# Patient Record
Sex: Female | Born: 1980 | Race: Black or African American | Hispanic: No | Marital: Married | Smoking: Never smoker
Health system: Southern US, Community
[De-identification: ages and names within clinical notes are randomized; demographics above are authoritative.]

## PROBLEM LIST (undated history)

## (undated) DIAGNOSIS — I10 Essential (primary) hypertension: Secondary | ICD-10-CM

## (undated) DIAGNOSIS — Z6841 Body Mass Index (BMI) 40.0 and over, adult: Secondary | ICD-10-CM

## (undated) DIAGNOSIS — Z8659 Personal history of other mental and behavioral disorders: Secondary | ICD-10-CM

## (undated) DIAGNOSIS — C8590 Non-Hodgkin lymphoma, unspecified, unspecified site: Secondary | ICD-10-CM

## (undated) DIAGNOSIS — T7840XA Allergy, unspecified, initial encounter: Secondary | ICD-10-CM

## (undated) DIAGNOSIS — Z789 Other specified health status: Secondary | ICD-10-CM

## (undated) DIAGNOSIS — D649 Anemia, unspecified: Secondary | ICD-10-CM

## (undated) DIAGNOSIS — F419 Anxiety disorder, unspecified: Secondary | ICD-10-CM

## (undated) DIAGNOSIS — R590 Localized enlarged lymph nodes: Secondary | ICD-10-CM

## (undated) DIAGNOSIS — R0989 Other specified symptoms and signs involving the circulatory and respiratory systems: Secondary | ICD-10-CM

## (undated) DIAGNOSIS — J302 Other seasonal allergic rhinitis: Secondary | ICD-10-CM

## (undated) DIAGNOSIS — F32A Depression, unspecified: Secondary | ICD-10-CM

## (undated) DIAGNOSIS — Z8489 Family history of other specified conditions: Secondary | ICD-10-CM

## (undated) HISTORY — DX: Depression, unspecified: F32.A

## (undated) HISTORY — DX: Personal history of other mental and behavioral disorders: Z86.59

## (undated) HISTORY — DX: Essential (primary) hypertension: I10

## (undated) HISTORY — DX: Anxiety disorder, unspecified: F41.9

## (undated) HISTORY — PX: TUBAL LIGATION: SHX77

## (undated) HISTORY — DX: Allergy, unspecified, initial encounter: T78.40XA

## (undated) HISTORY — DX: Anemia, unspecified: D64.9

---

## 2003-03-16 ENCOUNTER — Emergency Department (HOSPITAL_COMMUNITY): Admission: EM | Admit: 2003-03-16 | Discharge: 2003-03-16 | Payer: Self-pay | Admitting: Emergency Medicine

## 2003-03-16 ENCOUNTER — Encounter: Payer: Self-pay | Admitting: Emergency Medicine

## 2004-01-17 ENCOUNTER — Emergency Department (HOSPITAL_COMMUNITY): Admission: EM | Admit: 2004-01-17 | Discharge: 2004-01-17 | Payer: Self-pay | Admitting: Emergency Medicine

## 2004-04-11 ENCOUNTER — Other Ambulatory Visit: Admission: RE | Admit: 2004-04-11 | Discharge: 2004-04-11 | Payer: Self-pay | Admitting: Obstetrics and Gynecology

## 2004-05-04 ENCOUNTER — Ambulatory Visit (HOSPITAL_COMMUNITY): Admission: RE | Admit: 2004-05-04 | Discharge: 2004-05-04 | Payer: Self-pay | Admitting: Obstetrics and Gynecology

## 2004-06-09 ENCOUNTER — Inpatient Hospital Stay (HOSPITAL_COMMUNITY): Admission: AD | Admit: 2004-06-09 | Discharge: 2004-06-09 | Payer: Self-pay | Admitting: Obstetrics and Gynecology

## 2004-07-06 ENCOUNTER — Inpatient Hospital Stay (HOSPITAL_COMMUNITY): Admission: AD | Admit: 2004-07-06 | Discharge: 2004-07-06 | Payer: Self-pay | Admitting: Obstetrics and Gynecology

## 2004-08-03 ENCOUNTER — Inpatient Hospital Stay (HOSPITAL_COMMUNITY): Admission: AD | Admit: 2004-08-03 | Discharge: 2004-08-03 | Payer: Self-pay | Admitting: Obstetrics and Gynecology

## 2004-08-15 ENCOUNTER — Inpatient Hospital Stay (HOSPITAL_COMMUNITY): Admission: AD | Admit: 2004-08-15 | Discharge: 2004-08-17 | Payer: Self-pay | Admitting: Obstetrics and Gynecology

## 2004-09-13 ENCOUNTER — Other Ambulatory Visit: Admission: RE | Admit: 2004-09-13 | Discharge: 2004-09-13 | Payer: Self-pay | Admitting: Obstetrics and Gynecology

## 2004-11-30 ENCOUNTER — Emergency Department (HOSPITAL_COMMUNITY): Admission: EM | Admit: 2004-11-30 | Discharge: 2004-11-30 | Payer: Self-pay | Admitting: Emergency Medicine

## 2005-04-13 ENCOUNTER — Inpatient Hospital Stay (HOSPITAL_COMMUNITY): Admission: AD | Admit: 2005-04-13 | Discharge: 2005-04-14 | Payer: Self-pay | Admitting: Family Medicine

## 2005-08-02 ENCOUNTER — Other Ambulatory Visit: Admission: RE | Admit: 2005-08-02 | Discharge: 2005-08-02 | Payer: Self-pay | Admitting: Obstetrics and Gynecology

## 2005-11-16 ENCOUNTER — Ambulatory Visit (HOSPITAL_COMMUNITY): Admission: RE | Admit: 2005-11-16 | Discharge: 2005-11-16 | Payer: Self-pay | Admitting: Obstetrics and Gynecology

## 2006-08-12 ENCOUNTER — Ambulatory Visit (HOSPITAL_BASED_OUTPATIENT_CLINIC_OR_DEPARTMENT_OTHER): Admission: RE | Admit: 2006-08-12 | Discharge: 2006-08-12 | Payer: Self-pay | Admitting: Obstetrics and Gynecology

## 2006-10-11 ENCOUNTER — Other Ambulatory Visit: Admission: RE | Admit: 2006-10-11 | Discharge: 2006-10-11 | Payer: Self-pay | Admitting: Obstetrics & Gynecology

## 2007-10-14 ENCOUNTER — Other Ambulatory Visit: Admission: RE | Admit: 2007-10-14 | Discharge: 2007-10-14 | Payer: Self-pay | Admitting: Obstetrics and Gynecology

## 2008-10-13 ENCOUNTER — Other Ambulatory Visit: Admission: RE | Admit: 2008-10-13 | Discharge: 2008-10-13 | Payer: Self-pay | Admitting: Obstetrics and Gynecology

## 2010-11-10 NOTE — Op Note (Signed)
NAME:  Bonnie Larsen, GUNNELLS NO.:  [id number]   MEDICAL RECORD NO.:  [id number]          PATIENT TYPE:  AMB   LOCATION:  [facility]                         FACILITY:  [facility]   PHYSICIAN:  Cynthia P. Romine, M.D.DATE OF BIRTH:  15-Aug-1980   DATE OF PROCEDURE:  08/12/2006  DATE OF DISCHARGE:                               OPERATIVE REPORT   PREOPERATIVE DIAGNOSIS:  Desires attempt at permanent surgical  sterilization.   POSTOPERATIVE DIAGNOSIS:  Desires attempt at permanent surgical  sterilization.   PROCEDURE:  Falope ring laparoscopic tubal sterilization procedure.   SURGEON:  Dr. Meredeth Ide   ANESTHESIA:  General endotracheal.   ESTIMATED BLOOD LOSS:  Minimal.   COMPLICATIONS:  None.   PROCEDURE:  The patient was taken to the operating room and after the  induction of adequate general endotracheal anesthesia, was placed in the  low dorsal lithotomy position and prepped and draped in the usual  fashion.  A Hulka uterine manipulator was placed.  The bladder was  drained with a red rubber catheter.  Attention was next turned to the  abdomen.  A small subumbilical incision was made; a Veress needle was  inserted in the peritoneal space.  Proper placement was tested by noting  a negative aspirate and free flow of the Veress needle again with a  negative aspirate and then by noting the response of saline placed at  the hub of the Veress needle's negative pressure as the abdominal wall  was elevated.  Pneumoperitoneum was created with the automatic  insufflator to 2 liters of CO2.  A disposable 10/11 mm trocar was then  inserted and its proper placement noted with the laparoscope.  The  pelvic anatomy was inspected.  The uterus was of a normal size, shape  and contour.  The ovaries and tubes were freely mobile and were normal.  The midline suprapubic 8 mm incision was made and an 8 mm trocar was  inserted under direct visualization and the Falope ring applier was  introduced.  The Falope ring was placed on the patient's right fallopian  tube after having elevated it and tracing it to its fimbriated end and  as this portion was elevated, a Falope ring was placed.  A good knuckle  of tube was noted to be contained within the ring.  No bleeding was  encountered.  The procedure was repeated on the patient's left,  identifying the tube and tracing it to its fimbriated end.  A management  portion was elevated and again a Falope ring was placed.  A good knuckle  of tube was noted to be contained within the ring and no bleeding was  noted.  Photographic documentation was taken.  The applier was removed  from the abdomen.  The sleeves were removed.  The pneumoperitoneum was  allowed to escape.  The  incisions were closed subcuticularly with 4-0 Vicryl Rapide and  Dermabond was placed on the skin, instruments removed from the vagina,  and the procedure was terminated.  The patient tolerated it well and  went in satisfactory condition to postanesthesia recovery.  Cynthia P. Romine, M.D.  Electronically Signed     CPR/MEDQ  D:  08/12/2006  T:  08/12/2006  Job:  [id number]

## 2011-05-06 ENCOUNTER — Emergency Department (INDEPENDENT_AMBULATORY_CARE_PROVIDER_SITE_OTHER)
Admission: EM | Admit: 2011-05-06 | Discharge: 2011-05-06 | Disposition: A | Discharge status: Home / Self Care | Payer: 59 | Source: Home / Self Care | Attending: Family Medicine | Admitting: Family Medicine

## 2011-05-06 ENCOUNTER — Encounter: Payer: Self-pay | Admitting: *Deleted

## 2011-05-06 DIAGNOSIS — J069 Acute upper respiratory infection, unspecified: Secondary | ICD-10-CM

## 2011-05-06 DIAGNOSIS — J310 Chronic rhinitis: Secondary | ICD-10-CM

## 2011-05-06 NOTE — ED Provider Notes (Signed)
History     CSN: [id number] Arrival date & time: 05/06/2011  7:01 PM   First MD Initiated Contact with Patient 05/06/11 1805      Chief Complaint  Patient presents with  . Nasal Congestion    pt with onset of sinus congestion and cough x 2 weeks seen and treated own md thursday - treated for sinus infection and bronchitis - taking meds as directed friday night onset of neck and back pain   . Cough  . Neck Pain  . Back Pain    (Consider location/radiation/quality/duration/timing/severity/associated sxs/prior treatment) Patient is a 30 y.o. female presenting with cough, neck pain, and back pain. The history is provided by the patient.  Cough This is a new problem. The current episode started more than 2 days ago. The problem occurs constantly. The cough is productive of sputum. Associated symptoms include chest pain, rhinorrhea, myalgias and shortness of breath. She has tried an opioid for the symptoms. The treatment provided mild relief.  Neck Pain  Associated symptoms include chest pain.  Back Pain  Associated symptoms include chest pain.    No past medical history on file.  No past surgical history on file.  No family history on file.  History  Substance Use Topics  . Smoking status: Not on file  . Smokeless tobacco: Not on file  . Alcohol Use: Not on file    OB History    Grav Para Term Preterm Abortions TAB SAB Ect Mult Living                  Review of Systems  HENT: Positive for rhinorrhea and neck pain.   Respiratory: Positive for cough and shortness of breath.   Cardiovascular: Positive for chest pain.  Gastrointestinal: Negative.   Genitourinary: Negative.   Musculoskeletal: Positive for myalgias, back pain and arthralgias.  Neurological: Negative.     Allergies  Review of patient's allergies indicates no known allergies.  Home Medications   Current Outpatient Rx  Name Route Sig Dispense Refill  . ALBUTEROL SULFATE HFA 108 (90 BASE) MCG/ACT IN  AERS Inhalation Inhale 2 puffs into the lungs every 6 (six) hours as needed.      . AMOXICILLIN 500 MG PO TABS Oral Take 500 mg by mouth 2 (two) times daily.        BP 117/78  Pulse 113  Temp(Src) 99.1 F (37.3 C) (Oral)  Resp 19  SpO2 99%  Physical Exam  Constitutional: She appears well-developed and well-nourished.  HENT:  Head: Normocephalic and atraumatic.  Right Ear: Tympanic membrane normal.  Left Ear: Tympanic membrane normal.  Mouth/Throat: Oropharynx is clear and moist and mucous membranes are normal.  Eyes: EOM are normal.  Cardiovascular: Normal rate and regular rhythm.   Pulmonary/Chest: Effort normal and breath sounds normal.    ED Course  Procedures (including critical care time)  Labs Reviewed - No data to display No results found.   No diagnosis found.    MDM          Richardo Priest, MD 05/06/11 2005

## 2012-05-29 ENCOUNTER — Emergency Department (HOSPITAL_COMMUNITY): Payer: 59

## 2012-05-29 ENCOUNTER — Encounter (HOSPITAL_COMMUNITY): Payer: Self-pay

## 2012-05-29 ENCOUNTER — Emergency Department (HOSPITAL_COMMUNITY)
Admission: EM | Admit: 2012-05-29 | Discharge: 2012-05-29 | Disposition: A | Discharge status: Home / Self Care | Payer: 59 | Attending: Emergency Medicine | Admitting: Emergency Medicine

## 2012-05-29 DIAGNOSIS — L0201 Cutaneous abscess of face: Secondary | ICD-10-CM | POA: Insufficient documentation

## 2012-05-29 DIAGNOSIS — T7840XA Allergy, unspecified, initial encounter: Secondary | ICD-10-CM

## 2012-05-29 DIAGNOSIS — L0390 Cellulitis, unspecified: Secondary | ICD-10-CM

## 2012-05-29 DIAGNOSIS — K047 Periapical abscess without sinus: Secondary | ICD-10-CM

## 2012-05-29 DIAGNOSIS — L03211 Cellulitis of face: Secondary | ICD-10-CM | POA: Insufficient documentation

## 2012-05-29 DIAGNOSIS — I10 Essential (primary) hypertension: Secondary | ICD-10-CM | POA: Insufficient documentation

## 2012-05-29 LAB — POCT I-STAT, CHEM 8
BUN: 10 mg/dL (ref 6–23)
Calcium, Ion: 1.19 mmol/L (ref 1.12–1.23)
Chloride: 102 mEq/L (ref 96–112)
Creatinine, Ser: 1.2 mg/dL — ABNORMAL HIGH (ref 0.50–1.10)
Glucose, Bld: 92 mg/dL (ref 70–99)
HCT: 48 % — ABNORMAL HIGH (ref 36.0–46.0)
Hemoglobin: 16.3 g/dL — ABNORMAL HIGH (ref 12.0–15.0)
Potassium: 3.7 mEq/L (ref 3.5–5.1)
Sodium: 140 mEq/L (ref 135–145)
TCO2: 26 mmol/L (ref 0–100)

## 2012-05-29 MED ORDER — DIPHENHYDRAMINE HCL 50 MG/ML IJ SOLN
50.0000 mg | Freq: Once | INTRAMUSCULAR | Status: AC
  Administered 2012-05-29: 50 mg via INTRAVENOUS
  Filled 2012-05-29: qty 1

## 2012-05-29 MED ORDER — EPINEPHRINE 0.3 MG/0.3ML IJ DEVI
0.3000 mg | Freq: Once | INTRAMUSCULAR | Status: AC
  Administered 2012-05-29: 0.3 mg via INTRAMUSCULAR
  Filled 2012-05-29: qty 0.3

## 2012-05-29 MED ORDER — FAMOTIDINE 20 MG PO TABS
20.0000 mg | ORAL_TABLET | Freq: Two times a day (BID) | ORAL | Status: DC
Start: 2012-05-29 — End: 2015-09-20

## 2012-05-29 MED ORDER — PREDNISONE 20 MG PO TABS: 40.0000 mg | ORAL_TABLET | Freq: Every day | ORAL | Status: DC

## 2012-05-29 MED ORDER — SODIUM CHLORIDE 0.9 % IV SOLN
Freq: Once | INTRAVENOUS | Status: AC
  Administered 2012-05-29: 14:00:00 via INTRAVENOUS

## 2012-05-29 MED ORDER — LORAZEPAM 2 MG/ML IJ SOLN
1.0000 mg | Freq: Once | INTRAMUSCULAR | Status: AC
  Administered 2012-05-29: 0.5 mg via INTRAVENOUS
  Filled 2012-05-29: qty 1

## 2012-05-29 MED ORDER — DIPHENHYDRAMINE HCL 50 MG/ML IJ SOLN
25.0000 mg | Freq: Once | INTRAMUSCULAR | Status: AC
  Administered 2012-05-29: 25 mg via INTRAVENOUS
  Filled 2012-05-29: qty 1

## 2012-05-29 MED ORDER — CLINDAMYCIN PHOSPHATE 900 MG/50ML IV SOLN
900.0000 mg | Freq: Once | INTRAVENOUS | Status: AC
  Administered 2012-05-29: 900 mg via INTRAVENOUS
  Filled 2012-05-29: qty 50

## 2012-05-29 MED ORDER — METHYLPREDNISOLONE SODIUM SUCC 125 MG IJ SOLR
125.0000 mg | Freq: Once | INTRAMUSCULAR | Status: AC
  Administered 2012-05-29: 125 mg via INTRAVENOUS
  Filled 2012-05-29: qty 2

## 2012-05-29 MED ORDER — IOHEXOL 300 MG/ML  SOLN
80.0000 mL | Freq: Once | INTRAMUSCULAR | Status: AC | PRN
  Administered 2012-05-29: 80 mL via INTRAVENOUS

## 2012-05-29 MED ORDER — CLINDAMYCIN HCL 150 MG PO CAPS: 150.0000 mg | ORAL_CAPSULE | Freq: Four times a day (QID) | ORAL | Status: DC

## 2012-05-29 MED ORDER — FAMOTIDINE IN NACL 20-0.9 MG/50ML-% IV SOLN
20.0000 mg | Freq: Once | INTRAVENOUS | Status: AC
  Administered 2012-05-29: 20 mg via INTRAVENOUS
  Filled 2012-05-29: qty 50

## 2012-05-29 MED ORDER — AMOXICILLIN-POT CLAVULANATE 875-125 MG PO TABS: 1.0000 | ORAL_TABLET | Freq: Two times a day (BID) | ORAL | Status: DC

## 2012-05-29 MED ORDER — FENTANYL CITRATE 0.05 MG/ML IJ SOLN
50.0000 ug | Freq: Once | INTRAMUSCULAR | Status: AC
  Administered 2012-05-29: 50 ug via INTRAVENOUS
  Filled 2012-05-29: qty 2

## 2012-05-29 MED ORDER — MORPHINE SULFATE 4 MG/ML IJ SOLN
4.0000 mg | Freq: Once | INTRAMUSCULAR | Status: AC
  Administered 2012-05-29: 4 mg via INTRAVENOUS
  Filled 2012-05-29: qty 1

## 2012-05-29 MED ORDER — HYDROCODONE-ACETAMINOPHEN 5-325 MG PO TABS: ORAL_TABLET | ORAL | Status: DC

## 2012-05-29 MED ORDER — DIPHENHYDRAMINE HCL 25 MG PO TABS: 25.0000 mg | ORAL_TABLET | Freq: Four times a day (QID) | ORAL | Status: DC | PRN

## 2012-05-29 MED ORDER — EPINEPHRINE 0.3 MG/0.3ML IJ DEVI
INTRAMUSCULAR | Status: AC
  Filled 2012-05-29: qty 0.3

## 2012-05-29 NOTE — ED Notes (Signed)
Facial swelling has come down significantly and pt is sleeping comfortably. Family at bedside.

## 2012-05-29 NOTE — ED Provider Notes (Signed)
Pt placed in CDU awaiting a consult with ENT (Dr. Maurice March) for dental/facial abscess verified on CT scan.  Plan: Establish an appointment for her with Dr Maurice March at ENT and then d/c home with clinda and pain meds.  BP 116/76  Pulse 107  Temp 99.1 F (37.3 C) (Oral)  Resp 18  SpO2 100%  LMP 05/22/2012   On exam: hemodynamically stable, NAD, heart w/ RRR, lungs CTAB, Chest & abd non-tender, no peripheral edema or calf tenderness.  Pt resting comfortably at this time.   5:35 PM Attempted to contact Dr Bonnetta Barry office for a f/u appointment, but the office is closed.  I discussed this with the patient and the importance of her prompt F/U.     6:55 PM Called to patient room with concerns for allergic reaction.  Pt with significant bilateral facial edema, mild erythema of the skin, c/o itching and throat closing.  Pt also with h/o anxiety attacks. Lungs CTA bilaterally, not hypotensive, tachycardic to 130.  Pt given Epi 0.3mg  SQ, bendaryl 50mg  IV, Solumedrol 125mg  IV, Pepcid 20mg  IV.  Pt with decreasing symptoms.  Pt placed on oxygen 2L via [place]; oxygen saturations remained at 100% throughout event.     7:05 PM  Pt with significant anxiety throughout event and persisting after swelling of the face began to resolve.  Pt given Ativan 0.5 mg.  BP prior to Ativan administration 133/79, HR 125, O2 100% on 2L [place].     Swelling of the face resolving.  Pt reports significant decrease in her throat swelling.     10:09 PM Patient resting comfortably without evidence of return allergic reaction.  We'll contact Dr. Maurice March will not evaluate in the ED, but will have the pt follow-up with in the office tomorrow.  We will hold her for the 4 hours of observation after the allergic reaction and then d/c home.    11:13 PM Patient with mild swelling returning around her eyes however no swelling of the lips, other areas of the face or throat. Patient states she is breathing without difficulty and swallowing without  difficulty. Patient satting well, alert and oriented, NAD, maintaining her secretions.  I discussed the importance of contacting Dr. Maurice March at 8 AM to make an office appointment. Patient states understanding.  Will DC home with amoxicillin, Benadryl, Pepcid, prednisone. Return precautions for allergic reaction anaphylaxis reviewed with the patient.  1. Medications: Amoxicillin, Benadryl, Pepcid, prednisone, usual home medications 2. Treatment: rest, drink plenty of fluids, take medications as prescribed 3. Follow Up: Please followup with your primary doctor for discussion of your diagnoses and further evaluation after today's visit; if you do not have a primary care doctor use the resource guide provided to find one; followup with Dr. Maurice March at 8 AM tomorrow morning   Dierdre Forth, PA-C 05/29/12 2319

## 2012-05-29 NOTE — ED Notes (Signed)
Pt presents with R sided facial swelling that she noted when she awoke this morning.  Pt reports feeling pain to R upper tooth last night while chewing last night.  Pt reports "a little" pain with swallowing that she reports began today, denies any difficulty swallowing or tongue swelling.

## 2012-05-29 NOTE — ED Notes (Signed)
Pt had edematous face after clindomycin infusion completed, infusion stopped and PA notified. Pt A&Ox4, 100% ra, placed on 2L n/c, patent airway.

## 2012-05-29 NOTE — ED Notes (Signed)
The patient is AOx4 and comfortable with her discharge instructions. 

## 2012-05-29 NOTE — ED Notes (Signed)
Pt c/o 9/10 right maxilla pain, resting, ambulatory, nad.

## 2012-05-29 NOTE — ED Provider Notes (Signed)
History   This chart was scribed for Gavin Pound. Oletta Lamas, MD by Sofie Rower, ED Scribe. The patient was seen in room TR10C/TR10C and the patient's care was started at 1:15PM.     CSN: [id number]  Arrival date & time 05/29/12  1218   First MD Initiated Contact with Patient 05/29/12 1315      No chief complaint on file.   (Consider location/radiation/quality/duration/timing/severity/associated sxs/prior treatment) HPI  Bonnie Larsen is a 31 y.o. female who presents to the Emergency Department complaining of sudden, progressively worsening, facial swelling, located at the right side of the face, onset today (05/29/12). The pt reports she experienced a slight painful sensation within her right upper jaw while chewing yesterday evening, however, she did not feel as if the sensation was severe enough to seek medical attention. The pt reports she awoke this morning, experiencing a right sided facial pain, accompanied by facial swelling, which prompted her concern and desire for medical evaluation at [facility] today.  The pt denies difficulty swallowing, rhinorrhea, nasal congestion, and dental pain.  The pt does not smoke, however, she does drink alcohol.      History reviewed. No pertinent past medical history.  History reviewed. No pertinent past surgical history.  History reviewed. No pertinent family history.  History  Substance Use Topics  . Smoking status: Never Smoker   . Smokeless tobacco: Not on file  . Alcohol Use: Yes    OB History    Grav Para Term Preterm Abortions TAB SAB Ect Mult Living                  Review of Systems  Constitutional: Negative for fever and chills.  HENT: Positive for facial swelling. Negative for congestion, rhinorrhea, trouble swallowing, dental problem and voice change.     Allergies  Review of patient's allergies indicates no known allergies.  Home Medications   Current Outpatient Rx  Name  Route  Sig  Dispense  Refill  . ALBUTEROL SULFATE  HFA 108 (90 BASE) MCG/ACT IN AERS   Inhalation   Inhale 2 puffs into the lungs every 6 (six) hours as needed. For shortness of breath         . CLINDAMYCIN HCL 150 MG PO CAPS   Oral   Take 1 capsule (150 mg total) by mouth 4 (four) times daily.   28 capsule   0   . HYDROCODONE-ACETAMINOPHEN 5-325 MG PO TABS      1-2 tablets po q 6 hours prn moderate to severe pain   20 tablet   0     BP 126/78  Pulse 105  Temp 99.1 F (37.3 C) (Oral)  Resp 18  SpO2 97%  LMP 05/22/2012  Physical Exam  Nursing note and vitals reviewed. Constitutional: She appears well-developed and well-nourished. No distress.  HENT:  Head: Atraumatic.  Right Ear: Tympanic membrane and ear canal normal.  Left Ear: Tympanic membrane and ear canal normal.  Nose: Nose normal.  Mouth/Throat: Uvula is midline. No posterior oropharyngeal erythema.       Facial swelling under right eye and right cheek area. Jaw line normal.   Eyes: EOM are normal.  Neck: Normal range of motion. Neck supple.  Pulmonary/Chest: Effort normal.  Lymphadenopathy:    She has no cervical adenopathy.  Neurological: She is alert.  Skin: Skin is warm and dry. She is not diaphoretic.    ED Course  Procedures (including critical care time)  DIAGNOSTIC STUDIES: Oxygen Saturation is 97% on  room air, normal by my interpretation.    COORDINATION OF CARE:   1:21 PM- Treatment plan concerning possible cellulitis or dental abscess discussed with patient. Pt agrees with treatment.    Results for orders placed during the hospital encounter of 05/29/12  POCT I-STAT, CHEM 8      Component Value Range   Sodium 140  135 - 145 mEq/L   Potassium 3.7  3.5 - 5.1 mEq/L   Chloride 102  96 - 112 mEq/L   BUN 10  6 - 23 mg/dL   Creatinine, Ser 1.61 (*) 0.50 - 1.10 mg/dL   Glucose, Bld 92  70 - 99 mg/dL   Calcium, Ion 0.96  0.45 - 1.23 mmol/L   TCO2 26  0 - 100 mmol/L   Hemoglobin 16.3 (*) 12.0 - 15.0 g/dL   HCT 40.9 (*) 81.1 - 91.4 %    Ct Maxillofacial W/cm  05/29/2012  *RADIOLOGY REPORT*  Clinical Data: Right facial pain and swelling.  CT MAXILLOFACIAL WITH CONTRAST  Technique:  Multidetector CT imaging of the maxillofacial structures was performed with intravenous contrast. Multiplanar CT image reconstructions were also generated.  Contrast: 80mL OMNIPAQUE IOHEXOL 300 MG/ML  SOLN, 80mL OMNIPAQUE IOHEXOL 300 MG/ML  SOLN  Comparison: None.  Findings: There is a tiny abscess measuring 4 x 7 mm adjacent to the right side of the mandible adjacent to the tip of the lateral root of tooth number five.  There is a cavity in that tooth.  There is cellulitis in the overlying soft tissues of the right cheek. There is no abscess within maxilla.  The overlying the maxillary sinus is normal.  The patient has prominent cavity and tooth number two.  There are bilateral impacted mandibular and maxillary third molars.  IMPRESSION: Small abscess adjacent to the maxilla just lateral to the tip of the lateral root of tooth #5.  There is a cavity in tooth #5.  No intraosseous abscess.  Adjacent cellulitis in the right cheek.   Original Report Authenticated By: Francene Boyers, M.D.       1. Dental abscess   2. Cellulitis     Pt moved to CDU holding to complete abx.    MDM  I personally performed the services described in this documentation, which was scribed in my presence. The recorded information has been reviewed and is accurate.   Differential included dental abscess, cellulitis, or parotiditis.  CT shows there is a dental abscess and overlying cellulitis.  Pt given dose of IV clindamycin, will need RX for same.  I spoke to Dr. Maurice March with oral surgery who can see pt in follow up.    Gavin Pound. Cinthya Bors, MD 05/29/12 1614

## 2012-05-29 NOTE — ED Notes (Signed)
Called Dr. Bonnetta Barry office for a follow-up appt, but office was closed.

## 2012-05-31 NOTE — ED Provider Notes (Signed)
Medical screening examination/treatment/procedure(s) were conducted as a shared visit with non-physician practitioner(s) and myself.  I personally evaluated the patient during the encounter   [name] Y. Ksenia Kunz, MD 05/31/12 1340 

## 2014-03-13 ENCOUNTER — Emergency Department (HOSPITAL_COMMUNITY)
Admission: EM | Admit: 2014-03-13 | Discharge: 2014-03-13 | Disposition: A | Discharge status: Home / Self Care | Payer: 59 | Attending: Emergency Medicine | Admitting: Emergency Medicine

## 2014-03-13 ENCOUNTER — Encounter (HOSPITAL_COMMUNITY): Payer: Self-pay | Admitting: Emergency Medicine

## 2014-03-13 DIAGNOSIS — N390 Urinary tract infection, site not specified: Secondary | ICD-10-CM | POA: Insufficient documentation

## 2014-03-13 DIAGNOSIS — Z3202 Encounter for pregnancy test, result negative: Secondary | ICD-10-CM | POA: Insufficient documentation

## 2014-03-13 DIAGNOSIS — N949 Unspecified condition associated with female genital organs and menstrual cycle: Secondary | ICD-10-CM | POA: Insufficient documentation

## 2014-03-13 DIAGNOSIS — Z792 Long term (current) use of antibiotics: Secondary | ICD-10-CM | POA: Insufficient documentation

## 2014-03-13 DIAGNOSIS — IMO0002 Reserved for concepts with insufficient information to code with codable children: Secondary | ICD-10-CM | POA: Insufficient documentation

## 2014-03-13 DIAGNOSIS — Z79899 Other long term (current) drug therapy: Secondary | ICD-10-CM | POA: Insufficient documentation

## 2014-03-13 LAB — URINALYSIS, ROUTINE W REFLEX MICROSCOPIC
Glucose, UA: NEGATIVE mg/dL
Ketones, ur: 15 mg/dL — AB
NITRITE: POSITIVE — AB
Protein, ur: 100 mg/dL — AB
SPECIFIC GRAVITY, URINE: 1.028 (ref 1.005–1.030)
UROBILINOGEN UA: 4 mg/dL — AB (ref 0.0–1.0)
pH: 5 (ref 5.0–8.0)

## 2014-03-13 LAB — URINE MICROSCOPIC-ADD ON

## 2014-03-13 LAB — PREGNANCY, URINE: PREG TEST UR: NEGATIVE

## 2014-03-13 MED ORDER — CEPHALEXIN 500 MG PO CAPS: 500.0000 mg | ORAL_CAPSULE | Freq: Four times a day (QID) | ORAL | Status: DC

## 2014-03-13 NOTE — Discharge Instructions (Signed)
Take antibiotic to completion.  Urinary Tract Infection Urinary tract infections (UTIs) can develop anywhere along your urinary tract. Your urinary tract is your body's drainage system for removing wastes and extra water. Your urinary tract includes two kidneys, two ureters, a bladder, and a urethra. Your kidneys are a pair of bean-shaped organs. Each kidney is about the size of your fist. They are located below your ribs, one on each side of your spine. CAUSES Infections are caused by microbes, which are microscopic organisms, including fungi, viruses, and bacteria. These organisms are so small that they can only be seen through a microscope. Bacteria are the microbes that most commonly cause UTIs. SYMPTOMS  Symptoms of UTIs may vary by age and gender of the patient and by the location of the infection. Symptoms in young women typically include a frequent and intense urge to urinate and a painful, burning feeling in the bladder or urethra during urination. Older women and men are more likely to be tired, shaky, and weak and have muscle aches and abdominal pain. A fever may mean the infection is in your kidneys. Other symptoms of a kidney infection include pain in your back or sides below the ribs, nausea, and vomiting. DIAGNOSIS To diagnose a UTI, your caregiver will ask you about your symptoms. Your caregiver also will ask to provide a urine sample. The urine sample will be tested for bacteria and white blood cells. White blood cells are made by your body to help fight infection. TREATMENT  Typically, UTIs can be treated with medication. Because most UTIs are caused by a bacterial infection, they usually can be treated with the use of antibiotics. The choice of antibiotic and length of treatment depend on your symptoms and the type of bacteria causing your infection. HOME CARE INSTRUCTIONS  If you were prescribed antibiotics, take them exactly as your caregiver instructs you. Finish the medication  even if you feel better after you have only taken some of the medication.  Drink enough water and fluids to keep your urine clear or pale yellow.  Avoid caffeine, tea, and carbonated beverages. They tend to irritate your bladder.  Empty your bladder often. Avoid holding urine for long periods of time.  Empty your bladder before and after sexual intercourse.  After a bowel movement, women should cleanse from front to back. Use each tissue only once. SEEK MEDICAL CARE IF:   You have back pain.  You develop a fever.  Your symptoms do not begin to resolve within 3 days. SEEK IMMEDIATE MEDICAL CARE IF:   You have severe back pain or lower abdominal pain.  You develop chills.  You have nausea or vomiting.  You have continued burning or discomfort with urination. MAKE SURE YOU:   Understand these instructions.  Will watch your condition.  Will get help right away if you are not doing well or get worse. Document Released: 03/21/2005 Document Revised: 12/11/2011 Document Reviewed: 07/20/2011 [facility] Patient Information 2015 [place], [place]. This information is not intended to replace advice given to you by your health care provider. Make sure you discuss any questions you have with your health care provider.

## 2014-03-13 NOTE — ED Provider Notes (Signed)
CSN: [id number]     Arrival date & time 03/13/14  1851 History   First MD Initiated Contact with Patient 03/13/14 2013     Chief Complaint  Patient presents with  . Pelvic Pain     (Consider location/radiation/quality/duration/timing/severity/associated sxs/prior Treatment) HPI Comments: Pt is a 33 y/o healthy female who presents to the ED complaining of suprapubic pressure and end urination dysuria x5 days. Admits to increased urinary frequency and urgency. Denies fever, chills, nausea, vomiting or back pain. States this feels like her prior UTI. Denies vaginal bleeding or discharge. Last menstrual period began 1 month ago and was normal. States her periods are usually adequate normal. She is not on birth control.  Patient is a 33 y.o. female presenting with pelvic pain. The history is provided by the patient.  Pelvic Pain Associated symptoms include abdominal pain.    History reviewed. No pertinent past medical history. History reviewed. No pertinent past surgical history. No family history on file. History  Substance Use Topics  . Smoking status: Never Smoker   . Smokeless tobacco: Not on file  . Alcohol Use: Yes   OB History   Grav Para Term Preterm Abortions TAB SAB Ect Mult Living                 Review of Systems  Gastrointestinal: Positive for abdominal pain.  Genitourinary: Positive for dysuria, urgency and frequency.  All other systems reviewed and are negative.     Allergies  Clindamycin/lincomycin  Home Medications   Prior to Admission medications   Medication Sig Start Date End Date Taking? Authorizing Provider  albuterol (PROVENTIL HFA;VENTOLIN HFA) 108 (90 BASE) MCG/ACT inhaler Inhale 2 puffs into the lungs every 6 (six) hours as needed. For shortness of breath    Historical Provider, MD  amoxicillin-clavulanate (AUGMENTIN) 875-125 MG per tablet Take 1 tablet by mouth every 12 (twelve) hours. 05/29/12   Hannah Muthersbaugh, PA-C  cephALEXin (KEFLEX) 500  MG capsule Take 1 capsule (500 mg total) by mouth 4 (four) times daily. 03/13/14   Illene Labrador, PA-C  clindamycin (CLEOCIN) 150 MG capsule Take 1 capsule (150 mg total) by mouth 4 (four) times daily. 05/29/12   Saddie Benders. Ghim, MD  diphenhydrAMINE (BENADRYL) 25 MG tablet Take 1 tablet (25 mg total) by mouth every 6 (six) hours as needed for itching (Rash). 05/29/12   Hannah Muthersbaugh, PA-C  famotidine (PEPCID) 20 MG tablet Take 1 tablet (20 mg total) by mouth 2 (two) times daily. 05/29/12   Hannah Muthersbaugh, PA-C  HYDROcodone-acetaminophen (NORCO/VICODIN) 5-325 MG per tablet 1-2 tablets po q 6 hours prn moderate to severe pain 05/29/12   Saddie Benders. Ghim, MD  predniSONE (DELTASONE) 20 MG tablet Take 2 tablets (40 mg total) by mouth daily. 05/29/12   Hannah Muthersbaugh, PA-C   BP 126/66  Pulse 97  Temp(Src) 98.2 F (36.8 C) (Oral)  Resp 16  Ht 5\' 3"  (1.6 m)  Wt 225 lb (102.059 kg)  BMI 39.87 kg/m2  SpO2 99%  LMP 02/06/2014 Physical Exam  Nursing note and vitals reviewed. Constitutional: She is oriented to person, place, and time. She appears well-developed and well-nourished. No distress.  HENT:  Head: Normocephalic and atraumatic.  Mouth/Throat: Oropharynx is clear and moist.  Eyes: Conjunctivae are normal.  Neck: Normal range of motion. Neck supple.  Cardiovascular: Normal rate, regular rhythm and normal heart sounds.   Pulmonary/Chest: Effort normal and breath sounds normal.  Abdominal: Soft. Bowel sounds are normal. There is tenderness.  Mild suprapubic tenderness. No rigidity, rebound or guarding. No peritoneal signs. No CVA tenderness.  Musculoskeletal: Normal range of motion. She exhibits no edema.  Neurological: She is alert and oriented to person, place, and time.  Skin: Skin is warm and dry. She is not diaphoretic.  Psychiatric: She has a normal mood and affect. Her behavior is normal.    ED Course  Procedures (including critical care time) Labs Review Labs Reviewed   URINALYSIS, ROUTINE W REFLEX MICROSCOPIC - Abnormal; Notable for the following:    Color, Urine RED (*)    Hgb urine dipstick TRACE (*)    Bilirubin Urine SMALL (*)    Ketones, ur 15 (*)    Protein, ur 100 (*)    Urobilinogen, UA 4.0 (*)    Nitrite POSITIVE (*)    Leukocytes, UA MODERATE (*)    All other components within normal limits  URINE MICROSCOPIC-ADD ON - Abnormal; Notable for the following:    Squamous Epithelial / LPF FEW (*)    Bacteria, UA FEW (*)    All other components within normal limits  PREGNANCY, URINE    Imaging Review No results found.   EKG Interpretation None      MDM   Final diagnoses:  UTI (lower urinary tract infection)   Patient is nontoxic appearing and in no apparent distress. Afebrile, vital signs stable. Urinalysis positive for infection. No CVA tenderness. Doubt pyelonephritis. Uncomplicated UTI. Treat with Keflex. Stable for discharge. Return precautions given. Patient states understanding of treatment care plan and is agreeable.  Illene Labrador, PA-C 03/13/14 2105

## 2014-03-13 NOTE — ED Notes (Signed)
Pt presents with vaginal pressure and pain with urination onset Monday afternoon. Pt denies vaginal odor, discharge or bleeding, or abd pain. Pt states she took AZO prior to arrival

## 2014-03-13 NOTE — ED Notes (Signed)
Pt presents with pelvic pressure and pain with urination onset Monday.  Denies vaginal discharge or bleeding, denies fevers or nausea.

## 2014-03-13 NOTE — ED Notes (Signed)
Pt discharged home with all belongings, pt alert, oriented and ambulatory upon discharge,. Pt escorted to exit via wheel chair by [employer]. 1 new RX prescribed, pt verbalizes understanding of discharge instructions, pt drove self home, no narcotics given in ED

## 2014-03-14 NOTE — ED Provider Notes (Signed)
Medical screening examination/treatment/procedure(s) were performed by non-physician practitioner and as supervising physician I was immediately available for consultation/collaboration.   EKG Interpretation None        Delice Bison Joanette Silveria, DO 03/14/14 [id number]

## 2014-07-27 ENCOUNTER — Other Ambulatory Visit: Payer: Self-pay | Admitting: Obstetrics and Gynecology

## 2014-07-27 DIAGNOSIS — Z803 Family history of malignant neoplasm of breast: Secondary | ICD-10-CM

## 2014-07-27 DIAGNOSIS — N644 Mastodynia: Secondary | ICD-10-CM

## 2014-08-03 ENCOUNTER — Other Ambulatory Visit: Payer: Self-pay | Admitting: Obstetrics and Gynecology

## 2014-08-03 ENCOUNTER — Ambulatory Visit
Admission: RE | Admit: 2014-08-03 | Discharge: 2014-08-03 | Disposition: A | Discharge status: Home / Self Care | Payer: 59 | Source: Ambulatory Visit | Attending: Obstetrics and Gynecology | Admitting: Obstetrics and Gynecology

## 2014-08-03 DIAGNOSIS — N644 Mastodynia: Secondary | ICD-10-CM

## 2014-08-03 DIAGNOSIS — N6310 Unspecified lump in the right breast, unspecified quadrant: Secondary | ICD-10-CM

## 2014-08-03 DIAGNOSIS — Z803 Family history of malignant neoplasm of breast: Secondary | ICD-10-CM

## 2014-08-11 ENCOUNTER — Ambulatory Visit
Admission: RE | Admit: 2014-08-11 | Discharge: 2014-08-11 | Disposition: A | Discharge status: Home / Self Care | Payer: 59 | Source: Ambulatory Visit | Attending: Obstetrics and Gynecology | Admitting: Obstetrics and Gynecology

## 2014-08-11 DIAGNOSIS — N6310 Unspecified lump in the right breast, unspecified quadrant: Secondary | ICD-10-CM

## 2014-08-16 ENCOUNTER — Other Ambulatory Visit: Payer: 59

## 2015-04-25 DIAGNOSIS — E669 Obesity, unspecified: Secondary | ICD-10-CM

## 2015-04-25 DIAGNOSIS — E66812 Obesity, class 2: Secondary | ICD-10-CM

## 2015-04-25 HISTORY — DX: Obesity, unspecified: E66.9

## 2015-04-25 HISTORY — DX: Obesity, class 2: E66.812

## 2015-09-20 ENCOUNTER — Encounter: Payer: Self-pay | Admitting: Family Medicine

## 2015-09-20 ENCOUNTER — Telehealth: Payer: Self-pay | Admitting: Obstetrics & Gynecology

## 2015-09-20 ENCOUNTER — Ambulatory Visit: Payer: Self-pay | Admitting: Family Medicine

## 2015-09-20 ENCOUNTER — Ambulatory Visit (INDEPENDENT_AMBULATORY_CARE_PROVIDER_SITE_OTHER): Payer: 59 | Admitting: Family Medicine

## 2015-09-20 VITALS — BP 120/80 | HR 80 | Wt 239.6 lb

## 2015-09-20 DIAGNOSIS — N941 Unspecified dyspareunia: Secondary | ICD-10-CM

## 2015-09-20 DIAGNOSIS — R102 Pelvic and perineal pain: Secondary | ICD-10-CM | POA: Diagnosis not present

## 2015-09-20 DIAGNOSIS — N921 Excessive and frequent menstruation with irregular cycle: Secondary | ICD-10-CM | POA: Diagnosis not present

## 2015-09-20 DIAGNOSIS — Z862 Personal history of diseases of the blood and blood-forming organs and certain disorders involving the immune mechanism: Secondary | ICD-10-CM | POA: Diagnosis not present

## 2015-09-20 DIAGNOSIS — Z86018 Personal history of other benign neoplasm: Secondary | ICD-10-CM

## 2015-09-20 DIAGNOSIS — R5383 Other fatigue: Secondary | ICD-10-CM

## 2015-09-20 DIAGNOSIS — Z8742 Personal history of other diseases of the female genital tract: Secondary | ICD-10-CM

## 2015-09-20 LAB — CBC WITH DIFFERENTIAL/PLATELET
Basophils Absolute: 0 10*3/uL (ref 0.0–0.1)
Basophils Relative: 0 % (ref 0–1)
Eosinophils Absolute: 1 10*3/uL — ABNORMAL HIGH (ref 0.0–0.7)
Eosinophils Relative: 11 % — ABNORMAL HIGH (ref 0–5)
HEMATOCRIT: 38.6 % (ref 36.0–46.0)
HEMOGLOBIN: 12.8 g/dL (ref 12.0–15.0)
LYMPHS ABS: 4.7 10*3/uL — AB (ref 0.7–4.0)
LYMPHS PCT: 51 % — AB (ref 12–46)
MCH: 25.3 pg — AB (ref 26.0–34.0)
MCHC: 33.2 g/dL (ref 30.0–36.0)
MCV: 76.3 fL — AB (ref 78.0–100.0)
MONOS PCT: 7 % (ref 3–12)
MPV: 10.4 fL (ref 8.6–12.4)
Monocytes Absolute: 0.6 10*3/uL (ref 0.1–1.0)
NEUTROS PCT: 31 % — AB (ref 43–77)
Neutro Abs: 2.9 10*3/uL (ref 1.7–7.7)
Platelets: 243 10*3/uL (ref 150–400)
RBC: 5.06 MIL/uL (ref 3.87–5.11)
RDW: 15.3 % (ref 11.5–15.5)
WBC: 9.2 10*3/uL (ref 4.0–10.5)

## 2015-09-20 LAB — POCT URINALYSIS DIPSTICK
BILIRUBIN UA: NEGATIVE
Blood, UA: NEGATIVE
Glucose, UA: NEGATIVE
KETONES UA: NEGATIVE
LEUKOCYTES UA: NEGATIVE
Nitrite, UA: NEGATIVE
PH UA: 6
Protein, UA: NEGATIVE
Spec Grav, UA: >=1.03
Urobilinogen, UA: NEGATIVE

## 2015-09-20 NOTE — Progress Notes (Signed)
Subjective:    Patient ID: Bonnie Larsen, female    DOB: [birth year], 35 y.o.   MRN: [id number]  HPI Chief Complaint  Patient presents with  . pelvic pain    pelvic pain- has uterine fibroids- not sure if getting big. went to obgyn . having pain during sex cause of the pressure and then will have pain until after it-    She is new to the practice and here for an acute problem.  She complains of left lower quadrant pain that is worse with sexual intercourse. She also thinks pain is associated with having a lot of caffeine. States pain has been ongoing, on and off for about 2 years, and has been getting worse in past 2 months. Pain is occuring more often and also lasts after sexual intercourse for several days. She has a gynecologist, saw him in February 2016 for same complaints. States she has history of fibroids that are "small" and was told that she could have a hysterectomy but she was not happy about this. Gynecologist is at [facility].  Denies fever, chills, back pain, unexplained weight change, night sweats, nausea, vomiting, constipation or diarrhea. Also denies vaginal discharge or itching. Denies STI history and declines need for STI testing today.   Sates her periods are very heavy and she has to wear tampon and pad and change fairly often. She reports decreased energy and states she history of anemia. She was taking daily iron but stopped because she never follow up to see if she still needed it.  States her periods are irregular and she bleeds 7-8 days with heavy bleeding and clotting the "entire time".    Has a daughter age 52 and son age 25.   Had Nexplanon in past but states she had bad side effects from this. She was told to avoid birth control and hormones.   She had her tubes tied about 9 years ago.   She would like to be referred to a different gynecologist. She thinks she might have endometriosis because she has been reading about her symptoms.    Other medical  history: Mammogram and biopsy of breasts showed fibrous tissue, benign.   Mother with RA and fibromyalgia, finished treatment for breast cancer last year.  Father- cirrhosis- deceased. He was a heavy drinker per patient.  MGM- DM, CHF- deceased PGF- deceased from leukemia.   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  Works from home.  Lives with daughter, son and husband.   Denies smoking, rare alcohol use, denies drug use.   Review of Systems Pertinent positives and negatives in the history of present illness.     Objective:   Physical Exam  Constitutional: She appears well-developed and well-nourished. No distress.  Cardiovascular: Normal rate, regular rhythm and normal heart sounds.   Pulmonary/Chest: Effort normal and breath sounds normal.  Abdominal: Soft. Normal appearance and bowel sounds are normal. There is no hepatosplenomegaly. There is tenderness. There is no rigidity, no rebound, no guarding, no CVA tenderness, no tenderness at McBurney's point and negative Murphy's sign.    Genitourinary: Vagina normal. There is no rash on the right labia. There is no rash on the left labia. Uterus is enlarged and tender. Right adnexum displays no mass and no tenderness. Left adnexum displays no mass and no tenderness. No tenderness in the vagina.   BP 120/80 mmHg  Pulse 80  Wt 239 lb 9.6 oz (108.682 kg)  LMP 09/05/2015  Urinalysis dipstick:  Negative Urine pregnancy: negative    Assessment & Plan:  Pelvic pain in female - Plan: POCT urinalysis dipstick, POCT urine pregnancy, CBC with Differential/Platelet, Comprehensive metabolic panel, Ambulatory referral to Gynecology  History of anemia - Plan: CBC with Differential/Platelet  History of uterine fibroid - Plan: Ambulatory referral to Gynecology  Dyspareunia in female - Plan: Ambulatory referral to Gynecology  Menorrhagia with irregular cycle - Plan: Ambulatory referral to Gynecology  Other fatigue - Plan:  CBC with Differential/Platelet, Comprehensive metabolic panel, TSH  Ruled out UTI and pregnancy. Will order labs to look for underlying etiologies for fatigue such as anemia or thyroid dysfunction. She will need to restart iron if anemic.  Will refer to gynecologist for further evaluation and recommendations for heavy and irregular cycles, dyspareunia and fibroids. She has somewhat complicated gynecological history and history of intolerance to hormone therapy.  Recommend that she return for CPE and fasting labs.

## 2015-09-20 NOTE — Telephone Encounter (Signed)
Called and left a message for patient to call back to schedule a new patient doctor referral. °

## 2015-09-20 NOTE — Patient Instructions (Signed)
We are referring you to [facility] and they will call you to schedule an appointment. We wiill call you with the lab results in the next 48 hours.

## 2015-09-21 ENCOUNTER — Ambulatory Visit (INDEPENDENT_AMBULATORY_CARE_PROVIDER_SITE_OTHER): Payer: 59 | Admitting: Obstetrics and Gynecology

## 2015-09-21 ENCOUNTER — Encounter: Payer: Self-pay | Admitting: Obstetrics and Gynecology

## 2015-09-21 VITALS — BP 112/60 | HR 80 | Resp 14 | Ht 63.5 in | Wt 237.0 lb

## 2015-09-21 DIAGNOSIS — D259 Leiomyoma of uterus, unspecified: Secondary | ICD-10-CM

## 2015-09-21 DIAGNOSIS — N8184 Pelvic muscle wasting: Secondary | ICD-10-CM

## 2015-09-21 DIAGNOSIS — N941 Unspecified dyspareunia: Secondary | ICD-10-CM

## 2015-09-21 DIAGNOSIS — N946 Dysmenorrhea, unspecified: Secondary | ICD-10-CM | POA: Diagnosis not present

## 2015-09-21 DIAGNOSIS — N921 Excessive and frequent menstruation with irregular cycle: Secondary | ICD-10-CM

## 2015-09-21 DIAGNOSIS — M6289 Other specified disorders of muscle: Secondary | ICD-10-CM

## 2015-09-21 LAB — COMPREHENSIVE METABOLIC PANEL
ALBUMIN: 4 g/dL (ref 3.6–5.1)
ALT: 6 U/L (ref 6–29)
AST: 15 U/L (ref 10–30)
Alkaline Phosphatase: 37 U/L (ref 33–115)
BUN: 8 mg/dL (ref 7–25)
CALCIUM: 8.7 mg/dL (ref 8.6–10.2)
CHLORIDE: 104 mmol/L (ref 98–110)
CO2: 26 mmol/L (ref 20–31)
Creat: 1.01 mg/dL (ref 0.50–1.10)
Glucose, Bld: 90 mg/dL (ref 65–99)
POTASSIUM: 4.2 mmol/L (ref 3.5–5.3)
Sodium: 139 mmol/L (ref 135–146)
TOTAL PROTEIN: 6.9 g/dL (ref 6.1–8.1)
Total Bilirubin: 0.3 mg/dL (ref 0.2–1.2)

## 2015-09-21 LAB — TSH: TSH: 1.41 m[IU]/L

## 2015-09-21 MED ORDER — DOXYCYCLINE HYCLATE 100 MG PO CAPS: 100.0000 mg | ORAL_CAPSULE | Freq: Two times a day (BID) | ORAL | Status: DC

## 2015-09-21 MED ORDER — NAPROXEN SODIUM 550 MG PO TABS: ORAL_TABLET | ORAL | Status: DC

## 2015-09-21 NOTE — Progress Notes (Signed)
Patient ID: Bonnie Larsen, female   DOB: Nov 08, 1980, 35 y.o.   MRN: [id number] 35 y.o. [id number] MarriedAfrican AmericanF here c/o heavy irregular menstrual cycles. She is sent for a consultation by Belva Crome, NP for menorrhagia, dyspareunia, dysmenorrhea and a fibroid uterus.  Menses q 3-5 weeks. She saturates a super + tampon in up to 1 hour. Cramps are severe for 5 days, takes Alieve, can take the edge off. Misses work.  Sexually active, c/o dyspareunia deep inside, every time. It used to be positional, now it's not. The pain lasts for up to 2-3 days after intercourse. No fevers, no bowel or bladder c/o. She has tubal ligation.  She just had blood work, normal TSH, normal WBC and Hgb. She was on the implanon, had leg swelling. Had erythema nodosum, symptoms resolved when the implanon was removed. She was on OCP's in the past, did okay on the seasonale, except would forget to take it. Got pregnant on the depo-provera, didn't like the nuvaring.  Recent blood work, normal TSH, normal Hgb She had an ultrasound last year. Period Duration (Days): 7-8 days  Period Pattern: (!) Irregular Menstrual Flow: Heavy Menstrual Control: Maxi pad, Tampon Menstrual Control Change Freq (Hours): changes pads and tampon every hour  Dysmenorrhea: (!) Severe Dysmenorrhea Symptoms: Cramping  Patient's last menstrual period was 09/05/2015.          Sexually active: Yes.    The current method of family planning is tubal ligation.    Exercising: No.  The patient does not participate in regular exercise at present. Smoker:  no  Health Maintenance: Pap:  07/2014 WNL per patient  History of abnormal Pap:  no MMG:  08-03-14 need additional imaging - Breast BX 08-14-15 Benign  Colonoscopy:  Never BMD:   Never TDaP:  4/ 2016 Gardasil: N/A   reports that she has never smoked. She has never used smokeless tobacco. She reports that she does not drink alcohol or use illicit drugs. 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 She has a  85 year old son and 52 year old daughter.   Past Medical History  Diagnosis Date  . Anxiety   . Fibroid     Past Surgical History  Procedure Laterality Date  . Tubal ligation      Current Outpatient Prescriptions  Medication Sig Dispense Refill  . Multiple Vitamins-Minerals (MULTIVITAMIN WITH MINERALS) tablet Take 1 tablet by mouth daily.     No current facility-administered medications for this visit.    Family History  Problem Relation Age of Onset  . Breast cancer Mother   . Fibromyalgia Mother   . Rheum arthritis Mother   . Liver disease Father   . Heart disease Maternal Grandmother   . Diabetes Maternal Grandmother     Review of Systems  Constitutional: Negative.   HENT: Negative.   Eyes: Negative.   Respiratory: Negative.   Cardiovascular: Negative.   Gastrointestinal: Negative.   Endocrine: Negative.   Genitourinary: Positive for menstrual problem.       Heavy irregular menstrual cycles   Musculoskeletal: Negative.   Skin: Negative.   Allergic/Immunologic: Negative.   Neurological: Negative.   Psychiatric/Behavioral: Negative.   Some joint pain, varies.   Exam:   BP 112/60 mmHg  Pulse 80  Resp 14  Ht 5' 3.5" (1.613 m)  Wt 237 lb (107.502 kg)  BMI 41.32 kg/m2  LMP 09/05/2015  Weight change: @WEIGHTCHANGE @ Height:   Height: 5' 3.5" (161.3 cm)  Ht Readings from Last 3  Encounters:  09/21/15 5' 3.5" (1.613 m)  03/13/14 5\' 3"  (1.6 m)  05/29/12 5\' 3"  (1.6 m)    General appearance: alert, cooperative and appears stated age Head: Normocephalic, without obvious abnormality, atraumatic Neck: no adenopathy, supple, symmetrical, trachea midline and thyroid normal to inspection and palpation Lungs: clear to auscultation bilaterally Heart: regular rate and rhythm Abdomen: soft, non-tender; bowel sounds normal; no masses,  no organomegaly Extremities: extremities normal, atraumatic, no cyanosis or edema Skin: Skin color, texture, turgor normal. No rashes or  lesions Lymph nodes: Cervical, supraclavicular, and axillary nodes normal. No abnormal inguinal nodes palpated Neurologic: Grossly normal   Pelvic: External genitalia:  no lesions              Urethra:  normal appearing urethra with no masses, tenderness or lesions              Bartholins and Skenes: normal                 Vagina: normal appearing vagina with normal color and discharge, no lesions              Cervix: no lesions, no CMT Pelvic floor: tender L>R               Bimanual Exam:  Uterus:  uterus anteverted, mobile, tender, not appreciably enlarged.              Adnexa: no masses, tender in the left adnexa               Rectovaginal: Confirms               Anus:  normal sphincter tone, no lesions  Chaperone was present for exam.  A:  Menorrhagia, not anemic, normal TSH  Severe Dysmenorrhea  Worsening severe dyspareunia  Uterus is tender to palpation, no CMT  Pelvic floor tenderness    P:   Get a copy of GYN notes and ultrasound for the last 2 years  Consider a sonohysterogram (possible submucosal myoma)  Genprobe  Treat for presumed endometritis  Anaprox DS for cramps  F/U exam in 2 weeks, if pelvic floor is still tender will send to PT  We discussed the option of a mirena IUD to control bleeding and dysmenorrhea. She hasn't remembered or tolerated other forms of contraception  Discussed the option of laparoscopy   CC: Belva Crome, NP Letter sent

## 2015-09-22 LAB — GC/CHLAMYDIA PROBE AMP
CT Probe RNA: NOT DETECTED
GC PROBE AMP APTIMA: NOT DETECTED

## 2015-10-05 ENCOUNTER — Ambulatory Visit (INDEPENDENT_AMBULATORY_CARE_PROVIDER_SITE_OTHER): Payer: 59 | Admitting: Obstetrics and Gynecology

## 2015-10-05 ENCOUNTER — Telehealth: Payer: Self-pay | Admitting: Obstetrics and Gynecology

## 2015-10-05 ENCOUNTER — Ambulatory Visit: Payer: 59 | Admitting: Obstetrics and Gynecology

## 2015-10-05 ENCOUNTER — Encounter: Payer: Self-pay | Admitting: Obstetrics and Gynecology

## 2015-10-05 VITALS — BP 118/62 | HR 86 | Resp 14 | Wt 236.0 lb

## 2015-10-05 DIAGNOSIS — N946 Dysmenorrhea, unspecified: Secondary | ICD-10-CM | POA: Diagnosis not present

## 2015-10-05 DIAGNOSIS — N921 Excessive and frequent menstruation with irregular cycle: Secondary | ICD-10-CM | POA: Diagnosis not present

## 2015-10-05 DIAGNOSIS — N941 Unspecified dyspareunia: Secondary | ICD-10-CM | POA: Diagnosis not present

## 2015-10-05 NOTE — Progress Notes (Signed)
GYNECOLOGY  VISIT   HPI: 35 y.o.   Married  [place] American  female   [phone] with Patient's last menstrual period was 09/28/2015 (exact date).   here for   Follow up, the patient was treated recently for presumed endometritis. She c/o heavy slightly irregular menses with severe dysmenorrhea and deep dyspareunia. She has had a normal hgb (on iron), normal TSH, a negative UPT and negative genprobe. She hasn't been sexually active since being treated with antibiotics. Her cramps weren't quite as severe this last cycle. The anaprox ds helped take the edge off. Cycles still as heavy, saturating 1 super tampon an hour.  She is not interested in OCP's. GYNECOLOGIC HISTORY: Patient's last menstrual period was 09/28/2015 (exact date). Contraception: None Menopausal hormone therapy: None        OB History    Gravida Para Term Preterm AB TAB SAB Ectopic Multiple Living   4 2 2  2     2          There are no active problems to display for this patient.   Past Medical History  Diagnosis Date  . Anxiety   . Fibroid     Past Surgical History  Procedure Laterality Date  . Tubal ligation      Current Outpatient Prescriptions  Medication Sig Dispense Refill  . ferrous sulfate 325 (65 FE) MG tablet Take 325 mg by mouth daily with breakfast.    . Multiple Vitamins-Minerals (MULTIVITAMIN WITH MINERALS) tablet Take 1 tablet by mouth daily.    . naproxen sodium (ANAPROX DS) 550 MG tablet 1 tab po q 12 hours prn 30 tablet 2   No current facility-administered medications for this visit.     ALLERGIES: Clindamycin/lincomycin  Family History  Problem Relation Age of Onset  . Breast cancer Mother   . Fibromyalgia Mother   . Rheum arthritis Mother   . Liver disease Father   . Heart disease Maternal Grandmother   . Diabetes Maternal Grandmother     Social History   Social History  . Marital Status: Married    Spouse Name: N/A  . Number of Children: N/A  . Years of Education: N/A    Occupational History  . Not on file.   Social History Main Topics  . Smoking status: Never Smoker   . Smokeless tobacco: Never Used  . Alcohol Use: No  . Drug Use: No  . Sexual Activity:    Partners: Male   Other Topics Concern  . Not on file   Social History Narrative    Review of Systems  Constitutional: Negative.   HENT: Negative.   Eyes: Negative.   Respiratory: Negative.        Cough/Seasonal Allergies   Cardiovascular: Negative.   Gastrointestinal: Negative.   Genitourinary:       Pelvic tenderness   Musculoskeletal: Negative.   Skin: Negative.   Neurological: Negative.   Endo/Heme/Allergies: Negative.   Psychiatric/Behavioral: Negative.     PHYSICAL EXAMINATION:    BP 118/62 mmHg  Pulse 86  Resp 14  Wt 236 lb (107.049 kg)  LMP 09/28/2015 (Exact Date)    General appearance: alert, cooperative and appears stated age Abdomen: soft, non-tender; bowel sounds normal; no masses,  no organomegaly  Pelvic: External genitalia:  no lesions              Cervix: no cervical motion tenderness              Bimanual Exam:  Uterus:  not  appreciably enlarged, mobile, still tender to palpation and movement              Adnexa: no mass, fullness, tenderness             Pelvic floor: not tender  Chaperone was present for exam.  ASSESSMENT Menorrhagia, not anemic, but on iron Severe dysmenorrhea, slightly improved with Anaprox DS Dyspareunia The patient was treated for a presumed endometritis, bleeding unchanged, still with uterine tenderness    PLAN Awaiting old records, including ultrasound Return for a sonohysterogram Depending on the results of the sonohysterogram will make further plans We discussed possible hysteroscopic myomectomy (depending on fibroid location), Mirena IUD, laparoscopy or hysterectomy. She declines OCP's   An After Visit Summary was printed and given to the patient.  15 minutes face to face time of which over 50% was spent in  counseling.

## 2015-10-05 NOTE — Telephone Encounter (Signed)
Spoke with patient regarding benefits for recommended procedure. Patient has declined scheduling at this time. Routing to clinical triage for review

## 2015-10-10 NOTE — Telephone Encounter (Signed)
Routing to Dr. Talbert Nan to review and advise.  Patient has declined to schedule Sonohysterogram.

## 2015-10-12 NOTE — Telephone Encounter (Signed)
She is not anemic (on iron). She does have significant pain and very heavy bleeding. Ultimately her decision if she wants further evaluation and treatment.

## 2015-10-18 NOTE — Telephone Encounter (Signed)
Order completed since patient declines. Encounter closed.

## 2015-10-24 ENCOUNTER — Telehealth: Payer: Self-pay | Admitting: Obstetrics and Gynecology

## 2015-10-24 ENCOUNTER — Encounter: Payer: Self-pay | Admitting: Obstetrics and Gynecology

## 2015-10-24 ENCOUNTER — Ambulatory Visit (INDEPENDENT_AMBULATORY_CARE_PROVIDER_SITE_OTHER): Payer: 59 | Admitting: Obstetrics and Gynecology

## 2015-10-24 VITALS — BP 102/60 | HR 88 | Resp 16 | Wt 236.0 lb

## 2015-10-24 DIAGNOSIS — N921 Excessive and frequent menstruation with irregular cycle: Secondary | ICD-10-CM

## 2015-10-24 DIAGNOSIS — Z01812 Encounter for preprocedural laboratory examination: Secondary | ICD-10-CM | POA: Diagnosis not present

## 2015-10-24 DIAGNOSIS — N946 Dysmenorrhea, unspecified: Secondary | ICD-10-CM

## 2015-10-24 NOTE — Progress Notes (Addendum)
Patient ID: Bonnie Larsen, female   DOB: Nov 15, 1980, 35 y.o.   MRN: [id number] GYNECOLOGY  VISIT   HPI: 35 y.o.   Married  [place] American  female   [phone] with Patient's last menstrual period was 10/22/2015.   here for Mirena IUD insertion. The patient is here for mirena IUD insertion for control of heavy, crampy cycles. She had a normal TSH, Hgb, negative genprobe.  She declines ultrasound/sonohysterogram. U/S records pending. Normal exam. She has also had a recent negative genprobe. No new partner  GYNECOLOGIC HISTORY: Patient's last menstrual period was 10/22/2015. Contraception:None Menopausal hormone therapy: None         OB History    Gravida Para Term Preterm AB TAB SAB Ectopic Multiple Living   4 2 2  2     2          There are no active problems to display for this patient.   Past Medical History  Diagnosis Date  . Anxiety   . Fibroid     Past Surgical History  Procedure Laterality Date  . Tubal ligation      Current Outpatient Prescriptions  Medication Sig Dispense Refill  . ferrous sulfate 325 (65 FE) MG tablet Take 325 mg by mouth daily with breakfast.    . Multiple Vitamins-Minerals (MULTIVITAMIN WITH MINERALS) tablet Take 1 tablet by mouth daily.    . naproxen sodium (ANAPROX DS) 550 MG tablet 1 tab po q 12 hours prn 30 tablet 2   No current facility-administered medications for this visit.     ALLERGIES: Clindamycin/lincomycin  Family History  Problem Relation Age of Onset  . Breast cancer Mother   . Fibromyalgia Mother   . Rheum arthritis Mother   . Liver disease Father   . Heart disease Maternal Grandmother   . Diabetes Maternal Grandmother     Social History   Social History  . Marital Status: Married    Spouse Name: N/A  . Number of Children: N/A  . Years of Education: N/A   Occupational History  . Not on file.   Social History Main Topics  . Smoking status: Never Smoker   . Smokeless tobacco: Never Used  . Alcohol Use: No  .  Drug Use: No  . Sexual Activity:    Partners: Male   Other Topics Concern  . Not on file   Social History Narrative    Review of Systems  Constitutional: Negative.   HENT: Negative.   Eyes: Negative.   Respiratory: Negative.   Cardiovascular: Negative.   Gastrointestinal: Negative.   Genitourinary: Negative.   Musculoskeletal: Negative.   Skin: Negative.   Neurological: Negative.   Endo/Heme/Allergies: Negative.   Psychiatric/Behavioral: Negative.     PHYSICAL EXAMINATION:    BP 102/60 mmHg  Pulse 88  Resp 16  Wt 236 lb (107.049 kg)  LMP 10/22/2015    General appearance: alert, cooperative and appears stated age  Pelvic: External genitalia:  no lesions              Urethra:  normal appearing urethra with no masses, tenderness or lesions              Bartholins and Skenes: normal                 Vagina: normal appearing vagina with normal color and discharge, no lesions              Cervix: no lesions  The risks of the mirena IUD were  reviewed with the patient, including infection, abnormal bleeding and uterine perfortion. Consent was signed.  A speculum was placed in the vagina, the cervix was cleansed with betadine. A tenaculum was placed on the cervix, the uterus sounded to 7 cm.  The mirena IUD was inserted without difficulty. The string were cut to 3-4 cm. The tenaculum was removed. Slight oozing from the tenaculum site was stopped with pressure.   The patient tolerated the procedure well.   Chaperone was present for exam.  ASSESSMENT Menorrhagia, dysmenorrhea, normal HGB, TSH, negative genprobe. Declines ultrasound    PLAN Mirena IUD inserted F/U in 1 month Call with any concerns   An After Visit Summary was printed and given to the patient.   Addendum: Old records received and reviewed In 2/16 she had an ultrasound. The uterus measured 8.5 x 5.7 x 4.8 cm. 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 Stripe was 13 mm. Sonohysterogram was negative.

## 2015-10-24 NOTE — Telephone Encounter (Signed)
Spoke with patient. Patient states that she started her cycle on 10/22/2015 and would like to schedule her IUD insertion. Appointment scheduled for today 10/24/2015 at 9:30 am with Dr.Jertson. She is agreeable to date and time. Pre procedure instructions given.  Motrin instructions given. Motrin=Advil=Ibuprofen, 800 mg one hour before appointment. Eat a meal and hydrate well before appointment. Order placed for precert.  Cc: Ozora to provider for final review. Patient agreeable to disposition. Will close encounter.

## 2015-10-24 NOTE — Telephone Encounter (Signed)
Patient calling to schedule an iud insertion.

## 2015-10-26 ENCOUNTER — Telehealth: Payer: Self-pay | Admitting: Obstetrics and Gynecology

## 2015-10-26 NOTE — Telephone Encounter (Signed)
Please inform the patient that her old records were received and reviewed. She had one small fibroid in the wall of the uterus and her cavity was normal on sonohysterogram. I anticipate the mirena IUD is going to work very well for her. Please see how she is feeling, IUD placed earlier this week.

## 2015-10-26 NOTE — Telephone Encounter (Signed)
10-26-15 Davidson -eh

## 2015-11-01 NOTE — Telephone Encounter (Signed)
I spoke with patient and she is doing very well with the IUD. She said that her period was much lighter and only lasted a short time.

## 2015-11-22 ENCOUNTER — Telehealth: Payer: Self-pay | Admitting: Obstetrics and Gynecology

## 2015-11-22 ENCOUNTER — Ambulatory Visit: Payer: 59 | Admitting: Obstetrics and Gynecology

## 2015-11-22 ENCOUNTER — Encounter: Payer: Self-pay | Admitting: Obstetrics and Gynecology

## 2015-11-22 NOTE — Telephone Encounter (Signed)
Patient dnka her 4 week recheck appointment today. I left a message for patient to call and reschedule. [facility] policy followed.

## 2016-01-25 NOTE — Telephone Encounter (Signed)
Will close encounter

## 2016-01-25 NOTE — Telephone Encounter (Signed)
Okay to close encounter?  °

## 2016-03-29 ENCOUNTER — Encounter: Payer: Self-pay | Admitting: Obstetrics and Gynecology

## 2016-05-21 ENCOUNTER — Encounter: Payer: Self-pay | Admitting: Obstetrics and Gynecology

## 2016-07-30 ENCOUNTER — Encounter: Payer: Self-pay | Admitting: Family

## 2016-07-30 ENCOUNTER — Ambulatory Visit (INDEPENDENT_AMBULATORY_CARE_PROVIDER_SITE_OTHER): Payer: Self-pay | Admitting: Family

## 2016-07-30 VITALS — BP 100/62 | HR 120 | Temp 101.1°F | Wt 232.6 lb

## 2016-07-30 DIAGNOSIS — B9689 Other specified bacterial agents as the cause of diseases classified elsewhere: Secondary | ICD-10-CM

## 2016-07-30 DIAGNOSIS — J028 Acute pharyngitis due to other specified organisms: Secondary | ICD-10-CM

## 2016-07-30 HISTORY — DX: Other specified bacterial agents as the cause of diseases classified elsewhere: B96.89

## 2016-07-30 LAB — POCT INFLUENZA A/B
INFLUENZA A, POC: NEGATIVE
Influenza B, POC: NEGATIVE

## 2016-07-30 MED ORDER — AZITHROMYCIN 250 MG PO TABS: ORAL_TABLET | ORAL | 0 refills | Status: DC

## 2016-07-30 MED ORDER — ALBUTEROL SULFATE HFA 108 (90 BASE) MCG/ACT IN AERS: 1.0000 | INHALATION_SPRAY | RESPIRATORY_TRACT | 0 refills | Status: DC | PRN

## 2016-07-30 NOTE — Progress Notes (Signed)
Bonnie Larsen  No chief complaint on file.     ICD-9-CM ICD-10-CM   1. Bacterial pharyngitis 462 J02.8 POCT Influenza A/B    B96.89     Patient Active Problem List   Diagnosis Date Noted  . Bacterial pharyngitis 07/30/2016    Past Medical History:  Diagnosis Date  . Anxiety   . Fibroid     Past Surgical History:  Procedure Laterality Date  . TUBAL LIGATION      Allergies  Allergen Reactions  . Clindamycin/Lincomycin Swelling    Pt experienced facial swelling to eyes, lips, cheeks, and started to panic.    BP 100/62   Pulse (!) 120   Temp (!) 101.1 F (38.4 C)   Wt 232 lb 9.6 oz (105.5 kg)   SpO2 98%   BMI 40.56 kg/m   Review of Systems  HENT: Positive for congestion, ear pain, sinus pain and sore throat.   Respiratory: Positive for cough, sputum production, shortness of breath and wheezing.   All other systems reviewed and are negative.  Physical Exam  Constitutional: She is oriented to person, place, and time. She appears well-developed and well-nourished.  HENT:  Head: Normocephalic and atraumatic.  Right Ear: External ear normal.  Left Ear: External ear normal.  Nose: Nose normal.  Mouth/Throat: Oropharynx is clear and moist.  Eyes: Conjunctivae and EOM are normal. Pupils are equal, round, and reactive to light.  Neck: Normal range of motion.  Cardiovascular: Normal rate, regular rhythm and normal heart sounds.   Pulmonary/Chest: Effort normal and breath sounds normal.  Abdominal: Soft. Bowel sounds are normal.  Musculoskeletal: Normal range of motion.  Neurological: She is alert and oriented to person, place, and time.  Skin: Skin is warm and dry. Capillary refill takes less than 2 seconds.  Vitals reviewed.   Results for orders placed or performed in visit on 07/30/16 (from the past 72 hour(s))  POCT Influenza A/B     Status: None   Collection Time: 07/30/16 11:10 AM  Result Value Ref Range   Influenza A, POC Negative Negative   Influenza B,  POC Negative Negative     Current Outpatient Prescriptions:  .  ferrous sulfate 325 (65 FE) MG tablet, Take 325 mg by mouth daily with breakfast., Disp: , Rfl:  .  Multiple Vitamins-Minerals (MULTIVITAMIN WITH MINERALS) tablet, Take 1 tablet by mouth daily., Disp: , Rfl:  .  naproxen sodium (ANAPROX DS) 550 MG tablet, 1 tab po q 12 hours prn, Disp: 30 tablet, Rfl: 2  Subjective: "I started with a fever several days ago, felt better, and now the fever/chills keep coming back. I need to cough some phlegm out but I can't get it out of my lungs."  Objective: Pt seen and chart reviewed. Pt is alert, oriented x3, calm, cooperative, and appropriate. Pt reports fever, chills, cough, sinus congestion, pressure x 4-6 days improving then worsening.   Assessment: Bacterial pharyngitis  Plan:  -Albuterol inhaler HFA 108, 1-2 puffs q4h pr shob -Z-pack -OTC mucinex  Benjamine Mola, FNP 07/30/2016 11:23 AM

## 2017-01-05 ENCOUNTER — Encounter (HOSPITAL_COMMUNITY): Payer: Self-pay | Admitting: Emergency Medicine

## 2017-01-05 ENCOUNTER — Emergency Department (HOSPITAL_COMMUNITY)
Admission: EM | Admit: 2017-01-05 | Discharge: 2017-01-06 | Disposition: A | Discharge status: Home / Self Care | Payer: 59 | Attending: Emergency Medicine | Admitting: Emergency Medicine

## 2017-01-05 DIAGNOSIS — Z79899 Other long term (current) drug therapy: Secondary | ICD-10-CM | POA: Diagnosis not present

## 2017-01-05 DIAGNOSIS — R079 Chest pain, unspecified: Secondary | ICD-10-CM

## 2017-01-05 DIAGNOSIS — R222 Localized swelling, mass and lump, trunk: Secondary | ICD-10-CM | POA: Diagnosis not present

## 2017-01-05 MED ORDER — KETOROLAC TROMETHAMINE 30 MG/ML IJ SOLN
30.0000 mg | Freq: Once | INTRAMUSCULAR | Status: AC
  Administered 2017-01-06: 30 mg via INTRAVENOUS
  Filled 2017-01-05: qty 1

## 2017-01-05 MED ORDER — NITROGLYCERIN 0.4 MG SL SUBL: 0.4000 mg | SUBLINGUAL_TABLET | SUBLINGUAL | Status: DC | PRN

## 2017-01-05 MED ORDER — ASPIRIN 81 MG PO CHEW: 324.0000 mg | CHEWABLE_TABLET | Freq: Once | ORAL | Status: DC

## 2017-01-05 NOTE — ED Triage Notes (Signed)
Per EMS, pt had sudden onset chest pain/pressure that radiated to the back beginning 2 hours PTA. Denies shortness of breath, n/v, diaphoresis. Given 324 aspirin, SpO2-98% room air, RR-18. EMS vitals: BP-108/78

## 2017-01-05 NOTE — ED Provider Notes (Signed)
[facility] DEPT Provider Note   CSN: [id number] Arrival date & time: 01/05/17  2326   By signing my name below, I, Bonnie Larsen, attest that this documentation has been prepared under the direction and in the presence of Delora Fuel, MD. Electronically signed, Bonnie Larsen, ED Scribe. 01/05/17. 12:04 AM.    History   Chief Complaint Chief Complaint  Patient presents with  . Chest Pain   The history is provided by the patient and medical records. No language interpreter was used.    Bonnie Larsen is a 36 y.o. female BIB EMS to the Emergency Department concerning central chest pain onset ~8:15 PM this evening. Symptoms improved on evaluation. She states SOB came on around the same time as her chest pain; associated chest tightness, posterior neck pain en route with EMS that has subsided and bilateral jaw pains this morning. Pt also notes "scratchy throat" sensation that she believes to be related to historic sinus issues. Pt states her pain was first at a 9/10 severity, and it is now a 5/10 heaviness. She adds the pain has radiated to her back between shoulder blades and describes this pain as sharp. 4 ASA given by EMS at pt's home, 2 doses of nitroglycerine given en route with EMS. Pt allegedly used her prescription PRN albuterol inhaler at home without relief.  H/o similar symptoms "earlier this week" that subsided without intervention after 1-1.5 hours. H/o anemia noted. Pt states her grandmother and grandfather each had CHF, her grandmother died in her early 34's from this and her grandfather died in his 82's of liver cirrhosis. No DM, blood clots or heart disorder hx. No tobacco use. No N/V, sweating, wheezing, coughing, recent surgeries or long distance travel. No other complaints at this time.   Past Medical History:  Diagnosis Date  . Anxiety   . Fibroid     Patient Active Problem List   Diagnosis Date Noted  . Bacterial pharyngitis 07/30/2016    Past Surgical History:    Procedure Laterality Date  . TUBAL LIGATION      OB History    Gravida Para Term Preterm AB Living   4 2 2   2 2    SAB TAB Ectopic Multiple Live Births           2       Home Medications    Prior to Admission medications   Medication Sig Start Date End Date Taking? Authorizing Provider  albuterol (PROVENTIL HFA;VENTOLIN HFA) 108 (90 Base) MCG/ACT inhaler Inhale 1-2 puffs into the lungs every 4 (four) hours as needed for wheezing or shortness of breath. 07/30/16   Withrow, Elyse Jarvis, FNP  azithromycin (ZITHROMAX) 250 MG tablet Take 2 tabs now then 1 daily times 4 days 07/30/16   Withrow, Elyse Jarvis, FNP  ferrous sulfate 325 (65 FE) MG tablet Take 325 mg by mouth daily with breakfast.    [provider]  Multiple Vitamins-Minerals (MULTIVITAMIN WITH MINERALS) tablet Take 1 tablet by mouth daily.    [provider]  naproxen sodium (ANAPROX DS) 550 MG tablet 1 tab po q 12 hours prn 09/21/15   Salvadore Dom, MD    Family History Family History  Problem Relation Age of Onset  . Breast cancer Mother   . Fibromyalgia Mother   . Rheum arthritis Mother   . Liver disease Father   . Heart disease Maternal Grandmother   . Diabetes Maternal Grandmother     Social History Social History  Substance  Use Topics  . Smoking status: Never Smoker  . Smokeless tobacco: Never Used  . Alcohol use No     Allergies   Clindamycin/lincomycin   Review of Systems Review of Systems  Constitutional: Negative for diaphoresis.  Respiratory: Positive for chest tightness and shortness of breath. Negative for cough and wheezing.   Cardiovascular: Positive for chest pain.  Gastrointestinal: Negative for nausea and vomiting.  Musculoskeletal: Positive for neck pain.  All other systems reviewed and are negative.    Physical Exam Updated Vital Signs BP 128/74 (BP Location: Right Arm)   Pulse 99   Temp 98.6 F (37 C) (Oral)   Resp 16   SpO2 100%   Physical Exam   Constitutional: She is oriented to person, place, and time. She appears well-developed and well-nourished.  HENT:  Head: Normocephalic and atraumatic.  Eyes: Pupils are equal, round, and reactive to light. EOM are normal.  Neck: Normal range of motion. Neck supple. No JVD present.  Cardiovascular: Normal rate, regular rhythm and normal heart sounds.   No murmur heard. Pulmonary/Chest: Effort normal and breath sounds normal. She has no wheezes. She has no rales. She exhibits tenderness.  Mild to moderate anterior chest wall tenderness.  Abdominal: Soft. Bowel sounds are normal. She exhibits no distension and no mass. There is no tenderness.  Musculoskeletal: Normal range of motion. She exhibits no edema.  Lymphadenopathy:    She has no cervical adenopathy.  Neurological: She is alert and oriented to person, place, and time. No cranial nerve deficit. She exhibits normal muscle tone. Coordination normal.  Skin: Skin is warm and dry. No rash noted.  Psychiatric: She has a normal mood and affect. Her behavior is normal. Judgment and thought content normal.  Nursing note and vitals reviewed.    ED Treatments / Results  DIAGNOSTIC STUDIES: Oxygen Saturation is 100% on RA, NL by my interpretation.    COORDINATION OF CARE: 11:49 PM-Discussed next steps with pt. Pt verbalized understanding and is agreeable with the plan. She will get ketorolac for pain, chest x-ray obtained, screening labs including troponin.   Labs (all labs ordered are listed, but only abnormal results are displayed) Labs Reviewed  BASIC METABOLIC PANEL - Abnormal; Notable for the following:       Result Value   Glucose, Bld 106 (*)    Creatinine, Ser 1.13 (*)    All other components within normal limits  CBC WITH DIFFERENTIAL/PLATELET - Abnormal; Notable for the following:    MCH 25.3 (*)    All other components within normal limits  D-DIMER, QUANTITATIVE (NOT AT [facility]) - Abnormal; Notable for the following:     D-Dimer, Quant 1.80 (*)    All other components within normal limits  HEPATIC FUNCTION PANEL - Abnormal; Notable for the following:    ALT 13 (*)    Alkaline Phosphatase 34 (*)    All other components within normal limits  I-STAT TROPOININ, ED  I-STAT TROPOININ, ED    EKG  EKG Interpretation  Date/Time:  Saturday January 05 2017 23:34:06 EDT Ventricular Rate:  97 PR Interval:    QRS Duration: 74 QT Interval:  358 QTC Calculation: 455 R Axis:   64 Text Interpretation:  Sinus rhythm Low voltage QRS When compared with ECG of 11/30/2004, No significant change was found Confirmed by Delora Fuel ([id number]) on 01/05/2017 11:41:51 PM       Radiology Dg Chest 2 View  Result Date: 01/06/2017 CLINICAL DATA:  Sudden onset of chest pain and  pressure EXAM: CHEST  2 VIEW COMPARISON:  None. FINDINGS: The heart size and mediastinal contours are within normal limits. Both lungs are clear. The visualized skeletal structures are unremarkable. IMPRESSION: No active cardiopulmonary disease. Electronically Signed   By: Donavan Foil M.D.   On: 01/06/2017 01:28   Ct Angio Chest Pe W And/or Wo Contrast  Result Date: 01/06/2017 CLINICAL DATA:  Chest and jaw pain, shortness of breath. Family history CHF. EXAM: CT ANGIOGRAPHY CHEST WITH CONTRAST TECHNIQUE: Multidetector CT imaging of the chest was performed using the standard protocol during bolus administration of intravenous contrast. Multiplanar CT image reconstructions and MIPs were obtained to evaluate the vascular anatomy. CONTRAST:  58 cc Isovue 370 COMPARISON:  Chest radiograph January 06, 2017 at 0057 hours FINDINGS: CARDIOVASCULAR: Adequate contrast opacification of the pulmonary artery's. Main pulmonary artery is not enlarged. No pulmonary arterial filling defects to the level of the subsegmental branches. Heart size is normal, no right heart strain. No pericardial effusion. Thoracic aorta is normal course and caliber, unremarkable. MEDIASTINUM/NODES: No  lymphadenopathy by CT size criteria. Multiple prevertebral and paraspinal ovoid masses measuring to 17 mm short access. LUNGS/PLEURA: Tracheobronchial tree is patent, no pneumothorax. No pleural effusions, focal consolidations, pulmonary nodules or masses. UPPER ABDOMEN: Included view of the abdomen is unremarkable. MUSCULOSKELETAL: 3.7 x 2.4 cm RIGHT breast mass with punctate calcification (image 79/121), previously biopsied revealing PASH. Mild degenerative change of the thoracic spine. No destructive bony lesions. Review of the MIP images confirms the above findings. IMPRESSION: 1. No acute pulmonary embolism. 2. Multiple prevertebral paraspinal masses, differential diagnosis includes extramedullary hematopoiesis and metastasis/lymphadenopathy. Recommend correlation for laboratory analysis for anemia and lymphoproliferative disease. Critical Value/emergent results were called by telephone at the time of interpretation on 01/06/2017 at 3:39 am to Dr. Delora Fuel , who verbally acknowledged these results. Electronically Signed   By: Elon Alas M.D.   On: 01/06/2017 03:40    Procedures Procedures (including critical care time)  Medications Ordered in ED Medications  ketorolac (TORADOL) 30 MG/ML injection 30 mg (30 mg Intravenous Given 01/06/17 0004)  morphine 4 MG/ML injection 4 mg (4 mg Intravenous Given 01/06/17 0218)  iopamidol (ISOVUE-370) 76 % injection (100 mLs  Contrast Given 01/06/17 0235)  morphine 4 MG/ML injection 4 mg (4 mg Intravenous Given 01/06/17 0448)     Initial Impression / Assessment and Plan / ED Course  I have reviewed the triage vital signs and the nursing notes.  Pertinent labs & imaging results that were available during my care of the patient were reviewed by me and considered in my medical decision making (see chart for details).  Chest discomfort of uncertain cause. ECG is unremarkable. She is noted to be tachycardic, so d-dimer was obtained which is elevated. She  was sent for CT angiogram which showed no evidence of pulmonary emboli, but paraspinal mass was seen and this is felt to be possibly lymphoma. I have examined this is a patient. She is referred back to her PCP for further outpatient workup. She had been given morphine for pain in the EDP. This gave her good relief of pain. She is discharged with prescription for Ultram.  Final Clinical Impressions(s) / ED Diagnoses   Final diagnoses:  Nonspecific chest pain  Paraspinal mass    New Prescriptions Discharge Medication List as of 01/06/2017  6:21 AM    START taking these medications   Details  traMADol (ULTRAM) 50 MG tablet Take 1 tablet (50 mg total) by mouth every 6 (six)  hours as needed., Starting Sun 01/06/2017, Print       I personally performed the services described in this documentation, which was scribed in my presence. The recorded information has been reviewed and is accurate.       Delora Fuel, MD 58/31/67 [phone]

## 2017-01-06 ENCOUNTER — Emergency Department (HOSPITAL_COMMUNITY): Payer: 59

## 2017-01-06 LAB — CBC WITH DIFFERENTIAL/PLATELET
BASOS PCT: 0 %
Basophils Absolute: 0 10*3/uL (ref 0.0–0.1)
EOS ABS: 0.7 10*3/uL (ref 0.0–0.7)
EOS PCT: 10 %
HCT: 40.1 % (ref 36.0–46.0)
HEMOGLOBIN: 12.8 g/dL (ref 12.0–15.0)
Lymphocytes Relative: 49 %
Lymphs Abs: 3.4 10*3/uL (ref 0.7–4.0)
MCH: 25.3 pg — ABNORMAL LOW (ref 26.0–34.0)
MCHC: 31.9 g/dL (ref 30.0–36.0)
MCV: 79.2 fL (ref 78.0–100.0)
Monocytes Absolute: 0.5 10*3/uL (ref 0.1–1.0)
Monocytes Relative: 6 %
NEUTROS PCT: 35 %
Neutro Abs: 2.4 10*3/uL (ref 1.7–7.7)
PLATELETS: 205 10*3/uL (ref 150–400)
RBC: 5.06 MIL/uL (ref 3.87–5.11)
RDW: 14.7 % (ref 11.5–15.5)
WBC: 7 10*3/uL (ref 4.0–10.5)

## 2017-01-06 LAB — BASIC METABOLIC PANEL
Anion gap: 7 (ref 5–15)
BUN: 11 mg/dL (ref 6–20)
CALCIUM: 9.7 mg/dL (ref 8.9–10.3)
CO2: 24 mmol/L (ref 22–32)
CREATININE: 1.13 mg/dL — AB (ref 0.44–1.00)
Chloride: 106 mmol/L (ref 101–111)
GFR calc non Af Amer: >60 mL/min (ref >60)
Glucose, Bld: 106 mg/dL — ABNORMAL HIGH (ref 65–99)
Potassium: 4.2 mmol/L (ref 3.5–5.1)
Sodium: 137 mmol/L (ref 135–145)

## 2017-01-06 LAB — HEPATIC FUNCTION PANEL
ALT: 13 U/L — AB (ref 14–54)
AST: 27 U/L (ref 15–41)
Albumin: 3.5 g/dL (ref 3.5–5.0)
Alkaline Phosphatase: 34 U/L — ABNORMAL LOW (ref 38–126)
BILIRUBIN DIRECT: 0.2 mg/dL (ref 0.1–0.5)
BILIRUBIN INDIRECT: 0.4 mg/dL (ref 0.3–0.9)
BILIRUBIN TOTAL: 0.6 mg/dL (ref 0.3–1.2)
Total Protein: 6.7 g/dL (ref 6.5–8.1)

## 2017-01-06 LAB — I-STAT TROPONIN, ED
TROPONIN I, POC: 0 ng/mL (ref 0.00–0.08)
TROPONIN I, POC: 0 ng/mL (ref 0.00–0.08)

## 2017-01-06 LAB — D-DIMER, QUANTITATIVE: D-Dimer, Quant: 1.8 ug/mL-FEU — ABNORMAL HIGH (ref 0.00–0.50)

## 2017-01-06 MED ORDER — MORPHINE SULFATE (PF) 4 MG/ML IV SOLN
4.0000 mg | Freq: Once | INTRAVENOUS | Status: AC
  Administered 2017-01-06: 4 mg via INTRAVENOUS
  Filled 2017-01-06: qty 1

## 2017-01-06 MED ORDER — IOPAMIDOL (ISOVUE-370) INJECTION 76%
INTRAVENOUS | Status: AC
  Administered 2017-01-06: 100 mL
  Filled 2017-01-06: qty 100

## 2017-01-06 MED ORDER — TRAMADOL HCL 50 MG PO TABS: 50.0000 mg | ORAL_TABLET | Freq: Four times a day (QID) | ORAL | 0 refills | Status: DC | PRN

## 2017-01-06 NOTE — ED Notes (Signed)
Taken to xray at this time. 

## 2017-01-06 NOTE — Discharge Instructions (Signed)
Your CT scan showed some masses near your spinal column. This will need additional testing to find out what it is. See your primary care provider this week to arrange additional workup.

## 2017-01-07 ENCOUNTER — Telehealth: Payer: Self-pay | Admitting: Family Medicine

## 2017-01-07 ENCOUNTER — Ambulatory Visit (INDEPENDENT_AMBULATORY_CARE_PROVIDER_SITE_OTHER): Payer: 59 | Admitting: Family Medicine

## 2017-01-07 ENCOUNTER — Encounter: Payer: Self-pay | Admitting: Family Medicine

## 2017-01-07 ENCOUNTER — Telehealth: Payer: Self-pay | Admitting: Internal Medicine

## 2017-01-07 VITALS — BP 130/84 | HR 96 | Wt 232.0 lb

## 2017-01-07 DIAGNOSIS — R222 Localized swelling, mass and lump, trunk: Secondary | ICD-10-CM | POA: Insufficient documentation

## 2017-01-07 DIAGNOSIS — R9389 Abnormal findings on diagnostic imaging of other specified body structures: Secondary | ICD-10-CM | POA: Insufficient documentation

## 2017-01-07 DIAGNOSIS — R938 Abnormal findings on diagnostic imaging of other specified body structures: Secondary | ICD-10-CM | POA: Diagnosis not present

## 2017-01-07 DIAGNOSIS — Z8659 Personal history of other mental and behavioral disorders: Secondary | ICD-10-CM | POA: Insufficient documentation

## 2017-01-07 NOTE — Progress Notes (Signed)
   Subjective:    Patient ID: Bonnie Larsen, female    DOB: 04/11/1981, 36 y.o.   MRN: [id number]  HPI Chief Complaint  Patient presents with  . CT scan    discuss abdnormal CT scan   She is here to discuss abnormal CT results.   She was seen in the ED on July 14th for complaints of chest pain. She had an elevated D-dimer and CT scan of the chest was performed.  States they ruled out a cardiac etiology. Denies having chest pain since her ED visit.   History of panic attacks and states she has not had one in 2 years.   Denies fever, chills, night sweats, unexplained weight loss, fatigue, cough, shortness of breath, abdominal pain, N/V/D, urinary symptoms.   Mammogram is up to date and she had a biopsy with benign findings.  Pap smear this year and it was normal per patient.    Other providers: [facility].   Mirena IUD for contraception. Inserted April 2017.    She does report a mild upper back ache that has lingered since her ED visit but states she has not had to take any pain medication since yesterday. Symptoms are improving.   Family history - brother with a spinal tumor removed. First cousin with a spinal tumor.   Lives with her husband. 2 kids aged 39 and 61.  She works in home for [employer].    Chest CT showed:  IMPRESSION: 1. No acute pulmonary embolism. 2. Multiple prevertebral paraspinal masses, differential diagnosis includes extramedullary hematopoiesis and metastasis/lymphadenopathy. Recommend correlation for laboratory analysis for anemia and lymphoproliferative disease. Critical Value/emergent results were called by telephone at the time of interpretation on 01/06/2017 at 3:39 am to Dr. Delora Fuel , who verbally acknowledged these results.  Electronically Signed   By: Elon Alas M.D.   On: 01/06/2017 03:40   Review of Systems Pertinent positives and negatives in the history of present illness.     Objective:   Physical  Exam BP 130/84   Pulse 96   Wt 232 lb (105.2 kg)   BMI 40.45 kg/m   Alert and oriented and in no acute distress. Not otherwise examined.       Assessment & Plan:  Paraspinal mass - Plan: CT Abdomen Pelvis W Contrast, Ambulatory referral to Hematology  Abnormal chest CT - Plan: CT Abdomen Pelvis W Contrast, Ambulatory referral to Hematology  She is no longer having the chest pain that she was seen in the ED for.  Discussed that we will refer her to hematologist and in the meantime will send her for a CT abdomen and pelvis to look for underlying etiology.  Follow up pending CT results. Peer to peer review was required and done to get approval for her CT.  Follow up pending results.

## 2017-01-07 NOTE — Telephone Encounter (Signed)
Approved until August 30th 2018  [place] # [phone]

## 2017-01-07 NOTE — Telephone Encounter (Signed)
Tried to do a Prior Auth on CT abdomen pelvis with contrast but this needs a Peer to peer review  CPT- 85462 Dx- R22.2- paraspinal mass        R93.8 Case #- [id number] Phone Number [phone]

## 2017-01-07 NOTE — Telephone Encounter (Signed)
Left message for pt to call. Needs an appt asap per vickie.

## 2017-01-09 ENCOUNTER — Ambulatory Visit
Admission: RE | Admit: 2017-01-09 | Discharge: 2017-01-09 | Disposition: A | Discharge status: Home / Self Care | Payer: 59 | Source: Ambulatory Visit | Attending: Family Medicine | Admitting: Family Medicine

## 2017-01-09 DIAGNOSIS — R222 Localized swelling, mass and lump, trunk: Secondary | ICD-10-CM

## 2017-01-09 DIAGNOSIS — R9389 Abnormal findings on diagnostic imaging of other specified body structures: Secondary | ICD-10-CM

## 2017-01-09 MED ORDER — IOPAMIDOL (ISOVUE-300) INJECTION 61%
125.0000 mL | Freq: Once | INTRAVENOUS | Status: AC | PRN
  Administered 2017-01-09: 125 mL via INTRAVENOUS

## 2017-01-10 ENCOUNTER — Other Ambulatory Visit: Payer: Self-pay | Admitting: Family Medicine

## 2017-01-10 DIAGNOSIS — R222 Localized swelling, mass and lump, trunk: Secondary | ICD-10-CM

## 2017-01-10 DIAGNOSIS — R599 Enlarged lymph nodes, unspecified: Secondary | ICD-10-CM | POA: Insufficient documentation

## 2017-01-10 DIAGNOSIS — R591 Generalized enlarged lymph nodes: Secondary | ICD-10-CM | POA: Insufficient documentation

## 2017-01-10 HISTORY — DX: Enlarged lymph nodes, unspecified: R59.9

## 2017-01-10 NOTE — Progress Notes (Signed)
I have sent a message to referral lady about appointment.  Pt called back and wants a work note to go back to work on tuesday

## 2017-01-10 NOTE — Progress Notes (Signed)
Pt was notified letter is ready

## 2017-01-11 ENCOUNTER — Telehealth: Payer: Self-pay | Admitting: Hematology

## 2017-01-11 NOTE — Telephone Encounter (Signed)
Appt has been scheduled for the Bonnie Larsen to see Dr. [name] on 7/23 at 1215pm. Bonnie Larsen aware ot arrive 15 minutes early. Voiced understanding.

## 2017-01-14 ENCOUNTER — Ambulatory Visit (HOSPITAL_BASED_OUTPATIENT_CLINIC_OR_DEPARTMENT_OTHER): Payer: 59 | Admitting: Hematology

## 2017-01-14 ENCOUNTER — Other Ambulatory Visit (HOSPITAL_BASED_OUTPATIENT_CLINIC_OR_DEPARTMENT_OTHER): Payer: 59

## 2017-01-14 ENCOUNTER — Encounter: Payer: Self-pay | Admitting: Hematology

## 2017-01-14 VITALS — BP 119/83 | HR 109 | Temp 98.6°F | Resp 20 | Ht 63.5 in | Wt 227.1 lb

## 2017-01-14 DIAGNOSIS — R590 Localized enlarged lymph nodes: Secondary | ICD-10-CM

## 2017-01-14 DIAGNOSIS — M899 Disorder of bone, unspecified: Secondary | ICD-10-CM | POA: Diagnosis not present

## 2017-01-14 LAB — CBC & DIFF AND RETIC
BASO%: 0.6 % (ref 0.0–2.0)
Basophils Absolute: 0 10*3/uL (ref 0.0–0.1)
EOS ABS: 0.6 10*3/uL — AB (ref 0.0–0.5)
EOS%: 9.1 % — ABNORMAL HIGH (ref 0.0–7.0)
HCT: 46.4 % (ref 34.8–46.6)
HEMOGLOBIN: 14.9 g/dL (ref 11.6–15.9)
Immature Retic Fract: 0.9 % — ABNORMAL LOW (ref 1.60–10.00)
LYMPH%: 44.9 % (ref 14.0–49.7)
MCH: 25.7 pg (ref 25.1–34.0)
MCHC: 32.1 g/dL (ref 31.5–36.0)
MCV: 80.1 fL (ref 79.5–101.0)
MONO#: 0.5 10*3/uL (ref 0.1–0.9)
MONO%: 6.9 % (ref 0.0–14.0)
NEUT%: 38.5 % (ref 38.4–76.8)
NEUTROS ABS: 2.6 10*3/uL (ref 1.5–6.5)
Platelets: 227 10*3/uL (ref 145–400)
RBC: 5.79 10*6/uL — AB (ref 3.70–5.45)
RDW: 14 % (ref 11.2–14.5)
RETIC %: 1.22 % (ref 0.70–2.10)
Retic Ct Abs: 70.64 10*3/uL (ref 33.70–90.70)
WBC: 6.8 10*3/uL (ref 3.9–10.3)
lymph#: 3.1 10*3/uL (ref 0.9–3.3)

## 2017-01-14 LAB — COMPREHENSIVE METABOLIC PANEL
ALK PHOS: 48 U/L (ref 40–150)
ALT: 8 U/L (ref 0–55)
AST: 16 U/L (ref 5–34)
Albumin: 4.2 g/dL (ref 3.5–5.0)
Anion Gap: 11 mEq/L (ref 3–11)
BUN: 9.1 mg/dL (ref 7.0–26.0)
CHLORIDE: 104 meq/L (ref 98–109)
CO2: 25 meq/L (ref 22–29)
Calcium: 10 mg/dL (ref 8.4–10.4)
Creatinine: 1 mg/dL (ref 0.6–1.1)
EGFR: 79 mL/min/{1.73_m2} — AB (ref >90)
GLUCOSE: 89 mg/dL (ref 70–140)
Potassium: 4.2 mEq/L (ref 3.5–5.1)
Sodium: 140 mEq/L (ref 136–145)
Total Bilirubin: 0.6 mg/dL (ref 0.20–1.20)
Total Protein: 8.1 g/dL (ref 6.4–8.3)

## 2017-01-14 LAB — LACTATE DEHYDROGENASE: LDH: 193 U/L (ref 125–245)

## 2017-01-14 NOTE — Patient Instructions (Signed)
Thank you for choosing [facility] to provide your oncology and hematology care.  To afford each patient quality time with our providers, please arrive 30 minutes before your scheduled appointment time.  If you arrive late for your appointment, you may be asked to reschedule.  We strive to give you quality time with our providers, and arriving late affects you and other patients whose appointments are after yours.  If you are a no show for multiple scheduled visits, you may be dismissed from the clinic at the providers discretion.   Again, thank you for choosing [facility], our hope is that these requests will decrease the amount of time that you wait before being seen by our physicians.  ______________________________________________________________________ Should you have questions after your visit to the  Cancer Center, please contact our office at [phone] between the hours of 8:30 and 4:30 p.m.    Voicemails left after 4:30p.m will not be returned until the following business day.   For prescription refill requests, please have your pharmacy contact us directly.  Please also try to allow 48 hours for prescription requests.   Please contact the scheduling department for questions regarding scheduling.  For scheduling of procedures such as PET scans, CT scans, MRI, Ultrasound, etc please contact central scheduling at [phone].   Resources For Cancer Patients and Caregivers:  American Cancer Society:  800-227-2345  Can help patients locate various types of support and financial assistance Cancer Care: 1-800-813-HOPE (4673) Provides financial assistance, online support groups, medication/co-pay assistance.   [place] DSS:  336-641-3447 Where to apply for food stamps, Medicaid, and utility assistance Medicare Rights Center: 800-333-4114 Helps people with Medicare understand their rights and benefits, navigate the Medicare system, and secure the  quality healthcare they deserve SCAT: 336-333-6589 [place] [employer]'s shared-ride transportation service for eligible riders who have a disability that prevents them from riding the fixed route bus.   For additional information on assistance programs please contact our social worker:   Grier Hock/Abigail Elmore:  [phone] 

## 2017-01-14 NOTE — Progress Notes (Signed)
HEMATOLOGY/ONCOLOGY CONSULTATION NOTE  Date of Service: 01/14/2017  Patient Care Team: Girtha Rm, NP-C as PCP - General (Family Medicine)  CHIEF COMPLAINTS/PURPOSE OF CONSULTATION:  Thoracic and abdominal lymphadenopathy concerning for lymphoproliferative disorder  HISTORY OF PRESENTING ILLNESS:  Bonnie Larsen is a wonderful 36 y.o. female who has been referred to [place] by Dr .Raenette Rover, Laurian Brim, NP-C for evaluation and management of thoracic paraspinal masses and upper abdominal extensive lymphadenopathy.  Patient has a history of uterine fibroids, right breast benign lesion biopsied in 2016, anxiety/panic attacks, migraine headaches and chronic sinus issues who presented to the emergency room brought by EMS for chest pain and shortness of breath about 7-10 days ago. She was noted to be hypertensive with a blood pressure of 180/110 which improved with nitroglycerin. She was also having some back pain and felt like her chest pain was radiating to the back. D-dimer level was borderline elevated. She had a CTA of the chest on 01/06/2017 that showed no evidence of pulmonary embolism. She was however noted to have Multiple prevertebral paraspinal masses, differential diagnosis includes extramedullary hematopoiesis and metastasis/lymphadenopathy. Recommend correlation for laboratory analysis for anemia and lymphoproliferative disease.  Patient was subsequently seen by her primary care physician and had a CT of the abdomen and pelvis with contrast on 01/09/2017 which showed Extensive upper abdominal adenopathy favoring lymphoma/lymphoproliferative disease, tissue diagnosis recommended. No splenomegaly or focal splenic lesion.  She was referred to [place] for further evaluation and management.  She was here with her mother and husband.  She notes no abdominal, or chest trauma. No fevers chills night sweats or unexpected weight loss. Mild constipation but no other change in bowel habits. No GI  bleeding noted. No change in food intake or swallowing. No urinary discomfort or abnormalities. Currently having no chest pain or shortness of breath. Mild upper abdominal discomfort. No other obvious peripheral lymphadenopathy noted.  MEDICAL HISTORY:  Past Medical History:  Diagnosis Date  . Anxiety   . Fibroid   . History of panic attacks   . Migraines    For years    SURGICAL HISTORY: Past Surgical History:  Procedure Laterality Date  . TUBAL LIGATION      SOCIAL HISTORY: Social History   Social History  . Marital status: Married    Spouse name: N/A  . Number of children: N/A  . Years of education: N/A   Occupational History  . Not on file.   Social History Main Topics  . Smoking status: Never Smoker  . Smokeless tobacco: Never Used  . Alcohol use No  . Drug use: No  . Sexual activity: Yes    Partners: Male    Birth control/ protection: IUD   Other Topics Concern  . Not on file   Social History Narrative  . No narrative on file    FAMILY HISTORY: Family History  Problem Relation Age of Onset  . Breast cancer Mother 89  . Fibromyalgia Mother   . Rheum arthritis Mother   . Neuropathy Mother   . Congestive Heart Failure Father        died at age 62  . Cirrhosis Father   . Heart disease Maternal Grandmother   . Diabetes Maternal Grandmother   . Other Brother        Benign spinal tumor  . Cancer Paternal Grandfather        Leukemia    ALLERGIES:  is allergic to clindamycin/lincomycin.  MEDICATIONS:  Current Outpatient Prescriptions  Medication Sig Dispense Refill  . traMADol (ULTRAM) 50 MG tablet Take 1 tablet (50 mg total) by mouth every 6 (six) hours as needed. 15 tablet 0   No current facility-administered medications for this visit.     REVIEW OF SYSTEMS:    10 Point review of Systems was done is negative except as noted above.  PHYSICAL EXAMINATION: ECOG PERFORMANCE STATUS: 1 - Symptomatic but completely ambulatory  . Vitals:     01/14/17 1204  BP: 119/83  Pulse: (!) 109  Resp: 20  Temp: 98.6 F (37 C)   Filed Weights   01/14/17 1204  Weight: 227 lb 1.6 oz (103 kg)   .Body mass index is 39.6 kg/m.  GENERAL:alert, in no acute distress and comfortable SKIN: no acute rashes, no significant lesions EYES: conjunctiva are pink and non-injected, sclera anicteric OROPHARYNX: MMM, no exudates, no oropharyngeal erythema or ulceration NECK: supple, no JVD LYMPH:  no palpable lymphadenopathy in the cervical, axillary or inguinal regions LUNGS: clear to auscultation b/l with normal respiratory effort HEART: regular rate & rhythm ABDOMEN:  Obese, and upper abdominal nonfocal discomfort, no guarding rigidity or rebound ,normoactive bowel sounds , non tender, not distended. Extremity: no pedal edema PSYCH: alert & oriented x 3 with fluent speech NEURO: no focal motor/sensory deficits  LABORATORY DATA:  I have reviewed the data as listed  . CBC Latest Ref Rng & Units 01/14/2017 01/05/2017 09/20/2015  WBC 3.9 - 10.3 10e3/uL 6.8 7.0 9.2  Hemoglobin 11.6 - 15.9 g/dL 14.9 12.8 12.8  Hematocrit 34.8 - 46.6 % 46.4 40.1 38.6  Platelets 145 - 400 10e3/uL 227 205 243    . CMP Latest Ref Rng & Units 01/06/2017 01/05/2017 09/20/2015  Glucose 65 - 99 mg/dL - 106(H) 90  BUN 6 - 20 mg/dL - 11 8  Creatinine 0.44 - 1.00 mg/dL - 1.13(H) 1.01  Sodium 135 - 145 mmol/L - 137 139  Potassium 3.5 - 5.1 mmol/L - 4.2 4.2  Chloride 101 - 111 mmol/L - 106 104  CO2 22 - 32 mmol/L - 24 26  Calcium 8.9 - 10.3 mg/dL - 9.7 8.7  Total Protein 6.5 - 8.1 g/dL 6.7 - 6.9  Total Bilirubin 0.3 - 1.2 mg/dL 0.6 - 0.3  Alkaline Phos 38 - 126 U/L 34(L) - 37  AST 15 - 41 U/L 27 - 15  ALT 14 - 54 U/L 13(L) - 6   Component     Latest Ref Rng & Units 01/14/2017  LDH     125 - 245 U/L 193  HIV Screen 4th Generation wRfx     Non Reactive Non Reactive  Hep C Virus Ab     0.0 - 0.9 s/co ratio <0.1  hCG,Beta Subunit,Qual,Serum     Negative <6 mIU/mL  Negative  Sed Rate     0 - 32 mm/hr 7    RADIOGRAPHIC STUDIES: I have personally reviewed the radiological images as listed and agreed with the findings in the report. Dg Chest 2 View  Result Date: 01/06/2017 CLINICAL DATA:  Sudden onset of chest pain and pressure EXAM: CHEST  2 VIEW COMPARISON:  None. FINDINGS: The heart size and mediastinal contours are within normal limits. Both lungs are clear. The visualized skeletal structures are unremarkable. IMPRESSION: No active cardiopulmonary disease. Electronically Signed   By: Donavan Foil M.D.   On: 01/06/2017 01:28   Ct Angio Chest Pe W And/or Wo Contrast  Result Date: 01/06/2017 CLINICAL DATA:  Chest and jaw pain, shortness  of breath. Family history CHF. EXAM: CT ANGIOGRAPHY CHEST WITH CONTRAST TECHNIQUE: Multidetector CT imaging of the chest was performed using the standard protocol during bolus administration of intravenous contrast. Multiplanar CT image reconstructions and MIPs were obtained to evaluate the vascular anatomy. CONTRAST:  58 cc Isovue 370 COMPARISON:  Chest radiograph January 06, 2017 at 0057 hours FINDINGS: CARDIOVASCULAR: Adequate contrast opacification of the pulmonary artery's. Main pulmonary artery is not enlarged. No pulmonary arterial filling defects to the level of the subsegmental branches. Heart size is normal, no right heart strain. No pericardial effusion. Thoracic aorta is normal course and caliber, unremarkable. MEDIASTINUM/NODES: No lymphadenopathy by CT size criteria. Multiple prevertebral and paraspinal ovoid masses measuring to 17 mm short access. LUNGS/PLEURA: Tracheobronchial tree is patent, no pneumothorax. No pleural effusions, focal consolidations, pulmonary nodules or masses. UPPER ABDOMEN: Included view of the abdomen is unremarkable. MUSCULOSKELETAL: 3.7 x 2.4 cm RIGHT breast mass with punctate calcification (image 79/121), previously biopsied revealing PASH. Mild degenerative change of the thoracic spine. No  destructive bony lesions. Review of the MIP images confirms the above findings. IMPRESSION: 1. No acute pulmonary embolism. 2. Multiple prevertebral paraspinal masses, differential diagnosis includes extramedullary hematopoiesis and metastasis/lymphadenopathy. Recommend correlation for laboratory analysis for anemia and lymphoproliferative disease. Critical Value/emergent results were called by telephone at the time of interpretation on 01/06/2017 at 3:39 am to Dr. Delora Fuel , who verbally acknowledged these results. Electronically Signed   By: Elon Alas M.D.   On: 01/06/2017 03:40   Ct Abdomen Pelvis W Contrast  Result Date: 01/10/2017 CLINICAL DATA:  Paraspinal masses on previous CT chest, assessment for lymphoproliferative disease/ adenopathy in the abdomen EXAM: CT ABDOMEN AND PELVIS WITH CONTRAST TECHNIQUE: Multidetector CT imaging of the abdomen and pelvis was performed using the standard protocol following bolus administration of intravenous contrast. CONTRAST:  1108mL ISOVUE-300 IOPAMIDOL (ISOVUE-300) INJECTION 61% COMPARISON:  01/06/2017 chest CT FINDINGS: Lower chest: Stable paraspinal and periaortic new masses are present, including a 1.5 cm in short axis paraesophageal lesion on image 7/3 which appears stable. Stable solid-appearing right breast lesion at 3.6 by 1.5 cm, image 7/3. Hepatobiliary: Unremarkable Pancreas: Unremarkable Spleen: The spleen measures 11.8 by 6.1 by 8.2 cm (volume = 310 cm^3), without focal lesion identified. 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 Mild scarring along the right kidney upper pole. Stomach/Bowel: Unremarkable Vascular/Lymphatic: Extensive right gastric, peripancreatic, retrocrural, porta hepatis, retroperitoneal, and central mesenteric adenopathy new. A peripancreatic node measures 4.0 cm in short axis on image 23/3. A left periaortic node measures 3.5 cm in short axis on image 32/3. A central mesenteric  node measures 3.7 cm in short axis on image 44/3. Reproductive: An IUD appears satisfactorily positioned along the endometrium. 3.2 by 2.4 cm right adnexal cyst. Other: No supplemental non-categorized findings. Musculoskeletal: Unremarkable IMPRESSION: 1. Extensive upper abdominal adenopathy favoring lymphoma/lymphoproliferative disease, tissue diagnosis recommended. No splenomegaly or focal splenic lesion. 2. 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 No hydronephrosis. 3. 3.2 cm in long axis right adnexal cyst. In the absence of symptoms this usually would not require further workup in this age group. Electronically Signed   By: Van Clines M.D.   On: 01/10/2017 08:19    ASSESSMENT & PLAN:   36 year old African-American female with  #1 Extensive intra-abdominal lymphadenopathy without any early primary source of infection/solid organ tumor. #2 Thoracic paraspinal masses likely lymphadenopathy or metastases .  Overall given the patient's age and radiographic presentation the differential diagnosis would certainly need  to rule out a lymphoproliferative process /lymphoma . Other possibilities could be sarcoidosis , viral infections , atypical lymphoid proliferation from autoimmune conditions , lymphoma mimics (other inflammatory conditions) LDH level and sedimentation rate within normal limits . HIV negative   hepatitis C negative .  Plan -Imaging findings and clinical features discussed in detail with the patient her accompanying husband and mother. -range of diagnostic possibilities discussed and the patient's multiple questions were answered in details. -She is understandably anxious but would like to proceed with definitive workup. -She'll get labs today- done -PET/CT scan will be scheduled in the next 3-5 days to evaluate her lymphadenopathy and rule out another primary process as well as to guide appropriate diagnostic sampling. -We'll request CT-guided core  needle biopsy of one of her large abdominal lymph nodes if possible by interventional radiology. -Further management will be directed by her tissue pathology. -No current significant pain or constitutional symptoms.  #3  Patient Active Problem List   Diagnosis Date Noted  . Adenopathy 01/10/2017  . 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 Abnormal chest CT 01/07/2017  . History of panic attacks   . Bacterial pharyngitis 07/30/2016   -continue f/u with PCP for other chronic medical issues  Labs today PET/CT in 3-5 days CT biopsy of intra abdominal lymph nodes/masses by IR in 3-5 days RTC with Dr [name] in 12-14 days with PET/CT and biopsy results     All of the patients questions were answered with apparent satisfaction. The patient knows to call the clinic with any problems, questions or concerns.  I spent 45 minutes counseling the patient face to face. The total time spent in the appointment was 60 minutes and more than 50% was on counseling and direct patient cares.    Sullivan Lone MD [place] AAHIVMS [facility] Hematology/Oncology Physician [facility]  (Office):       [phone] (Work cell):  [phone] (Fax):           [phone]  01/14/2017 12:40 PM

## 2017-01-15 LAB — HEPATITIS C ANTIBODY: Hep C Virus Ab: <0.1 s/co ratio (ref 0.0–0.9)

## 2017-01-15 LAB — HCG, SERUM, QUALITATIVE: HCG, BETA SUBUNIT, QUAL, SERUM: NEGATIVE m[IU]/mL (ref <6)

## 2017-01-15 LAB — SEDIMENTATION RATE: SED RATE: 7 mm/h (ref 0–32)

## 2017-01-15 LAB — HIV ANTIBODY (ROUTINE TESTING W REFLEX): HIV Screen 4th Generation wRfx: NONREACTIVE

## 2017-01-16 ENCOUNTER — Telehealth: Payer: Self-pay

## 2017-01-16 MED ORDER — TRAMADOL HCL 50 MG PO TABS: 50.0000 mg | ORAL_TABLET | Freq: Four times a day (QID) | ORAL | 0 refills | Status: DC | PRN

## 2017-01-16 NOTE — Telephone Encounter (Signed)
Returning call from yesterday. Pt c/o stomach pain radiating to her back. Inability to eat without forcing herself and nausea r/t the pain. Pt has old prescription from ED for tramadol. New order placed for tramadol 50mg  PO as needed for pain. Pt advised to p/u from [place]. Pt verbalized understanding.

## 2017-01-18 ENCOUNTER — Other Ambulatory Visit: Payer: Self-pay | Admitting: Radiology

## 2017-01-18 ENCOUNTER — Other Ambulatory Visit: Payer: Self-pay | Admitting: General Surgery

## 2017-01-21 ENCOUNTER — Ambulatory Visit (HOSPITAL_COMMUNITY)
Admission: RE | Admit: 2017-01-21 | Discharge: 2017-01-21 | Disposition: A | Discharge status: Home / Self Care | Payer: 59 | Source: Ambulatory Visit | Attending: Hematology | Admitting: Hematology

## 2017-01-21 ENCOUNTER — Encounter (HOSPITAL_COMMUNITY): Payer: Self-pay

## 2017-01-21 DIAGNOSIS — Z79899 Other long term (current) drug therapy: Secondary | ICD-10-CM | POA: Diagnosis not present

## 2017-01-21 DIAGNOSIS — F419 Anxiety disorder, unspecified: Secondary | ICD-10-CM | POA: Diagnosis not present

## 2017-01-21 DIAGNOSIS — G43909 Migraine, unspecified, not intractable, without status migrainosus: Secondary | ICD-10-CM | POA: Diagnosis not present

## 2017-01-21 DIAGNOSIS — R590 Localized enlarged lymph nodes: Secondary | ICD-10-CM | POA: Insufficient documentation

## 2017-01-21 LAB — CBC WITH DIFFERENTIAL/PLATELET
BASOS ABS: 0 10*3/uL (ref 0.0–0.1)
BASOS PCT: 0 %
EOS PCT: 14 %
Eosinophils Absolute: 0.9 10*3/uL — ABNORMAL HIGH (ref 0.0–0.7)
HCT: 42 % (ref 36.0–46.0)
Hemoglobin: 14.1 g/dL (ref 12.0–15.0)
LYMPHS PCT: 45 %
Lymphs Abs: 2.9 10*3/uL (ref 0.7–4.0)
MCH: 25.9 pg — ABNORMAL LOW (ref 26.0–34.0)
MCHC: 33.6 g/dL (ref 30.0–36.0)
MCV: 77.1 fL — AB (ref 78.0–100.0)
MONO ABS: 0.6 10*3/uL (ref 0.1–1.0)
Monocytes Relative: 9 %
Neutro Abs: 2 10*3/uL (ref 1.7–7.7)
Neutrophils Relative %: 32 %
PLATELETS: 218 10*3/uL (ref 150–400)
RBC: 5.45 MIL/uL — ABNORMAL HIGH (ref 3.87–5.11)
RDW: 14 % (ref 11.5–15.5)
WBC: 6.4 10*3/uL (ref 4.0–10.5)

## 2017-01-21 LAB — PROTIME-INR
INR: 0.96
Prothrombin Time: 12.8 seconds (ref 11.4–15.2)

## 2017-01-21 MED ORDER — SODIUM CHLORIDE 0.9 % IV SOLN
INTRAVENOUS | Status: DC
  Administered 2017-01-21: 07:00:00 via INTRAVENOUS

## 2017-01-21 MED ORDER — FENTANYL CITRATE (PF) 100 MCG/2ML IJ SOLN
INTRAMUSCULAR | Status: AC | PRN
  Administered 2017-01-21: 50 ug via INTRAVENOUS
  Administered 2017-01-21: 25 ug via INTRAVENOUS

## 2017-01-21 MED ORDER — FENTANYL CITRATE (PF) 100 MCG/2ML IJ SOLN
INTRAMUSCULAR | Status: AC
  Filled 2017-01-21: qty 4

## 2017-01-21 MED ORDER — MIDAZOLAM HCL 2 MG/2ML IJ SOLN
INTRAMUSCULAR | Status: AC | PRN
  Administered 2017-01-21: 1 mg via INTRAVENOUS
  Administered 2017-01-21: 0.5 mg via INTRAVENOUS

## 2017-01-21 MED ORDER — MIDAZOLAM HCL 2 MG/2ML IJ SOLN
INTRAMUSCULAR | Status: AC
  Filled 2017-01-21: qty 4

## 2017-01-21 NOTE — Consult Note (Signed)
Chief Complaint: Patient was seen in consultation today for CT guided left retroperitoneal lymph node biopsy  Referring Physician(s): Brunetta Genera  Supervising Physician: Corrie Mckusick  Patient Status: [facility] - Out-pt  History of Present Illness: Bonnie Larsen is a 36 y.o. female with prior history of chest pain, dyspnea, abdominal pain and recent imaging which revealed multiple prevertebral paraspinal masses with extensive upper abdominal adenopathy suspicious for lymphoma. She presents today for CT-guided left retroperitoneal lymph node biopsy for further evaluation.  Past Medical History:  Diagnosis Date  . Anxiety   . Fibroid   . History of panic attacks   . Migraines    For years    Past Surgical History:  Procedure Laterality Date  . TUBAL LIGATION      Allergies: Clindamycin/lincomycin  Medications: Prior to Admission medications   Medication Sig Start Date End Date Taking? Authorizing Provider  traMADol (ULTRAM) 50 MG tablet Take 1 tablet (50 mg total) by mouth every 6 (six) hours as needed for severe pain. 01/16/17  Yes Brunetta Genera, MD  LORazepam (ATIVAN) 1 MG tablet Take 1 mg by mouth every 8 (eight) hours.    [provider]     Family History  Problem Relation Age of Onset  . Breast cancer Mother 25  . Fibromyalgia Mother   . Rheum arthritis Mother   . Neuropathy Mother   . Congestive Heart Failure Father        died at age 66  . Cirrhosis Father   . Heart disease Maternal Grandmother   . Diabetes Maternal Grandmother   . Other Brother        Benign spinal tumor  . Cancer Paternal Grandfather        Leukemia    Social History   Social History  . Marital status: Married    Spouse name: N/A  . Number of children: N/A  . Years of education: N/A   Social History Main Topics  . Smoking status: Never Smoker  . Smokeless tobacco: Never Used  . Alcohol use No  . Drug use: No  . Sexual activity: Yes    Partners: Male    Birth control/ protection: IUD   Other Topics Concern  . None   Social History Narrative  . None     Review of Systems currently denies fever, headache, chest pain, dyspnea, cough, nausea, vomiting or abnormal bleeding. She does have left lower quadrant as well as back discomfort.  Vital Signs: Vitals:   01/21/17 0705  BP: 130/88  Pulse: 89  Resp: 16  Temp: 98.7 F (37.1 C)      Physical Exam awake, alert. Chest clear to auscultation bilaterally. Heart with regular rate and rhythm. Abdomen soft, obese, positive bowel sounds, mild left lower quadrant tenderness. No significant lower extremity edema.  Mallampati Score:     Imaging: Dg Chest 2 View  Result Date: 01/06/2017 CLINICAL DATA:  Sudden onset of chest pain and pressure EXAM: CHEST  2 VIEW COMPARISON:  None. FINDINGS: The heart size and mediastinal contours are within normal limits. Both lungs are clear. The visualized skeletal structures are unremarkable. IMPRESSION: No active cardiopulmonary disease. Electronically Signed   By: Donavan Foil M.D.   On: 01/06/2017 01:28   Ct Angio Chest Pe W And/or Wo Contrast  Result Date: 01/06/2017 CLINICAL DATA:  Chest and jaw pain, shortness of breath. Family history CHF. EXAM: CT ANGIOGRAPHY CHEST WITH CONTRAST TECHNIQUE: Multidetector CT imaging of the chest was performed  using the standard protocol during bolus administration of intravenous contrast. Multiplanar CT image reconstructions and MIPs were obtained to evaluate the vascular anatomy. CONTRAST:  58 cc Isovue 370 COMPARISON:  Chest radiograph January 06, 2017 at 0057 hours FINDINGS: CARDIOVASCULAR: Adequate contrast opacification of the pulmonary artery's. Main pulmonary artery is not enlarged. No pulmonary arterial filling defects to the level of the subsegmental branches. Heart size is normal, no right heart strain. No pericardial effusion. Thoracic aorta is normal course and caliber, unremarkable. MEDIASTINUM/NODES: No  lymphadenopathy by CT size criteria. Multiple prevertebral and paraspinal ovoid masses measuring to 17 mm short access. LUNGS/PLEURA: Tracheobronchial tree is patent, no pneumothorax. No pleural effusions, focal consolidations, pulmonary nodules or masses. UPPER ABDOMEN: Included view of the abdomen is unremarkable. MUSCULOSKELETAL: 3.7 x 2.4 cm RIGHT breast mass with punctate calcification (image 79/121), previously biopsied revealing PASH. Mild degenerative change of the thoracic spine. No destructive bony lesions. Review of the MIP images confirms the above findings. IMPRESSION: 1. No acute pulmonary embolism. 2. Multiple prevertebral paraspinal masses, differential diagnosis includes extramedullary hematopoiesis and metastasis/lymphadenopathy. Recommend correlation for laboratory analysis for anemia and lymphoproliferative disease. Critical Value/emergent results were called by telephone at the time of interpretation on 01/06/2017 at 3:39 am to Dr. Delora Fuel , who verbally acknowledged these results. Electronically Signed   By: Elon Alas M.D.   On: 01/06/2017 03:40   Ct Abdomen Pelvis W Contrast  Result Date: 01/10/2017 CLINICAL DATA:  Paraspinal masses on previous CT chest, assessment for lymphoproliferative disease/ adenopathy in the abdomen EXAM: CT ABDOMEN AND PELVIS WITH CONTRAST TECHNIQUE: Multidetector CT imaging of the abdomen and pelvis was performed using the standard protocol following bolus administration of intravenous contrast. CONTRAST:  137mL ISOVUE-300 IOPAMIDOL (ISOVUE-300) INJECTION 61% COMPARISON:  01/06/2017 chest CT FINDINGS: Lower chest: Stable paraspinal and periaortic new masses are present, including a 1.5 cm in short axis paraesophageal lesion on image 7/3 which appears stable. Stable solid-appearing right breast lesion at 3.6 by 1.5 cm, image 7/3. Hepatobiliary: Unremarkable Pancreas: Unremarkable Spleen: The spleen measures 11.8 by 6.1 by 8.2 cm (volume = 310 cm^3),  without focal lesion identified. 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 Mild scarring along the right kidney upper pole. Stomach/Bowel: Unremarkable Vascular/Lymphatic: Extensive right gastric, peripancreatic, retrocrural, porta hepatis, retroperitoneal, and central mesenteric adenopathy new. A peripancreatic node measures 4.0 cm in short axis on image 23/3. A left periaortic node measures 3.5 cm in short axis on image 32/3. A central mesenteric node measures 3.7 cm in short axis on image 44/3. Reproductive: An IUD appears satisfactorily positioned along the endometrium. 3.2 by 2.4 cm right adnexal cyst. Other: No supplemental non-categorized findings. Musculoskeletal: Unremarkable IMPRESSION: 1. Extensive upper abdominal adenopathy favoring lymphoma/lymphoproliferative disease, tissue diagnosis recommended. No splenomegaly or focal splenic lesion. 2. 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 No hydronephrosis. 3. 3.2 cm in long axis right adnexal cyst. In the absence of symptoms this usually would not require further workup in this age group. Electronically Signed   By: Van Clines M.D.   On: 01/10/2017 08:19    Labs:  CBC:  Recent Labs  01/05/17 2348 01/14/17 1410 01/21/17 0719  WBC 7.0 6.8 6.4  HGB 12.8 14.9 14.1  HCT 40.1 46.4 42.0  PLT 205 227 218    COAGS:  Recent Labs  01/21/17 0719  INR 0.96    BMP:  Recent Labs  01/05/17 2348 01/14/17 1410  NA 137 140  K 4.2 4.2  CL 106  --   CO2 24 25  GLUCOSE 106* 89  BUN 11 9.1  CALCIUM 9.7 10.0  CREATININE 1.13* 1.0  GFRNONAA >60  --   GFRAA >60  --     LIVER FUNCTION TESTS:  Recent Labs  01/06/17 0457 01/14/17 1410  BILITOT 0.6 0.60  AST 27 16  ALT 13* 8  ALKPHOS 34* 48  PROT 6.7 8.1  ALBUMIN 3.5 4.2    TUMOR MARKERS: No results for input(s): AFPTM, CEA, CA199, CHROMGRNA in the last 8760 hours.  Assessment and Plan: 36 y.o.  female with prior history of chest pain, dyspnea, abdominal pain and recent imaging which revealed multiple prevertebral paraspinal masses with extensive upper abdominal adenopathy suspicious for lymphoma. She presents today for CT-guided left retroperitoneal lymph node biopsy for further evaluation.Risks and benefits discussed with the patient/family including, but not limited to bleeding, infection, damage to adjacent structures or low yield requiring additional tests.All of the patient's questions were answered, patient is agreeable to proceed.Consent signed and in chart.     Thank you for this interesting consult.  I greatly enjoyed meeting Bonnie Larsen and look forward to participating in their care.  A copy of this report was sent to the requesting provider on this date.  Electronically Signed: D. Rowe Robert, PA-C 01/21/2017, 8:32 AM   I spent a total of 25 minutes  in face to face in clinical consultation, greater than 50% of which was counseling/coordinating care for CT-guided left retroperitoneal lymph node biopsy

## 2017-01-21 NOTE — Sedation Documentation (Signed)
Patient is resting comfortably. 

## 2017-01-21 NOTE — Discharge Instructions (Signed)
Moderate Conscious Sedation, Adult, Care After These instructions provide you with information about caring for yourself after your procedure. Your health care provider may also give you more specific instructions. Your treatment has been planned according to current medical practices, but problems sometimes occur. Call your health care provider if you have any problems or questions after your procedure. What can I expect after the procedure? After your procedure, it is common:  To feel sleepy for several hours.  To feel clumsy and have poor balance for several hours.  To have poor judgment for several hours.  To vomit if you eat too soon.  Follow these instructions at home: For at least 24 hours after the procedure:   Do not: ? Participate in activities where you could fall or become injured. ? Drive. ? Use heavy machinery. ? Drink alcohol. ? Take sleeping pills or medicines that cause drowsiness. ? Make important decisions or sign legal documents. ? Take care of children on your own.  Rest. Eating and drinking  Follow the diet recommended by your health care provider.  If you vomit: ? Drink water, juice, or soup when you can drink without vomiting. ? Make sure you have little or no nausea before eating solid foods. General instructions  Have a responsible adult stay with you until you are awake and alert.  Take over-the-counter and prescription medicines only as told by your health care provider.  If you smoke, do not smoke without supervision.  Keep all follow-up visits as told by your health care provider. This is important. Contact a health care provider if:  You keep feeling nauseous or you keep vomiting.  You feel light-headed.  You develop a rash.  You have a fever. Get help right away if:  You have trouble breathing. This information is not intended to replace advice given to you by your health care provider. Make sure you discuss any questions you have  with your health care provider. Document Released: 04/01/2013 Document Revised: 11/14/2015 Document Reviewed: 10/01/2015 Elsevier Interactive Patient Education  2018 [employer].   Needle Biopsy, Care After These instructions give you information about caring for yourself after your procedure. Your doctor may also give you more specific instructions. Call your doctor if you have any problems or questions after your procedure. Follow these instructions at home:  Rest as told by your doctor.  Take medicines only as told by your doctor.  There are many different ways to close and cover the biopsy site, including stitches (sutures), skin glue, and adhesive strips. Follow instructions from your doctor about: ? How to take care of your biopsy site. ? Remove dressing tomorrow Tuesday 01/22/17. ? You may shower tomorrow Tuesday 01/22/17.  Check your biopsy site every day for signs of infection. Watch for: ? Redness, swelling, or pain. ? Fluid, blood, or pus. Contact a doctor if:  You have a fever.  You have redness, swelling, or pain at the biopsy site, and it lasts longer than a few days.  You have fluid, blood, or pus coming from the biopsy site.  You feel sick to your stomach (nauseous).  You throw up (vomit). Get help right away if:  You are short of breath.  You have trouble breathing.  Your chest hurts.  You feel dizzy or you pass out (faint).  You have bleeding that does not stop with pressure or a bandage.  You cough up blood.  Your belly (abdomen) hurts. This information is not intended to replace advice given  to you by your health care provider. Make sure you discuss any questions you have with your health care provider. Document Released: 05/24/2008 Document Revised: 11/17/2015 Document Reviewed: 06/07/2014 Elsevier Interactive Patient Education  [employer].

## 2017-01-21 NOTE — Procedures (Signed)
Interventional Radiology Procedure Note  Procedure: CT guided node biopsy, left retroperitoneal.  Complications: None Recommendations:  - 1 hour recovery - Ok to shower tomorrow - Do not submerge for 7 days - Routine wound care   Signed,  Dulcy Fanny. Earleen Newport, DO

## 2017-01-21 NOTE — Sedation Documentation (Signed)
Pt resting, reports "feeling anxious" additional sedation medication given as discussed with Dr. Earleen Newport

## 2017-01-22 ENCOUNTER — Other Ambulatory Visit (HOSPITAL_COMMUNITY): Admit: 2017-01-22 | Payer: Self-pay

## 2017-01-23 DIAGNOSIS — C8590 Non-Hodgkin lymphoma, unspecified, unspecified site: Secondary | ICD-10-CM

## 2017-01-23 DIAGNOSIS — R590 Localized enlarged lymph nodes: Secondary | ICD-10-CM

## 2017-01-23 HISTORY — DX: Non-Hodgkin lymphoma, unspecified, unspecified site: C85.90

## 2017-01-23 HISTORY — DX: Localized enlarged lymph nodes: R59.0

## 2017-01-31 ENCOUNTER — Telehealth: Payer: Self-pay | Admitting: *Deleted

## 2017-01-31 ENCOUNTER — Ambulatory Visit: Payer: 59 | Admitting: Hematology

## 2017-01-31 ENCOUNTER — Telehealth: Payer: Self-pay | Admitting: Hematology

## 2017-01-31 NOTE — Telephone Encounter (Signed)
sw pt to confirm r/s appt 8/13 at 1120 per sch msg for early next week

## 2017-01-31 NOTE — Telephone Encounter (Signed)
Per Dr. [name], MD apt today should be rescheduled due to not having PET scan.  PET scheduled 8/17.  Anderson with patient to reschedule MD apt to early next week in order to have all results.  Scheduling message sent.  Dr. [name] aware.

## 2017-02-01 ENCOUNTER — Ambulatory Visit (HOSPITAL_COMMUNITY)
Admission: RE | Admit: 2017-02-01 | Discharge: 2017-02-01 | Disposition: A | Discharge status: Home / Self Care | Payer: 59 | Source: Ambulatory Visit | Attending: Hematology | Admitting: Hematology

## 2017-02-01 ENCOUNTER — Encounter (HOSPITAL_COMMUNITY): Payer: Self-pay | Admitting: Radiology

## 2017-02-01 DIAGNOSIS — N6489 Other specified disorders of breast: Secondary | ICD-10-CM | POA: Diagnosis not present

## 2017-02-01 DIAGNOSIS — R590 Localized enlarged lymph nodes: Secondary | ICD-10-CM | POA: Insufficient documentation

## 2017-02-01 DIAGNOSIS — R938 Abnormal findings on diagnostic imaging of other specified body structures: Secondary | ICD-10-CM | POA: Insufficient documentation

## 2017-02-01 DIAGNOSIS — Z975 Presence of (intrauterine) contraceptive device: Secondary | ICD-10-CM | POA: Diagnosis not present

## 2017-02-01 LAB — GLUCOSE, CAPILLARY: GLUCOSE-CAPILLARY: 91 mg/dL (ref 65–99)

## 2017-02-01 MED ORDER — FLUDEOXYGLUCOSE F - 18 (FDG) INJECTION
10.8000 | Freq: Once | INTRAVENOUS | Status: AC | PRN
  Administered 2017-02-01: 10.8 via INTRAVENOUS

## 2017-02-04 ENCOUNTER — Encounter: Payer: Self-pay | Admitting: Hematology

## 2017-02-04 ENCOUNTER — Ambulatory Visit (HOSPITAL_BASED_OUTPATIENT_CLINIC_OR_DEPARTMENT_OTHER): Payer: 59 | Admitting: Hematology

## 2017-02-04 ENCOUNTER — Ambulatory Visit (HOSPITAL_BASED_OUTPATIENT_CLINIC_OR_DEPARTMENT_OTHER): Payer: 59

## 2017-02-04 ENCOUNTER — Telehealth: Payer: Self-pay | Admitting: Hematology

## 2017-02-04 VITALS — BP 121/71 | HR 80 | Temp 99.2°F | Resp 20 | Ht 63.5 in | Wt 227.8 lb

## 2017-02-04 DIAGNOSIS — R590 Localized enlarged lymph nodes: Secondary | ICD-10-CM | POA: Diagnosis not present

## 2017-02-04 DIAGNOSIS — M899 Disorder of bone, unspecified: Secondary | ICD-10-CM | POA: Diagnosis not present

## 2017-02-04 DIAGNOSIS — R3915 Urgency of urination: Secondary | ICD-10-CM

## 2017-02-04 LAB — URINALYSIS, MICROSCOPIC - CHCC
BLOOD: NEGATIVE
Bilirubin (Urine): NEGATIVE
GLUCOSE UR CHCC: NEGATIVE mg/dL
KETONES: NEGATIVE mg/dL
Leukocyte Esterase: NEGATIVE
Nitrite: NEGATIVE
PH: 6.5 (ref 4.6–8.0)
Protein: <30 mg/dL
SPECIFIC GRAVITY, URINE: 1.02 (ref 1.003–1.035)
Urobilinogen, UR: 0.2 mg/dL (ref 0.2–1)

## 2017-02-04 NOTE — Telephone Encounter (Signed)
Called [facility] and faxing over referral information for an ASAP liver biopsy.

## 2017-02-04 NOTE — Patient Instructions (Signed)
Thank you for choosing [facility] to provide your oncology and hematology care.  To afford each patient quality time with our providers, please arrive 30 minutes before your scheduled appointment time.  If you arrive late for your appointment, you may be asked to reschedule.  We strive to give you quality time with our providers, and arriving late affects you and other patients whose appointments are after yours.   If you are a no show for multiple scheduled visits, you may be dismissed from the clinic at the providers discretion.    Again, thank you for choosing [facility], our hope is that these requests will decrease the amount of time that you wait before being seen by our physicians.  ______________________________________________________________________  Should you have questions after your visit to the [facility], please contact our office at [phone] between the hours of 8:30 and 4:30 p.m.    Voicemails left after 4:30p.m will not be returned until the following business day.    For prescription refill requests, please have your pharmacy contact us directly.  Please also try to allow 48 hours for prescription requests.    Please contact the scheduling department for questions regarding scheduling.  For scheduling of procedures such as PET scans, CT scans, MRI, Ultrasound, etc please contact central scheduling at [phone].    Resources For Cancer Patients and Caregivers:   Oncolink.org:  A wonderful resource for patients and healthcare providers for information regarding your disease, ways to tract your treatment, what to expect, etc.     American Cancer Society:  800-227-2345  Can help patients locate various types of support and financial assistance  Cancer Care: 1-800-813-HOPE (4673) Provides financial assistance, online support groups, medication/co-pay assistance.    [place] DSS:  336-641-3447 Where to apply for food  stamps, Medicaid, and utility assistance  Medicare Rights Center: 800-333-4114 Helps people with Medicare understand their rights and benefits, navigate the Medicare system, and secure the quality healthcare they deserve  SCAT: 336-333-6589 [place] [employer]'s shared-ride transportation service for eligible riders who have a disability that prevents them from riding the fixed route bus.    For additional information on assistance programs please contact our social worker:   Grier Hock/Abigail Elmore:  [phone]            

## 2017-02-05 ENCOUNTER — Other Ambulatory Visit: Payer: Self-pay | Admitting: General Surgery

## 2017-02-05 ENCOUNTER — Telehealth: Payer: Self-pay | Admitting: *Deleted

## 2017-02-05 LAB — URINE CULTURE

## 2017-02-05 NOTE — Telephone Encounter (Signed)
-----   Message from Brunetta Genera, MD sent at 02/05/2017  7:32 AM EDT ----- Plz let patient know - no evidence of UTI based on UA sample. thx GK

## 2017-02-05 NOTE — Progress Notes (Signed)
HEMATOLOGY/ONCOLOGY CLINIC NOTE  Date of Service: .02/04/2017  Patient Care Team: Girtha Rm, NP-C as PCP - General (Family Medicine)  CHIEF COMPLAINTS/PURPOSE OF CONSULTATION:  Thoracic and abdominal lymphadenopathy concerning for lymphoproliferative disorder  HISTORY OF PRESENTING ILLNESS:  Bonnie Larsen is a wonderful 36 y.o. female who has been referred to [place] by Dr .Raenette Rover, Laurian Brim, NP-C for evaluation and management of thoracic paraspinal masses and upper abdominal extensive lymphadenopathy.  Patient has a history of uterine fibroids, right breast benign lesion biopsied in 2016, anxiety/panic attacks, migraine headaches and chronic sinus issues who presented to the emergency room brought by EMS for chest pain and shortness of breath about 7-10 days ago. She was noted to be hypertensive with a blood pressure of 180/110 which improved with nitroglycerin. She was also having some back pain and felt like her chest pain was radiating to the back. D-dimer level was borderline elevated. She had a CTA of the chest on 01/06/2017 that showed no evidence of pulmonary embolism. She was however noted to have Multiple prevertebral paraspinal masses, differential diagnosis includes extramedullary hematopoiesis and metastasis/lymphadenopathy. Recommend correlation for laboratory analysis for anemia and lymphoproliferative disease.  Patient was subsequently seen by her primary care physician and had a CT of the abdomen and pelvis with contrast on 01/09/2017 which showed Extensive upper abdominal adenopathy favoring lymphoma/lymphoproliferative disease, tissue diagnosis recommended. No splenomegaly or focal splenic lesion.  She was referred to [place] for further evaluation and management.  She was here with her mother and husband.  She notes no abdominal, or chest trauma. No fevers chills night sweats or unexpected weight loss. Mild constipation but no other change in bowel habits. No GI bleeding  noted. No change in food intake or swallowing. No urinary discomfort or abnormalities. Currently having no chest pain or shortness of breath. Mild upper abdominal discomfort. No other obvious peripheral lymphadenopathy noted.  INTERVAL HISTORY  Patient is here for follow-up to discuss the results of her PET CT scan and biopsy results. She notes no acute new symptoms. PET/CT images were reviewed and show pattern certainly concerning for lymphoma. I reviewed the pathology results in details with the pathologist Dr Monica Martinez and the patient. It is certainly suspicious for Hodgkin's lymphoma though as per the pathologist there isn't enough tissue on the core needle biopsies to give a definitive diagnosis. I discussed this with the patient. We went through options of possibly repeating core needle biopsies versus getting a excisional lymph node biopsy in consultation with surgery. We discussed an excisional lymph node biopsy certainly would have a much higher diagnostic yield and she prefers to explore this option to prevent delays in diagnosis.   MEDICAL HISTORY:  Past Medical History:  Diagnosis Date  . Anxiety   . Fibroid   . History of panic attacks   . Migraines    For years    SURGICAL HISTORY: Past Surgical History:  Procedure Laterality Date  . TUBAL LIGATION      SOCIAL HISTORY: Social History   Social History  . Marital status: Married    Spouse name: N/A  . Number of children: N/A  . Years of education: N/A   Occupational History  . Not on file.   Social History Main Topics  . Smoking status: Never Smoker  . Smokeless tobacco: Never Used  . Alcohol use No  . Drug use: No  . Sexual activity: Yes    Partners: Male    Birth control/ protection: IUD  Other Topics Concern  . Not on file   Social History Narrative  . No narrative on file    FAMILY HISTORY: Family History  Problem Relation Age of Onset  . Breast cancer Mother 43  . Fibromyalgia Mother   .  Rheum arthritis Mother   . Neuropathy Mother   . Congestive Heart Failure Father        died at age 23  . Cirrhosis Father   . Heart disease Maternal Grandmother   . Diabetes Maternal Grandmother   . Other Brother        Benign spinal tumor  . Cancer Paternal Grandfather        Leukemia    ALLERGIES:  is allergic to clindamycin/lincomycin.  MEDICATIONS:  Current Outpatient Prescriptions  Medication Sig Dispense Refill  . traMADol (ULTRAM) 50 MG tablet Take 1 tablet (50 mg total) by mouth every 6 (six) hours as needed for severe pain. 30 tablet 0  . LORazepam (ATIVAN) 1 MG tablet Take 1 mg by mouth every 8 (eight) hours.     No current facility-administered medications for this visit.     REVIEW OF SYSTEMS:    10 Point review of Systems was done is negative except as noted above.  PHYSICAL EXAMINATION: ECOG PERFORMANCE STATUS: 1 - Symptomatic but completely ambulatory  . Vitals:   02/04/17 1149  BP: 121/71  Pulse: 80  Resp: 20  Temp: 99.2 F (37.3 C)  SpO2: 100%   Filed Weights   02/04/17 1149  Weight: 227 lb 12.8 oz (103.3 kg)   .Body mass index is 39.72 kg/m.  GENERAL:alert, in no acute distress and comfortable SKIN: no acute rashes, no significant lesions EYES: conjunctiva are pink and non-injected, sclera anicteric OROPHARYNX: MMM, no exudates, no oropharyngeal erythema or ulceration NECK: supple, no JVD LYMPH:  no palpable lymphadenopathy in the cervical, axillary or inguinal regions LUNGS: clear to auscultation b/l with normal respiratory effort HEART: regular rate & rhythm ABDOMEN:  Obese, and upper abdominal nonfocal discomfort, no guarding rigidity or rebound ,normoactive bowel sounds , non tender, not distended. Extremity: no pedal edema PSYCH: alert & oriented x 3 with fluent speech NEURO: no focal motor/sensory deficits  LABORATORY DATA:  I have reviewed the data as listed  . CBC Latest Ref Rng & Units 01/21/2017 01/14/2017 01/05/2017  WBC 4.0  - 10.5 K/uL 6.4 6.8 7.0  Hemoglobin 12.0 - 15.0 g/dL 14.1 14.9 12.8  Hematocrit 36.0 - 46.0 % 42.0 46.4 40.1  Platelets 150 - 400 K/uL 218 227 205    . CMP Latest Ref Rng & Units 01/14/2017 01/06/2017 01/05/2017  Glucose 70 - 140 mg/dl 89 - 106(H)  BUN 7.0 - 26.0 mg/dL 9.1 - 11  Creatinine 0.6 - 1.1 mg/dL 1.0 - 1.13(H)  Sodium 136 - 145 mEq/L 140 - 137  Potassium 3.5 - 5.1 mEq/L 4.2 - 4.2  Chloride 101 - 111 mmol/L - - 106  CO2 22 - 29 mEq/L 25 - 24  Calcium 8.4 - 10.4 mg/dL 10.0 - 9.7  Total Protein 6.4 - 8.3 g/dL 8.1 6.7 -  Total Bilirubin 0.20 - 1.20 mg/dL 0.60 0.6 -  Alkaline Phos 40 - 150 U/L 48 34(L) -  AST 5 - 34 U/L 16 27 -  ALT 0 - 55 U/L 8 13(L) -   Component     Latest Ref Rng & Units 01/14/2017  LDH     125 - 245 U/L 193  HIV Screen 4th Generation wRfx  Non Reactive Non Reactive  Hep C Virus Ab     0.0 - 0.9 s/co ratio <0.1  hCG,Beta Subunit,Qual,Serum     Negative <6 mIU/mL Negative  Sed Rate     0 - 32 mm/hr 7     RADIOGRAPHIC STUDIES: I have personally reviewed the radiological images as listed and agreed with the findings in the report. Ct Abdomen Pelvis W Contrast  Result Date: 01/10/2017 CLINICAL DATA:  Paraspinal masses on previous CT chest, assessment for lymphoproliferative disease/ adenopathy in the abdomen EXAM: CT ABDOMEN AND PELVIS WITH CONTRAST TECHNIQUE: Multidetector CT imaging of the abdomen and pelvis was performed using the standard protocol following bolus administration of intravenous contrast. CONTRAST:  161mL ISOVUE-300 IOPAMIDOL (ISOVUE-300) INJECTION 61% COMPARISON:  01/06/2017 chest CT FINDINGS: Lower chest: Stable paraspinal and periaortic new masses are present, including a 1.5 cm in short axis paraesophageal lesion on image 7/3 which appears stable. Stable solid-appearing right breast lesion at 3.6 by 1.5 cm, image 7/3. Hepatobiliary: Unremarkable Pancreas: Unremarkable Spleen: The spleen measures 11.8 by 6.1 by 8.2 cm (volume = 310  cm^3), without focal lesion identified. 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 Mild scarring along the right kidney upper pole. Stomach/Bowel: Unremarkable Vascular/Lymphatic: Extensive right gastric, peripancreatic, retrocrural, porta hepatis, retroperitoneal, and central mesenteric adenopathy new. A peripancreatic node measures 4.0 cm in short axis on image 23/3. A left periaortic node measures 3.5 cm in short axis on image 32/3. A central mesenteric node measures 3.7 cm in short axis on image 44/3. Reproductive: An IUD appears satisfactorily positioned along the endometrium. 3.2 by 2.4 cm right adnexal cyst. Other: No supplemental non-categorized findings. Musculoskeletal: Unremarkable IMPRESSION: 1. Extensive upper abdominal adenopathy favoring lymphoma/lymphoproliferative disease, tissue diagnosis recommended. No splenomegaly or focal splenic lesion. 2. 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 No hydronephrosis. 3. 3.2 cm in long axis right adnexal cyst. In the absence of symptoms this usually would not require further workup in this age group. Electronically Signed   By: Van Clines M.D.   On: 01/10/2017 08:19   Nm Pet Image Initial (pi) Skull Base To Thigh  Result Date: 02/01/2017 CLINICAL DATA:  Initial treatment strategy for abdominal and thoracic lymphadenopathy on recent CT scans. EXAM: NUCLEAR MEDICINE PET SKULL BASE TO THIGH TECHNIQUE: 10.8 mCi F-18 FDG was injected intravenously. Full-ring PET imaging was performed from the skull base to thigh after the radiotracer. CT data was obtained and used for attenuation correction and anatomic localization. FASTING BLOOD GLUCOSE:  Value: 91 mg/dl COMPARISON:  CT scans dated 01/09/2017 FINDINGS: NECK No hypermetabolic lymph nodes in the neck. CHEST Right paratracheal, subcarinal, paraesophageal, and para aortic/pleural soft tissue density nodules are identified in the chest,  most of which demonstrate relatively low-grade in activity. A hypermetabolic periaortic lymph node on image 79/4 has a maximum SUV of 4.7 which is fairly representative of the other lymph nodes in the chest. A smooth oval-shaped right breast lesion measuring 4.0 by 1.8 cm has a maximum SUV of 3.4. The lungs appear clear. ABDOMEN/PELVIS Extensive bulky retroperitoneal, mesenteric, porta hepatis, retrocrural, and peripancreatic adenopathy observed. A mesenteric lymph node on image 130/4 measures 3.5 cm in short axis and has a maximum standard uptake value of 12.1. A lymph node above the pancreatic body measures 3.6 cm in short axis on image 102/4 and has a maximum SUV of 13.2. New No splenomegaly. No focal splenic or hepatic lesion. The extensive peripancreatic nodal disease makes it difficult to completely  exclude a pancreatic lesion although I am skeptical of a pancreatic parenchymal lesion. Non rotated right kidney. 5.4 by 4.7 cm right right for uterine adnexal cystic lesion appears photopenic. Fairly empty urinary bladder with flattened appearance causing several adjacent collections of urine containing FDG ; this simulates an anterior uterine lesion but there is no CT correlate on either today's exam or the 01/09/2017 exam to suggest that there is a true uterine lesion. SKELETON Low-grade diffuse mildly accentuated activity throughout the skeleton but without a focal skeletal lesion identified. IMPRESSION: 1. Hypermetabolic retroperitoneal, mesenteric, peripancreatic, porta hepatis, and retrocrural adenopathy. 2. Nodularity in the left paraspinal, subcarinal, and paraesophageal regions in the chest as lower but still abnormal metabolic activity, and probably represents adenopathy rather than extramedullary hematopoiesis. There is also a faintly metabolic lesion in the right breast which probably represents the same lesion biopsied in 2016, with benign results. 3. Photopenic cystic right adnexal lesion. An IUD is  present in the uterus. Electronically Signed   By: Van Clines M.D.   On: 02/01/2017 16:10   Ct Biopsy  Result Date: 01/21/2017 INDICATION: 36 year old female with a history of retroperitoneal adenopathy EXAM: CT BIOPSY MEDICATIONS: None. ANESTHESIA/SEDATION: Moderate (conscious) sedation was employed during this procedure. A total of Versed 1.5 mg and Fentanyl 75 mcg was administered intravenously. Moderate Sedation Time: 12 minutes. The patient's level of consciousness and vital signs were monitored continuously by radiology nursing throughout the procedure under my direct supervision. FLUOROSCOPY TIME:  CT COMPLICATIONS: None PROCEDURE: Informed written consent was obtained from the patient after a thorough discussion of the procedural risks, benefits and alternatives. All questions were addressed. Maximal Sterile Barrier Technique was utilized including caps, mask, sterile gowns, sterile gloves, sterile drape, hand hygiene and skin antiseptic. A timeout was performed prior to the initiation of the procedure. Patient position prone position on CT gantry table. Scout CT was acquired for planning purposes. The patient is an prepped and draped in the usual sterile fashion, the skin and subcutaneous tissues were generously infiltrated 1% lidocaine for local anesthesia. Using CT guidance, a 17 gauge guide needle was advanced into lymph node of the posterior aspect of the retroperitoneum. Multiple 18 gauge core biopsy were acquired and sent in both saline and formalin to the lab. Needle was removed and a bandage was placed. Patient tolerated the procedure well and remained hemodynamically stable throughout. No complications were encountered and no significant blood loss. IMPRESSION: Status post CT-guided biopsy of retroperitoneal adenopathy. Tissue specimen sent to pathology for complete histopathologic analysis. Signed, Dulcy Fanny. Earleen Newport, DO [facility]  Radiology Electronically Signed   By: Corrie Mckusick D.O.   On: 01/21/2017 10:58    ASSESSMENT & PLAN:   36 year old African-American female with  #1 Extensive intra-abdominal lymphadenopathy without any early primary source of infection/solid organ tumor. #2 Thoracic paraspinal masses likely lymphadenopathy  Overall presentation and limited pathology sample is strongly concerning for Hodgkin's lymphoma. However after discussion with the pathologist - he is unable to make a definitive diagnosis of Hodgkin's lymphoma based on limited tissue sampling.  LDH level and sedimentation rate within normal limits . HIV negative   hepatitis C negative .  Plan -PET/CT scan images and results were discussed in details with the patient and her mother. -We discussed the available pathology results and discuss the concern for likely Hodgkin's lymphoma though limited core needle sampling does not allow the pathologist to make a definitive diagnosis. -We discussed that since the pathology will determine  the definitive treatment and decision for chemotherapy and is a high impact decision we will like to have him or definitive pathology results to arrive at definitive diagnosis. -We discussed repeating percutaneous lymph node sampling under CT guidance versus surgery evaluation for excisional lymph node sampling. The patient prefers the latter to have a more definitive sample and avoid delay in diagnosis. -We will send in an urgent referral to [facility] [place] surgery for excisional lymph node sampling for definitive tissue diagnosis of the patient suspected Hodgkin's lymphoma. --Further management will be directed by her tissue pathology.  #3 Urinary urgency -will check UA./UCx  #3  Patient Active Problem List   Diagnosis Date Noted  . Adenopathy 01/10/2017  . 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 Abnormal chest CT 01/07/2017  . History of panic attacks   . Bacterial pharyngitis 07/30/2016   -continue f/u  with PCP for other chronic medical issues  -Urine tests (labs today) -Urgent surgery referral to [place] surgery- Dr Barry Dienes or other available for excision LN biopsy- likely hodgkins lymphoma (ASAP in a few days) -RTC with Dr [name] in 4 days post surgery biopsy within 2 weeks   All of the patients questions were answered with apparent satisfaction. The patient knows to call the clinic with any problems, questions or concerns.  I spent 25 minutes counseling the patient face to face. The total time spent in the appointment was 35 minutes and more than 50% was on counseling and direct patient cares.    Sullivan Lone MD [place] [facility] Hematology/Oncology Physician [facility]  (Office):       [phone] (Work cell):  [phone] (Fax):           [phone]

## 2017-02-05 NOTE — Telephone Encounter (Signed)
Per staff message, SW pt to inform her that urine sample negative for UTI.  Also confirmed with pt that she is scheduled for urgent consult at [facility].  Pt stated apt was scheduled for today at 3pm.

## 2017-02-08 ENCOUNTER — Encounter (HOSPITAL_COMMUNITY): Payer: Self-pay | Admitting: *Deleted

## 2017-02-10 NOTE — Anesthesia Preprocedure Evaluation (Addendum)
Anesthesia Evaluation  Patient identified by MRN, date of birth, ID band Patient awake    Reviewed: Allergy & Precautions, NPO status , Patient's Chart, lab work & pertinent test results  Airway Mallampati: II  TM Distance: >3 FB Neck ROM: Full    Dental no notable dental hx.    Pulmonary neg pulmonary ROS,    Pulmonary exam normal breath sounds clear to auscultation       Cardiovascular negative cardio ROS Normal cardiovascular exam Rhythm:Regular Rate:Normal  ECG: SR, rate 97   Neuro/Psych  Headaches, Anxiety    GI/Hepatic negative GI ROS, Neg liver ROS,   Endo/Other  Morbid obesity  Renal/GU negative Renal ROS     Musculoskeletal negative musculoskeletal ROS (+)   Abdominal (+) + obese,   Peds negative pediatric ROS (+)  Hematology negative hematology ROS (+)   Anesthesia Other Findings   Reproductive/Obstetrics                            Anesthesia Physical Anesthesia Plan  ASA: II  Anesthesia Plan: General   Post-op Pain Management:    Induction: Intravenous  PONV Risk Score and Plan: 4 or greater and Ondansetron, Dexamethasone and Midazolam  Airway Management Planned: Oral ETT  Additional Equipment:   Intra-op Plan:   Post-operative Plan: Extubation in OR  Informed Consent: I have reviewed the patients History and Physical, chart, labs and discussed the procedure including the risks, benefits and alternatives for the proposed anesthesia with the patient or authorized representative who has indicated his/her understanding and acceptance.   Dental advisory given  Plan Discussed with: CRNA  Anesthesia Plan Comments:        Anesthesia Quick Evaluation

## 2017-02-11 ENCOUNTER — Inpatient Hospital Stay (HOSPITAL_COMMUNITY): Payer: 59 | Admitting: Anesthesiology

## 2017-02-11 ENCOUNTER — Observation Stay (HOSPITAL_COMMUNITY)
Admission: RE | Admit: 2017-02-11 | Discharge: 2017-02-13 | Disposition: A | Discharge status: Home / Self Care | Payer: 59 | Source: Ambulatory Visit | Attending: General Surgery | Admitting: General Surgery

## 2017-02-11 ENCOUNTER — Encounter (HOSPITAL_COMMUNITY): Payer: Self-pay | Admitting: General Practice

## 2017-02-11 ENCOUNTER — Encounter (HOSPITAL_COMMUNITY)
Admission: RE | Disposition: A | Discharge status: Home / Self Care | Payer: Self-pay | Source: Ambulatory Visit | Attending: General Surgery

## 2017-02-11 DIAGNOSIS — K429 Umbilical hernia without obstruction or gangrene: Secondary | ICD-10-CM | POA: Insufficient documentation

## 2017-02-11 DIAGNOSIS — Z6841 Body Mass Index (BMI) 40.0 and over, adult: Secondary | ICD-10-CM | POA: Insufficient documentation

## 2017-02-11 DIAGNOSIS — C8193 Hodgkin lymphoma, unspecified, intra-abdominal lymph nodes: Principal | ICD-10-CM | POA: Insufficient documentation

## 2017-02-11 DIAGNOSIS — F410 Panic disorder [episodic paroxysmal anxiety] without agoraphobia: Secondary | ICD-10-CM | POA: Diagnosis not present

## 2017-02-11 DIAGNOSIS — Z803 Family history of malignant neoplasm of breast: Secondary | ICD-10-CM | POA: Insufficient documentation

## 2017-02-11 DIAGNOSIS — Z79899 Other long term (current) drug therapy: Secondary | ICD-10-CM | POA: Diagnosis not present

## 2017-02-11 DIAGNOSIS — R590 Localized enlarged lymph nodes: Secondary | ICD-10-CM | POA: Diagnosis present

## 2017-02-11 HISTORY — DX: Localized enlarged lymph nodes: R59.0

## 2017-02-11 HISTORY — PX: UMBILICAL HERNIA REPAIR: SHX196

## 2017-02-11 HISTORY — PX: LYMPH NODE BIOPSY: SHX201

## 2017-02-11 HISTORY — DX: Family history of other specified conditions: Z84.89

## 2017-02-11 LAB — CBC
HCT: 46.3 % — ABNORMAL HIGH (ref 36.0–46.0)
HEMATOCRIT: 43.2 % (ref 36.0–46.0)
HEMOGLOBIN: 14.7 g/dL (ref 12.0–15.0)
Hemoglobin: 14.4 g/dL (ref 12.0–15.0)
MCH: 26 pg (ref 26.0–34.0)
MCH: 25.3 pg — AB (ref 26.0–34.0)
MCHC: 33.3 g/dL (ref 30.0–36.0)
MCHC: 31.7 g/dL (ref 30.0–36.0)
MCV: 78.1 fL (ref 78.0–100.0)
MCV: 79.6 fL (ref 78.0–100.0)
PLATELETS: 204 10*3/uL (ref 150–400)
PLATELETS: 218 10*3/uL (ref 150–400)
RBC: 5.53 MIL/uL — ABNORMAL HIGH (ref 3.87–5.11)
RBC: 5.82 MIL/uL — ABNORMAL HIGH (ref 3.87–5.11)
RDW: 13.5 % (ref 11.5–15.5)
RDW: 13.9 % (ref 11.5–15.5)
WBC: 7.6 10*3/uL (ref 4.0–10.5)
WBC: 10.7 10*3/uL — ABNORMAL HIGH (ref 4.0–10.5)

## 2017-02-11 LAB — CREATININE, SERUM
CREATININE: 1.09 mg/dL — AB (ref 0.44–1.00)
GFR calc Af Amer: >60 mL/min (ref >60)

## 2017-02-11 LAB — HCG, SERUM, QUALITATIVE: Preg, Serum: NEGATIVE

## 2017-02-11 SURGERY — LYMPH NODE BIOPSY
Anesthesia: General | Site: Abdomen

## 2017-02-11 MED ORDER — CHLORHEXIDINE GLUCONATE CLOTH 2 % EX PADS: 6.0000 | MEDICATED_PAD | Freq: Once | CUTANEOUS | Status: DC

## 2017-02-11 MED ORDER — MORPHINE SULFATE (PF) 4 MG/ML IV SOLN: 1.0000 mg | INTRAVENOUS | Status: DC | PRN

## 2017-02-11 MED ORDER — HYDROMORPHONE HCL 1 MG/ML IJ SOLN
0.2500 mg | INTRAMUSCULAR | Status: DC | PRN
  Administered 2017-02-11 (×4): 0.5 mg via INTRAVENOUS

## 2017-02-11 MED ORDER — SODIUM CHLORIDE 0.9 % IJ SOLN
INTRAMUSCULAR | Status: AC
  Filled 2017-02-11: qty 10

## 2017-02-11 MED ORDER — OXYCODONE HCL 5 MG/5ML PO SOLN: 5.0000 mg | Freq: Once | ORAL | Status: AC | PRN

## 2017-02-11 MED ORDER — PROPOFOL 10 MG/ML IV BOLUS
INTRAVENOUS | Status: DC | PRN
Start: 2017-02-11 — End: 2017-02-11
  Administered 2017-02-11: 200 mg via INTRAVENOUS
  Administered 2017-02-11: 30 mg via INTRAVENOUS

## 2017-02-11 MED ORDER — DIPHENHYDRAMINE HCL 50 MG/ML IJ SOLN: 12.5000 mg | Freq: Four times a day (QID) | INTRAMUSCULAR | Status: DC | PRN

## 2017-02-11 MED ORDER — PHENYLEPHRINE HCL 10 MG/ML IJ SOLN
INTRAMUSCULAR | Status: DC | PRN
  Administered 2017-02-11 (×2): 80 ug via INTRAVENOUS
  Administered 2017-02-11: 120 ug via INTRAVENOUS

## 2017-02-11 MED ORDER — PROPOFOL 10 MG/ML IV BOLUS
INTRAVENOUS | Status: AC
  Filled 2017-02-11: qty 20

## 2017-02-11 MED ORDER — SIMETHICONE 80 MG PO CHEW: 40.0000 mg | CHEWABLE_TABLET | Freq: Four times a day (QID) | ORAL | Status: DC | PRN

## 2017-02-11 MED ORDER — METHOCARBAMOL 500 MG PO TABS
500.0000 mg | ORAL_TABLET | Freq: Four times a day (QID) | ORAL | Status: DC | PRN
  Administered 2017-02-11 – 2017-02-12 (×2): 500 mg via ORAL
  Filled 2017-02-11 (×3): qty 1

## 2017-02-11 MED ORDER — PROMETHAZINE HCL 25 MG/ML IJ SOLN: 6.2500 mg | INTRAMUSCULAR | Status: DC | PRN

## 2017-02-11 MED ORDER — CEFAZOLIN SODIUM-DEXTROSE 2-4 GM/100ML-% IV SOLN
2.0000 g | Freq: Three times a day (TID) | INTRAVENOUS | Status: AC
  Administered 2017-02-11: 2 g via INTRAVENOUS
  Filled 2017-02-11: qty 100

## 2017-02-11 MED ORDER — GLYCOPYRROLATE 0.2 MG/ML IJ SOLN
INTRAMUSCULAR | Status: DC | PRN
  Administered 2017-02-11: .8 mg via INTRAVENOUS

## 2017-02-11 MED ORDER — DEXAMETHASONE SODIUM PHOSPHATE 10 MG/ML IJ SOLN
INTRAMUSCULAR | Status: DC | PRN
  Administered 2017-02-11: 8 mg via INTRAVENOUS

## 2017-02-11 MED ORDER — OXYCODONE HCL 5 MG PO TABS
5.0000 mg | ORAL_TABLET | Freq: Once | ORAL | Status: AC | PRN
  Administered 2017-02-11: 5 mg via ORAL

## 2017-02-11 MED ORDER — KETOTIFEN FUMARATE 0.025 % OP SOLN
1.0000 [drp] | Freq: Two times a day (BID) | OPHTHALMIC | Status: DC
  Administered 2017-02-11 – 2017-02-12 (×2): 1 [drp] via OPHTHALMIC
  Filled 2017-02-11: qty 5

## 2017-02-11 MED ORDER — MIDAZOLAM HCL 2 MG/2ML IJ SOLN
INTRAMUSCULAR | Status: AC
  Filled 2017-02-11: qty 2

## 2017-02-11 MED ORDER — TRAMADOL HCL 50 MG PO TABS: 50.0000 mg | ORAL_TABLET | Freq: Four times a day (QID) | ORAL | Status: DC | PRN

## 2017-02-11 MED ORDER — KCL IN DEXTROSE-NACL 20-5-0.45 MEQ/L-%-% IV SOLN
INTRAVENOUS | Status: AC
  Administered 2017-02-11: 16:00:00 via INTRAVENOUS
  Filled 2017-02-11: qty 1000

## 2017-02-11 MED ORDER — NEOSTIGMINE METHYLSULFATE 10 MG/10ML IV SOLN
INTRAVENOUS | Status: DC | PRN
  Administered 2017-02-11: 5 mg via INTRAVENOUS

## 2017-02-11 MED ORDER — LIDOCAINE HCL (PF) 1 % IJ SOLN
INTRAMUSCULAR | Status: AC
  Filled 2017-02-11: qty 30

## 2017-02-11 MED ORDER — ONDANSETRON HCL 4 MG/2ML IJ SOLN
INTRAMUSCULAR | Status: DC | PRN
  Administered 2017-02-11: 4 mg via INTRAVENOUS

## 2017-02-11 MED ORDER — MIDAZOLAM HCL 5 MG/5ML IJ SOLN
INTRAMUSCULAR | Status: DC | PRN
  Administered 2017-02-11: 2 mg via INTRAVENOUS

## 2017-02-11 MED ORDER — ROCURONIUM BROMIDE 100 MG/10ML IV SOLN
INTRAVENOUS | Status: DC | PRN
  Administered 2017-02-11: 40 mg via INTRAVENOUS
  Administered 2017-02-11: 10 mg via INTRAVENOUS

## 2017-02-11 MED ORDER — FENTANYL CITRATE (PF) 250 MCG/5ML IJ SOLN
INTRAMUSCULAR | Status: AC
  Filled 2017-02-11: qty 5

## 2017-02-11 MED ORDER — DIPHENHYDRAMINE HCL 12.5 MG/5ML PO ELIX: 12.5000 mg | ORAL_SOLUTION | Freq: Four times a day (QID) | ORAL | Status: DC | PRN

## 2017-02-11 MED ORDER — SODIUM CHLORIDE 0.9 % IR SOLN
Status: DC | PRN
  Administered 2017-02-11: 1000 mL

## 2017-02-11 MED ORDER — LIDOCAINE HCL 1 % IJ SOLN
INTRAMUSCULAR | Status: DC | PRN
  Administered 2017-02-11: 28 mL

## 2017-02-11 MED ORDER — OXYCODONE HCL 5 MG PO TABS
5.0000 mg | ORAL_TABLET | ORAL | Status: DC | PRN
  Administered 2017-02-11 – 2017-02-13 (×8): 10 mg via ORAL
  Filled 2017-02-11 (×9): qty 2

## 2017-02-11 MED ORDER — GABAPENTIN 300 MG PO CAPS
300.0000 mg | ORAL_CAPSULE | ORAL | Status: AC
  Administered 2017-02-11: 300 mg via ORAL
  Filled 2017-02-11: qty 1

## 2017-02-11 MED ORDER — FENTANYL CITRATE (PF) 100 MCG/2ML IJ SOLN
INTRAMUSCULAR | Status: DC | PRN
  Administered 2017-02-11: 50 ug via INTRAVENOUS
  Administered 2017-02-11 (×2): 100 ug via INTRAVENOUS

## 2017-02-11 MED ORDER — FAMOTIDINE IN NACL 20-0.9 MG/50ML-% IV SOLN
20.0000 mg | Freq: Once | INTRAVENOUS | Status: AC
  Administered 2017-02-11: 20 mg via INTRAVENOUS
  Filled 2017-02-11: qty 50

## 2017-02-11 MED ORDER — POLYETHYLENE GLYCOL 3350 17 G PO PACK
17.0000 g | PACK | Freq: Every day | ORAL | Status: DC | PRN
  Administered 2017-02-12: 17 g via ORAL
  Filled 2017-02-11: qty 1

## 2017-02-11 MED ORDER — LIDOCAINE HCL (CARDIAC) 20 MG/ML IV SOLN
INTRAVENOUS | Status: DC | PRN
  Administered 2017-02-11: 80 mg via INTRAVENOUS

## 2017-02-11 MED ORDER — ONDANSETRON HCL 4 MG/2ML IJ SOLN
4.0000 mg | Freq: Four times a day (QID) | INTRAMUSCULAR | Status: DC | PRN
Start: 2017-02-11 — End: 2017-02-13
  Administered 2017-02-11: 4 mg via INTRAVENOUS
  Filled 2017-02-11: qty 2

## 2017-02-11 MED ORDER — HYDROMORPHONE HCL 1 MG/ML IJ SOLN
INTRAMUSCULAR | Status: AC
  Filled 2017-02-11: qty 1

## 2017-02-11 MED ORDER — 0.9 % SODIUM CHLORIDE (POUR BTL) OPTIME
TOPICAL | Status: DC | PRN
  Administered 2017-02-11: 1000 mL

## 2017-02-11 MED ORDER — OXYCODONE HCL 5 MG PO TABS
ORAL_TABLET | ORAL | Status: AC
  Filled 2017-02-11: qty 1

## 2017-02-11 MED ORDER — BUPIVACAINE-EPINEPHRINE 0.25% -1:200000 IJ SOLN
INTRAMUSCULAR | Status: AC
  Filled 2017-02-11: qty 1

## 2017-02-11 MED ORDER — SENNA 8.6 MG PO TABS
1.0000 | ORAL_TABLET | Freq: Two times a day (BID) | ORAL | Status: DC
  Administered 2017-02-11 – 2017-02-12 (×4): 8.6 mg via ORAL
  Filled 2017-02-11 (×4): qty 1

## 2017-02-11 MED ORDER — ONDANSETRON 4 MG PO TBDP
4.0000 mg | ORAL_TABLET | Freq: Four times a day (QID) | ORAL | Status: DC | PRN
  Administered 2017-02-13: 4 mg via ORAL
  Filled 2017-02-11: qty 1

## 2017-02-11 MED ORDER — ACETAMINOPHEN 500 MG PO TABS
1000.0000 mg | ORAL_TABLET | Freq: Four times a day (QID) | ORAL | Status: DC
  Administered 2017-02-11 – 2017-02-13 (×7): 1000 mg via ORAL
  Filled 2017-02-11 (×8): qty 2

## 2017-02-11 MED ORDER — CEFAZOLIN SODIUM-DEXTROSE 2-4 GM/100ML-% IV SOLN
2.0000 g | INTRAVENOUS | Status: AC
  Administered 2017-02-11: 2 g via INTRAVENOUS
  Filled 2017-02-11: qty 100

## 2017-02-11 MED ORDER — SUGAMMADEX SODIUM 200 MG/2ML IV SOLN
INTRAVENOUS | Status: AC
  Filled 2017-02-11: qty 2

## 2017-02-11 MED ORDER — ACETAMINOPHEN 500 MG PO TABS
1000.0000 mg | ORAL_TABLET | ORAL | Status: AC
  Administered 2017-02-11: 1000 mg via ORAL
  Filled 2017-02-11: qty 2

## 2017-02-11 MED ORDER — LACTATED RINGERS IV SOLN
INTRAVENOUS | Status: DC | PRN
  Administered 2017-02-11: 07:00:00 via INTRAVENOUS

## 2017-02-11 MED ORDER — ENOXAPARIN SODIUM 40 MG/0.4ML ~~LOC~~ SOLN
40.0000 mg | SUBCUTANEOUS | Status: DC
  Administered 2017-02-13: 40 mg via SUBCUTANEOUS
  Filled 2017-02-11 (×2): qty 0.4

## 2017-02-11 SURGICAL SUPPLY — 31 items
BLADE SURG 10 STRL SS (BLADE) ×2 IMPLANT
CANISTER SUCT 3000ML PPV (MISCELLANEOUS) ×2 IMPLANT
CONT SPEC 4OZ CLIKSEAL STRL BL (MISCELLANEOUS) ×2 IMPLANT
COVER SURGICAL LIGHT HANDLE (MISCELLANEOUS) ×2 IMPLANT
DERMABOND ADVANCED (GAUZE/BANDAGES/DRESSINGS) ×1
DERMABOND ADVANCED .7 DNX12 (GAUZE/BANDAGES/DRESSINGS) ×1 IMPLANT
ELECT REM PT RETURN 9FT ADLT (ELECTROSURGICAL) ×2
ELECTRODE REM PT RTRN 9FT ADLT (ELECTROSURGICAL) ×1 IMPLANT
GLOVE BIO SURGEON STRL SZ 6 (GLOVE) ×2 IMPLANT
GLOVE BIOGEL PI IND STRL 6.5 (GLOVE) ×1 IMPLANT
GLOVE BIOGEL PI INDICATOR 6.5 (GLOVE) ×1
GOWN STRL REUS W/ TWL LRG LVL3 (GOWN DISPOSABLE) ×3 IMPLANT
GOWN STRL REUS W/TWL 2XL LVL3 (GOWN DISPOSABLE) ×2 IMPLANT
GOWN STRL REUS W/TWL LRG LVL3 (GOWN DISPOSABLE) ×3
KIT BASIN OR (CUSTOM PROCEDURE TRAY) ×2 IMPLANT
KIT ROOM TURNOVER OR (KITS) ×2 IMPLANT
NS IRRIG 1000ML POUR BTL (IV SOLUTION) ×2 IMPLANT
PAD ARMBOARD 7.5X6 YLW CONV (MISCELLANEOUS) ×4 IMPLANT
PENCIL BUTTON HOLSTER BLD 10FT (ELECTRODE) ×2 IMPLANT
SCISSORS LAP 5X35 DISP (ENDOMECHANICALS) ×2 IMPLANT
SET IRRIG TUBING LAPAROSCOPIC (IRRIGATION / IRRIGATOR) ×2 IMPLANT
SHEARS HARMONIC ACE PLUS 36CM (ENDOMECHANICALS) ×2 IMPLANT
SLEEVE ENDOPATH XCEL 5M (ENDOMECHANICALS) ×4 IMPLANT
SUT MNCRL AB 4-0 PS2 18 (SUTURE) ×4 IMPLANT
SUT PDS AB 1 CT  36 (SUTURE) ×2
SUT PDS AB 1 CT 36 (SUTURE) ×2 IMPLANT
SUT VIC AB 3-0 SH 8-18 (SUTURE) ×2 IMPLANT
TOWEL OR 17X24 6PK STRL BLUE (TOWEL DISPOSABLE) ×2 IMPLANT
TOWEL OR 17X26 10 PK STRL BLUE (TOWEL DISPOSABLE) ×2 IMPLANT
TRAY LAPAROSCOPIC MC (CUSTOM PROCEDURE TRAY) ×2 IMPLANT
TROCAR XCEL NON-BLD 5MMX100MML (ENDOMECHANICALS) ×2 IMPLANT

## 2017-02-11 NOTE — H&P (Signed)
Bonnie Larsen 02/05/2017 3:28 PM Location: [facility] Office Patient #: [id number] DOB: 02/24/1981 Married / Language: English / Race: Black or African American Female   History of Present Illness (Karol Skarzynski MD; 02/05/2017 5:13 PM) The patient is a 36 year old female who presents with lymphadenopathy. Patient is a 36-year-old female referred for consultation by Dr. Kale for a new diagnosis of lymphadenopathy. She presented to the emergency department in mid July with shortness of breath, chest pain, posterior neck pain and bilateral jaw pain. Imaging showed thoracic lymphadenopathy. Because the lymphadenopathy she also had a CT of the abdomen and pelvis. this showed significant upper abdominal lymphadenopathy. Because of that she was referred to oncology and underwent a CT-guided biopsy of the retroperitoneal lymphadenopathy. This was unrevealing. She has subsequently had a PET scan which showed similar findings. The lymphadenopathy was hypermetabolic. Notably, there is nothing in the cervical region axillary region or inguinal region.  The patient denies family history of lymphoma, but her mother had breast cancer. She denies night sweats or weight loss. She is not having pain. Since that time showing the emergency department she has been feeling reasonably well. She feels a little bit of fullness in her upper abdomen but has not noted any early satiety, nausea or vomiting.   PET 02/01/17 IMPRESSION: 1. Hypermetabolic retroperitoneal, mesenteric, peripancreatic, porta hepatis, and retrocrural adenopathy. 2. Nodularity in the left paraspinal, subcarinal, and paraesophageal regions in the chest as lower but still abnormal metabolic activity, and probably represents adenopathy rather than extramedullary hematopoiesis. There is also a faintly metabolic lesion in the right breast which probably represents the same lesion biopsied in 2016, with benign results. 3. Photopenic cystic right  adnexal lesion. An IUD is present in the uterus.   CT abd/pelvis 01/10/17 IMPRESSION: 1. Extensive upper abdominal adenopathy favoring lymphoma/lymphoproliferative disease, tissue diagnosis recommended. No splenomegaly or focal splenic lesion. 2. 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 No hydronephrosis. 3. 3.2 cm in long axis right adnexal cyst. In the absence of symptoms this usually would not require further workup in this age group.  CT angio chest 01/06/2017 IMPRESSION: 1. No acute pulmonary embolism. 2. Multiple prevertebral paraspinal masses, differential diagnosis includes extramedullary hematopoiesis and metastasis/lymphadenopathy. Recommend correlation for laboratory analysis for anemia and lymphoproliferative disease. Critical Value/emergent results were called by telephone at the time of interpretation on 01/06/2017 at 3:39 am to Dr. DAVID GLICK , who verbally acknowledged these results.    Problem List/Past Medical ([name], CMA; 02/05/2017 3:32 PM) Anxiety  Migraines   Past Surgical History ([name], CMA; 02/05/2017 3:33 PM) No Surgeries   Allergies ([name], CMA; 02/05/2017 3:40 PM) Clindamycin HCl (Bulk) *CHEMICALS*   Medication History ([name], CMA; 02/05/2017 3:40 PM) TraMADol HCl (50MG Tablet, Oral) Active. Medications Reconciled  Social History ([name], CMA; 02/05/2017 3:31 PM) No drug use  Caffeine use  Tea, Carbonated beverages. No alcohol use   Family History ([name], CMA; 02/05/2017 3:31 PM) Alcohol Abuse  Father. Anesthetic complications  Mother. Arthritis  Mother. Breast Cancer  Mother. Heart Disease  Father. Diabetes Mellitus  High Blood Pressure / Hypertension  Other Cancer   Pregnancy / Birth History ([name], CMA; 02/05/2017 3:33 PM) Age at menarche  9 years. Irregular periods  Pregnancies ([facility])   4. Deliveries ([facility])  2. Mammogram  1-3 years ago. Pap smear  1-5 years ago.    Review of Systems (Latia Mataya MD; 02/05/2017 5:13 PM)   Cardiovascular Present- Leg Cramps. Gastrointestinal Present- Abdominal Pain, Bloating, Excessive gas and Gets full quickly at meals. Female Genitourinary Present- Urgency. Musculoskeletal Present- Muscle Pain. Psychiatric Present- Anxiety and Fearful. All other systems negative  Vitals ([name] CMA; 02/05/2017 4:03 PM) 02/05/2017 3:39 PM Weight: 227.25 lb Height: 63.5in Body Surface Area: 2.05 m Body Mass Index: 39.62 kg/m  Temp.: 97.4F(Oral)  Pulse: 80 (Regular)  BP: 124/76 (Sitting, Left Arm, Standard)       Physical Exam (Athel Merriweather MD; 02/05/2017 5:13 PM) General Mental Status-Alert. General Appearance-Consistent with stated age. Hydration-Well hydrated. Voice-Normal.  Head and Neck Head-normocephalic, atraumatic with no lesions or palpable masses. Trachea-midline. Thyroid Gland Characteristics - normal size and consistency.  Eye Eyeball - Bilateral-Extraocular movements intact. Sclera/Conjunctiva - Bilateral-No scleral icterus.  Chest and Lung Exam Chest and lung exam reveals -quiet, even and easy respiratory effort with no use of accessory muscles and on auscultation, normal breath sounds, no adventitious sounds and normal vocal resonance. Inspection Chest Wall - Normal. Back - normal.  Cardiovascular Cardiovascular examination reveals -normal heart sounds, regular rate and rhythm with no murmurs and normal pedal pulses bilaterally.  Abdomen Inspection Inspection of the abdomen reveals - No Hernias. Palpation/Percussion Palpation and Percussion of the abdomen reveal - Soft, Non Tender, No Rebound tenderness, No Rigidity (guarding) and No hepatosplenomegaly. Auscultation Auscultation of the abdomen reveals - Bowel sounds normal.  Neurologic Neurologic evaluation  reveals -alert and oriented x 3 with no impairment of recent or remote memory. Mental Status-Normal.  Musculoskeletal Global Assessment -Note: no gross deformities.  Normal Exam - Left-Upper Extremity Strength Normal and Lower Extremity Strength Normal. Normal Exam - Right-Upper Extremity Strength Normal and Lower Extremity Strength Normal.  Lymphatic Head & Neck  General Head & Neck Lymphatics: Bilateral - Description - Normal. Axillary  General Axillary Region: Bilateral - Description - Normal. Tenderness - Non Tender. Femoral & Inguinal  Generalized Femoral & Inguinal Lymphatics: Bilateral - Description - No Generalized lymphadenopathy.    Assessment & Plan (Maleni Seyer MD; 02/05/2017 5:25 PM) LYMPHADENOPATHY, ABDOMINAL (R59.0) Impression: The patient has already had an unrevealing core needle biopsy. unfortunately, I do think that she would benefit from a abdominal lymph node biopsy. This is difficult given that these lymph nodes are in the mesentery or the retroperitoneum. I advised the patient that I would almost certainly need at least a hand-assisted approach to safely get a good lymph node from the mesenteric location.  I will do this in an urgent fashion. I discussed the surgery with the patient. I reviewed the risks of bleeding, infection, damage to adjacent structures, possible prolonged ileus, blood clots, heart or lung complications, wound issues, and death. The patient wishes to proceed. We will do this at the first available opportunity.  She may have a non-neoplastic cause of lymphadenopathy such as sarcoid or infection. A larger piece of tissue should be helpful in differentiating. Current Plans You are being scheduled for surgery- Our schedulers will call you.  You should hear from our office's scheduling department within 5 working days about the location, date, and time of surgery. We try to make accommodations for patient's preferences in scheduling  surgery, but sometimes the OR schedule or the surgeon's schedule prevents us from making those accommodations.  If you have not heard from our office ([phone]) in 5 working days, call the office and ask for your surgeon's nurse.  If you have other questions about your diagnosis, plan, or surgery, call the office and ask for your   surgeon's nurse.  Pt Education - [facility] Free Text Education/Instructions: discussed with patient and provided information.   Signed by Jannis Atkins, MD (02/05/2017 5:26 PM)  

## 2017-02-11 NOTE — Progress Notes (Signed)
Patient arrived to 6N04 from PACU, alert and oriented, IV fluids infusing, mild pain to mid abdomen, 3 lap sites with skin glue noted to mid abdomen. Patient was able to void upon arrival, oriented to room and staff, will continue to monitor. PACU nurse told patient she would call her family.

## 2017-02-11 NOTE — Anesthesia Postprocedure Evaluation (Signed)
Anesthesia Post Note  Patient: Bonnie Larsen  Procedure(s) Performed: Procedure(s) (LRB): HAND ASSISTED LAPAROSCOPIC LYMPH NODE BIOPSY (N/A)     Patient location during evaluation: PACU Anesthesia Type: General Level of consciousness: awake and alert Pain management: pain level controlled Vital Signs Assessment: post-procedure vital signs reviewed and stable Respiratory status: spontaneous breathing, nonlabored ventilation, respiratory function stable and patient connected to nasal cannula oxygen Cardiovascular status: blood pressure returned to baseline and stable Postop Assessment: no signs of nausea or vomiting Anesthetic complications: no    Last Vitals:  Vitals:   02/11/17 1215 02/11/17 1230  BP:    Pulse: 94 96  Resp: 16 15  Temp:    SpO2: 99% 96%    Last Pain:  Vitals:   02/11/17 1234  PainSc: 6                  Bannie Lobban P Elora Wolter

## 2017-02-11 NOTE — Interval H&P Note (Signed)
History and Physical Interval Note:  02/11/2017 7:35 AM  Bonnie Larsen  has presented today for surgery, with the diagnosis of ABDOMINAL LYMPHADENOPATHY  The various methods of treatment have been discussed with the patient and family. After consideration of risks, benefits and other options for treatment, the patient has consented to  Procedure(s): HAND ASSISTED LAPAROSCOPIC LYMPH NODE BIOPSY (N/A) as a surgical intervention .  The patient's history has been reviewed, patient examined, no change in status, stable for surgery.  I have reviewed the patient's chart and labs.  Questions were answered to the patient's satisfaction.     [name]

## 2017-02-11 NOTE — Op Note (Signed)
PRE-OPERATIVE DIAGNOSIS: Mesenteric lymphadenopathy  POST-OPERATIVE DIAGNOSIS:  Same  PROCEDURE:  Procedure(s): Hand assisted laparoscopic mesenteric lymph node biopsy and umbilical hernia repair  SURGEON:  Surgeon(s): Stark Klein, MD  ASSISTANT:   Judyann Munson, MD  ANESTHESIA:   local and general  DRAINS: none   LOCAL MEDICATIONS USED:  BUPIVICAINE  and LIDOCAINE   SPECIMEN:  Source of Specimen:  mesenteric lymph nodes  DISPOSITION OF SPECIMEN:  pathology and microbiology  COUNTS:  YES  DICTATION: .Dragon Dictation  PLAN OF CARE: Admit for overnight observation  PATIENT DISPOSITION:  PACU - hemodynamically stable.  FINDINGS:  Large rubbery lymph nodes in the mesetery  EBL: min  PROCEDURE:  Pt was identified in the holding area and taken to the OR where she was placed supine on the OR table.  General anesthesia was induced. The patient's arms were tucked, and her abdomen was prepped and draped in sterile fashion. A timeout was performed according to the surgical safety checklist. When all was correct, we continued.  The patient was placed into reverse trendelenburg position and rotated to the right. A 5 mm Optiview port was placed under direct visualization at the left costal margin after administration of local. Pneumoperitoneum was achieved. Two additional 5 mm  ports were placed in the infraumbilical midline and the LLQ.  The omentum was pulled up and immediately visible was a large area of lymphadenopathy. It was not clear whether these areas were bowel wall or lymph nodes. A small hand port was placed incorporating an umbilical hernia and the infraumbilical 5 mm port.  A 5 mm port was placed in the RUQ to assist with retraction of the omentum.   The two largest, most superficial lymph nodes were elevated manually and the harmonic scalpel was used to dissect the lymphovascular channels. There was no full thickness mesenteric defect left behind.  There was no evidence  of hemorrhage.  A four quadrant inspection was performed and no gross abnormalities were seen.  No evidence of bowel injury was seen.    The pneumoperitoneum was allowed to be released and the trocars were removed. The hand port was removed as well. The omentum was pulled back down over the bowel. The fascia was then closed using running #1 PDS suture. The skin was irrigated and closed with 3-0 interrupted Vicryls and 4-0 running subcuticular Monocryl. The port sites were closed with 4-0 Monocryl.  The incisions were dressed with Dermabond.    The patient was allowed to emerge from anesthesia.  She was taken to the PACU in stable condition. Needle, sponge, and instrument counts were correct 2.

## 2017-02-11 NOTE — Transfer of Care (Signed)
Immediate Anesthesia Transfer of Care Note  Patient: Bonnie Larsen  Procedure(s) Performed: Procedure(s): HAND ASSISTED LAPAROSCOPIC LYMPH NODE BIOPSY (N/A)  Patient Location: PACU  Anesthesia Type:General  Level of Consciousness: awake, alert , oriented and patient cooperative  Airway & Oxygen Therapy: Patient Spontanous Breathing and Patient connected to nasal cannula oxygen  Post-op Assessment: Report given to RN and Post -op Vital signs reviewed and stable  Post vital signs: Reviewed and stable  Last Vitals:  Vitals:   02/11/17 0620  BP: 119/70  Pulse: 95  Resp: 18  Temp: 36.6 C  SpO2: 99%    Last Pain: There were no vitals filed for this visit.       Complications: No apparent anesthesia complications

## 2017-02-11 NOTE — Anesthesia Procedure Notes (Signed)
Procedure Name: Intubation Date/Time: 02/11/2017 8:09 AM Performed by: Shirlyn Goltz Pre-anesthesia Checklist: Patient identified, Emergency Drugs available, Suction available and Patient being monitored Patient Re-evaluated:Patient Re-evaluated prior to induction Oxygen Delivery Method: Circle system utilized Preoxygenation: Pre-oxygenation with 100% oxygen Induction Type: IV induction Ventilation: Mask ventilation without difficulty Laryngoscope Size: Mac and 3 Grade View: Grade II Tube type: Oral Tube size: 7.0 mm Number of attempts: 1 Airway Equipment and Method: Stylet Placement Confirmation: ETT inserted through vocal cords under direct vision,  positive ETCO2 and breath sounds checked- equal and bilateral Secured at: 21 cm Tube secured with: Tape Dental Injury: Teeth and Oropharynx as per pre-operative assessment

## 2017-02-12 ENCOUNTER — Encounter (HOSPITAL_COMMUNITY): Payer: Self-pay | Admitting: General Surgery

## 2017-02-12 DIAGNOSIS — C8193 Hodgkin lymphoma, unspecified, intra-abdominal lymph nodes: Secondary | ICD-10-CM | POA: Diagnosis not present

## 2017-02-12 LAB — BASIC METABOLIC PANEL
ANION GAP: 8 (ref 5–15)
BUN: 7 mg/dL (ref 6–20)
CO2: 24 mmol/L (ref 22–32)
Calcium: 9 mg/dL (ref 8.9–10.3)
Chloride: 104 mmol/L (ref 101–111)
Creatinine, Ser: 1.09 mg/dL — ABNORMAL HIGH (ref 0.44–1.00)
GLUCOSE: 161 mg/dL — AB (ref 65–99)
POTASSIUM: 4.2 mmol/L (ref 3.5–5.1)
Sodium: 136 mmol/L (ref 135–145)

## 2017-02-12 LAB — CBC
HEMATOCRIT: 42.1 % (ref 36.0–46.0)
Hemoglobin: 13.6 g/dL (ref 12.0–15.0)
MCH: 25.4 pg — ABNORMAL LOW (ref 26.0–34.0)
MCHC: 32.3 g/dL (ref 30.0–36.0)
MCV: 78.5 fL (ref 78.0–100.0)
PLATELETS: 228 10*3/uL (ref 150–400)
RBC: 5.36 MIL/uL — AB (ref 3.87–5.11)
RDW: 13.6 % (ref 11.5–15.5)
WBC: 13.9 10*3/uL — AB (ref 4.0–10.5)

## 2017-02-12 LAB — ACID FAST SMEAR (AFB, MYCOBACTERIA): Acid Fast Smear: NEGATIVE

## 2017-02-12 MED ORDER — OXYCODONE HCL 5 MG PO TABS: 5.0000 mg | ORAL_TABLET | ORAL | 0 refills | Status: DC | PRN

## 2017-02-12 MED ORDER — SENNA 8.6 MG PO TABS: 1.0000 | ORAL_TABLET | Freq: Two times a day (BID) | ORAL | 1 refills | Status: DC

## 2017-02-12 NOTE — Discharge Instructions (Signed)
[facility] ______[facility] °LAPAROSCOPIC SURGERY: POST OP INSTRUCTIONS °Always review your discharge instruction sheet given to you by the facility where your surgery was performed. °IF YOU HAVE DISABILITY OR FAMILY LEAVE FORMS, YOU MUST BRING THEM TO THE OFFICE FOR PROCESSING.   °DO NOT GIVE THEM TO YOUR DOCTOR. ° °1. A prescription for pain medication may be given to you upon discharge.  Take your pain medication as prescribed, if needed.  If narcotic pain medicine is not needed, then you may take acetaminophen (Tylenol) or ibuprofen (Advil) as needed. °2. Take your usually prescribed medications unless otherwise directed. °3. If you need a refill on your pain medication, please contact your pharmacy.  They will contact our office to request authorization. Prescriptions will not be filled after 5pm or on week-ends. °4. You should follow a light diet the first few days after arrival home, such as soup and crackers, etc.  Be sure to include lots of fluids daily. °5. Most patients will experience some swelling and bruising in the area of the incisions.  Ice packs will help.  Swelling and bruising can take several days to resolve.  °6. It is common to experience some constipation if taking pain medication after surgery.  Increasing fluid intake and taking a stool softener (such as Colace) will usually help or prevent this problem from occurring.  A mild laxative (Milk of Magnesia or Miralax) should be taken according to package instructions if there are no bowel movements after 48 hours. °7. Unless discharge instructions indicate otherwise, you may remove your bandages 24-48 hours after surgery, and you may shower at that time.  You may have steri-strips (small skin tapes) in place directly over the incision.  These strips should be left on the skin for 7-10 days.  If your surgeon used skin glue on the incision, you may shower in 24 hours.  The glue will flake off over the next 2-3 weeks.  Any sutures or  staples will be removed at the office during your follow-up visit. °8. ACTIVITIES:  You may resume regular (light) daily activities beginning the next day--such as daily self-care, walking, climbing stairs--gradually increasing activities as tolerated.  You may have sexual intercourse when it is comfortable.  Refrain from any heavy lifting or straining until approved by your doctor. °a. You may drive when you are no longer taking prescription pain medication, you can comfortably wear a seatbelt, and you can safely maneuver your car and apply brakes. °b. RETURN TO WORK:  __________________________________________________________ °9. You should see your doctor in the office for a follow-up appointment approximately 2-3 weeks after your surgery.  Make sure that you call for this appointment within a day or two after you arrive home to insure a convenient appointment time. °10. OTHER INSTRUCTIONS: __________________________________________________________________________________________________________________________ __________________________________________________________________________________________________________________________ °WHEN TO CALL YOUR DOCTOR: °1. Fever over 101.0 °2. Inability to urinate °3. Continued bleeding from incision. °4. Increased pain, redness, or drainage from the incision. °5. Increasing abdominal pain ° °The clinic staff is available to answer your questions during regular business hours.  Please don’t hesitate to call and ask to speak to one of the nurses for clinical concerns.  If you have a medical emergency, go to the nearest emergency room or call 911.  A surgeon from [facility] is always on call at the hospital. °[address], [address], [place], [place]  [address] ? [address] [address], [place], [place]   [address] °[phone] ? [phone] ? FAX [phone] °Web site:   [email or web address] °

## 2017-02-12 NOTE — Progress Notes (Signed)
1 Day Post-Op   Subjective/Chief Complaint: Not passing gas yet.  Having a fair amount of pulling when trying to get up and move around.     Objective: Vital signs in last 24 hours: Temp:  [98 F (36.7 C)-98.7 F (37.1 C)] 98.3 F (36.8 C) (08/21 0535) Pulse Rate:  [87-107] 87 (08/21 0535) Resp:  [11-19] 18 (08/21 0535) BP: (100-131)/(57-77) 100/57 (08/21 0535) SpO2:  [93 %-99 %] 98 % (08/21 0535) Last BM Date: 02/10/17  Intake/Output from previous day: 08/20 0701 - 08/21 0700 In: 1360 [P.O.:660; I.V.:700] Out: 1250 [Urine:1200; Blood:50] Intake/Output this shift: No intake/output data recorded.  General appearance: alert, cooperative and mild distress Resp: breathing comfortably GI: soft, mildly distended, approp tender.  incisions c/d/i. Extremities: extremities normal, atraumatic, no cyanosis or edema  Lab Results:   Recent Labs  02/11/17 1620 02/12/17 0225  WBC 10.7* 13.9*  HGB 14.7 13.6  HCT 46.3* 42.1  PLT 218 228   BMET  Recent Labs  02/11/17 1620 02/12/17 0225  NA  --  136  K  --  4.2  CL  --  104  CO2  --  24  GLUCOSE  --  161*  BUN  --  7  CREATININE 1.09* 1.09*  CALCIUM  --  9.0   PT/INR No results for input(s): LABPROT, INR in the last 72 hours. ABG No results for input(s): PHART, HCO3 in the last 72 hours.  Invalid input(s): PCO2, PO2  Studies/Results: No results found.  Anti-infectives: Anti-infectives    Start     Dose/Rate Route Frequency Ordered Stop   02/11/17 1600  ceFAZolin (ANCEF) IVPB 2g/100 mL premix     2 g 200 mL/hr over 30 Minutes Intravenous Every 8 hours 02/11/17 1445 02/11/17 1618   02/11/17 0636  ceFAZolin (ANCEF) IVPB 2g/100 mL premix     2 g 200 mL/hr over 30 Minutes Intravenous On call to O.R. 02/11/17 1103 02/11/17 1594      Assessment/Plan: s/p Procedure(s): HAND ASSISTED LAPAROSCOPIC LYMPH NODE BIOPSY (N/A) work on pain control and mobility.  Pt at high risk for ileus given surgery in the root of the  small bowel mesentery. Would like her to be passing gas prior to discharge. Home when bowel function returned.     LOS: 1 day    [facility] 02/12/2017

## 2017-02-13 DIAGNOSIS — C8193 Hodgkin lymphoma, unspecified, intra-abdominal lymph nodes: Secondary | ICD-10-CM | POA: Diagnosis not present

## 2017-02-13 LAB — BASIC METABOLIC PANEL
Anion gap: 4 — ABNORMAL LOW (ref 5–15)
BUN: 9 mg/dL (ref 6–20)
CHLORIDE: 105 mmol/L (ref 101–111)
CO2: 29 mmol/L (ref 22–32)
CREATININE: 1.18 mg/dL — AB (ref 0.44–1.00)
Calcium: 8.9 mg/dL (ref 8.9–10.3)
GFR calc non Af Amer: 58 mL/min — ABNORMAL LOW (ref >60)
Glucose, Bld: 102 mg/dL — ABNORMAL HIGH (ref 65–99)
POTASSIUM: 4.6 mmol/L (ref 3.5–5.1)
SODIUM: 138 mmol/L (ref 135–145)

## 2017-02-13 LAB — CBC
HCT: 40.4 % (ref 36.0–46.0)
HEMOGLOBIN: 12.8 g/dL (ref 12.0–15.0)
MCH: 25.4 pg — ABNORMAL LOW (ref 26.0–34.0)
MCHC: 31.7 g/dL (ref 30.0–36.0)
MCV: 80.2 fL (ref 78.0–100.0)
Platelets: 185 10*3/uL (ref 150–400)
RBC: 5.04 MIL/uL (ref 3.87–5.11)
RDW: 14.3 % (ref 11.5–15.5)
WBC: 7.5 10*3/uL (ref 4.0–10.5)

## 2017-02-13 MED ORDER — ALUM & MAG HYDROXIDE-SIMETH 200-200-20 MG/5ML PO SUSP
30.0000 mL | Freq: Four times a day (QID) | ORAL | Status: DC | PRN
  Administered 2017-02-13: 30 mL via ORAL
  Filled 2017-02-13: qty 30

## 2017-02-13 MED ORDER — ALUM & MAG HYDROXIDE-SIMETH 200-200-20 MG/5ML PO SUSP: 30.0000 mL | Freq: Four times a day (QID) | ORAL | 0 refills | Status: DC | PRN

## 2017-02-13 MED ORDER — ONDANSETRON HCL 4 MG PO TABS: 4.0000 mg | ORAL_TABLET | Freq: Every day | ORAL | 1 refills | Status: DC | PRN

## 2017-02-13 NOTE — Discharge Summary (Signed)
Physician Discharge Summary  Patient ID: Bonnie Larsen MRN: [id number] DOB/AGE: [birth year] 38 y.o.  Admit date: 02/11/2017 Discharge date: 02/14/2017  Admission Diagnoses: Patient Active Problem List   Diagnosis Date Noted  . Mesenteric lymphadenopathy 02/11/2017  . Adenopathy 01/10/2017  . 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 Abnormal chest CT 01/07/2017  . History of panic attacks   . Bacterial pharyngitis 07/30/2016    Discharge Diagnoses:  Active Problems:   Mesenteric lymphadenopathy same  Discharged Condition: stable  Hospital Course: Pt was admitted to the floor following hand assisted laparoscopic mesenteric lymph node biopsy.  She was a little queasy and not passing gas on POD 1.  Over the next 24 hours, she was able to ambulate and began passing gas.  She had adequate pain control with oral meds.  She was able to void independently.    Consults: None  Significant Diagnostic Studies: labs: WBCs 7.5, HCT 40.5, Cr 1.18 prior to d/c.    Treatments: surgery: see above  Discharge Exam: Blood pressure (!) 95/55, pulse 60, temperature 98.1 F (36.7 C), temperature source Oral, resp. rate 19, height 5\' 3"  (1.6 m), weight 103.4 kg (228 lb), last menstrual period 01/14/2017, SpO2 96 %. General appearance: alert, cooperative and no distress Resp: breathing comfortably GI: soft, non tender, incision c/d/i.    Disposition: 01-Home or Self Care  Discharge Instructions    Call MD for:  difficulty breathing, headache or visual disturbances    Complete by:  As directed    Call MD for:  persistant nausea and vomiting    Complete by:  As directed    Call MD for:  redness, tenderness, or signs of infection (pain, swelling, redness, odor or green/yellow discharge around incision site)    Complete by:  As directed    Call MD for:  severe uncontrolled pain    Complete by:  As directed    Call MD for:  temperature >100.4    Complete by:  As directed    Diet - low sodium heart healthy     Complete by:  As directed    Increase activity slowly    Complete by:  As directed      Allergies as of 02/13/2017      Reactions   Clindamycin/lincomycin Swelling   Pt experienced facial swelling to eyes, lips, cheeks, and started to panic.      Medication List    TAKE these medications   ALAWAY OP Apply 1 drop to eye as needed (allergies).   alum & mag hydroxide-simeth 200-200-20 MG/5ML suspension Commonly known as:  MAALOX/MYLANTA Take 30 mLs by mouth every 6 (six) hours as needed for indigestion or heartburn.   aspirin-acetaminophen-caffeine 250-250-65 MG tablet Commonly known as:  EXCEDRIN MIGRAINE Take 1 tablet by mouth daily as needed for migraine.   LORazepam 1 MG tablet Commonly known as:  ATIVAN Take 1 mg by mouth 2 (two) times daily as needed for anxiety.   MUCINEX NASAL SPRAY FULL FORCE NA Place 1 spray into the nose daily as needed (congestion).   [place] SINUS-MAX 5-10-200-325 MG Caps Generic drug:  Phenylephrine-DM-GG-APAP Take 1 tablet by mouth daily as needed (sinus headache).   naproxen sodium 220 MG tablet Commonly known as:  ANAPROX Take 440 mg by mouth 2 (two) times daily as needed (pain).   ondansetron 4 MG tablet Commonly known as:  ZOFRAN Take 1 tablet (4 mg total) by mouth daily as needed for nausea or vomiting.   oxyCODONE 5 MG immediate  release tablet Commonly known as:  Oxy IR/ROXICODONE Take 1-2 tablets (5-10 mg total) by mouth every 4 (four) hours as needed for moderate pain.   phenylephrine 10 MG Tabs tablet Commonly known as:  SUDAFED PE Take 10 mg by mouth daily as needed (sinus pressure).   senna 8.6 MG Tabs tablet Commonly known as:  SENOKOT Take 1 tablet (8.6 mg total) by mouth 2 (two) times daily.            Discharge Care Instructions        Start     Ordered   02/13/17 0000  Diet - low sodium heart healthy     02/13/17 0724   02/13/17 0000  Increase activity slowly     02/13/17 0724   02/13/17 0000  Call MD  for:  temperature >100.4     02/13/17 0724   02/13/17 0000  Call MD for:  persistant nausea and vomiting     02/13/17 0724   02/13/17 0000  Call MD for:  severe uncontrolled pain     02/13/17 0724   02/13/17 0000  Call MD for:  redness, tenderness, or signs of infection (pain, swelling, redness, odor or green/yellow discharge around incision site)     02/13/17 0724   02/13/17 0000  Call MD for:  difficulty breathing, headache or visual disturbances     02/13/17 0724   02/13/17 0000  ondansetron (ZOFRAN) 4 MG tablet  Daily PRN     02/13/17 0724   02/13/17 0000  alum & mag hydroxide-simeth (MAALOX/MYLANTA) 200-200-20 MG/5ML suspension  Every 6 hours PRN     02/13/17 0803   02/12/17 0000  senna (SENOKOT) 8.6 MG TABS tablet  2 times daily     02/12/17 0837   02/12/17 0000  oxyCODONE (OXY IR/ROXICODONE) 5 MG immediate release tablet  Every 4 hours PRN     02/12/17 0837     Follow-up Information    Stark Klein, MD Follow up in 2 week(s).   Specialty:  General Surgery Contact information: [address] [place] [address] [phone]           Signed: Stark Klein 02/14/2017, 2:14 PM

## 2017-02-13 NOTE — Progress Notes (Signed)
Discharge instructions gone over with patient and spouse. Home medications reviewed. Prescription given. Follow up appointment to be made. Diet, activity, incisional care, and reasons to call the doctor discussed. Signs and symptoms of infection reviewed. Patient verbalized understanding of instructions.

## 2017-02-14 ENCOUNTER — Other Ambulatory Visit: Payer: Self-pay | Admitting: *Deleted

## 2017-02-14 MED ORDER — TRAMADOL HCL 50 MG PO TABS: 50.0000 mg | ORAL_TABLET | Freq: Four times a day (QID) | ORAL | 0 refills | Status: DC | PRN

## 2017-02-18 ENCOUNTER — Other Ambulatory Visit: Payer: Self-pay | Admitting: General Surgery

## 2017-02-18 NOTE — Progress Notes (Signed)
Discussed diagnosis of lymphoma with patient at visit.  She will receive follow up appt with Dr. [name] in the cancer center.

## 2017-02-19 ENCOUNTER — Encounter (HOSPITAL_COMMUNITY): Payer: Self-pay | Admitting: *Deleted

## 2017-02-19 ENCOUNTER — Telehealth: Payer: Self-pay

## 2017-02-19 DIAGNOSIS — R0989 Other specified symptoms and signs involving the circulatory and respiratory systems: Secondary | ICD-10-CM

## 2017-02-19 HISTORY — DX: Other specified symptoms and signs involving the circulatory and respiratory systems: R09.89

## 2017-02-19 NOTE — Telephone Encounter (Signed)
Request from Dr. [name] to see pt on 9/4 with labs. No availability on schedule until 9/10. Lab 1400 and MD 1420. Pt verbalized understanding.

## 2017-02-19 NOTE — Progress Notes (Signed)
Ensure Pre-surgery drink given with instructions to complete by 0600 dos, pt verbalized understanding.

## 2017-02-20 ENCOUNTER — Encounter (HOSPITAL_BASED_OUTPATIENT_CLINIC_OR_DEPARTMENT_OTHER)
Admission: RE | Disposition: A | Discharge status: Home / Self Care | Payer: Self-pay | Source: Ambulatory Visit | Attending: General Surgery

## 2017-02-20 ENCOUNTER — Ambulatory Visit (HOSPITAL_BASED_OUTPATIENT_CLINIC_OR_DEPARTMENT_OTHER): Payer: 59 | Admitting: Certified Registered"

## 2017-02-20 ENCOUNTER — Ambulatory Visit (HOSPITAL_COMMUNITY): Payer: 59

## 2017-02-20 ENCOUNTER — Encounter (HOSPITAL_BASED_OUTPATIENT_CLINIC_OR_DEPARTMENT_OTHER): Payer: Self-pay | Admitting: Certified Registered"

## 2017-02-20 ENCOUNTER — Ambulatory Visit (HOSPITAL_COMMUNITY)
Admission: RE | Admit: 2017-02-20 | Discharge: 2017-02-20 | Disposition: A | Discharge status: Home / Self Care | Payer: 59 | Source: Ambulatory Visit | Attending: General Surgery | Admitting: General Surgery

## 2017-02-20 DIAGNOSIS — Z803 Family history of malignant neoplasm of breast: Secondary | ICD-10-CM | POA: Diagnosis not present

## 2017-02-20 DIAGNOSIS — C8590 Non-Hodgkin lymphoma, unspecified, unspecified site: Secondary | ICD-10-CM | POA: Insufficient documentation

## 2017-02-20 DIAGNOSIS — Z8249 Family history of ischemic heart disease and other diseases of the circulatory system: Secondary | ICD-10-CM | POA: Diagnosis not present

## 2017-02-20 DIAGNOSIS — F419 Anxiety disorder, unspecified: Secondary | ICD-10-CM | POA: Insufficient documentation

## 2017-02-20 DIAGNOSIS — E119 Type 2 diabetes mellitus without complications: Secondary | ICD-10-CM | POA: Diagnosis not present

## 2017-02-20 DIAGNOSIS — Z6839 Body mass index (BMI) 39.0-39.9, adult: Secondary | ICD-10-CM | POA: Diagnosis not present

## 2017-02-20 DIAGNOSIS — Z95828 Presence of other vascular implants and grafts: Secondary | ICD-10-CM

## 2017-02-20 HISTORY — PX: PORTACATH PLACEMENT: SHX2246

## 2017-02-20 HISTORY — DX: Non-Hodgkin lymphoma, unspecified, unspecified site: C85.90

## 2017-02-20 HISTORY — DX: Other seasonal allergic rhinitis: J30.2

## 2017-02-20 HISTORY — DX: Other specified symptoms and signs involving the circulatory and respiratory systems: R09.89

## 2017-02-20 HISTORY — DX: Other specified health status: Z78.9

## 2017-02-20 SURGERY — INSERTION, TUNNELED CENTRAL VENOUS DEVICE, WITH PORT
Anesthesia: General | Site: Chest

## 2017-02-20 MED ORDER — BUPIVACAINE HCL 0.5 % IJ SOLN
INTRAMUSCULAR | Status: DC | PRN
  Administered 2017-02-20: 4 mL

## 2017-02-20 MED ORDER — OXYCODONE HCL 5 MG PO TABS: 5.0000 mg | ORAL_TABLET | ORAL | 0 refills | Status: DC | PRN

## 2017-02-20 MED ORDER — LACTATED RINGERS IV SOLN
INTRAVENOUS | Status: DC
  Administered 2017-02-20: 09:00:00 via INTRAVENOUS

## 2017-02-20 MED ORDER — DEXAMETHASONE SODIUM PHOSPHATE 4 MG/ML IJ SOLN
INTRAMUSCULAR | Status: DC | PRN
  Administered 2017-02-20: 10 mg via INTRAVENOUS

## 2017-02-20 MED ORDER — CEFAZOLIN SODIUM-DEXTROSE 2-4 GM/100ML-% IV SOLN
2.0000 g | INTRAVENOUS | Status: AC
  Administered 2017-02-20: 2 g via INTRAVENOUS

## 2017-02-20 MED ORDER — PROPOFOL 10 MG/ML IV BOLUS
INTRAVENOUS | Status: DC | PRN
  Administered 2017-02-20: 200 mg via INTRAVENOUS

## 2017-02-20 MED ORDER — FENTANYL CITRATE (PF) 100 MCG/2ML IJ SOLN
25.0000 ug | INTRAMUSCULAR | Status: DC | PRN
  Administered 2017-02-20: 50 ug via INTRAVENOUS
  Administered 2017-02-20 (×2): 25 ug via INTRAVENOUS

## 2017-02-20 MED ORDER — CHLORHEXIDINE GLUCONATE CLOTH 2 % EX PADS
6.0000 | MEDICATED_PAD | Freq: Once | CUTANEOUS | Status: DC
Start: 2017-02-20 — End: 2017-02-20

## 2017-02-20 MED ORDER — ONDANSETRON HCL 4 MG/2ML IJ SOLN: 4.0000 mg | Freq: Once | INTRAMUSCULAR | Status: DC | PRN

## 2017-02-20 MED ORDER — BUPIVACAINE-EPINEPHRINE (PF) 0.5% -1:200000 IJ SOLN
INTRAMUSCULAR | Status: AC
  Filled 2017-02-20: qty 30

## 2017-02-20 MED ORDER — FENTANYL CITRATE (PF) 100 MCG/2ML IJ SOLN
INTRAMUSCULAR | Status: AC
  Filled 2017-02-20: qty 2

## 2017-02-20 MED ORDER — LIDOCAINE-EPINEPHRINE (PF) 1 %-1:200000 IJ SOLN
INTRAMUSCULAR | Status: AC
  Filled 2017-02-20: qty 30

## 2017-02-20 MED ORDER — CEFAZOLIN SODIUM-DEXTROSE 2-4 GM/100ML-% IV SOLN
INTRAVENOUS | Status: AC
  Filled 2017-02-20: qty 100

## 2017-02-20 MED ORDER — HEPARIN (PORCINE) IN NACL 2-0.9 UNIT/ML-% IJ SOLN
INTRAMUSCULAR | Status: AC | PRN
  Administered 2017-02-20: 1 via INTRAVENOUS

## 2017-02-20 MED ORDER — SCOPOLAMINE 1 MG/3DAYS TD PT72: 1.0000 | MEDICATED_PATCH | Freq: Once | TRANSDERMAL | Status: DC | PRN

## 2017-02-20 MED ORDER — ONDANSETRON HCL 4 MG/2ML IJ SOLN
INTRAMUSCULAR | Status: DC | PRN
  Administered 2017-02-20: 4 mg via INTRAVENOUS

## 2017-02-20 MED ORDER — MIDAZOLAM HCL 2 MG/2ML IJ SOLN
INTRAMUSCULAR | Status: AC
  Filled 2017-02-20: qty 2

## 2017-02-20 MED ORDER — LIDOCAINE-EPINEPHRINE (PF) 1 %-1:200000 IJ SOLN
INTRAMUSCULAR | Status: DC | PRN
  Administered 2017-02-20: 4 mL

## 2017-02-20 MED ORDER — CHLORHEXIDINE GLUCONATE CLOTH 2 % EX PADS: 6.0000 | MEDICATED_PAD | Freq: Once | CUTANEOUS | Status: DC

## 2017-02-20 MED ORDER — LIDOCAINE HCL (CARDIAC) 20 MG/ML IV SOLN
INTRAVENOUS | Status: DC | PRN
  Administered 2017-02-20: 60 mg via INTRAVENOUS

## 2017-02-20 MED ORDER — MEPERIDINE HCL 25 MG/ML IJ SOLN: 6.2500 mg | INTRAMUSCULAR | Status: DC | PRN

## 2017-02-20 MED ORDER — HEPARIN (PORCINE) IN NACL 2-0.9 UNIT/ML-% IJ SOLN
INTRAMUSCULAR | Status: AC
  Filled 2017-02-20: qty 500

## 2017-02-20 MED ORDER — MIDAZOLAM HCL 2 MG/2ML IJ SOLN: 1.0000 mg | INTRAMUSCULAR | Status: DC | PRN

## 2017-02-20 MED ORDER — FENTANYL CITRATE (PF) 100 MCG/2ML IJ SOLN
50.0000 ug | INTRAMUSCULAR | Status: DC | PRN
  Administered 2017-02-20 (×2): 50 ug via INTRAVENOUS

## 2017-02-20 MED ORDER — HEPARIN SOD (PORK) LOCK FLUSH 100 UNIT/ML IV SOLN
INTRAVENOUS | Status: AC
  Filled 2017-02-20: qty 5

## 2017-02-20 MED ORDER — HEPARIN SOD (PORK) LOCK FLUSH 100 UNIT/ML IV SOLN
INTRAVENOUS | Status: DC | PRN
  Administered 2017-02-20: 500 [IU] via INTRAVENOUS

## 2017-02-20 SURGICAL SUPPLY — 42 items
BAG DECANTER FOR FLEXI CONT (MISCELLANEOUS) ×3 IMPLANT
BLADE HEX COATED 2.75 (ELECTRODE) ×3 IMPLANT
BLADE SURG 11 STRL SS (BLADE) ×3 IMPLANT
BLADE SURG 15 STRL LF DISP TIS (BLADE) ×1 IMPLANT
BLADE SURG 15 STRL SS (BLADE) ×2
CHLORAPREP W/TINT 26ML (MISCELLANEOUS) ×3 IMPLANT
COVER BACK TABLE 60X90IN (DRAPES) ×3 IMPLANT
COVER MAYO STAND STRL (DRAPES) ×3 IMPLANT
DECANTER SPIKE VIAL GLASS SM (MISCELLANEOUS) IMPLANT
DERMABOND ADVANCED (GAUZE/BANDAGES/DRESSINGS) ×2
DERMABOND ADVANCED .7 DNX12 (GAUZE/BANDAGES/DRESSINGS) ×1 IMPLANT
DRAPE C-ARM 42X72 X-RAY (DRAPES) ×3 IMPLANT
DRAPE LAPAROTOMY TRNSV 102X78 (DRAPE) ×3 IMPLANT
DRAPE UTILITY XL STRL (DRAPES) ×3 IMPLANT
DRSG TEGADERM 4X4.75 (GAUZE/BANDAGES/DRESSINGS) IMPLANT
ELECT REM PT RETURN 9FT ADLT (ELECTROSURGICAL) ×3
ELECTRODE REM PT RTRN 9FT ADLT (ELECTROSURGICAL) ×1 IMPLANT
GAUZE SPONGE 4X4 12PLY STRL LF (GAUZE/BANDAGES/DRESSINGS) IMPLANT
GLOVE BIO SURGEON STRL SZ 6 (GLOVE) ×3 IMPLANT
GLOVE BIO SURGEON STRL SZ 6.5 (GLOVE) ×2 IMPLANT
GLOVE BIO SURGEONS STRL SZ 6.5 (GLOVE) ×1
GLOVE BIOGEL PI IND STRL 6.5 (GLOVE) ×1 IMPLANT
GLOVE BIOGEL PI INDICATOR 6.5 (GLOVE) ×2
GOWN STRL REUS W/ TWL LRG LVL3 (GOWN DISPOSABLE) ×1 IMPLANT
GOWN STRL REUS W/TWL 2XL LVL3 (GOWN DISPOSABLE) ×3 IMPLANT
GOWN STRL REUS W/TWL LRG LVL3 (GOWN DISPOSABLE) ×2
IV CONNECTOR ONE LINK NDLESS (IV SETS) IMPLANT
KIT PORT POWER 8FR ISP CVUE (Miscellaneous) ×3 IMPLANT
NEEDLE HYPO 25X1 1.5 SAFETY (NEEDLE) ×3 IMPLANT
PACK BASIN DAY SURGERY FS (CUSTOM PROCEDURE TRAY) ×3 IMPLANT
PENCIL BUTTON HOLSTER BLD 10FT (ELECTRODE) ×3 IMPLANT
SLEEVE SCD COMPRESS KNEE MED (MISCELLANEOUS) ×3 IMPLANT
SUT MNCRL AB 4-0 PS2 18 (SUTURE) ×3 IMPLANT
SUT PROLENE 2 0 SH DA (SUTURE) ×6 IMPLANT
SUT VIC AB 3-0 SH 27 (SUTURE) ×2
SUT VIC AB 3-0 SH 27X BRD (SUTURE) ×1 IMPLANT
SUT VICRYL 3-0 CR8 SH (SUTURE) IMPLANT
SYR 10ML LL (SYRINGE) ×3 IMPLANT
SYR 5ML LUER SLIP (SYRINGE) ×3 IMPLANT
SYR CONTROL 10ML LL (SYRINGE) ×3 IMPLANT
TOWEL OR 17X24 6PK STRL BLUE (TOWEL DISPOSABLE) ×3 IMPLANT
TOWEL OR NON WOVEN STRL DISP B (DISPOSABLE) ×3 IMPLANT

## 2017-02-20 NOTE — Transfer of Care (Signed)
Immediate Anesthesia Transfer of Care Note  Patient: Bonnie Larsen  Procedure(s) Performed: Procedure(s): INSERTION PORT-A-CATH (N/A)  Patient Location: PACU  Anesthesia Type:General  Level of Consciousness: awake, alert , oriented and patient cooperative  Airway & Oxygen Therapy: Patient Spontanous Breathing and Patient connected to face mask oxygen  Post-op Assessment: Report given to RN and Post -op Vital signs reviewed and stable  Post vital signs: Reviewed and stable  Last Vitals:  Vitals:   02/20/17 0814  BP: 124/76  Pulse: 90  Resp: 16  Temp: 36.5 C  SpO2: 100%    Last Pain:  Vitals:   02/20/17 0814  TempSrc: Oral  PainSc: 0-No pain      Patients Stated Pain Goal: 3 (19/37/90 2409)  Complications: No apparent anesthesia complications

## 2017-02-20 NOTE — Anesthesia Preprocedure Evaluation (Signed)
Anesthesia Evaluation  Patient identified by MRN, date of birth, ID band Patient awake    Reviewed: Allergy & Precautions, NPO status , Patient's Chart, lab work & pertinent test results  Airway Mallampati: II  TM Distance: >3 FB Neck ROM: Full    Dental no notable dental hx.    Pulmonary neg pulmonary ROS,    Pulmonary exam normal breath sounds clear to auscultation       Cardiovascular negative cardio ROS Normal cardiovascular exam Rhythm:Regular Rate:Normal  ECG: SR, rate 97   Neuro/Psych  Headaches, Anxiety    GI/Hepatic negative GI ROS, Neg liver ROS,   Endo/Other  Morbid obesity  Renal/GU negative Renal ROS     Musculoskeletal negative musculoskeletal ROS (+)   Abdominal (+) + obese,   Peds negative pediatric ROS (+)  Hematology negative hematology ROS (+)   Anesthesia Other Findings   Reproductive/Obstetrics                             Anesthesia Physical  Anesthesia Plan  ASA: II  Anesthesia Plan: General   Post-op Pain Management:    Induction: Intravenous  PONV Risk Score and Plan: 4 or greater and Ondansetron, Dexamethasone and Midazolam  Airway Management Planned: LMA  Additional Equipment:   Intra-op Plan:   Post-operative Plan: Extubation in OR  Informed Consent: I have reviewed the patients History and Physical, chart, labs and discussed the procedure including the risks, benefits and alternatives for the proposed anesthesia with the patient or authorized representative who has indicated his/her understanding and acceptance.   Dental advisory given  Plan Discussed with: CRNA  Anesthesia Plan Comments:         Anesthesia Quick Evaluation

## 2017-02-20 NOTE — H&P (View-Only) (Signed)
Bonnie Larsen 02/05/2017 3:28 PM Location: [facility] Patient #: [id number] DOB: 07/20/1980 Married / Language: English / Race: Black or African American Female   History of Present Illness [name]; 02/05/2017 5:13 PM) The patient is a 36 year old female who presents with lymphadenopathy. Patient is a 36 year old female referred for consultation by Dr. [name] for a new diagnosis of lymphadenopathy. She presented to the emergency department in mid July with shortness of breath, chest pain, posterior neck pain and bilateral jaw pain. Imaging showed thoracic lymphadenopathy. Because the lymphadenopathy she also had a CT of the abdomen and pelvis. this showed significant upper abdominal lymphadenopathy. Because of that she was referred to oncology and underwent a CT-guided biopsy of the retroperitoneal lymphadenopathy. This was unrevealing. She has subsequently had a PET scan which showed similar findings. The lymphadenopathy was hypermetabolic. Notably, there is nothing in the cervical region axillary region or inguinal region.  The patient denies family history of lymphoma, but her mother had breast cancer. She denies night sweats or weight loss. She is not having pain. Since that time showing the emergency department she has been feeling reasonably well. She feels a little bit of fullness in her upper abdomen but has not noted any early satiety, nausea or vomiting.   PET 02/01/17 IMPRESSION: 1. Hypermetabolic retroperitoneal, mesenteric, peripancreatic, porta hepatis, and retrocrural adenopathy. 2. Nodularity in the left paraspinal, subcarinal, and paraesophageal regions in the chest as lower but still abnormal metabolic activity, and probably represents adenopathy rather than extramedullary hematopoiesis. There is also a faintly metabolic lesion in the right breast which probably represents the same lesion biopsied in 2016, with benign results. 3. Photopenic cystic right  adnexal lesion. An IUD is present in the uterus.   CT abd/pelvis 01/10/17 IMPRESSION: 1. Extensive upper abdominal adenopathy favoring lymphoma/lymphoproliferative disease, tissue diagnosis recommended. No splenomegaly or focal splenic lesion. 2. 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 No hydronephrosis. 3. 3.2 cm in long axis right adnexal cyst. In the absence of symptoms this usually would not require further workup in this age group.  CT angio chest 01/06/2017 IMPRESSION: 1. No acute pulmonary embolism. 2. Multiple prevertebral paraspinal masses, differential diagnosis includes extramedullary hematopoiesis and metastasis/lymphadenopathy. Recommend correlation for laboratory analysis for anemia and lymphoproliferative disease. Critical Value/emergent results were called by telephone at the time of interpretation on 01/06/2017 at 3:39 am to Dr. Delora Fuel , who verbally acknowledged these results.    Problem List/Past Medical [name], CMA; 02/05/2017 3:32 PM) Anxiety  Migraines   Past Surgical History [name], CMA; 02/05/2017 3:33 PM) No Surgeries   Allergies [name], CMA; 02/05/2017 3:40 PM) Clindamycin HCl ([place]) *CHEMICALS*   Medication History ([name], CMA; 02/05/2017 3:40 PM) TraMADol HCl (50MG  Tablet, Oral) Active. Medications Reconciled  Social History [name], CMA; 02/05/2017 3:31 PM) No drug use  Caffeine use  Tea, Carbonated beverages. No alcohol use   Family History (Michelle R. Rolena Infante, CMA; 02/05/2017 3:31 PM) Alcohol Abuse  Father. Anesthetic complications  Mother. Arthritis  Mother. Breast Cancer  Mother. Heart Disease  Father. Diabetes Mellitus  High Blood Pressure / Hypertension  Other Cancer   Pregnancy / Birth History [name], CMA; 02/05/2017 3:33 PM) Age at menarche  76 years. Irregular periods  Pregnancies (Gravida)   4. Deliveries (Parity)  2. Mammogram  1-3 years ago. Pap smear  1-5 years ago.    Review of Systems [name]; 02/05/2017 5:13 PM)  Cardiovascular Present- Leg Cramps. Gastrointestinal Present- Abdominal Pain, Bloating, Excessive gas and Gets full quickly at meals. Female Genitourinary Present- Urgency. Musculoskeletal Present- Muscle Pain. Psychiatric Present- Anxiety and Fearful. All other systems negative  Vitals [name] CMA; 02/05/2017 4:03 PM) 02/05/2017 3:39 PM Weight: 227.25 lb Height: 63.5in Body Surface Area: 2.05 m Body Mass Index: 39.62 kg/m  Temp.: 97.26F(Oral)  Pulse: 80 (Regular)  BP: 124/76 (Sitting, Left Arm, Standard)       Physical Exam [name]; 02/05/2017 5:13 PM) General Mental Status-Alert. General Appearance-Consistent with stated age. Hydration-Well hydrated. Voice-Normal.  Head and Neck Head-normocephalic, atraumatic with no lesions or palpable masses. Trachea-midline. Thyroid Gland Characteristics - normal size and consistency.  Eye Eyeball - Bilateral-Extraocular movements intact. Sclera/Conjunctiva - Bilateral-No scleral icterus.  Chest and Lung Exam Chest and lung exam reveals -quiet, even and easy respiratory effort with no use of accessory muscles and on auscultation, normal breath sounds, no adventitious sounds and normal vocal resonance. Inspection Chest Wall - Normal. Back - normal.  Cardiovascular Cardiovascular examination reveals -normal heart sounds, regular rate and rhythm with no murmurs and normal pedal pulses bilaterally.  Abdomen Inspection Inspection of the abdomen reveals - No Hernias. Palpation/Percussion Palpation and Percussion of the abdomen reveal - Soft, Non Tender, No Rebound tenderness, No Rigidity (guarding) and No hepatosplenomegaly. Auscultation Auscultation of the abdomen reveals - Bowel sounds normal.  Neurologic Neurologic evaluation  reveals -alert and oriented x 3 with no impairment of recent or remote memory. Mental Status-Normal.  Musculoskeletal Global Assessment -Note: no gross deformities.  Normal Exam - Left-Upper Extremity Strength Normal and Lower Extremity Strength Normal. Normal Exam - Right-Upper Extremity Strength Normal and Lower Extremity Strength Normal.  Lymphatic Head & Neck  General Head & Neck Lymphatics: Bilateral - Description - Normal. Axillary  General Axillary Region: Bilateral - Description - Normal. Tenderness - Non Tender. Femoral & Inguinal  Generalized Femoral & Inguinal Lymphatics: Bilateral - Description - No Generalized lymphadenopathy.    Assessment & Plan [name]; 02/05/2017 5:25 PM) LYMPHADENOPATHY, ABDOMINAL (R59.0) Impression: The patient has already had an unrevealing core needle biopsy. unfortunately, I do think that she would benefit from a abdominal lymph node biopsy. This is difficult given that these lymph nodes are in the mesentery or the retroperitoneum. I advised the patient that I would almost certainly need at least a hand-assisted approach to safely get a good lymph node from the mesenteric location.  I will do this in an urgent fashion. I discussed the surgery with the patient. I reviewed the risks of bleeding, infection, damage to adjacent structures, possible prolonged ileus, blood clots, heart or lung complications, wound issues, and death. The patient wishes to proceed. We will do this at the first available opportunity.  She may have a non-neoplastic cause of lymphadenopathy such as sarcoid or infection. A larger piece of tissue should be helpful in differentiating. Current Plans You are being scheduled for surgery- Our schedulers will call you.  You should hear from our office's scheduling department within 5 working days about the location, date, and time of surgery. We try to make accommodations for patient's preferences in scheduling  surgery, but sometimes the OR schedule or the surgeon's schedule prevents [place] from making those accommodations.  If you have not heard from our office [phone]) in 5 working days, call the office and ask for your surgeon's nurse.  If you have other questions about your diagnosis, plan, or surgery, call the office and ask for your  surgeon's nurse.  Pt Education - [facility] Free Text Education/Instructions: discussed with patient and provided information.   Signed by Stark Klein, MD (02/05/2017 5:26 PM)

## 2017-02-20 NOTE — Discharge Instructions (Addendum)
[facility] Office Phone Number [phone]   POST OP INSTRUCTIONS  Always review your discharge instruction sheet given to you by the facility where your surgery was performed.  IF YOU HAVE DISABILITY OR FAMILY LEAVE FORMS, YOU MUST BRING THEM TO THE OFFICE FOR PROCESSING.  DO NOT GIVE THEM TO YOUR DOCTOR.  1. A prescription for pain medication may be given to you upon discharge.  Take your pain medication as prescribed, if needed.  If narcotic pain medicine is not needed, then you may take acetaminophen (Tylenol) or ibuprofen (Advil) as needed. 2. Take your usually prescribed medications unless otherwise directed 3. If you need a refill on your pain medication, please contact your pharmacy.  They will contact our office to request authorization.  Prescriptions will not be filled after 5pm or on week-ends. 4. You should eat very light the first 24 hours after surgery, such as soup, crackers, pudding, etc.  Resume your normal diet the day after surgery 5. It is common to experience some constipation if taking pain medication after surgery.  Increasing fluid intake and taking a stool softener will usually help or prevent this problem from occurring.  A mild laxative (Milk of Magnesia or Miralax) should be taken according to package directions if there are no bowel movements after 48 hours. 6. You may shower in 48 hours.  The surgical glue will flake off in 2-3 weeks.   7. ACTIVITIES:  No strenuous activity or heavy lifting for 1 week.   a. You may drive when you no longer are taking prescription pain medication, you can comfortably wear a seatbelt, and you can safely maneuver your car and apply brakes. b. RETURN TO WORK:  _________1 week as tolerated as long as no lifting.  Otherwise, 4-6 weeks._______________ Dennis Bast should see your doctor in the office for a follow-up appointment approximately three-four weeks after your surgery.    WHEN TO CALL YOUR DOCTOR: 1. Fever over  101.0 2. Nausea and/or vomiting. 3. Extreme swelling or bruising. 4. Continued bleeding from incision. 5. Increased pain, redness, or drainage from the incision.  The clinic staff is available to answer your questions during regular business hours.  Please dont hesitate to call and ask to speak to one of the nurses for clinical concerns.  If you have a medical emergency, go to the nearest emergency room or call 911.  A surgeon from [facility] is always on call at the hospital.  For further questions, please visit [email or web address]    Post Anesthesia Home Care Instructions  Activity: Get plenty of rest for the remainder of the day. A responsible individual must stay with you for 24 hours following the procedure.  For the next 24 hours, DO NOT: -Drive a car -Paediatric nurse -Drink alcoholic beverages -Take any medication unless instructed by your physician -Make any legal decisions or sign important papers.  Meals: Start with liquid foods such as gelatin or soup. Progress to regular foods as tolerated. Avoid greasy, spicy, heavy foods. If nausea and/or vomiting occur, drink only clear liquids until the nausea and/or vomiting subsides. Call your physician if vomiting continues.  Special Instructions/Symptoms: Your throat may feel dry or sore from the anesthesia or the breathing tube placed in your throat during surgery. If this causes discomfort, gargle with warm salt water. The discomfort should disappear within 24 hours.  If you had a scopolamine patch placed behind your ear for the management of post- operative nausea and/or vomiting:  1. The medication in  the patch is effective for 72 hours, after which it should be removed.  Wrap patch in a tissue and discard in the trash. Wash hands thoroughly with soap and water. 2. You may remove the patch earlier than 72 hours if you experience unpleasant side effects which may include dry mouth, dizziness or visual  disturbances. 3. Avoid touching the patch. Wash your hands with soap and water after contact with the patch.

## 2017-02-20 NOTE — Anesthesia Procedure Notes (Signed)
Procedure Name: LMA Insertion Date/Time: 02/20/2017 9:33 AM Performed by: Gearl Baratta D Pre-anesthesia Checklist: Patient identified, Emergency Drugs available, Suction available and Patient being monitored Patient Re-evaluated:Patient Re-evaluated prior to induction Oxygen Delivery Method: Circle system utilized Preoxygenation: Pre-oxygenation with 100% oxygen Induction Type: IV induction Ventilation: Mask ventilation without difficulty LMA: LMA inserted LMA Size: 3.0 Number of attempts: 1 Airway Equipment and Method: Bite block Placement Confirmation: positive ETCO2 Tube secured with: Tape Dental Injury: Teeth and Oropharynx as per pre-operative assessment

## 2017-02-20 NOTE — Op Note (Signed)
PREOPERATIVE DIAGNOSIS:  Lymphoma     POSTOPERATIVE DIAGNOSIS:  Same     PROCEDURE: Left subclavian port placement, Bard ClearVue  Power Port, MRI safe, 8-French.      SURGEON:  Stark Klein, MD      ANESTHESIA:  General   FINDINGS:  Good venous return, easy flush, and tip of the catheter and   SVC 25.5 cm.      SPECIMEN:  None.      ESTIMATED BLOOD LOSS:  Minimal.      COMPLICATIONS:  None known.      PROCEDURE:  Pt was identified in the holding area and taken to   the operating room, where patient was placed supine on the operating room   table.  General anesthesia was induced.  Patient's arms were tucked and the upper   chest and neck were prepped and draped in sterile fashion.  Time-out was   performed according to the surgical safety check list.  When all was   correct, we continued.   Local anesthetic was administered over this   area at the angle of the clavicle.  The vein was accessed with 2 pass(es) of the needle. There was good venous return and the wire passed easily with no ectopy.   Fluoroscopy was used to confirm that the wire was in the vena cava.      The patient was placed back level and the area for the pocket was anethetized   with local anesthetic.  A 3-cm transverse incision was made with a #15   blade.  Cautery was used to divide the subcutaneous tissues down to the   pectoralis muscle.  An [employer] retractor was used to elevate the skin   while a pocket was created on top of the pectoralis fascia.  The port   was placed into the pocket to confirm that it was of adequate size.  The   catheter was preattached to the port.  The port was then secured to the   pectoralis fascia with four 2-0 Prolene sutures.  These were clamped and   not tied down yet.    The catheter was tunneled through to the wire exit   site.  The catheter was placed along the wire to determine what length it should be to be in the SVC.  The catheter was cut at 25.5 cm.  The tunneler  sheath and dilator were passed over the wire and the dilator and wire were removed.  The catheter was advanced through the tunneler sheath and the tunneler sheath was pulled away.  Care was taken to keep the catheter in the tunneler sheath as this occurred. This was advanced and the tunneler sheath was removed.  There was good venous   return and easy flush of the catheter.  The Prolene sutures were tied   down to the pectoral fascia.  The skin was reapproximated using 3-0   Vicryl interrupted deep dermal sutures.    Fluoroscopy was used to re-confirm good position of the catheter.  The skin   was then closed using 4-0 Monocryl in a subcuticular fashion.  The port was flushed with concentrated heparin flush as well.  The wounds were then cleaned, dried, and dressed with Dermabond.  The patient was awakened from anesthesia and taken to the PACU in stable condition.  Needle, sponge, and instrument counts were correct.               Stark Klein, MD

## 2017-02-20 NOTE — Anesthesia Postprocedure Evaluation (Signed)
Anesthesia Post Note  Patient: Bonnie Larsen  Procedure(s) Performed: Procedure(s) (LRB): INSERTION PORT-A-CATH (N/A)     Patient location during evaluation: PACU Anesthesia Type: General Level of consciousness: awake and alert Pain management: pain level controlled Vital Signs Assessment: post-procedure vital signs reviewed and stable Respiratory status: spontaneous breathing, nonlabored ventilation, respiratory function stable and patient connected to nasal cannula oxygen Cardiovascular status: blood pressure returned to baseline and stable Postop Assessment: no signs of nausea or vomiting Anesthetic complications: no    Last Vitals:  Vitals:   02/20/17 1115 02/20/17 1130  BP: 118/78 114/73  Pulse: 90 92  Resp: (!) 0 17  Temp:    SpO2: 100% 97%    Last Pain:  Vitals:   02/20/17 1130  TempSrc:   PainSc: 8                  Stacy Deshler

## 2017-02-20 NOTE — Interval H&P Note (Signed)
History and Physical Interval Note:  02/20/2017 9:21 AM  Bonnie Larsen  has presented today for surgery, with the diagnosis of LYMPHOMA  The various methods of treatment have been discussed with the patient and family. After consideration of risks, benefits and other options for treatment, the patient has consented to  Procedure(s): INSERTION PORT-A-CATH (N/A) as a surgical intervention .  The patient's history has been reviewed, patient examined, no change in status, stable for surgery.  I have reviewed the patient's chart and labs.  Questions were answered to the patient's satisfaction.     Antonis Lor

## 2017-02-21 ENCOUNTER — Encounter (HOSPITAL_BASED_OUTPATIENT_CLINIC_OR_DEPARTMENT_OTHER): Payer: Self-pay | Admitting: General Surgery

## 2017-02-27 ENCOUNTER — Other Ambulatory Visit: Payer: Self-pay

## 2017-02-27 ENCOUNTER — Telehealth: Payer: Self-pay

## 2017-02-27 DIAGNOSIS — Z95828 Presence of other vascular implants and grafts: Secondary | ICD-10-CM

## 2017-02-27 MED ORDER — LIDOCAINE-PRILOCAINE 2.5-2.5 % EX CREA: TOPICAL_CREAM | CUTANEOUS | Status: DC | PRN

## 2017-02-27 NOTE — Telephone Encounter (Signed)
Pt call regarding emla cream and recent port placement. Pt to expect prescription for emla cream at [place]. Lab and flush appt adjusted for pt on 9/10.

## 2017-02-28 ENCOUNTER — Encounter (HOSPITAL_COMMUNITY): Payer: Self-pay

## 2017-03-01 ENCOUNTER — Other Ambulatory Visit: Payer: Self-pay | Admitting: *Deleted

## 2017-03-01 ENCOUNTER — Telehealth: Payer: Self-pay | Admitting: *Deleted

## 2017-03-01 DIAGNOSIS — R222 Localized swelling, mass and lump, trunk: Secondary | ICD-10-CM

## 2017-03-01 MED ORDER — TRAMADOL HCL 50 MG PO TABS: 50.0000 mg | ORAL_TABLET | Freq: Four times a day (QID) | ORAL | 0 refills | Status: DC | PRN

## 2017-03-01 NOTE — Telephone Encounter (Signed)
Pt called stating she still has not received rx for emla.  Informed pt that there is currently not a treatment plan ordered yet.  Emla will be released to pharmacy when treatment plan is entered.  Informed pt that we could order the cream for her if she prefers to have labs drawn from [facility] on 9/10.  Pt stated if she wasn't having treatment on 9/10, she did not necessarily need flush apt.  Flush apt cancelled.  Dr. [name] to discuss treatment with pt on 9/10.  Pt verbalized understanding.

## 2017-03-03 NOTE — Progress Notes (Signed)
HEMATOLOGY/ONCOLOGY CLINIC NOTE  Date of Service: 03/04/17  Patient Care Team: Girtha Rm, NP-C as PCP - General (Family Medicine)  CHIEF COMPLAINTS/PURPOSE OF CONSULTATION:  Thoracic and abdominal lymphadenopathy concerning for lymphoproliferative disorder  HISTORY OF PRESENTING ILLNESS:  Bonnie Larsen is a wonderful 36 y.o. female who has been referred to [place] by Dr .Raenette Rover, Laurian Brim, NP-C for evaluation and management of thoracic paraspinal masses and upper abdominal extensive lymphadenopathy.  Patient has a history of uterine fibroids, right breast benign lesion biopsied in 2016, anxiety/panic attacks, migraine headaches and chronic sinus issues who presented to the emergency room brought by EMS for chest pain and shortness of breath about 7-10 days ago. She was noted to be hypertensive with a blood pressure of 180/110 which improved with nitroglycerin. She was also having some back pain and felt like her chest pain was radiating to the back. D-dimer level was borderline elevated. She had a CTA of the chest on 01/06/2017 that showed no evidence of pulmonary embolism. She was however noted to have Multiple prevertebral paraspinal masses, differential diagnosis includes extramedullary hematopoiesis and metastasis/lymphadenopathy. Recommend correlation for laboratory analysis for anemia and lymphoproliferative disease.  Patient was subsequently seen by her primary care physician and had a CT of the abdomen and pelvis with contrast on 01/09/2017 which showed Extensive upper abdominal adenopathy favoring lymphoma/lymphoproliferative disease, tissue diagnosis recommended. No splenomegaly or focal splenic lesion.  She was referred to [place] for further evaluation and management.  She was here with her mother and husband.  She notes no abdominal, or chest trauma. No fevers chills night sweats or unexpected weight loss. Mild constipation but no other change in bowel habits. No GI bleeding  noted. No change in food intake or swallowing. No urinary discomfort or abnormalities. Currently having no chest pain or shortness of breath. Mild upper abdominal discomfort. No other obvious peripheral lymphadenopathy noted.  INTERVAL HISTORY  Patient is here for f/u of her abdominal LN biopsy results. Of note since the patient last visit, she underwent abdominal lymphnode biopsy on 02/11/2017 performed by Dr. 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 Pt also had a port placed by Dr. Barry Dienes on 02/20/2017.   Patient is here for follow-up to discuss the results of her recent lymphnode biopsy. She notes that she is doing well overall and notes no acute new symptoms. She reports that following her surgery, she now doesn't have any pain. Pt reports that her laparoscopic sites are healing well at this time. Pt notes that she is having residual mildly resolved pain to the site of her port. Pt states that her pain to her port was localized to her port, left sided neck, and left arm. She reports that her pain was alleviated with oxycodone. She states that she was informed by Dr. Barry Dienes that her pathology results appeared to be Hodgkin's Lymphoma and advised the patient to follow up with Dr. [name] for further discussion. She notes that her insurance would like a copy of her pathology results -- this was provided to her.  On review of systems, pt denies fever, chills, weight loss, decreased appetite, decreased energy levels, night sweats.  No abdominal pain or distention.  Pathology results were discussed in detail with the patient. We discussed the diagnosis, natural history, prognosis, staging, uncertainty of definitive treatment option, risk of histologic transformation, adverse risk factors and pros and cons of different treatment options. Patient understands these. We discussed that there is lack of  large clinical trials. NCCN guidelines were discussed with  her. She understands all this and appreciates the explanation.     MEDICAL HISTORY:  Past Medical History:  Diagnosis Date  . Anxiety   . Difficult intravenous access   . Family history of adverse reaction to anesthesia    pt's mother has hx. of post-op N/V and hard to wake up post-op  . History of panic attacks    02/08/17- has not had a panic attacks  . Lymphoma ([place]) 01/2017  . Mesenteric lymphadenopathy 01/2017  . Runny nose 02/19/2017   clear drainage, per pt.  . Seasonal allergies     SURGICAL HISTORY: Past Surgical History:  Procedure Laterality Date  . LYMPH NODE BIOPSY N/A 02/11/2017   Procedure: HAND ASSISTED LAPAROSCOPIC LYMPH NODE BIOPSY;  Surgeon: Stark Klein, MD;  Location: [place];  Service: General;  Laterality: N/A;  . PORTACATH PLACEMENT N/A 02/20/2017   Procedure: INSERTION PORT-A-CATH;  Surgeon: Stark Klein, MD;  Location: [place];  Service: General;  Laterality: N/A;  . TUBAL LIGATION    . UMBILICAL HERNIA REPAIR  02/11/2017    SOCIAL HISTORY: Social History   Social History  . Marital status: Married    Spouse name: N/A  . Number of children: N/A  . Years of education: N/A   Occupational History  . Not on file.   Social History Main Topics  . Smoking status: Never Smoker  . Smokeless tobacco: Never Used  . Alcohol use No  . Drug use: No  . Sexual activity: Yes    Partners: Male    Birth control/ protection: IUD     Comment: tubal ligation   Other Topics Concern  . Not on file   Social History Narrative  . No narrative on file    FAMILY HISTORY: Family History  Problem Relation Age of Onset  . Breast cancer Mother 17  . Fibromyalgia Mother   . Rheum arthritis Mother   . Neuropathy Mother   . Anesthesia problems Mother        post-op N/V; hard to wake up post-op  . Congestive Heart Failure Father        died at age 39  . Cirrhosis Father   . Heart disease Maternal Grandmother   . Diabetes Maternal  Grandmother   . Other Brother        Benign spinal tumor  . Cancer Paternal Grandfather        Leukemia    ALLERGIES:  is allergic to clindamycin/lincomycin.  MEDICATIONS:  Current Outpatient Prescriptions  Medication Sig Dispense Refill  . ketotifen (ZADITOR) 0.025 % ophthalmic solution 1 drop 2 (two) times daily.    Marland Kitchen levonorgestrel (MIRENA) 20 MCG/24HR IUD 1 each by Intrauterine route once.    . methocarbamol (ROBAXIN) 500 MG tablet Take 500 mg by mouth 3 (three) times daily.    . naproxen sodium (ANAPROX) 220 MG tablet Take 220 mg by mouth 2 (two) times daily with a meal.    . ondansetron (ZOFRAN) 4 MG tablet Take 1 tablet (4 mg total) by mouth daily as needed for nausea or vomiting. 15 tablet 1  . oxyCODONE (OXY IR/ROXICODONE) 5 MG immediate release tablet Take 1-2 tablets (5-10 mg total) by mouth every 4 (four) hours as needed for moderate pain. 20 tablet 0  . senna (SENOKOT) 8.6 MG TABS tablet Take 1 tablet (8.6 mg total) by mouth 2 (two) times daily. 30 each 1  . traMADol (ULTRAM) 50 MG  tablet Take 1 tablet (50 mg total) by mouth every 6 (six) hours as needed for severe pain. 30 tablet 0   No current facility-administered medications for this visit.     REVIEW OF SYSTEMS:    10 Point review of Systems was done is negative except as noted above.  PHYSICAL EXAMINATION:  ECOG PERFORMANCE STATUS: 1 - Symptomatic but completely ambulatory  . Vitals:   03/04/17 1426  BP: 108/71  Pulse: 89  Resp: 20  Temp: 98.3 F (36.8 C)  SpO2: 100%   Filed Weights   03/04/17 1426  Weight: 224 lb 6.4 oz (101.8 kg)   .Body mass index is 39.75 kg/m.  GENERAL:alert, in no acute distress and comfortable SKIN: no acute rashes, no significant lesions EYES: conjunctiva are pink and non-injected, sclera anicteric OROPHARYNX: MMM, no exudates, no oropharyngeal erythema or ulceration NECK: supple, no JVD LYMPH:  no palpable lymphadenopathy in the cervical, axillary or inguinal  regions LUNGS: clear to auscultation b/l with normal respiratory effort HEART: regular rate & rhythm ABDOMEN:  Healing surgical scar around the umbilicus. No TTP. No peritoneal signs.  Extremity: no pedal edema PSYCH: alert & oriented x 3 with fluent speech NEURO: no focal motor/sensory deficits  LABORATORY DATA:  I have reviewed the data as listed  . CBC Latest Ref Rng & Units 03/04/2017 02/13/2017 02/12/2017  WBC 3.9 - 10.3 10e3/uL 8.9 7.5 13.9(H)  Hemoglobin 11.6 - 15.9 g/dL 14.1 12.8 13.6  Hematocrit 34.8 - 46.6 % 43.4 40.4 42.1  Platelets 145 - 400 10e3/uL 227 185 228    . CMP Latest Ref Rng & Units 03/04/2017 02/13/2017 02/12/2017  Glucose 70 - 140 mg/dl 87 102(H) 161(H)  BUN 7.0 - 26.0 mg/dL 11.4 9 7   Creatinine 0.6 - 1.1 mg/dL 1.0 1.18(H) 1.09(H)  Sodium 136 - 145 mEq/L 142 138 136  Potassium 3.5 - 5.1 mEq/L 4.0 4.6 4.2  Chloride 101 - 111 mmol/L - 105 104  CO2 22 - 29 mEq/L 27 29 24   Calcium 8.4 - 10.4 mg/dL 9.9 8.9 9.0  Total Protein 6.4 - 8.3 g/dL 7.6 - -  Total Bilirubin 0.20 - 1.20 mg/dL 0.43 - -  Alkaline Phos 40 - 150 U/L 43 - -  AST 5 - 34 U/L 15 - -  ALT 0 - 55 U/L 7 - -   Component     Latest Ref Rng & Units 01/14/2017  LDH     125 - 245 U/L 193  HIV Screen 4th Generation wRfx     Non Reactive Non Reactive  Hep C Virus Ab     0.0 - 0.9 s/co ratio <0.1  hCG,Beta Subunit,Qual,Serum     Negative <6 mIU/mL Negative  Sed Rate     0 - 32 mm/hr 7   Lymphnode biopsy, 02/11/2017 Diagnosis Lymph node for lymphoma, Mesenteric Lymphadenopathy - HODGKIN LYMPHOMA.        RADIOGRAPHIC STUDIES: I have personally reviewed the radiological images as listed and agreed with the findings in the report. Dg Chest Port 1 View  Result Date: 02/20/2017 CLINICAL DATA:  36 year old female with history of left-sided porta cath placement. EXAM: PORTABLE CHEST 1 VIEW COMPARISON:  Chest x-ray 01/06/2017. FINDINGS: New left-sided subclavian single-lumen porta cath with tip  terminating in the right atrium approximately 2 cm distal to the superior cavoatrial junction. Lung volumes are low. No consolidative airspace disease. No pneumothorax. No pleural effusions. Pulmonary venous congestion, without frank pulmonary edema. Mild cardiomegaly. Upper mediastinal contours are within  normal limits. IMPRESSION: 1. New left-sided subclavian single-lumen porta cath with tip terminating in the right atrium. No pneumothorax or other immediate complicating features. 2. Low lung volumes with cardiomegaly and mild pulmonary venous congestion, but no frank pulmonary edema. Electronically Signed   By: Vinnie Langton M.D.   On: 02/20/2017 13:05   Dg Fluoro Guide Cv Line-no Report  Result Date: 02/20/2017 Fluoroscopy was utilized by the requesting physician.  No radiographic interpretation.   NM PET Image Initial (PI) Skull Base To Thigh (Accession [id number]) (Order [id number])  Imaging  Date: 02/01/2017 Department: [facility] Released By: Hilda Lias Authorizing: Brunetta Genera, MD  Exam Information   Status Exam Begun  Exam Ended   Final [99] 02/01/2017 1:34 PM 02/01/2017 3:02 PM  PACS Images   Show images for NM PET Image Initial (PI) Skull Base To Thigh  Study Result   CLINICAL DATA:  Initial treatment strategy for abdominal and thoracic lymphadenopathy on recent CT scans.  EXAM: NUCLEAR MEDICINE PET SKULL BASE TO THIGH  TECHNIQUE: 10.8 mCi F-18 FDG was injected intravenously. Full-ring PET imaging was performed from the skull base to thigh after the radiotracer. CT data was obtained and used for attenuation correction and anatomic localization.  FASTING BLOOD GLUCOSE:  Value: 91 mg/dl  COMPARISON:  CT scans dated 01/09/2017  FINDINGS: NECK  No hypermetabolic lymph nodes in the neck.  CHEST  Right paratracheal, subcarinal, paraesophageal, and para aortic/pleural soft tissue density nodules are identified  in the chest, most of which demonstrate relatively low-grade in activity. A hypermetabolic periaortic lymph node on image 79/4 has a maximum SUV of 4.7 which is fairly representative of the other lymph nodes in the chest.  A smooth oval-shaped right breast lesion measuring 4.0 by 1.8 cm has a maximum SUV of 3.4.  The lungs appear clear.  ABDOMEN/PELVIS  Extensive bulky retroperitoneal, mesenteric, porta hepatis, retrocrural, and peripancreatic adenopathy observed. A mesenteric lymph node on image 130/4 measures 3.5 cm in short axis and has a maximum standard uptake value of 12.1. A lymph node above the pancreatic body measures 3.6 cm in short axis on image 102/4 and has a maximum SUV of 13.2. New  No splenomegaly. No focal splenic or hepatic lesion. The extensive peripancreatic nodal disease makes it difficult to completely exclude a pancreatic lesion although I am skeptical of a pancreatic parenchymal lesion.  Non rotated right kidney. 5.4 by 4.7 cm right right for uterine adnexal cystic lesion appears photopenic. Fairly empty urinary bladder with flattened appearance causing several adjacent collections of urine containing FDG ; this simulates an anterior uterine lesion but there is no CT correlate on either today's exam or the 01/09/2017 exam to suggest that there is a true uterine lesion.  SKELETON  Low-grade diffuse mildly accentuated activity throughout the skeleton but without a focal skeletal lesion identified.  IMPRESSION: 1. Hypermetabolic retroperitoneal, mesenteric, peripancreatic, porta hepatis, and retrocrural adenopathy. 2. Nodularity in the left paraspinal, subcarinal, and paraesophageal regions in the chest as lower but still abnormal metabolic activity, and probably represents adenopathy rather than extramedullary hematopoiesis. There is also a faintly metabolic lesion in the right breast which probably represents the same lesion biopsied in  2016, with benign results. 3. Photopenic cystic right adnexal lesion. An IUD is present in the uterus.   Electronically Signed   By: Van Clines M.D.   On: 02/01/2017 16:10     ASSESSMENT & PLAN:   36 year old African-American female with  #1 Stage  IIIA - Nodular Lymphocyte predominant Hodgkins lymphoma with  Extensive intra-abdominal lymphadenopathy and some thoracic  masses likely lymphadenopathy  Extensive abdominal involvement has been noted to have increase risk of transformation. PET/CT shows fairly active disease. Abdominal disease if progression occurs could potentially cause organ injury. LDH level and sedimentation rate within normal limits . HIV negative  Hepatitis C negative .  Plan -Pathology results were discussed in detail with the patient.  -We discussed the diagnosis, natural history, prognosis, staging, uncertainty of definitive treatment option, risk of histologic transformation, adverse risk factors and pros and cons of different treatment options. -Patient understands these. We discussed that there is lack of large clinical trials. NCCN guidelines were discussed with her. She understands all this and appreciates the explanation.  -As per [place] and guidelines the valid options could be observation versus single agent Rituxan versus combined modality therapy. -I discussed that her extensive and FDG avid abdominal disease and advanced at least stage IV disease with potential for histology transformation and involvement of abdominal organs leads me do not recommend observation. -Single agent Rituxan would probably cause some response but is unlikely to to produce CR with her advanced stage disease. -I recommended we consider R-CHOP x 6 cycles  +/- IFRT  (Fanale et al 2010). -She'll get a baseline echocardiogram in preparation for this -As per patient's request a second opinion shall be obtained from [facility]. The patient has been given a referral  to see Dr Lesly Dukes for a 2nd opinion. -She has already had a port placed. -RTC with Dr. [name] in 2 weeks.   #3 Urinary urgency -previously checked UA./UCx  #3  Patient Active Problem List   Diagnosis Date Noted  . Mesenteric lymphadenopathy 02/11/2017  . Adenopathy 01/10/2017  . 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 Abnormal chest CT 01/07/2017  . History of panic attacks   . Bacterial pharyngitis 07/30/2016   -continue f/u with PCP for other chronic medical issues  Plan:  -Will refer the patient to Hematologist at [facility] for a second opinion.  -Will send a prescription for EMLA  -RTC with Dr. [name] in 2 weeks.     All of the patients questions were answered with apparent satisfaction. The patient knows to call the clinic with any problems, questions or concerns.  I spent 30 minutes counseling the patient face to face. The total time spent in the appointment was 40 minutes and more than 50% was on counseling and direct patient cares.    Sullivan Lone MD [place] AAHIVMS [facility] Hematology/Oncology Physician [facility]  (Office):       [phone] (Work cell):  [phone] (Fax):           [phone]  This document serves as a record of services personally performed by Sullivan Lone, MD. It was created on her behalf by Steva Colder, a trained medical scribe. The creation of this record is based on the scribe's personal observations and the provider's statements to them. This document has been checked and approved by the attending provider.

## 2017-03-04 ENCOUNTER — Ambulatory Visit (HOSPITAL_BASED_OUTPATIENT_CLINIC_OR_DEPARTMENT_OTHER): Payer: 59 | Admitting: Hematology

## 2017-03-04 ENCOUNTER — Other Ambulatory Visit: Payer: 59

## 2017-03-04 ENCOUNTER — Other Ambulatory Visit (HOSPITAL_BASED_OUTPATIENT_CLINIC_OR_DEPARTMENT_OTHER): Payer: 59

## 2017-03-04 ENCOUNTER — Telehealth: Payer: Self-pay

## 2017-03-04 ENCOUNTER — Telehealth: Payer: Self-pay | Admitting: Hematology

## 2017-03-04 ENCOUNTER — Other Ambulatory Visit: Payer: Self-pay

## 2017-03-04 VITALS — BP 108/71 | HR 89 | Temp 98.3°F | Resp 20 | Ht 63.0 in | Wt 224.4 lb

## 2017-03-04 DIAGNOSIS — C8108 Nodular lymphocyte predominant Hodgkin lymphoma, lymph nodes of multiple sites: Secondary | ICD-10-CM

## 2017-03-04 DIAGNOSIS — R222 Localized swelling, mass and lump, trunk: Secondary | ICD-10-CM

## 2017-03-04 DIAGNOSIS — R590 Localized enlarged lymph nodes: Secondary | ICD-10-CM

## 2017-03-04 DIAGNOSIS — R3915 Urgency of urination: Secondary | ICD-10-CM

## 2017-03-04 LAB — CBC WITH DIFFERENTIAL/PLATELET
BASO%: 0.3 % (ref 0.0–2.0)
BASOS ABS: 0 10*3/uL (ref 0.0–0.1)
EOS ABS: 0.4 10*3/uL (ref 0.0–0.5)
EOS%: 4.9 % (ref 0.0–7.0)
HEMATOCRIT: 43.4 % (ref 34.8–46.6)
HGB: 14.1 g/dL (ref 11.6–15.9)
LYMPH#: 3.2 10*3/uL (ref 0.9–3.3)
LYMPH%: 35.7 % (ref 14.0–49.7)
MCH: 26 pg (ref 25.1–34.0)
MCHC: 32.5 g/dL (ref 31.5–36.0)
MCV: 80.1 fL (ref 79.5–101.0)
MONO#: 0.8 10*3/uL (ref 0.1–0.9)
MONO%: 8.4 % (ref 0.0–14.0)
NEUT%: 50.7 % (ref 38.4–76.8)
NEUTROS ABS: 4.5 10*3/uL (ref 1.5–6.5)
PLATELETS: 227 10*3/uL (ref 145–400)
RBC: 5.42 10*6/uL (ref 3.70–5.45)
RDW: 13.8 % (ref 11.2–14.5)
WBC: 8.9 10*3/uL (ref 3.9–10.3)

## 2017-03-04 LAB — COMPREHENSIVE METABOLIC PANEL
ALT: 7 U/L (ref 0–55)
ANION GAP: 9 meq/L (ref 3–11)
AST: 15 U/L (ref 5–34)
Albumin: 4 g/dL (ref 3.5–5.0)
Alkaline Phosphatase: 43 U/L (ref 40–150)
BUN: 11.4 mg/dL (ref 7.0–26.0)
CALCIUM: 9.9 mg/dL (ref 8.4–10.4)
CHLORIDE: 107 meq/L (ref 98–109)
CO2: 27 meq/L (ref 22–29)
Creatinine: 1 mg/dL (ref 0.6–1.1)
EGFR: 81 mL/min/{1.73_m2} — ABNORMAL LOW (ref >90)
Glucose: 87 mg/dl (ref 70–140)
POTASSIUM: 4 meq/L (ref 3.5–5.1)
Sodium: 142 mEq/L (ref 136–145)
Total Bilirubin: 0.43 mg/dL (ref 0.20–1.20)
Total Protein: 7.6 g/dL (ref 6.4–8.3)

## 2017-03-04 LAB — CULTURE, FUNGUS WITHOUT SMEAR

## 2017-03-04 LAB — LACTATE DEHYDROGENASE: LDH: 185 U/L (ref 125–245)

## 2017-03-04 MED ORDER — OXYCODONE HCL 5 MG PO TABS: 5.0000 mg | ORAL_TABLET | ORAL | 0 refills | Status: DC | PRN

## 2017-03-04 MED ORDER — LIDOCAINE-PRILOCAINE 2.5-2.5 % EX CREA: 1.0000 "application " | TOPICAL_CREAM | CUTANEOUS | 2 refills | Status: DC | PRN

## 2017-03-04 NOTE — Telephone Encounter (Signed)
Gave avs and calendar for september °

## 2017-03-04 NOTE — Telephone Encounter (Signed)
Message sent notifying of patient need to be referred to [facility] for a second opinion with Dr. Duanne Moron regarding Hodgkins Lymphoma.

## 2017-03-05 ENCOUNTER — Telehealth: Payer: Self-pay | Admitting: Hematology

## 2017-03-05 LAB — SEDIMENTATION RATE: Sedimentation Rate-Westergren: 2 mm/hr (ref 0–32)

## 2017-03-05 NOTE — Telephone Encounter (Signed)
Pt referred to Dr. Leonie Man at [facility] for 2nd opinion. Spoke w/ Penny Pia at [facility] and set up appt fpr October 4th at 9:30am. Called patient to advise of this

## 2017-03-13 ENCOUNTER — Ambulatory Visit (HOSPITAL_COMMUNITY)
Admission: RE | Admit: 2017-03-13 | Discharge: 2017-03-13 | Disposition: A | Discharge status: Home / Self Care | Payer: 59 | Source: Ambulatory Visit | Attending: Hematology | Admitting: Hematology

## 2017-03-13 DIAGNOSIS — C8108 Nodular lymphocyte predominant Hodgkin lymphoma, lymph nodes of multiple sites: Secondary | ICD-10-CM | POA: Diagnosis not present

## 2017-03-13 LAB — ECHOCARDIOGRAM COMPLETE
E decel time: 127 msec
E/e' ratio: 7.16
FS: 34 % (ref 28–44)
IV/PV OW: 1.14
LA diam end sys: 28 mm
LA diam index: 1.38 cm/m2
LA vol A4C: 43.2 ml
LASIZE: 28 mm
LV E/e'average: 7.16
LVEEMED: 7.16
LVELAT: 14.1 cm/s
LVOT SV: 55 mL
LVOT VTI: 19.2 cm
LVOT area: 2.84 cm2
LVOT diameter: 19 mm
LVOT peak vel: 91.8 cm/s
Lateral S' vel: 13.1 cm/s
MV Dec: 127
MVPG: 4 mmHg
MVPKAVEL: 60.1 m/s
MVPKEVEL: 101 m/s
PW: 8.38 mm — AB (ref 0.6–1.1)
RV TAPSE: 24.8 mm
TDI e' lateral: 14.1
TDI e' medial: 10.9

## 2017-03-13 NOTE — Progress Notes (Signed)
  Echocardiogram 2D Echocardiogram has been performed.  Bonnie Larsen L Androw 03/13/2017, 10:27 AM

## 2017-03-15 NOTE — Progress Notes (Signed)
HEMATOLOGY/ONCOLOGY CLINIC NOTE  Date of Service: 03/18/2017   Patient Care Team: Girtha Rm, NP-C as PCP - General (Family Medicine)  CHIEF COMPLAINTS/PURPOSE OF CONSULTATION:  Thoracic and abdominal lymphadenopathy concerning for lymphoproliferative disorder  HISTORY OF PRESENTING ILLNESS:  Bonnie Larsen is a wonderful 36 y.o. female who has been referred to [place] by Dr .Raenette Rover, Laurian Brim, NP-C for evaluation and management of thoracic paraspinal masses and upper abdominal extensive lymphadenopathy.  Patient has a history of uterine fibroids, right breast benign lesion biopsied in 2016, anxiety/panic attacks, migraine headaches and chronic sinus issues who presented to the emergency room brought by EMS for chest pain and shortness of breath about 7-10 days ago. She was noted to be hypertensive with a blood pressure of 180/110 which improved with nitroglycerin. She was also having some back pain and felt like her chest pain was radiating to the back. D-dimer level was borderline elevated. She had a CTA of the chest on 01/06/2017 that showed no evidence of pulmonary embolism. She was however noted to have Multiple prevertebral paraspinal masses, differential diagnosis includes extramedullary hematopoiesis and metastasis/lymphadenopathy. Recommend correlation for laboratory analysis for anemia and lymphoproliferative disease.  Patient was subsequently seen by her primary care physician and had a CT of the abdomen and pelvis with contrast on 01/09/2017 which showed Extensive upper abdominal adenopathy favoring lymphoma/lymphoproliferative disease, tissue diagnosis recommended. No splenomegaly or focal splenic lesion.  She was referred to [place] for further evaluation and management.  She was here with her mother and husband.  She notes no abdominal, or chest trauma. No fevers chills night sweats or unexpected weight loss. Mild constipation but no other change in bowel habits. No GI bleeding  noted. No change in food intake or swallowing. No urinary discomfort or abnormalities. Currently having no chest pain or shortness of breath. Mild upper abdominal discomfort. No other obvious peripheral lymphadenopathy noted.   CURRENT THERAPY:    Planned treatment - R-CHOP x 6 cycles  +/- IFRT  (Fanale et al 2010)   INTERVAL HISTORY Bonnie Larsen is here for f/u of her abdominal LN biopsy results. Of note since the patient last visit, she underwent abdominal lymphnode biopsy on 02/11/2017 performed by Dr. 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 Pt also had a port placed by Dr. Barry Dienes on 02/20/2017.   She presents to the clinic today with her mother. She notes a sharp shooting pain that occurs from her lower back and moves to across lower abdomen that throbs and that radiates to her legs and aches. She has tenderness in the lower abdomen and legs. The back of her legs feel cramped and the height of the pain feels like it is in the bone. She took Tramadol and that did not elevate her pain. This has occurred on and off for the last 2 weeks. She denies change in urination and she denies history of kidney stones. She drinks more water and has regular BM. She denies stomach cramps. She notes to previously having a rash around her right neck and shoulder that has now dried up. She has not noticed any new changes in her lymph nodes. She denies leg swelling, nausea and vomiting. In the past she has had an adnexal cyst that pushes on her bladder but has not had symptoms to this magnitude before. She reports her appetite and eating is good and so is her overall energy.   She had a ECHO 6 days ago  and shows normal function. She will see Dr. Duanne Moron in another 10 days.  Her CBC and CMP shows no significant change from 2 weeks ago.   We discussed her case  in tumor board and the recommendation were to pursue chemo-immunotherapy with R-CHOP. Patient after  extensive discussion would like to proceed with treatment. Counseled about the R-CHOP chemotherapy and she is agreeable to proceed.   MEDICAL HISTORY:  Past Medical History:  Diagnosis Date  . Anxiety   . Difficult intravenous access   . Family history of adverse reaction to anesthesia    pt's mother has hx. of post-op N/V and hard to wake up post-op  . History of panic attacks    02/08/17- has not had a panic attacks  . Lymphoma ([place]) 01/2017  . Mesenteric lymphadenopathy 01/2017  . Runny nose 02/19/2017   clear drainage, per pt.  . Seasonal allergies     SURGICAL HISTORY: Past Surgical History:  Procedure Laterality Date  . LYMPH NODE BIOPSY N/A 02/11/2017   Procedure: HAND ASSISTED LAPAROSCOPIC LYMPH NODE BIOPSY;  Surgeon: Stark Klein, MD;  Location: [place];  Service: General;  Laterality: N/A;  . PORTACATH PLACEMENT N/A 02/20/2017   Procedure: INSERTION PORT-A-CATH;  Surgeon: Stark Klein, MD;  Location: [place];  Service: General;  Laterality: N/A;  . TUBAL LIGATION    . UMBILICAL HERNIA REPAIR  02/11/2017    SOCIAL HISTORY: Social History   Social History  . Marital status: Married    Spouse name: N/A  . Number of children: N/A  . Years of education: N/A   Occupational History  . Not on file.   Social History Main Topics  . Smoking status: Never Smoker  . Smokeless tobacco: Never Used  . Alcohol use No  . Drug use: No  . Sexual activity: Yes    Partners: Male    Birth control/ protection: IUD     Comment: tubal ligation   Other Topics Concern  . Not on file   Social History Narrative  . No narrative on file    FAMILY HISTORY: Family History  Problem Relation Age of Onset  . Breast cancer Mother 98  . Fibromyalgia Mother   . Rheum arthritis Mother   . Neuropathy Mother   . Anesthesia problems Mother        post-op N/V; hard to wake up post-op  . Congestive Heart Failure Father        died at age 27  . Cirrhosis Father   .  Heart disease Maternal Grandmother   . Diabetes Maternal Grandmother   . Other Brother        Benign spinal tumor  . Cancer Paternal Grandfather        Leukemia    ALLERGIES:  is allergic to clindamycin/lincomycin.  MEDICATIONS:  Current Outpatient Prescriptions  Medication Sig Dispense Refill  . ketotifen (ZADITOR) 0.025 % ophthalmic solution 1 drop 2 (two) times daily.    Marland Kitchen levonorgestrel (MIRENA) 20 MCG/24HR IUD 1 each by Intrauterine route once.    . lidocaine-prilocaine (EMLA) cream Apply 1 application topically as needed. 30 g 2  . methocarbamol (ROBAXIN) 500 MG tablet Take 500 mg by mouth 3 (three) times daily.    . naproxen sodium (ANAPROX) 220 MG tablet Take 220 mg by mouth 2 (two) times daily with a meal.    . ondansetron (ZOFRAN) 4 MG tablet Take 1 tablet (4 mg total) by mouth daily as needed for nausea or vomiting. 15  tablet 1  . oxyCODONE (OXY IR/ROXICODONE) 5 MG immediate release tablet Take 1-2 tablets (5-10 mg total) by mouth every 4 (four) hours as needed for moderate pain. 30 tablet 0  . senna (SENOKOT) 8.6 MG TABS tablet Take 1 tablet (8.6 mg total) by mouth 2 (two) times daily. 30 each 1  . traMADol (ULTRAM) 50 MG tablet Take 1 tablet (50 mg total) by mouth every 6 (six) hours as needed for severe pain. 30 tablet 0   No current facility-administered medications for this visit.     REVIEW OF SYSTEMS:    10 Point review of Systems was done is negative except as noted above.  PHYSICAL EXAMINATION:  ECOG PERFORMANCE STATUS: 1 - Symptomatic but completely ambulatory  . Vitals:   03/18/17 1147  BP: 135/86  Pulse: 84  Resp: 17  Temp: 99 F (37.2 C)  SpO2: 97%   Filed Weights   03/18/17 1147  Weight: 224 lb 3.2 oz (101.7 kg)   .Body mass index is 39.72 kg/m.  GENERAL:alert, in no acute distress and comfortable SKIN: no acute rashes, no significant lesions EYES: conjunctiva are pink and non-injected, sclera anicteric OROPHARYNX: MMM, no exudates, no  oropharyngeal erythema or ulceration NECK: supple, no JVD LYMPH:  no palpable lymphadenopathy in the cervical, axillary or inguinal regions LUNGS: clear to auscultation b/l with normal respiratory effort HEART: regular rate & rhythm ABDOMEN:  Healing surgical scar around the umbilicus. No TTP. No peritoneal signs. Some deep TTP mid/upper abd. Extremity: no pedal edema PSYCH: alert & oriented x 3 with fluent speech NEURO: no focal motor/sensory deficits  LABORATORY DATA:  I have reviewed the data as listed  . CBC Latest Ref Rng & Units 03/18/2017 03/04/2017 02/13/2017  WBC 3.9 - 10.3 10e3/uL 5.9 8.9 7.5  Hemoglobin 11.6 - 15.9 g/dL 14.1 14.1 12.8  Hematocrit 34.8 - 46.6 % 43.8 43.4 40.4  Platelets 145 - 400 10e3/uL 193 227 185    . CMP Latest Ref Rng & Units 03/18/2017 03/04/2017 02/13/2017  Glucose 70 - 140 mg/dl 90 87 102(H)  BUN 7.0 - 26.0 mg/dL 9.5 11.4 9  Creatinine 0.6 - 1.1 mg/dL 1.0 1.0 1.18(H)  Sodium 136 - 145 mEq/L 139 142 138  Potassium 3.5 - 5.1 mEq/L 4.2 4.0 4.6  Chloride 101 - 111 mmol/L - - 105  CO2 22 - 29 mEq/L 27 27 29   Calcium 8.4 - 10.4 mg/dL 9.6 9.9 8.9  Total Protein 6.4 - 8.3 g/dL 7.4 7.6 -  Total Bilirubin 0.20 - 1.20 mg/dL 0.61 0.43 -  Alkaline Phos 40 - 150 U/L 40 43 -  AST 5 - 34 U/L 15 15 -  ALT 0-55 U/L U/L <6 7 -   Component     Latest Ref Rng & Units 01/14/2017  LDH     125 - 245 U/L 193  HIV Screen 4th Generation wRfx     Non Reactive Non Reactive  Hep C Virus Ab     0.0 - 0.9 s/co ratio <0.1  hCG,Beta Subunit,Qual,Serum     Negative <6 mIU/mL Negative  Sed Rate     0 - 32 mm/hr 7   Lymphnode biopsy, 02/11/2017 Diagnosis Lymph node for lymphoma, Mesenteric Lymphadenopathy - HODGKIN LYMPHOMA.         PROCEDURES  ECHO 03/13/17 Study Conclusions - Left ventricle: The cavity size was normal. Systolic function was   normal. The estimated ejection fraction was in the range of 55%   to 60%. Wall motion  was normal; there were no  regional wall   motion abnormalities. Left ventricular diastolic function   parameters were normal. - Atrial septum: No defect or patent foramen ovale was identified. - Impressions: Normal GLS -20.2.   RADIOGRAPHIC STUDIES: I have personally reviewed the radiological images as listed and agreed with the findings in the report. Dg Chest Port 1 View  Result Date: 02/20/2017 CLINICAL DATA:  36 year old female with history of left-sided porta cath placement. EXAM: PORTABLE CHEST 1 VIEW COMPARISON:  Chest x-ray 01/06/2017. FINDINGS: New left-sided subclavian single-lumen porta cath with tip terminating in the right atrium approximately 2 cm distal to the superior cavoatrial junction. Lung volumes are low. No consolidative airspace disease. No pneumothorax. No pleural effusions. Pulmonary venous congestion, without frank pulmonary edema. Mild cardiomegaly. Upper mediastinal contours are within normal limits. IMPRESSION: 1. New left-sided subclavian single-lumen porta cath with tip terminating in the right atrium. No pneumothorax or other immediate complicating features. 2. Low lung volumes with cardiomegaly and mild pulmonary venous congestion, but no frank pulmonary edema. Electronically Signed   By: Vinnie Langton M.D.   On: 02/20/2017 13:05   Dg Fluoro Guide Cv Line-no Report  Result Date: 02/20/2017 Fluoroscopy was utilized by the requesting physician.  No radiographic interpretation.   NM PET Image Initial (PI) Skull Base To Thigh (Accession [id number]) (Order [id number])  Imaging  Date: 02/01/2017 Department: [facility] Released By: Hilda Lias Authorizing: Brunetta Genera, MD  Exam Information   Status Exam Begun  Exam Ended   Final [99] 02/01/2017 1:34 PM 02/01/2017 3:02 PM  PACS Images   Show images for NM PET Image Initial (PI) Skull Base To Thigh  Study Result   CLINICAL DATA:  Initial treatment strategy for abdominal and thoracic  lymphadenopathy on recent CT scans.  EXAM: NUCLEAR MEDICINE PET SKULL BASE TO THIGH  TECHNIQUE: 10.8 mCi F-18 FDG was injected intravenously. Full-ring PET imaging was performed from the skull base to thigh after the radiotracer. CT data was obtained and used for attenuation correction and anatomic localization.  FASTING BLOOD GLUCOSE:  Value: 91 mg/dl  COMPARISON:  CT scans dated 01/09/2017  FINDINGS: NECK  No hypermetabolic lymph nodes in the neck.  CHEST  Right paratracheal, subcarinal, paraesophageal, and para aortic/pleural soft tissue density nodules are identified in the chest, most of which demonstrate relatively low-grade in activity. A hypermetabolic periaortic lymph node on image 79/4 has a maximum SUV of 4.7 which is fairly representative of the other lymph nodes in the chest.  A smooth oval-shaped right breast lesion measuring 4.0 by 1.8 cm has a maximum SUV of 3.4.  The lungs appear clear.  ABDOMEN/PELVIS  Extensive bulky retroperitoneal, mesenteric, porta hepatis, retrocrural, and peripancreatic adenopathy observed. A mesenteric lymph node on image 130/4 measures 3.5 cm in short axis and has a maximum standard uptake value of 12.1. A lymph node above the pancreatic body measures 3.6 cm in short axis on image 102/4 and has a maximum SUV of 13.2. New  No splenomegaly. No focal splenic or hepatic lesion. The extensive peripancreatic nodal disease makes it difficult to completely exclude a pancreatic lesion although I am skeptical of a pancreatic parenchymal lesion.  Non rotated right kidney. 5.4 by 4.7 cm right right for uterine adnexal cystic lesion appears photopenic. Fairly empty urinary bladder with flattened appearance causing several adjacent collections of urine containing FDG ; this simulates an anterior uterine lesion but there is no CT correlate on either today's exam or  the 01/09/2017 exam to suggest that there is a true  uterine lesion.  SKELETON  Low-grade diffuse mildly accentuated activity throughout the skeleton but without a focal skeletal lesion identified.  IMPRESSION: 1. Hypermetabolic retroperitoneal, mesenteric, peripancreatic, porta hepatis, and retrocrural adenopathy. 2. Nodularity in the left paraspinal, subcarinal, and paraesophageal regions in the chest as lower but still abnormal metabolic activity, and probably represents adenopathy rather than extramedullary hematopoiesis. There is also a faintly metabolic lesion in the right breast which probably represents the same lesion biopsied in 2016, with benign results. 3. Photopenic cystic right adnexal lesion. An IUD is present in the uterus.   Electronically Signed   By: Van Clines M.D.   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 -PET/CT shows fairly active disease. Abdominal disease if progression occurs could potentially cause organ injury. -LDH level and sedimentation rate within normal limits . -HIV negative  -Hepatitis C negative. -Pathology results were discussed in detail with the patient.  -We previously discussed the diagnosis, natural history, prognosis, staging, uncertainty of definitive treatment option, risk of histologic transformation, adverse risk factors and pros and cons of different treatment options. -Patient understands these. We discussed that there is lack of large clinical trials. NCCN guidelines were discussed with her. She understands all this and appreciates the explanation.  -As per NCCN guidelines the valid options could be observation versus single agent Rituxan versus combined modality  therapy. -I previously discussed that her extensive and FDG avid abdominal disease and advanced at least stage III disease with potential for histology transformation and involvement of abdominal organs leads me to not recommend observation. -Single agent Rituxan would probably cause some response but is unlikely due to produce CR with her advanced stage disease. -Baseline ECHO from 03/13/17 shows normal heart function, EF 55%-60% Plan -after discussion in the tumor board I recommended we consider R-CHOP x 6 cycles  +/- IFRT  (Fanale et al 2010). Patient agrees to start treatment later this week and discussing the pros and cons. -As per patient's request a second opinion shall be obtained from [facility]. The patient has been given a referral to see Dr Lesly Dukes for a 2nd opinion on 03/28/17 -she was prescribed Norco prn for pain control. -I will prescribe Prednisone to help with her back pain. If her pain worseness or persists I suggest an abdominal CT scan     #2 Urinary urgency -checked UA  #3  Patient Active Problem List   Diagnosis Date Noted  . Mesenteric lymphadenopathy 02/11/2017  . Adenopathy 01/10/2017  . 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 Abnormal chest CT 01/07/2017  . History of panic attacks   . Bacterial pharyngitis 07/30/2016  -continue f/u with PCP for other chronic medical issues  #4 anxiety -Pt requests medication to help her anxiety through chemo PLAN:  -I will order her Ativan to help   -RTC with Dr. [name] in 2 weeks.  -Chemo class this week before  -Will send a prescription for Prednisone to help take pressure off her back - prescription for Ativan for her anxiety with treatment and Norco for her pain.  -R-CHOp in 5-7 days RTC with Dr [name] in 7-10 days post chemotherapy for toxicity check.    All of the  patients questions were answered with apparent satisfaction. The patient knows to call the clinic with any problems, questions or concerns.  I  spent 30 minutes counseling the patient face to face. The total time spent in the appointment was 40 minutes and more than 50% was on counseling and direct patient cares.    Sullivan Lone MD [place] AAHIVMS [facility] Hematology/Oncology Physician [facility]  (Office):       [phone] (Work cell):  [phone] (Fax):           [phone]  This document serves as a record of services personally performed by Sullivan Lone, MD. It was created on her behalf by Joslyn Devon, a trained medical scribe. The creation of this record is based on the scribe's personal observations and the provider's statements to them. This document has been checked and approved by the attending provider.

## 2017-03-18 ENCOUNTER — Ambulatory Visit (HOSPITAL_BASED_OUTPATIENT_CLINIC_OR_DEPARTMENT_OTHER): Payer: 59 | Admitting: Hematology

## 2017-03-18 ENCOUNTER — Ambulatory Visit: Payer: 59

## 2017-03-18 ENCOUNTER — Other Ambulatory Visit (HOSPITAL_BASED_OUTPATIENT_CLINIC_OR_DEPARTMENT_OTHER): Payer: 59

## 2017-03-18 ENCOUNTER — Other Ambulatory Visit: Payer: Self-pay

## 2017-03-18 ENCOUNTER — Encounter: Payer: Self-pay | Admitting: Hematology

## 2017-03-18 ENCOUNTER — Telehealth: Payer: Self-pay | Admitting: Hematology

## 2017-03-18 VITALS — BP 135/86 | HR 84 | Temp 99.0°F | Resp 17 | Ht 63.0 in | Wt 224.2 lb

## 2017-03-18 DIAGNOSIS — R590 Localized enlarged lymph nodes: Secondary | ICD-10-CM

## 2017-03-18 DIAGNOSIS — R3915 Urgency of urination: Secondary | ICD-10-CM

## 2017-03-18 DIAGNOSIS — M5442 Lumbago with sciatica, left side: Secondary | ICD-10-CM

## 2017-03-18 DIAGNOSIS — F419 Anxiety disorder, unspecified: Secondary | ICD-10-CM

## 2017-03-18 DIAGNOSIS — M545 Low back pain: Secondary | ICD-10-CM

## 2017-03-18 DIAGNOSIS — C8108 Nodular lymphocyte predominant Hodgkin lymphoma, lymph nodes of multiple sites: Secondary | ICD-10-CM

## 2017-03-18 DIAGNOSIS — M5441 Lumbago with sciatica, right side: Secondary | ICD-10-CM

## 2017-03-18 LAB — URINALYSIS, MICROSCOPIC - CHCC
Bilirubin (Urine): NEGATIVE
Blood: NEGATIVE
Glucose: NEGATIVE mg/dL
Ketones: 5 mg/dL
LEUKOCYTE ESTERASE: NEGATIVE
NITRITE: NEGATIVE
PROTEIN: NEGATIVE mg/dL
SPECIFIC GRAVITY, URINE: 1.025 (ref 1.003–1.035)
UROBILINOGEN UR: 0.2 mg/dL (ref 0.2–1)
pH: 6 (ref 4.6–8.0)

## 2017-03-18 LAB — COMPREHENSIVE METABOLIC PANEL
ALBUMIN: 4 g/dL (ref 3.5–5.0)
ALT: <6 U/L (ref 0–55)
AST: 15 U/L (ref 5–34)
Alkaline Phosphatase: 40 U/L (ref 40–150)
Anion Gap: 8 mEq/L (ref 3–11)
BUN: 9.5 mg/dL (ref 7.0–26.0)
CHLORIDE: 104 meq/L (ref 98–109)
CO2: 27 meq/L (ref 22–29)
Calcium: 9.6 mg/dL (ref 8.4–10.4)
Creatinine: 1 mg/dL (ref 0.6–1.1)
EGFR: 88 mL/min/{1.73_m2} — AB (ref >90)
GLUCOSE: 90 mg/dL (ref 70–140)
POTASSIUM: 4.2 meq/L (ref 3.5–5.1)
SODIUM: 139 meq/L (ref 136–145)
TOTAL PROTEIN: 7.4 g/dL (ref 6.4–8.3)
Total Bilirubin: 0.61 mg/dL (ref 0.20–1.20)

## 2017-03-18 LAB — CBC & DIFF AND RETIC
BASO%: 0.7 % (ref 0.0–2.0)
Basophils Absolute: 0 10*3/uL (ref 0.0–0.1)
EOS ABS: 0.7 10*3/uL — AB (ref 0.0–0.5)
EOS%: 11.1 % — ABNORMAL HIGH (ref 0.0–7.0)
HCT: 43.8 % (ref 34.8–46.6)
HEMOGLOBIN: 14.1 g/dL (ref 11.6–15.9)
IMMATURE RETIC FRACT: 1.3 % — AB (ref 1.60–10.00)
LYMPH#: 2.9 10*3/uL (ref 0.9–3.3)
LYMPH%: 49.2 % (ref 14.0–49.7)
MCH: 25.7 pg (ref 25.1–34.0)
MCHC: 32.2 g/dL (ref 31.5–36.0)
MCV: 79.8 fL (ref 79.5–101.0)
MONO#: 0.6 10*3/uL (ref 0.1–0.9)
MONO%: 10.3 % (ref 0.0–14.0)
NEUT%: 28.7 % — ABNORMAL LOW (ref 38.4–76.8)
NEUTROS ABS: 1.7 10*3/uL (ref 1.5–6.5)
Platelets: 193 10*3/uL (ref 145–400)
RBC: 5.49 10*6/uL — AB (ref 3.70–5.45)
RDW: 13.8 % (ref 11.2–14.5)
RETIC %: 1.17 % (ref 0.70–2.10)
RETIC CT ABS: 64.23 10*3/uL (ref 33.70–90.70)
WBC: 5.9 10*3/uL (ref 3.9–10.3)

## 2017-03-18 LAB — LACTATE DEHYDROGENASE: LDH: 191 U/L (ref 125–245)

## 2017-03-18 MED ORDER — LORAZEPAM 0.5 MG PO TABS: 0.5000 mg | ORAL_TABLET | Freq: Three times a day (TID) | ORAL | 0 refills | Status: DC | PRN

## 2017-03-18 MED ORDER — HYDROCODONE-ACETAMINOPHEN 7.5-325 MG PO TABS: 1.0000 | ORAL_TABLET | Freq: Four times a day (QID) | ORAL | 0 refills | Status: DC | PRN

## 2017-03-18 MED ORDER — PREDNISONE 20 MG PO TABS: 40.0000 mg | ORAL_TABLET | Freq: Every day | ORAL | 0 refills | Status: DC

## 2017-03-18 NOTE — Telephone Encounter (Signed)
Gave avs and calendar for October  °

## 2017-03-19 DIAGNOSIS — C8108 Nodular lymphocyte predominant Hodgkin lymphoma, lymph nodes of multiple sites: Secondary | ICD-10-CM | POA: Insufficient documentation

## 2017-03-19 LAB — SEDIMENTATION RATE: Sedimentation Rate-Westergren: 5 mm/hr (ref 0–32)

## 2017-03-19 MED ORDER — ALLOPURINOL 100 MG PO TABS: 100.0000 mg | ORAL_TABLET | Freq: Two times a day (BID) | ORAL | 0 refills | Status: DC

## 2017-03-19 MED ORDER — PREDNISONE 20 MG PO TABS: 80.0000 mg | ORAL_TABLET | Freq: Every day | ORAL | 6 refills | Status: DC

## 2017-03-19 MED ORDER — ONDANSETRON HCL 8 MG PO TABS
8.0000 mg | ORAL_TABLET | Freq: Two times a day (BID) | ORAL | 1 refills | Status: DC | PRN
Start: 2017-03-19 — End: 2017-08-14

## 2017-03-19 MED ORDER — PROCHLORPERAZINE MALEATE 10 MG PO TABS: 10.0000 mg | ORAL_TABLET | Freq: Four times a day (QID) | ORAL | 6 refills | Status: DC | PRN

## 2017-03-19 NOTE — Progress Notes (Signed)
START ON PATHWAY REGIMEN - Lymphoma and CLL     A cycle is every 21 days:     Rituximab      Cyclophosphamide      Doxorubicin      Vincristine      Prednisone   **Always confirm dose/schedule in your pharmacy ordering system**    Patient Characteristics: Nodular Lymphocyte Predominant Hodgkin Lymphoma, First Line, Stage III / IV Disease Type: Nodular Lymphocyte Predominant Hodgkin Lymphoma Disease Type: Not Applicable Line of therapy: First Line [place] Stage: IIIA Intent of Therapy: Curative Intent, Discussed with Patient

## 2017-03-20 ENCOUNTER — Telehealth: Payer: Self-pay

## 2017-03-20 ENCOUNTER — Other Ambulatory Visit: Payer: Self-pay

## 2017-03-20 DIAGNOSIS — C8108 Nodular lymphocyte predominant Hodgkin lymphoma, lymph nodes of multiple sites: Secondary | ICD-10-CM

## 2017-03-20 NOTE — Telephone Encounter (Signed)
Dr. [name] requesting f/u with pt 7-10 days post treatment. Only availability for MD on 10/9 at 1220. Okay to use slot per MD. Labs scheduled for 1200 and doctor visit at 1220. Left VM with pt. Instructed her to call back is this will not work for her. Contact [phone].

## 2017-03-25 ENCOUNTER — Other Ambulatory Visit: Payer: 59

## 2017-03-25 ENCOUNTER — Telehealth: Payer: Self-pay | Admitting: Hematology

## 2017-03-25 NOTE — Telephone Encounter (Signed)
Faxed records to [facility] [phone]

## 2017-03-26 ENCOUNTER — Ambulatory Visit (HOSPITAL_BASED_OUTPATIENT_CLINIC_OR_DEPARTMENT_OTHER): Payer: 59

## 2017-03-26 ENCOUNTER — Ambulatory Visit (HOSPITAL_BASED_OUTPATIENT_CLINIC_OR_DEPARTMENT_OTHER): Payer: 59 | Admitting: Medical

## 2017-03-26 ENCOUNTER — Other Ambulatory Visit: Payer: Self-pay | Admitting: Hematology

## 2017-03-26 VITALS — BP 123/73 | HR 88 | Temp 97.9°F | Resp 16

## 2017-03-26 DIAGNOSIS — R197 Diarrhea, unspecified: Secondary | ICD-10-CM

## 2017-03-26 DIAGNOSIS — C8108 Nodular lymphocyte predominant Hodgkin lymphoma, lymph nodes of multiple sites: Secondary | ICD-10-CM

## 2017-03-26 DIAGNOSIS — R6889 Other general symptoms and signs: Secondary | ICD-10-CM

## 2017-03-26 DIAGNOSIS — Z5111 Encounter for antineoplastic chemotherapy: Secondary | ICD-10-CM | POA: Diagnosis not present

## 2017-03-26 DIAGNOSIS — R070 Pain in throat: Secondary | ICD-10-CM | POA: Diagnosis not present

## 2017-03-26 DIAGNOSIS — F419 Anxiety disorder, unspecified: Secondary | ICD-10-CM | POA: Diagnosis not present

## 2017-03-26 DIAGNOSIS — Z5112 Encounter for antineoplastic immunotherapy: Secondary | ICD-10-CM

## 2017-03-26 DIAGNOSIS — R450 Nervousness: Secondary | ICD-10-CM | POA: Diagnosis not present

## 2017-03-26 DIAGNOSIS — R202 Paresthesia of skin: Secondary | ICD-10-CM | POA: Diagnosis not present

## 2017-03-26 LAB — ACID FAST CULTURE WITH REFLEXED SENSITIVITIES: ACID FAST CULTURE - AFSCU3: NEGATIVE

## 2017-03-26 MED ORDER — DIPHENHYDRAMINE HCL 25 MG PO CAPS
ORAL_CAPSULE | ORAL | Status: AC
  Filled 2017-03-26: qty 2

## 2017-03-26 MED ORDER — METHYLPREDNISOLONE SODIUM SUCC 125 MG IJ SOLR
125.0000 mg | Freq: Once | INTRAMUSCULAR | Status: AC | PRN
  Administered 2017-03-26: 125 mg via INTRAVENOUS

## 2017-03-26 MED ORDER — DIPHENHYDRAMINE HCL 25 MG PO CAPS
50.0000 mg | ORAL_CAPSULE | Freq: Once | ORAL | Status: AC
  Administered 2017-03-26: 50 mg via ORAL

## 2017-03-26 MED ORDER — LORAZEPAM 2 MG/ML IJ SOLN
INTRAMUSCULAR | Status: AC
  Filled 2017-03-26: qty 1

## 2017-03-26 MED ORDER — DIPHENHYDRAMINE HCL 25 MG PO TABS
50.0000 mg | ORAL_TABLET | Freq: Once | ORAL | Status: AC
  Administered 2017-03-26: 50 mg via ORAL
  Filled 2017-03-26: qty 2

## 2017-03-26 MED ORDER — LORAZEPAM 2 MG/ML IJ SOLN
0.5000 mg | Freq: Once | INTRAMUSCULAR | Status: AC
  Administered 2017-03-26: 0.5 mg via INTRAVENOUS

## 2017-03-26 MED ORDER — FAMOTIDINE IN NACL 20-0.9 MG/50ML-% IV SOLN
20.0000 mg | Freq: Once | INTRAVENOUS | Status: AC | PRN
  Administered 2017-03-26: 20 mg via INTRAVENOUS

## 2017-03-26 MED ORDER — SODIUM CHLORIDE 0.9 % IV SOLN
750.0000 mg/m2 | Freq: Once | INTRAVENOUS | Status: AC
  Administered 2017-03-26: 1600 mg via INTRAVENOUS
  Filled 2017-03-26: qty 80

## 2017-03-26 MED ORDER — HEPARIN SOD (PORK) LOCK FLUSH 100 UNIT/ML IV SOLN
500.0000 [IU] | Freq: Once | INTRAVENOUS | Status: AC | PRN
  Administered 2017-03-26: 500 [IU]
  Filled 2017-03-26: qty 5

## 2017-03-26 MED ORDER — ACETAMINOPHEN 325 MG PO TABS
ORAL_TABLET | ORAL | Status: AC
  Filled 2017-03-26: qty 2

## 2017-03-26 MED ORDER — DEXAMETHASONE SODIUM PHOSPHATE 10 MG/ML IJ SOLN
INTRAMUSCULAR | Status: AC
  Filled 2017-03-26: qty 1

## 2017-03-26 MED ORDER — ACETAMINOPHEN 325 MG PO TABS
650.0000 mg | ORAL_TABLET | Freq: Once | ORAL | Status: AC
  Administered 2017-03-26: 650 mg via ORAL

## 2017-03-26 MED ORDER — SODIUM CHLORIDE 0.9% FLUSH
10.0000 mL | INTRAVENOUS | Status: DC | PRN
  Administered 2017-03-26: 10 mL
  Filled 2017-03-26: qty 10

## 2017-03-26 MED ORDER — PALONOSETRON HCL INJECTION 0.25 MG/5ML
INTRAVENOUS | Status: AC
  Filled 2017-03-26: qty 5

## 2017-03-26 MED ORDER — PEGFILGRASTIM 6 MG/0.6ML ~~LOC~~ PSKT
6.0000 mg | PREFILLED_SYRINGE | Freq: Once | SUBCUTANEOUS | Status: AC
  Administered 2017-03-26: 6 mg via SUBCUTANEOUS
  Filled 2017-03-26: qty 0.6

## 2017-03-26 MED ORDER — SODIUM CHLORIDE 0.9 % IV SOLN
2.0000 mg | Freq: Once | INTRAVENOUS | Status: AC
  Administered 2017-03-26: 2 mg via INTRAVENOUS
  Filled 2017-03-26: qty 2

## 2017-03-26 MED ORDER — DEXAMETHASONE SODIUM PHOSPHATE 10 MG/ML IJ SOLN
10.0000 mg | Freq: Once | INTRAMUSCULAR | Status: AC
  Administered 2017-03-26: 10 mg via INTRAVENOUS

## 2017-03-26 MED ORDER — PALONOSETRON HCL INJECTION 0.25 MG/5ML
0.2500 mg | Freq: Once | INTRAVENOUS | Status: AC
  Administered 2017-03-26: 0.25 mg via INTRAVENOUS

## 2017-03-26 MED ORDER — SODIUM CHLORIDE 0.9 % IV SOLN
375.0000 mg/m2 | Freq: Once | INTRAVENOUS | Status: AC
  Administered 2017-03-26: 800 mg via INTRAVENOUS
  Filled 2017-03-26: qty 30

## 2017-03-26 MED ORDER — SODIUM CHLORIDE 0.9 % IV SOLN
Freq: Once | INTRAVENOUS | Status: AC
  Administered 2017-03-26: 09:00:00 via INTRAVENOUS

## 2017-03-26 MED ORDER — DOXORUBICIN HCL CHEMO IV INJECTION 2 MG/ML
50.0000 mg/m2 | Freq: Once | INTRAVENOUS | Status: AC
  Administered 2017-03-26: 106 mg via INTRAVENOUS
  Filled 2017-03-26: qty 53

## 2017-03-26 NOTE — Progress Notes (Signed)
[facility] Progress Note   Bonnie Larsen [id number] 1981/05/15 36 y.o.  Bonnie Larsen is managed by Dr. 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 Dr. Sullivan Lone was contacted. The patient was given an additional 50 mg of Benadryl and had Rituxan restarted after around 15 minutes.   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 She will follow-up with Dr. Sullivan Lone on 04/02/2017.  Please see After Visit Summary for patient specific instructions.  Future Appointments Date Time Provider [place]  04/02/2017 12:00 PM [facility] None  04/02/2017 12:20 PM Kale, Cloria [place], MD [facility] None    No orders of the defined types were placed in this encounter.      Subjective:   Patient ID:  Bonnie Larsen is a 36 y.o. (DOB 02-13-1981) female.  Chief Complaint: No chief complaint on file.   HPI Bonnie Larsen is a 36 year old female with a diagnosis of a nodular lymphocytic predominant Hodgkin's lymphoma who presents to the office today for cycle 1 of R CHOP. She was being titrated up on Rituxan after completing her infusion of Adriamycin, vincristine, and Cytoxan when she reported that she was having a scratchiness in her throat and tingling of her perioral area. She had been premedicated with Decadron 10 mg, Aloxi 0.25 mg, Tylenol 650 mg and Benadryl 50 mg. The patient's Rituxan was paused. The patient was given Pepcid 20 mg and Solu-Medrol 125 mg. Dr. Sullivan Lone was contacted  with the above discussed with him. The patient was given an additional 50 mg of Benadryl and had Rituxan restarted after around 15 minutes. The patient's vital signs were stable. She was oxygenating well with oxygen saturation at 100%. She was doing well but then reported that she was feeling jittery. She was not having riggors, fevers, chills, sweats, cough, shortness of breath, or peripheral edema. She had no additional symptoms of a scratchy throat or perioral tingling. She did have some stomach upset and diarrhea earlier today and did report having cramping in her back. She was given Ativan 0.5 mg as she stated that she felt that she was simply anxious. She has a history of anxiety and takes Ativan as needed. Her feelings of being jittery were similar to those that she experienced with anxiety. The patient continued to have mild feelings of anxiety with jitters and was given an additional 0.5 mg of Ativan. At this time the patient was seen with Dr. Sullivan Lone. Dr. Sullivan Lone talk to the patient about breathing exercises and things that she could do to help relieve her anxiety. He agreed that it was safe to restart Rituxan and to finish chemotherapy today. The patient was seen approximately one half hour later and was found to be sleeping. Her vital signs continued to be stable with good oxygenation.  Medications: I have reviewed the patient's current medications.  Allergies:  Allergies  Allergen Reactions  . Clindamycin/Lincomycin Swelling    FACIAL SWELLING    Past Medical History:  Diagnosis Date  . Anxiety   . Difficult intravenous access   . Family history of adverse reaction to anesthesia    pt's mother has hx. of post-op N/V and hard to wake up post-op  . History of panic attacks    02/08/17- has not had a panic attacks  . Lymphoma ([place]) 01/2017  . Mesenteric lymphadenopathy 01/2017  . Runny nose 02/19/2017   clear drainage, per pt.  . Seasonal allergies     Past Surgical  History:  Procedure Laterality Date  . LYMPH NODE BIOPSY N/A 02/11/2017   Procedure: HAND ASSISTED LAPAROSCOPIC LYMPH NODE BIOPSY;  Surgeon: Stark Klein, MD;  Location: [place];  Service: General;  Laterality: N/A;  . PORTACATH PLACEMENT N/A 02/20/2017   Procedure: INSERTION PORT-A-CATH;  Surgeon: Stark Klein, MD;  Location: [place];  Service: General;  Laterality: N/A;  . TUBAL LIGATION    . UMBILICAL HERNIA REPAIR  02/11/2017    Family History  Problem Relation Age of Onset  . Breast cancer Mother 24  . Fibromyalgia Mother   . Rheum arthritis Mother   . Neuropathy Mother   . Anesthesia problems Mother        post-op N/V; hard to wake up post-op  . Congestive Heart Failure Father        died at age 70  . Cirrhosis Father   . Heart disease Maternal Grandmother   . Diabetes Maternal Grandmother   . Other Brother        Benign spinal tumor  . Cancer Paternal Grandfather        Leukemia    Social History   Social History  . Marital status: Married    Spouse name: N/A  . Number of children: N/A  . Years of education: N/A   Occupational History  . Not on file.   Social History Main Topics  . Smoking status: Never Smoker  . Smokeless tobacco: Never Used  . Alcohol use No  . Drug use: No  . Sexual activity: Yes    Partners: Male    Birth control/ protection: IUD     Comment: tubal ligation   Other Topics Concern  . Not on file   Social History Narrative  . No narrative on file    Past Medical History, Surgical history, Social history, and Family history were reviewed and updated as appropriate.   Please see review of systems for further details on the patient's review from today.   Review of Systems:  Review of Systems  Constitutional: Negative for chills, diaphoresis and fever.  HENT:       Perioral tingling and throat irritation  Respiratory: Negative for cough and shortness of breath.   Cardiovascular: Negative for chest pain,  palpitations and leg swelling.  Gastrointestinal: Positive for diarrhea. Negative for nausea and vomiting.  Musculoskeletal: Positive for back pain.  Neurological: Negative for tremors and speech difficulty.  Psychiatric/Behavioral: The patient is nervous/anxious.     Objective:   Physical Exam:  There were no vitals taken for this visit. ECOG: 0  Physical Exam  Constitutional: No distress.  HENT:  Head: Normocephalic and atraumatic.  Mouth/Throat: Oropharynx is clear and moist. No oropharyngeal exudate.  Eyes: Right eye exhibits no discharge. Left eye exhibits no discharge. No scleral icterus.  Neck: Normal range of motion. Neck supple.  Cardiovascular: Normal rate, regular rhythm and normal heart sounds.  Exam reveals no gallop and no friction rub.   No murmur heard. An accessed port  was noted in the right chest wall. No erythema, edema, increased warmth, or tenderness noted.   Pulmonary/Chest: Effort normal and breath sounds normal. No respiratory distress. She has no wheezes. She has no rales.  Abdominal: Soft. Bowel sounds are normal. She exhibits no distension. There is no tenderness. There is no rebound and no guarding.  Musculoskeletal: She exhibits no edema.  Skin: Skin is warm and dry. No rash noted. She is not diaphoretic. No erythema.    Lab Review:     Component Value Date/Time   NA 139 03/18/2017 1128   K 4.2 03/18/2017 1128   CL 105 02/13/2017 0504   CO2 27 03/18/2017 1128   GLUCOSE 90 03/18/2017 1128   BUN 9.5 03/18/2017 1128   CREATININE 1.0 03/18/2017 1128   CALCIUM 9.6 03/18/2017 1128   PROT 7.4 03/18/2017 1128   ALBUMIN 4.0 03/18/2017 1128   AST 15 03/18/2017 1128   ALT <6 03/18/2017 1128   ALKPHOS 40 03/18/2017 1128   BILITOT 0.61 03/18/2017 1128   GFRNONAA 58 (L) 02/13/2017 0504   GFRAA >60 02/13/2017 0504       Component Value Date/Time   WBC 5.9 03/18/2017 1127   WBC 7.5 02/13/2017 0504   RBC 5.49 (H) 03/18/2017 1127   RBC 5.04  02/13/2017 0504   HGB 14.1 03/18/2017 1127   HCT 43.8 03/18/2017 1127   PLT 193 03/18/2017 1127   MCV 79.8 03/18/2017 1127   MCH 25.7 03/18/2017 1127   MCH 25.4 (L) 02/13/2017 0504   MCHC 32.2 03/18/2017 1127   MCHC 31.7 02/13/2017 0504   RDW 13.8 03/18/2017 1127   LYMPHSABS 2.9 03/18/2017 1127   MONOABS 0.6 03/18/2017 1127   EOSABS 0.7 (H) 03/18/2017 1127   BASOSABS 0.0 03/18/2017 1127   -------------------------------  Imaging from last 24 hours (if applicable):  Radiology interpretation: No results found.      This patient was seen with Dr. [name] with my treatment plan reviewed with him. He expressed agreement with my medical management of this patient.   Addendum Patient was personally seen and interviewed and a physical examination was performed. The clinical findings lab results assessment and plan were reviewed with Sandi Mealy in details. Above documentation reflects our combined assessment and plan.

## 2017-03-26 NOTE — Progress Notes (Unsigned)
Blood return noted before, every 57mls and after Adriamycin push. Blood return also noted before and after Vincristine infusion.  1312 Patient states her throat feels "scratchy" and her tongue feels "tingly". Rituxan stopped, normal saline started wide open, vital signs taken and stable. Sandi Mealy, PA notified. Per Lucianne Lei, NP patient to receive 125mg  IV Solu-medrol, 20mg  IV Pepcid and 50mg  PO Benadryl, see MAR. Per Lucianne Lei, PA per Dr. [name] okay to restart Rituxan at previous rate of 10mls/hr for 36mls 15 minutes after giving Benadryl.   96 Pt states all symptoms have resolved.   1450 Patient states she feels "jittery" like she does when she has anxiety. She does not feel cold or like she is shivering. Rituxan paused, NS started wide open and vital signs taken. VSS. Sandi Mealy, NP notified. Per Lucianne Lei, NP patient to receive 0.5mg  IV ativan and Rituxan can be resumed at same rate after ativan administered.  1520 Patient states that she still feels "jittery" and does not feel like it is getting any better. Lucianne Lei, [place] notified. Per Lucianne Lei, [place] patient to receive additional 0.5mg  IV Ativan.   [place] Dr. [name] at bedside. Per Dr. Ivin Booty may be resumed at previous rate of 71mls/hr for 47mls and may increase rate as tolerated.

## 2017-03-26 NOTE — Patient Instructions (Addendum)
[facility] Discharge Instructions for Patients Receiving Chemotherapy  Today you received the following chemotherapy agents Rituximab,Cyclosphosphamide,Doxorubicin and Vincristine  To help prevent nausea and vomiting after your treatment, we encourage you to take your nausea medication as directed  If you develop nausea and vomiting that is not controlled by your nausea medication, call the clinic.   BELOW ARE SYMPTOMS THAT SHOULD BE REPORTED IMMEDIATELY:  *FEVER GREATER THAN 100.5 F  *CHILLS WITH OR WITHOUT FEVER  NAUSEA AND VOMITING THAT IS NOT CONTROLLED WITH YOUR NAUSEA MEDICATION  *UNUSUAL SHORTNESS OF BREATH  *UNUSUAL BRUISING OR BLEEDING  TENDERNESS IN MOUTH AND THROAT WITH OR WITHOUT PRESENCE OF ULCERS  *URINARY PROBLEMS  *BOWEL PROBLEMS  UNUSUAL RASH Items with * indicate a potential emergency and should be followed up as soon as possible.  Feel free to call the clinic should you have any questions or concerns. The clinic phone number is [phone].  Please show the [facility] at check-in to the Emergency Department and triage nurse.   Rituximab injection What is this medicine? RITUXIMAB (ri TUX i mab) is a monoclonal antibody. It is used to treat certain types of cancer like non-Hodgkin lymphoma and chronic lymphocytic leukemia. It is also used to treat rheumatoid arthritis, granulomatosis with polyangiitis (or Wegener's granulomatosis), and microscopic polyangiitis. This medicine may be used for other purposes; ask your health care provider or pharmacist if you have questions. COMMON BRAND NAME(S): Rituxan What should I tell my health care provider before I take this medicine? They need to know if you have any of these conditions: -heart disease -infection (especially a virus infection such as hepatitis B, chickenpox, cold sores, or herpes) -immune system problems -irregular heartbeat -kidney disease -lung or breathing disease, like  asthma -recently received or scheduled to receive a vaccine -an unusual or allergic reaction to rituximab, mouse proteins, other medicines, foods, dyes, or preservatives -pregnant or trying to get pregnant -breast-feeding How should I use this medicine? This medicine is for infusion into a vein. It is administered in a hospital or clinic by a specially trained health care professional. A special MedGuide will be given to you by the pharmacist with each prescription and refill. Be sure to read this information carefully each time. Talk to your pediatrician regarding the use of this medicine in children. This medicine is not approved for use in children. Overdosage: If you think you have taken too much of this medicine contact a poison control center or emergency room at once. NOTE: This medicine is only for you. Do not share this medicine with others. What if I miss a dose? It is important not to miss a dose. Call your doctor or health care professional if you are unable to keep an appointment. What may interact with this medicine? -cisplatin -other medicines for arthritis like disease modifying antirheumatic drugs or tumor necrosis factor inhibitors -live virus vaccines This list may not describe all possible interactions. Give your health care provider a list of all the medicines, herbs, non-prescription drugs, or dietary supplements you use. Also tell them if you smoke, drink alcohol, or use illegal drugs. Some items may interact with your medicine. What should I watch for while using this medicine? Your condition will be monitored carefully while you are receiving this medicine. You may need blood work done while you are taking this medicine. This medicine can cause serious allergic reactions. To reduce your risk you may need to take medicine before treatment with this medicine. Take  your medicine as directed. In some patients, this medicine may cause a serious brain infection that may cause  death. If you have any problems seeing, thinking, speaking, walking, or standing, tell your doctor right away. If you cannot reach your doctor, urgently seek other source of medical care. Call your doctor or health care professional for advice if you get a fever, chills or sore throat, or other symptoms of a cold or flu. Do not treat yourself. This drug decreases your body's ability to fight infections. Try to avoid being around people who are sick. Do not become pregnant while taking this medicine or for 12 months after stopping it. Women should inform their doctor if they wish to become pregnant or think they might be pregnant. There is a potential for serious side effects to an unborn child. Talk to your health care professional or pharmacist for more information. What side effects may I notice from receiving this medicine? Side effects that you should report to your doctor or health care professional as soon as possible: -breathing problems -chest pain -dizziness or feeling faint -fast, irregular heartbeat -low blood counts - this medicine may decrease the number of white blood cells, red blood cells and platelets. You may be at increased risk for infections and bleeding. -mouth sores -redness, blistering, peeling or loosening of the skin, including inside the mouth (this can be added for any serious or exfoliative rash that could lead to hospitalization) -signs of infection - fever or chills, cough, sore throat, pain or difficulty passing urine -signs and symptoms of kidney injury like trouble passing urine or change in the amount of urine -signs and symptoms of liver injury like dark yellow or brown urine; general ill feeling or flu-like symptoms; light-colored stools; loss of appetite; nausea; right upper belly pain; unusually weak or tired; yellowing of the eyes or skin -stomach pain -vomiting Side effects that usually do not require medical attention (report to your doctor or health care  professional if they continue or are bothersome): -headache -joint pain -muscle cramps or muscle pain This list may not describe all possible side effects. Call your doctor for medical advice about side effects. You may report side effects to FDA at 1-800-FDA-1088. Where should I keep my medicine? This drug is given in a hospital or clinic and will not be stored at home. NOTE: This sheet is a summary. It may not cover all possible information. If you have questions about this medicine, talk to your doctor, pharmacist, or health care provider.  2018 Elsevier/Gold Standard (2016-01-18 15:28:09)   Cyclophosphamide injection What is this medicine? CYCLOPHOSPHAMIDE (sye kloe FOSS fa mide) is a chemotherapy drug. It slows the growth of cancer cells. This medicine is used to treat many types of cancer like lymphoma, myeloma, leukemia, breast cancer, and ovarian cancer, to name a few. This medicine may be used for other purposes; ask your health care provider or pharmacist if you have questions. COMMON BRAND NAME(S): Cytoxan, Neosar What should I tell my health care provider before I take this medicine? They need to know if you have any of these conditions: -blood disorders -history of other chemotherapy -infection -kidney disease -liver disease -recent or ongoing radiation therapy -tumors in the bone marrow -an unusual or allergic reaction to cyclophosphamide, other chemotherapy, other medicines, foods, dyes, or preservatives -pregnant or trying to get pregnant -breast-feeding How should I use this medicine? This drug is usually given as an injection into a vein or muscle or by infusion into a  vein. It is administered in a hospital or clinic by a specially trained health care professional. Talk to your pediatrician regarding the use of this medicine in children. Special care may be needed. Overdosage: If you think you have taken too much of this medicine contact a poison control center or  emergency room at once. NOTE: This medicine is only for you. Do not share this medicine with others. What if I miss a dose? It is important not to miss your dose. Call your doctor or health care professional if you are unable to keep an appointment. What may interact with this medicine? This medicine may interact with the following medications: -amiodarone -amphotericin B -azathioprine -certain antiviral medicines for HIV or AIDS such as protease inhibitors (e.g., indinavir, ritonavir) and zidovudine -certain blood pressure medications such as benazepril, captopril, enalapril, fosinopril, lisinopril, moexipril, monopril, perindopril, quinapril, ramipril, trandolapril -certain cancer medications such as anthracyclines (e.g., daunorubicin, doxorubicin), busulfan, cytarabine, paclitaxel, pentostatin, tamoxifen, trastuzumab -certain diuretics such as chlorothiazide, chlorthalidone, hydrochlorothiazide, indapamide, metolazone -certain medicines that treat or prevent blood clots like warfarin -certain muscle relaxants such as succinylcholine -cyclosporine -etanercept -indomethacin -medicines to increase blood counts like filgrastim, pegfilgrastim, sargramostim -medicines used as general anesthesia -metronidazole -natalizumab This list may not describe all possible interactions. Give your health care provider a list of all the medicines, herbs, non-prescription drugs, or dietary supplements you use. Also tell them if you smoke, drink alcohol, or use illegal drugs. Some items may interact with your medicine. What should I watch for while using this medicine? Visit your doctor for checks on your progress. This drug may make you feel generally unwell. This is not uncommon, as chemotherapy can affect healthy cells as well as cancer cells. Report any side effects. Continue your course of treatment even though you feel ill unless your doctor tells you to stop. Drink water or other fluids as directed.  Urinate often, even at night. In some cases, you may be given additional medicines to help with side effects. Follow all directions for their use. Call your doctor or health care professional for advice if you get a fever, chills or sore throat, or other symptoms of a cold or flu. Do not treat yourself. This drug decreases your body's ability to fight infections. Try to avoid being around people who are sick. This medicine may increase your risk to bruise or bleed. Call your doctor or health care professional if you notice any unusual bleeding. Be careful brushing and flossing your teeth or using a toothpick because you may get an infection or bleed more easily. If you have any dental work done, tell your dentist you are receiving this medicine. You may get drowsy or dizzy. Do not drive, use machinery, or do anything that needs mental alertness until you know how this medicine affects you. Do not become pregnant while taking this medicine or for 1 year after stopping it. Women should inform their doctor if they wish to become pregnant or think they might be pregnant. Men should not father a child while taking this medicine and for 4 months after stopping it. There is a potential for serious side effects to an unborn child. Talk to your health care professional or pharmacist for more information. Do not breast-feed an infant while taking this medicine. This medicine may interfere with the ability to have a child. This medicine has caused ovarian failure in some women. This medicine has caused reduced sperm counts in some men. You should talk with your doctor or  health care professional if you are concerned about your fertility. If you are going to have surgery, tell your doctor or health care professional that you have taken this medicine. What side effects may I notice from receiving this medicine? Side effects that you should report to your doctor or health care professional as soon as  possible: -allergic reactions like skin rash, itching or hives, swelling of the face, lips, or tongue -low blood counts - this medicine may decrease the number of white blood cells, red blood cells and platelets. You may be at increased risk for infections and bleeding. -signs of infection - fever or chills, cough, sore throat, pain or difficulty passing urine -signs of decreased platelets or bleeding - bruising, pinpoint red spots on the skin, black, tarry stools, blood in the urine -signs of decreased red blood cells - unusually weak or tired, fainting spells, lightheadedness -breathing problems -dark urine -dizziness -palpitations -swelling of the ankles, feet, hands -trouble passing urine or change in the amount of urine -weight gain -yellowing of the eyes or skin Side effects that usually do not require medical attention (report to your doctor or health care professional if they continue or are bothersome): -changes in nail or skin color -hair loss -missed menstrual periods -mouth sores -nausea, vomiting This list may not describe all possible side effects. Call your doctor for medical advice about side effects. You may report side effects to FDA at 1-800-FDA-1088. Where should I keep my medicine? This drug is given in a hospital or clinic and will not be stored at home. NOTE: This sheet is a summary. It may not cover all possible information. If you have questions about this medicine, talk to your doctor, pharmacist, or health care provider  2018 Elsevier/Gold Standard (2012-04-25 1   Doxorubicin injection What is this medicine? DOXORUBICIN (dox oh ROO bi sin) is a chemotherapy drug. It is used to treat many kinds of cancer like leukemia, lymphoma, neuroblastoma, sarcoma, and Wilms' tumor. It is also used to treat bladder cancer, breast cancer, lung cancer, ovarian cancer, stomach cancer, and thyroid cancer. This medicine may be used for other purposes; ask your health care  provider or pharmacist if you have questions. COMMON BRAND NAME(S): Adriamycin, Adriamycin PFS, Adriamycin RDF, Rubex What should I tell my health care provider before I take this medicine? They need to know if you have any of these conditions: -heart disease -history of low blood counts caused by a medicine -liver disease -recent or ongoing radiation therapy -an unusual or allergic reaction to doxorubicin, other chemotherapy agents, other medicines, foods, dyes, or preservatives -pregnant or trying to get pregnant -breast-feeding How should I use this medicine? This drug is given as an infusion into a vein. It is administered in a hospital or clinic by a specially trained health care professional. If you have pain, swelling, burning or any unusual feeling around the site of your injection, tell your health care professional right away. Talk to your pediatrician regarding the use of this medicine in children. Special care may be needed. Overdosage: If you think you have taken too much of this medicine contact a poison control center or emergency room at once. NOTE: This medicine is only for you. Do not share this medicine with others. What if I miss a dose? It is important not to miss your dose. Call your doctor or health care professional if you are unable to keep an appointment. What may interact with this medicine? This medicine may interact with the  following medications: -6-mercaptopurine -paclitaxel -phenytoin -St. John's Wort -trastuzumab -verapamil This list may not describe all possible interactions. Give your health care provider a list of all the medicines, herbs, non-prescription drugs, or dietary supplements you use. Also tell them if you smoke, drink alcohol, or use illegal drugs. Some items may interact with your medicine. What should I watch for while using this medicine? This drug may make you feel generally unwell. This is not uncommon, as chemotherapy can affect healthy  cells as well as cancer cells. Report any side effects. Continue your course of treatment even though you feel ill unless your doctor tells you to stop. There is a maximum amount of this medicine you should receive throughout your life. The amount depends on the medical condition being treated and your overall health. Your doctor will watch how much of this medicine you receive in your lifetime. Tell your doctor if you have taken this medicine before. You may need blood work done while you are taking this medicine. Your urine may turn red for a few days after your dose. This is not blood. If your urine is dark or brown, call your doctor. In some cases, you may be given additional medicines to help with side effects. Follow all directions for their use. Call your doctor or health care professional for advice if you get a fever, chills or sore throat, or other symptoms of a cold or flu. Do not treat yourself. This drug decreases your body's ability to fight infections. Try to avoid being around people who are sick. This medicine may increase your risk to bruise or bleed. Call your doctor or health care professional if you notice any unusual bleeding. Talk to your doctor about your risk of cancer. You may be more at risk for certain types of cancers if you take this medicine. Do not become pregnant while taking this medicine or for 6 months after stopping it. Women should inform their doctor if they wish to become pregnant or think they might be pregnant. Men should not father a child while taking this medicine and for 6 months after stopping it. There is a potential for serious side effects to an unborn child. Talk to your health care professional or pharmacist for more information. Do not breast-feed an infant while taking this medicine. This medicine has caused ovarian failure in some women and reduced sperm counts in some men This medicine may interfere with the ability to have a child. Talk with your  doctor or health care professional if you are concerned about your fertility. What side effects may I notice from receiving this medicine? Side effects that you should report to your doctor or health care professional as soon as possible: -allergic reactions like skin rash, itching or hives, swelling of the face, lips, or tongue -breathing problems -chest pain -fast or irregular heartbeat -low blood counts - this medicine may decrease the number of white blood cells, red blood cells and platelets. You may be at increased risk for infections and bleeding. -pain, redness, or irritation at site where injected -signs of infection - fever or chills, cough, sore throat, pain or difficulty passing urine -signs of decreased platelets or bleeding - bruising, pinpoint red spots on the skin, black, tarry stools, blood in the urine -swelling of the ankles, feet, hands -tiredness -weakness Side effects that usually do not require medical attention (report to your doctor or health care professional if they continue or are bothersome): -diarrhea -hair loss -mouth sores -nail discoloration  or damage -nausea -red colored urine -vomiting This list may not describe all possible side effects. Call your doctor for medical advice about side effects. You may report side effects to FDA at 1-800-FDA-1088. Where should I keep my medicine? This drug is given in a hospital or clinic and will not be stored at home. NOTE: This sheet is a summary. It may not cover all possible information. If you have questions about this medicine, talk to your doctor, pharmacist, or health care provider.  2018 Elsevier/Gold Standard (2015-08-08 11:28:51)    Vincristine injection What is this medicine? VINCRISTINE (vin KRIS teen) is a chemotherapy drug. It slows the growth of cancer cells. This medicine is used to treat many types of cancer like Hodgkin's disease, leukemia, non-Hodgkin's lymphoma, neuroblastoma (brain cancer),  rhabdomyosarcoma, and Wilms' tumor. This medicine may be used for other purposes; ask your health care provider or pharmacist if you have questions. COMMON BRAND NAME(S): Oncovin, Vincasar PFS What should I tell my health care provider before I take this medicine? They need to know if you have any of these conditions: -blood disorders -gout -infection (especially chickenpox, cold sores, or herpes) -kidney disease -liver disease -lung disease -nervous system disease like Charcot-Marie-Tooth (CMT) -recent or ongoing radiation therapy -an unusual or allergic reaction to vincristine, other chemotherapy agents, other medicines, foods, dyes, or preservatives -pregnant or trying to get pregnant -breast-feeding How should I use this medicine? This drug is given as an infusion into a vein. It is administered in a hospital or clinic by a specially trained health care professional. If you have pain, swelling, burning, or any unusual feeling around the site of your injection, tell your health care professional right away. Talk to your pediatrician regarding the use of this medicine in children. While this drug may be prescribed for selected conditions, precautions do apply. Overdosage: If you think you have taken too much of this medicine contact a poison control center or emergency room at once. NOTE: This medicine is only for you. Do not share this medicine with others. What if I miss a dose? It is important not to miss your dose. Call your doctor or health care professional if you are unable to keep an appointment. What may interact with this medicine? Do not take this medicine with any of the following medications: -itraconazole -mibefradil -voriconazole This medicine may also interact with the following medications: -cyclosporine -erythromycin -fluconazole -ketoconazole -medicines for HIV like delavirdine, efavirenz, nevirapine -medicines for seizures like ethotoin, fosphenotoin,  phenytoin -medicines to increase blood counts like filgrastim, pegfilgrastim, sargramostim -other chemotherapy drugs like cisplatin, L-asparaginase, methotrexate, mitomycin, paclitaxel -pegaspargase -vaccines -zalcitabine, ddC Talk to your doctor or health care professional before taking any of these medicines: -acetaminophen -aspirin -ibuprofen -ketoprofen -naproxen This list may not describe all possible interactions. Give your health care provider a list of all the medicines, herbs, non-prescription drugs, or dietary supplements you use. Also tell them if you smoke, drink alcohol, or use illegal drugs. Some items may interact with your medicine. What should I watch for while using this medicine? Your condition will be monitored carefully while you are receiving this medicine. You will need important blood work done while you are taking this medicine. This drug may make you feel generally unwell. This is not uncommon, as chemotherapy can affect healthy cells as well as cancer cells. Report any side effects. Continue your course of treatment even though you feel ill unless your doctor tells you to stop. In some cases, you may be  given additional medicines to help with side effects. Follow all directions for their use. Call your doctor or health care professional for advice if you get a fever, chills or sore throat, or other symptoms of a cold or flu. Do not treat yourself. Avoid taking products that contain aspirin, acetaminophen, ibuprofen, naproxen, or ketoprofen unless instructed by your doctor. These medicines may hide a fever. Do not become pregnant while taking this medicine. Women should inform their doctor if they wish to become pregnant or think they might be pregnant. There is a potential for serious side effects to an unborn child. Talk to your health care professional or pharmacist for more information. Do not breast-feed an infant while taking this medicine. Men may have a lower  sperm count while taking this medicine. Talk to your doctor if you plan to father a child. What side effects may I notice from receiving this medicine? Side effects that you should report to your doctor or health care professional as soon as possible: -allergic reactions like skin rash, itching or hives, swelling of the face, lips, or tongue -breathing problems -confusion or changes in emotions or moods -constipation -cough -mouth sores -muscle weakness -nausea and vomiting -pain, swelling, redness or irritation at the injection site -pain, tingling, numbness in the hands or feet -problems with balance, talking, walking -seizures -stomach pain -trouble passing urine or change in the amount of urine Side effects that usually do not require medical attention (report to your doctor or health care professional if they continue or are bothersome): -diarrhea -hair loss -jaw pain -loss of appetite This list may not describe all possible side effects. Call your doctor for medical advice about side effects. You may report side effects to FDA at 1-800-FDA-1088. Where should I keep my medicine? This drug is given in a hospital or clinic and will not be stored at home. NOTE: This sheet is a summary. It may not cover all possible information. If you have questions about this medicine, talk to your doctor, pharmacist, or health care provider.  2018 Elsevier/Gold Standard (2008-03-08 17:17:13)

## 2017-03-26 NOTE — Progress Notes (Unsigned)
Neulasta video and D/C instructions reviewed and discharge paperwork given. Pt's family verbalized understanding.

## 2017-03-27 ENCOUNTER — Telehealth: Payer: Self-pay | Admitting: *Deleted

## 2017-03-27 NOTE — Telephone Encounter (Signed)
-----   Message from Venetia Constable [facility] M sent at 03/26/2017  4:29 PM EDT ----- Regarding: Dr. [name] chemo follow up call Pt of Dr. [name] first time RCHOP.  Pt had reaction to Rituxan with throat itching and tongue feeling itchy. Resumed infusion

## 2017-03-27 NOTE — Telephone Encounter (Signed)
SW pt for chemo follow up call.  Pt denies fever/ N/V/ diarrhea/ mouth sores.  Pt has no complaints.  Verified pt has anti emetics at home if needed.  Clinic number provided for future questions or concerns.  Pt thankful for call.

## 2017-04-01 NOTE — Progress Notes (Signed)
fa    HEMATOLOGY/ONCOLOGY CLINIC NOTE  Date of Service: 04/02/2017   Patient Care Team: Girtha Rm, NP-C as PCP - General (Family Medicine)  CHIEF COMPLAINTS/PURPOSE OF CONSULTATION:  Thoracic and abdominal lymphadenopathy concerning for lymphoproliferative disorder  HISTORY OF PRESENTING ILLNESS:  Bonnie Larsen is a wonderful 36 y.o. female who has been referred to [place] by Dr .Raenette Rover, Laurian Brim, NP-C for evaluation and management of thoracic paraspinal masses and upper abdominal extensive lymphadenopathy.  Patient has a history of uterine fibroids, right breast benign lesion biopsied in 2016, anxiety/panic attacks, migraine headaches and chronic sinus issues who presented to the emergency room brought by EMS for chest pain and shortness of breath about 7-10 days ago. She was noted to be hypertensive with a blood pressure of 180/110 which improved with nitroglycerin. She was also having some back pain and felt like her chest pain was radiating to the back. D-dimer level was borderline elevated. She had a CTA of the chest on 01/06/2017 that showed no evidence of pulmonary embolism. She was however noted to have Multiple prevertebral paraspinal masses, differential diagnosis includes extramedullary hematopoiesis and metastasis/lymphadenopathy. Recommend correlation for laboratory analysis for anemia and lymphoproliferative disease.  Patient was subsequently seen by her primary care physician and had a CT of the abdomen and pelvis with contrast on 01/09/2017 which showed Extensive upper abdominal adenopathy favoring lymphoma/lymphoproliferative disease, tissue diagnosis recommended. No splenomegaly or focal splenic lesion.  She was referred to [place] for further evaluation and management.  She was here with her mother and husband.  She notes no abdominal, or chest trauma. No fevers chills night sweats or unexpected weight loss. Mild constipation but no other change in bowel habits. No GI  bleeding noted. No change in food intake or swallowing. No urinary discomfort or abnormalities. Currently having no chest pain or shortness of breath. Mild upper abdominal discomfort. No other obvious peripheral lymphadenopathy noted.   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 Pt presents to the office today accompanied by her mother. She reports that she is doing well overall. She states she hasn't had much change in appetite. She is having bone pains from her hips down her legs. Pt had an [place] of her left sided neck completed today, 04/02/17 that was ordered by Dr. Barry Dienes to rule out a blood clot due to the tightening and swelling in the neck with results showing in no blood clots. She reports that the prednisone is causing insomnia and would like a lower dose. She reports having dental pain following chemotherapy treatment that she self-treated with orajel. She states that she intermittently has issues with seasonal allergies that she takes sudafed for.  Pt also reports that she would like to start taking OTC vitamins again if it will not conflict with her treatments.    On review of systems, pt reports arthalgias, dry mouth, insomnia, swelling and tension in the left sided neck, and denies mouth sores, fever, chills, HA and any other accompanying symptoms.     MEDICAL HISTORY:  Past Medical History:  Diagnosis Date  . Anxiety   . Difficult intravenous access   . Family history of adverse reaction to anesthesia    pt's mother has hx. of post-op N/V and hard to wake up post-op  . History of panic attacks  02/08/17- has not had a panic attacks  . Lymphoma ([place]) 01/2017  . Mesenteric lymphadenopathy 01/2017  . Runny nose 02/19/2017   clear drainage, per pt.  . Seasonal allergies     SURGICAL HISTORY: Past Surgical History:    Procedure Laterality Date  . LYMPH NODE BIOPSY N/A 02/11/2017   Procedure: HAND ASSISTED LAPAROSCOPIC LYMPH NODE BIOPSY;  Surgeon: Stark Klein, MD;  Location: [place];  Service: General;  Laterality: N/A;  . PORTACATH PLACEMENT N/A 02/20/2017   Procedure: INSERTION PORT-A-CATH;  Surgeon: Stark Klein, MD;  Location: [place];  Service: General;  Laterality: N/A;  . TUBAL LIGATION    . UMBILICAL HERNIA REPAIR  02/11/2017    SOCIAL HISTORY: Social History   Social History  . Marital status: Married    Spouse name: N/A  . Number of children: N/A  . Years of education: N/A   Occupational History  . Not on file.   Social History Main Topics  . Smoking status: Never Smoker  . Smokeless tobacco: Never Used  . Alcohol use No  . Drug use: No  . Sexual activity: Yes    Partners: Male    Birth control/ protection: IUD     Comment: tubal ligation   Other Topics Concern  . Not on file   Social History Narrative  . No narrative on file    FAMILY HISTORY: Family History  Problem Relation Age of Onset  . Breast cancer Mother 68  . Fibromyalgia Mother   . Rheum arthritis Mother   . Neuropathy Mother   . Anesthesia problems Mother        post-op N/V; hard to wake up post-op  . Congestive Heart Failure Father        died at age 65  . Cirrhosis Father   . Heart disease Maternal Grandmother   . Diabetes Maternal Grandmother   . Other Brother        Benign spinal tumor  . Cancer Paternal Grandfather        Leukemia    ALLERGIES:  is allergic to clindamycin/lincomycin.  MEDICATIONS:  Current Outpatient Prescriptions  Medication Sig Dispense Refill  . allopurinol (ZYLOPRIM) 100 MG tablet Take 1 tablet (100 mg total) by mouth 2 (two) times daily. 40 tablet 0  . HYDROcodone-acetaminophen (NORCO) 7.5-325 MG tablet Take 1 tablet by mouth every 6 (six) hours as needed for moderate pain. 30 tablet 0  . ketotifen (ZADITOR) 0.025 % ophthalmic solution 1 drop 2 (two)  times daily.    Marland Kitchen levonorgestrel (MIRENA) 20 MCG/24HR IUD 1 each by Intrauterine route once.    . lidocaine-prilocaine (EMLA) cream Apply 1 application topically as needed. 30 g 2  . LORazepam (ATIVAN) 0.5 MG tablet Take 1 tablet (0.5 mg total) by mouth every 8 (eight) hours as needed for anxiety. 30 tablet 0  . methocarbamol (ROBAXIN) 500 MG tablet Take 1 tablet (500 mg total) by mouth every 8 (eight) hours as needed for muscle spasms. 30 tablet 0  . naproxen sodium (ANAPROX) 220 MG tablet Take 220 mg by mouth 2 (two) times daily with a meal.    . ondansetron (ZOFRAN) 4 MG tablet Take 1 tablet (4 mg total) by mouth daily as needed for nausea or vomiting. 15 tablet 1  . ondansetron (ZOFRAN) 8 MG tablet Take 1 tablet (8 mg total) by mouth 2 (two) times daily as needed for refractory nausea / vomiting. Start on day 3 after chemo. 30 tablet 1  .  prochlorperazine (COMPAZINE) 10 MG tablet Take 1 tablet (10 mg total) by mouth every 6 (six) hours as needed (Nausea or vomiting). 30 tablet 6  . senna (SENOKOT) 8.6 MG TABS tablet Take 1 tablet (8.6 mg total) by mouth 2 (two) times daily. 30 each 1  . predniSONE (DELTASONE) 20 MG tablet Take 4 tablets (80 mg total) by mouth daily. Take on days 1-5 of chemotherapy. (Patient not taking: Reported on 04/02/2017) 20 tablet 6  . traZODone (DESYREL) 50 MG tablet Take 1 tablet (50 mg total) by mouth at bedtime as needed for sleep. 30 tablet 1   No current facility-administered medications for this visit.    Facility-Administered Medications Ordered in Other Visits  Medication Dose Route Frequency Provider Last Rate Last Dose  . sodium chloride flush (NS) 0.9 % injection 10 mL  10 mL Intracatheter PRN Brunetta Genera, MD   10 mL at 03/26/17 1724    REVIEW OF SYSTEMS:    10 Point review of Systems was done is negative except as noted above.  PHYSICAL EXAMINATION:  ECOG PERFORMANCE STATUS: 1 - Symptomatic but completely ambulatory  . Vitals:   04/02/17  1221  BP: 125/74  Pulse: (!) 112  Resp: 18  Temp: 98.8 F (37.1 C)  SpO2: 98%   Filed Weights   04/02/17 1221  Weight: 225 lb 6.4 oz (102.2 kg)   .Body mass index is 39.93 kg/m.  GENERAL:alert, in no acute distress and comfortable SKIN: no acute rashes, no significant lesions EYES: conjunctiva are pink and non-injected, sclera anicteric OROPHARYNX: MMM, no exudates, no oropharyngeal erythema or ulceration NECK: supple, no JVD. Minimal neck swelling bilaterally, mild on the left sided without focal pain, tenderness or induration.  LYMPH:  no palpable lymphadenopathy in the cervical, axillary or inguinal regions LUNGS: clear to auscultation b/l with normal respiratory effort HEART: regular rate & rhythm ABDOMEN:  Healing surgical scar around the umbilicus. No TTP. No peritoneal signs. Some deep TTP mid/upper abd. Extremity: no pedal edema PSYCH: alert & oriented x 3 with fluent speech NEURO: no focal motor/sensory deficits  LABORATORY DATA:  I have reviewed the data as listed  . CBC Latest Ref Rng & Units 04/02/2017 03/18/2017 03/04/2017  WBC 3.9 - 10.3 10e3/uL 5.5 5.9 8.9  Hemoglobin 11.6 - 15.9 g/dL 13.8 14.1 14.1  Hematocrit 34.8 - 46.6 % 43.1 43.8 43.4  Platelets 145 - 400 10e3/uL 130(L) 193 227    . CMP Latest Ref Rng & Units 04/02/2017 03/18/2017 03/04/2017  Glucose 70 - 140 mg/dl 119 90 87  BUN 7.0 - 26.0 mg/dL 11.5 9.5 11.4  Creatinine 0.6 - 1.1 mg/dL 1.0 1.0 1.0  Sodium 136 - 145 mEq/L 139 139 142  Potassium 3.5 - 5.1 mEq/L 4.1 4.2 4.0  Chloride 101 - 111 mmol/L - - -  CO2 22 - 29 mEq/L 27 27 27   Calcium 8.4 - 10.4 mg/dL 9.2 9.6 9.9  Total Protein 6.4 - 8.3 g/dL 6.5 7.4 7.6  Total Bilirubin 0.20 - 1.20 mg/dL 0.42 0.61 0.43  Alkaline Phos 40 - 150 U/L 65 40 43  AST 5 - 34 U/L 11 15 15   ALT 0 - 55 U/L 11 <6 7   Component     Latest Ref Rng & Units 01/14/2017  LDH     125 - 245 U/L 193  HIV Screen 4th Generation wRfx     Non Reactive Non Reactive  Hep C Virus  Ab     0.0 - 0.9  s/co ratio <0.1  hCG,Beta Subunit,Qual,Serum     Negative <6 mIU/mL Negative  Sed Rate     0 - 32 mm/hr 7   Lymphnode biopsy, 02/11/2017 Diagnosis Lymph node for lymphoma, Mesenteric Lymphadenopathy - HODGKIN LYMPHOMA.         PROCEDURES  ECHO 03/13/17 Study Conclusions - Left ventricle: The cavity size was normal. Systolic function was   normal. The estimated ejection fraction was in the range of 55%   to 60%. Wall motion was normal; there were no regional wall   motion abnormalities. Left ventricular diastolic function   parameters were normal. - Atrial septum: No defect or patent foramen ovale was identified. - Impressions: Normal GLS -20.2.   RADIOGRAPHIC STUDIES: I have personally reviewed the radiological images as listed and agreed with the findings in the report. No results found. NM PET Image Initial (PI) Skull Base To Thigh (Accession [id number]) (Order [id number])  Imaging  Date: 02/01/2017 Department: [facility] Released By: Hilda Lias Authorizing: Brunetta Genera, MD  Exam Information   Status Exam Begun  Exam Ended   Final [99] 02/01/2017 1:34 PM 02/01/2017 3:02 PM  PACS Images   Show images for NM PET Image Initial (PI) Skull Base To Thigh  Study Result   CLINICAL DATA:  Initial treatment strategy for abdominal and thoracic lymphadenopathy on recent CT scans.  EXAM: NUCLEAR MEDICINE PET SKULL BASE TO THIGH  TECHNIQUE: 10.8 mCi F-18 FDG was injected intravenously. Full-ring PET imaging was performed from the skull base to thigh after the radiotracer. CT data was obtained and used for attenuation correction and anatomic localization.  FASTING BLOOD GLUCOSE:  Value: 91 mg/dl  COMPARISON:  CT scans dated 01/09/2017  FINDINGS: NECK  No hypermetabolic lymph nodes in the neck.  CHEST  Right paratracheal, subcarinal, paraesophageal, and para aortic/pleural soft tissue  density nodules are identified in the chest, most of which demonstrate relatively low-grade in activity. A hypermetabolic periaortic lymph node on image 79/4 has a maximum SUV of 4.7 which is fairly representative of the other lymph nodes in the chest.  A smooth oval-shaped right breast lesion measuring 4.0 by 1.8 cm has a maximum SUV of 3.4.  The lungs appear clear.  ABDOMEN/PELVIS  Extensive bulky retroperitoneal, mesenteric, porta hepatis, retrocrural, and peripancreatic adenopathy observed. A mesenteric lymph node on image 130/4 measures 3.5 cm in short axis and has a maximum standard uptake value of 12.1. A lymph node above the pancreatic body measures 3.6 cm in short axis on image 102/4 and has a maximum SUV of 13.2. New  No splenomegaly. No focal splenic or hepatic lesion. The extensive peripancreatic nodal disease makes it difficult to completely exclude a pancreatic lesion although I am skeptical of a pancreatic parenchymal lesion.  Non rotated right kidney. 5.4 by 4.7 cm right right for uterine adnexal cystic lesion appears photopenic. Fairly empty urinary bladder with flattened appearance causing several adjacent collections of urine containing FDG ; this simulates an anterior uterine lesion but there is no CT correlate on either today's exam or the 01/09/2017 exam to suggest that there is a true uterine lesion.  SKELETON  Low-grade diffuse mildly accentuated activity throughout the skeleton but without a focal skeletal lesion identified.  IMPRESSION: 1. Hypermetabolic retroperitoneal, mesenteric, peripancreatic, porta hepatis, and retrocrural adenopathy. 2. Nodularity in the left paraspinal, subcarinal, and paraesophageal regions in the chest as lower but still abnormal metabolic activity, and probably represents adenopathy rather than extramedullary hematopoiesis. There is also  a faintly metabolic lesion in the right breast which probably represents  the same lesion biopsied in 2016, with benign results. 3. Photopenic cystic right adnexal lesion. An IUD is present in the uterus.   Electronically Signed   By: Van Clines M.D.   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 -PET/CT shows fairly active disease. Abdominal disease if progression occurs could potentially cause organ injury. -LDH level and sedimentation rate within normal limits . -HIV negative  -Hepatitis C negative. -Pathology results were discussed in detail with the patient.  -We previously discussed the diagnosis, natural history, prognosis, staging, uncertainty of definitive treatment option, risk of histologic transformation, adverse risk factors and pros and cons of different treatment options. -Patient understands these. We discussed that there is lack of large clinical trials. NCCN guidelines were discussed with her. She understands all this and appreciates the explanation.  -As per NCCN guidelines the valid options could be observation versus single agent Rituxan versus combined modality therapy. -I previously discussed that her extensive and FDG avid abdominal disease and advanced at least stage III disease with potential for histology transformation and involvement of abdominal organs leads me to not recommend observation. -Single agent Rituxan would probably cause some response but is unlikely due to produce CR with her advanced stage disease. -Baseline ECHO from 03/13/17 shows normal heart function, EF 55%-60% Plan -labs are stable and patient reports no prohibitive toxicities. -Will decrease dose of prednisone that is taken following treatment as well as  what is given with treatments    #2 Urinary urgency- UA neg. Symptoms resolved  #3  Patient Active Problem List   Diagnosis Date Noted  . 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 Mesenteric lymphadenopathy 02/11/2017  . Adenopathy 01/10/2017  . 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 Abnormal chest CT 01/07/2017  . History of panic attacks   . Bacterial pharyngitis 07/30/2016  -continue f/u with PCP for other chronic medical issues  #4 anxiety -Pt requests medication to help her anxiety through chemo PLAN:  -Patient to d/c ativan use and to take Trazodone PRN  #4 Left neck pain -- has port on left side [place] neg for DVT. Likely muscle strain  Plan -Patient will be prescribed robaxin as muscle relaxant. -I will refill patient Norco Rx prn for pain control.  C with Dr [name] C2D1 with labs  All of the patients questions were answered with apparent satisfaction. The patient knows to call the clinic with any problems, questions or concerns.  I spent 20 minutes counseling the patient face to face. The total time spent in the appointment was 25 minutes and more than 50% was on counseling and direct patient cares.    Sullivan Lone MD [place] AAHIVMS [facility] Hematology/Oncology Physician [facility]  (Office):       [phone] (Work cell):  [phone] (Fax):           [phone]  This document serves as a record of services personally performed by Sullivan Lone, MD. It was created on her behalf by Alean Rinne, a trained medical scribe. The creation of this record is based on the scribe's personal observations and the provider's statements to them. This document  has been checked and approved by the attending provider.

## 2017-04-02 ENCOUNTER — Encounter: Payer: Self-pay | Admitting: Hematology

## 2017-04-02 ENCOUNTER — Ambulatory Visit (HOSPITAL_BASED_OUTPATIENT_CLINIC_OR_DEPARTMENT_OTHER): Payer: 59 | Admitting: Hematology

## 2017-04-02 ENCOUNTER — Other Ambulatory Visit (HOSPITAL_COMMUNITY): Payer: Self-pay | Admitting: General Surgery

## 2017-04-02 ENCOUNTER — Other Ambulatory Visit (HOSPITAL_BASED_OUTPATIENT_CLINIC_OR_DEPARTMENT_OTHER): Payer: 59

## 2017-04-02 ENCOUNTER — Telehealth: Payer: Self-pay

## 2017-04-02 ENCOUNTER — Ambulatory Visit: Payer: 59 | Admitting: Hematology

## 2017-04-02 ENCOUNTER — Ambulatory Visit (HOSPITAL_COMMUNITY)
Admission: RE | Admit: 2017-04-02 | Discharge: 2017-04-02 | Disposition: A | Discharge status: Home / Self Care | Payer: 59 | Source: Ambulatory Visit | Attending: General Surgery | Admitting: General Surgery

## 2017-04-02 VITALS — BP 125/74 | HR 112 | Temp 98.8°F | Resp 18 | Ht 63.0 in | Wt 225.4 lb

## 2017-04-02 DIAGNOSIS — M25512 Pain in left shoulder: Secondary | ICD-10-CM | POA: Insufficient documentation

## 2017-04-02 DIAGNOSIS — F419 Anxiety disorder, unspecified: Secondary | ICD-10-CM | POA: Diagnosis not present

## 2017-04-02 DIAGNOSIS — Z95828 Presence of other vascular implants and grafts: Secondary | ICD-10-CM | POA: Diagnosis not present

## 2017-04-02 DIAGNOSIS — C8108 Nodular lymphocyte predominant Hodgkin lymphoma, lymph nodes of multiple sites: Secondary | ICD-10-CM | POA: Diagnosis not present

## 2017-04-02 DIAGNOSIS — R52 Pain, unspecified: Secondary | ICD-10-CM | POA: Diagnosis not present

## 2017-04-02 DIAGNOSIS — M542 Cervicalgia: Secondary | ICD-10-CM | POA: Diagnosis not present

## 2017-04-02 DIAGNOSIS — M79609 Pain in unspecified limb: Secondary | ICD-10-CM | POA: Insufficient documentation

## 2017-04-02 LAB — COMPREHENSIVE METABOLIC PANEL
ALK PHOS: 65 U/L (ref 40–150)
ALT: 11 U/L (ref 0–55)
ANION GAP: 8 meq/L (ref 3–11)
AST: 11 U/L (ref 5–34)
Albumin: 3.8 g/dL (ref 3.5–5.0)
BILIRUBIN TOTAL: 0.42 mg/dL (ref 0.20–1.20)
BUN: 11.5 mg/dL (ref 7.0–26.0)
CO2: 27 meq/L (ref 22–29)
Calcium: 9.2 mg/dL (ref 8.4–10.4)
Chloride: 104 mEq/L (ref 98–109)
Creatinine: 1 mg/dL (ref 0.6–1.1)
EGFR: 88 mL/min/{1.73_m2} — AB (ref >90)
Glucose: 119 mg/dl (ref 70–140)
POTASSIUM: 4.1 meq/L (ref 3.5–5.1)
Sodium: 139 mEq/L (ref 136–145)
Total Protein: 6.5 g/dL (ref 6.4–8.3)

## 2017-04-02 LAB — CBC WITH DIFFERENTIAL/PLATELET
BASO%: 0.1 % (ref 0.0–2.0)
Basophils Absolute: 0 10*3/uL (ref 0.0–0.1)
EOS ABS: 0.1 10*3/uL (ref 0.0–0.5)
EOS%: 2.3 % (ref 0.0–7.0)
HCT: 43.1 % (ref 34.8–46.6)
HGB: 13.8 g/dL (ref 11.6–15.9)
LYMPH%: 38.1 % (ref 14.0–49.7)
MCH: 25.3 pg (ref 25.1–34.0)
MCHC: 31.9 g/dL (ref 31.5–36.0)
MCV: 79.2 fL — ABNORMAL LOW (ref 79.5–101.0)
MONO#: 0.2 10*3/uL (ref 0.1–0.9)
MONO%: 3.3 % (ref 0.0–14.0)
NEUT#: 3.1 10*3/uL (ref 1.5–6.5)
NEUT%: 56.2 % (ref 38.4–76.8)
PLATELETS: 130 10*3/uL — AB (ref 145–400)
RBC: 5.44 10*6/uL (ref 3.70–5.45)
RDW: 14 % (ref 11.2–14.5)
WBC: 5.5 10*3/uL (ref 3.9–10.3)
lymph#: 2.1 10*3/uL (ref 0.9–3.3)

## 2017-04-02 MED ORDER — TRAZODONE HCL 50 MG PO TABS: 50.0000 mg | ORAL_TABLET | Freq: Every evening | ORAL | 1 refills | Status: DC | PRN

## 2017-04-02 MED ORDER — HYDROCODONE-ACETAMINOPHEN 7.5-325 MG PO TABS: 1.0000 | ORAL_TABLET | Freq: Four times a day (QID) | ORAL | 0 refills | Status: DC | PRN

## 2017-04-02 MED ORDER — METHOCARBAMOL 500 MG PO TABS: 500.0000 mg | ORAL_TABLET | Freq: Three times a day (TID) | ORAL | 0 refills | Status: DC | PRN

## 2017-04-02 NOTE — Progress Notes (Signed)
*  PRELIMINARY RESULTS* Vascular Ultrasound Left upper ext venous duplex has been completed.  Preliminary findings: No evidence of DVT or superficial thrombosis.    Called results to April.  Landry Mellow, RDMS, RVT  04/02/2017, 10:53 AM

## 2017-04-02 NOTE — Progress Notes (Signed)
Please let pt know ultrasound is normal.

## 2017-04-02 NOTE — Telephone Encounter (Signed)
Printed avs and calender per 10/9 los  

## 2017-04-04 ENCOUNTER — Telehealth: Payer: Self-pay | Admitting: Hematology

## 2017-04-04 ENCOUNTER — Telehealth: Payer: Self-pay

## 2017-04-04 MED FILL — METHOCARBAMOL 500 MG TABLET: 500 | 10 days supply | Qty: 30 | Fill #0

## 2017-04-04 NOTE — Telephone Encounter (Signed)
Pt had question regarding schedule for 10/23. Sent scheduling message for review and update of appointments currently scheduled. Pt needs lab, flush, MD, and infusion (6.5 hours). Seemed to be some confusion between communication from Dr. [name] to scheduling. Pt to expect a call from scheduling to adjust current schedule. Robaxin on back-order at [employer]. Had medication discontinued at [facility] and phone-in order to [facility] per pt request.

## 2017-04-04 NOTE — Telephone Encounter (Signed)
04/03/17 prescription refill scanned in media °

## 2017-04-11 ENCOUNTER — Telehealth: Payer: Self-pay | Admitting: *Deleted

## 2017-04-11 NOTE — Telephone Encounter (Signed)
Pt called stating she is almost out of current allopurinol rx.  Per Dr. [name], pt can discontinue rx when finished with current rx.  LVM with patient with this information.  Call back number provided for questions.

## 2017-04-14 NOTE — Progress Notes (Signed)
HEMATOLOGY/ONCOLOGY CLINIC NOTE  Date of Service: 04/16/2017   Patient Care Team: Girtha Rm, NP-C as PCP - General (Family Medicine)  CHIEF COMPLAINTS/PURPOSE OF CONSULTATION:  Thoracic and abdominal lymphadenopathy concerning for lymphoproliferative disorder  HISTORY OF PRESENTING ILLNESS:  Bonnie Larsen is a wonderful 36 y.o. female who has been referred to [place] by Dr .Raenette Rover, Laurian Brim, NP-C for evaluation and management of thoracic paraspinal masses and upper abdominal extensive lymphadenopathy.  Patient has a history of uterine fibroids, right breast benign lesion biopsied in 2016, anxiety/panic attacks, migraine headaches and chronic sinus issues who presented to the emergency room brought by EMS for chest pain and shortness of breath about 7-10 days ago. She was noted to be hypertensive with a blood pressure of 180/110 which improved with nitroglycerin. She was also having some back pain and felt like her chest pain was radiating to the back. D-dimer level was borderline elevated. She had a CTA of the chest on 01/06/2017 that showed no evidence of pulmonary embolism. She was however noted to have Multiple prevertebral paraspinal masses, differential diagnosis includes extramedullary hematopoiesis and metastasis/lymphadenopathy. Recommend correlation for laboratory analysis for anemia and lymphoproliferative disease.  Patient was subsequently seen by her primary care physician and had a CT of the abdomen and pelvis with contrast on 01/09/2017 which showed Extensive upper abdominal adenopathy favoring lymphoma/lymphoproliferative disease, tissue diagnosis recommended. No splenomegaly or focal splenic lesion.  She was referred to [place] for further evaluation and management.  She was here with her mother and husband.  She notes no abdominal, or chest trauma. No fevers chills night sweats or unexpected weight loss. Mild constipation but no other change in bowel habits. No GI bleeding  noted. No change in food intake or swallowing. No urinary discomfort or abnormalities. Currently having no chest pain or shortness of breath. Mild upper abdominal discomfort. No other obvious peripheral lymphadenopathy noted.   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 She reports that she is doing well overall.  She is no longer taking allopurinol given low risk of TLS going ahead.  On review of systems, pt reports BLE myalgias. She notes that she has mildly resolved neck tightness. She denies neck pain or pulling sensation. Pt reports lower abdominal pain with sitting up. She reports resolved non-pruritic rash to her bilateral cheeks and chin this week. She notes that she often obtains seasonal allergies this time of year. She has a mildly resolved blister to her left lower lip that she has self-treated with Abreva. She reports that her appetite is improving. She denies fatigue or decreased energy levels.     MEDICAL HISTORY:  Past Medical History:  Diagnosis Date  . Anxiety   . Difficult intravenous access   . Family history of adverse reaction to anesthesia    pt's mother has hx. of post-op N/V and hard to wake up post-op  . History of panic attacks    02/08/17- has not had a panic attacks  . Lymphoma ([place]) 01/2017  . Mesenteric lymphadenopathy 01/2017  . Runny nose 02/19/2017   clear drainage, per pt.  . Seasonal allergies     SURGICAL HISTORY: Past Surgical History:  Procedure Laterality Date  . LYMPH NODE  BIOPSY N/A 02/11/2017   Procedure: HAND ASSISTED LAPAROSCOPIC LYMPH NODE BIOPSY;  Surgeon: Stark Klein, MD;  Location: [facility];  Service: General;  Laterality: N/A;  . PORTACATH PLACEMENT N/A 02/20/2017   Procedure: INSERTION PORT-A-CATH;   Surgeon: Stark Klein, MD;  Location: [facility];  Service: General;  Laterality: N/A;  . TUBAL LIGATION    . UMBILICAL HERNIA REPAIR  02/11/2017    SOCIAL HISTORY: Social History   Social History  . Marital status: Married    Spouse name: N/A  . Number of children: N/A  . Years of education: N/A   Occupational History  . Not on file.   Social History Main Topics  . Smoking status: Never Smoker  . Smokeless tobacco: Never Used  . Alcohol use No  . Drug use: No  . Sexual activity: Yes    Partners: Male    Birth control/ protection: IUD     Comment: tubal ligation   Other Topics Concern  . Not on file   Social History Narrative  . No narrative on file    FAMILY HISTORY: Family History  Problem Relation Age of Onset  . Breast cancer Mother 34  . Fibromyalgia Mother   . Rheum arthritis Mother   . Neuropathy Mother   . Anesthesia problems Mother        post-op N/V; hard to wake up post-op  . Congestive Heart Failure Father        died at age 73  . Cirrhosis Father   . Heart disease Maternal Grandmother   . Diabetes Maternal Grandmother   . Other Brother        Benign spinal tumor  . Cancer Paternal Grandfather        Leukemia    ALLERGIES:  is allergic to clindamycin/lincomycin.  MEDICATIONS:  Current Outpatient Prescriptions  Medication Sig Dispense Refill  . allopurinol (ZYLOPRIM) 100 MG tablet Take 1 tablet (100 mg total) by mouth 2 (two) times daily. 40 tablet 0  . HYDROcodone-acetaminophen (NORCO) 7.5-325 MG tablet Take 1 tablet by mouth every 6 (six) hours as needed for moderate pain. 30 tablet 0  . ketotifen (ZADITOR) 0.025 % ophthalmic solution 1 drop 2 (two) times daily.    Marland Kitchen levonorgestrel (MIRENA) 20 MCG/24HR IUD 1 each by Intrauterine route once.    . lidocaine-prilocaine (EMLA) cream Apply 1 application topically as needed. 30 g 2  . LORazepam (ATIVAN) 0.5 MG tablet Take 1 tablet (0.5 mg total) by mouth every 8 (eight) hours as  needed for anxiety. 30 tablet 0  . methocarbamol (ROBAXIN) 500 MG tablet Take 1 tablet (500 mg total) by mouth every 8 (eight) hours as needed for muscle spasms. 30 tablet 0  . naproxen sodium (ANAPROX) 220 MG tablet Take 220 mg by mouth 2 (two) times daily with a meal.    . ondansetron (ZOFRAN) 4 MG tablet Take 1 tablet (4 mg total) by mouth daily as needed for nausea or vomiting. 15 tablet 1  . ondansetron (ZOFRAN) 8 MG tablet Take 1 tablet (8 mg total) by mouth 2 (two) times daily as needed for refractory nausea / vomiting. Start on day 3 after chemo. 30 tablet 1  . predniSONE (DELTASONE) 20 MG tablet Take 4 tablets (80 mg total) by mouth daily. Take on days 1-5 of chemotherapy. 20 tablet 5  . prochlorperazine (COMPAZINE) 10 MG tablet Take 1 tablet (10 mg total) by mouth every 6 (six) hours as needed (Nausea or vomiting). 30 tablet 6  .  senna (SENOKOT) 8.6 MG TABS tablet Take 1 tablet (8.6 mg total) by mouth 2 (two) times daily. 30 each 1  . traZODone (DESYREL) 50 MG tablet Take 1 tablet (50 mg total) by mouth at bedtime as needed for sleep. 30 tablet 1   No current facility-administered medications for this visit.    Facility-Administered Medications Ordered in Other Visits  Medication Dose Route Frequency Provider Last Rate Last Dose  . sodium chloride flush (NS) 0.9 % injection 10 mL  10 mL Intracatheter PRN Brunetta Genera, MD   10 mL at 03/26/17 1724  . sodium chloride flush (NS) 0.9 % injection 10 mL  10 mL Intravenous PRN Brunetta Genera, MD   10 mL at 04/16/17 1112    REVIEW OF SYSTEMS:    10 Point review of Systems was done is negative except as noted above.  PHYSICAL EXAMINATION:  ECOG PERFORMANCE STATUS: 1 - Symptomatic but completely ambulatory  . Vitals:   04/16/17 1126  BP: 113/84  Pulse: 100  Resp: 18  Temp: 98.4 F (36.9 C)  SpO2: 100%   Filed Weights   04/16/17 1126  Weight: 227 lb (103 kg)   .Body mass index is 40.21 kg/m.  GENERAL:alert, in no  acute distress and comfortable SKIN: no acute rashes, no significant lesions EYES: conjunctiva are pink and non-injected, sclera anicteric OROPHARYNX: MMM, no exudates, no oropharyngeal erythema or ulceration NECK: supple, no JVD. Minimal neck swelling bilaterally, mild on the left sided without focal pain, tenderness or induration.  LYMPH:  no palpable lymphadenopathy in the cervical, axillary or inguinal regions LUNGS: clear to auscultation b/l with normal respiratory effort HEART: regular rate & rhythm ABDOMEN:  Healing surgical scar around the umbilicus. No TTP. No peritoneal signs. Some deep TTP mid/upper abd. Extremity: no pedal edema PSYCH: alert & oriented x 3 with fluent speech NEURO: no focal motor/sensory deficits  LABORATORY DATA:  I have reviewed the data as listed  . CBC Latest Ref Rng & Units 04/16/2017 04/02/2017 03/18/2017  WBC 3.9 - 10.3 10e3/uL 5.0 5.5 5.9  Hemoglobin 11.6 - 15.9 g/dL 13.8 13.8 14.1  Hematocrit 34.8 - 46.6 % 42.4 43.1 43.8  Platelets 145 - 400 10e3/uL 330 130(L) 193    . CMP Latest Ref Rng & Units 04/16/2017 04/02/2017 03/18/2017  Glucose 70 - 140 mg/dl 101 119 90  BUN 7.0 - 26.0 mg/dL 9.8 11.5 9.5  Creatinine 0.6 - 1.1 mg/dL 0.9 1.0 1.0  Sodium 136 - 145 mEq/L 138 139 139  Potassium 3.5 - 5.1 mEq/L 4.4 4.1 4.2  Chloride 101 - 111 mmol/L - - -  CO2 22 - 29 mEq/L 25 27 27   Calcium 8.4 - 10.4 mg/dL 9.9 9.2 9.6  Total Protein 6.4 - 8.3 g/dL 7.0 6.5 7.4  Total Bilirubin 0.20 - 1.20 mg/dL 0.42 0.42 0.61  Alkaline Phos 40 - 150 U/L 39(L) 65 40  AST 5 - 34 U/L 16 11 15   ALT 0 - 55 U/L 13 11 <6   Component     Latest Ref Rng & Units 01/14/2017  LDH     125 - 245 U/L 193  HIV Screen 4th Generation wRfx     Non Reactive Non Reactive  Hep C Virus Ab     0.0 - 0.9 s/co ratio <0.1  hCG,Beta Subunit,Qual,Serum     Negative <6 mIU/mL Negative  Sed Rate     0 - 32 mm/hr 7   Lymphnode biopsy, 02/11/2017 Diagnosis Lymph node for  lymphoma, Mesenteric  Lymphadenopathy - HODGKIN LYMPHOMA.         PROCEDURES  ECHO 03/13/17 Study Conclusions - Left ventricle: The cavity size was normal. Systolic function was   normal. The estimated ejection fraction was in the range of 55%   to 60%. Wall motion was normal; there were no regional wall   motion abnormalities. Left ventricular diastolic function   parameters were normal. - Atrial septum: No defect or patent foramen ovale was identified. - Impressions: Normal GLS -20.2.   RADIOGRAPHIC STUDIES: I have personally reviewed the radiological images as listed and agreed with the findings in the report. No results found. NM PET Image Initial (PI) Skull Base To Thigh (Accession [id number]) (Order [id number])  Imaging  Date: 02/01/2017 Department: [facility] Released By: Hilda Lias Authorizing: Brunetta Genera, MD  Exam Information   Status Exam Begun  Exam Ended   Final [99] 02/01/2017 1:34 PM 02/01/2017 3:02 PM  PACS Images   Show images for NM PET Image Initial (PI) Skull Base To Thigh  Study Result   CLINICAL DATA:  Initial treatment strategy for abdominal and thoracic lymphadenopathy on recent CT scans.  EXAM: NUCLEAR MEDICINE PET SKULL BASE TO THIGH  TECHNIQUE: 10.8 mCi F-18 FDG was injected intravenously. Full-ring PET imaging was performed from the skull base to thigh after the radiotracer. CT data was obtained and used for attenuation correction and anatomic localization.  FASTING BLOOD GLUCOSE:  Value: 91 mg/dl  COMPARISON:  CT scans dated 01/09/2017  FINDINGS: NECK  No hypermetabolic lymph nodes in the neck.  CHEST  Right paratracheal, subcarinal, paraesophageal, and para aortic/pleural soft tissue density nodules are identified in the chest, most of which demonstrate relatively low-grade in activity. A hypermetabolic periaortic lymph node on image 79/4 has a maximum SUV of 4.7 which is fairly  representative of the other lymph nodes in the chest.  A smooth oval-shaped right breast lesion measuring 4.0 by 1.8 cm has a maximum SUV of 3.4.  The lungs appear clear.  ABDOMEN/PELVIS  Extensive bulky retroperitoneal, mesenteric, porta hepatis, retrocrural, and peripancreatic adenopathy observed. A mesenteric lymph node on image 130/4 measures 3.5 cm in short axis and has a maximum standard uptake value of 12.1. A lymph node above the pancreatic body measures 3.6 cm in short axis on image 102/4 and has a maximum SUV of 13.2. New  No splenomegaly. No focal splenic or hepatic lesion. The extensive peripancreatic nodal disease makes it difficult to completely exclude a pancreatic lesion although I am skeptical of a pancreatic parenchymal lesion.  Non rotated right kidney. 5.4 by 4.7 cm right right for uterine adnexal cystic lesion appears photopenic. Fairly empty urinary bladder with flattened appearance causing several adjacent collections of urine containing FDG ; this simulates an anterior uterine lesion but there is no CT correlate on either today's exam or the 01/09/2017 exam to suggest that there is a true uterine lesion.  SKELETON  Low-grade diffuse mildly accentuated activity throughout the skeleton but without a focal skeletal lesion identified.  IMPRESSION: 1. Hypermetabolic retroperitoneal, mesenteric, peripancreatic, porta hepatis, and retrocrural adenopathy. 2. Nodularity in the left paraspinal, subcarinal, and paraesophageal regions in the chest as lower but still abnormal metabolic activity, and probably represents adenopathy rather than extramedullary hematopoiesis. There is also a faintly metabolic lesion in the right breast which probably represents the same lesion biopsied in 2016, with benign results. 3. Photopenic cystic right adnexal lesion. An IUD is present in the uterus.  Electronically Signed   By: Van Clines M.D.   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 -PET/CT shows fairly active disease. Abdominal disease if progression occurs could potentially cause organ injury. -LDH level and sedimentation rate within normal limits . -HIV negative  -Hepatitis C negative. -Pathology results were discussed in detail with the patient.  -We previously discussed the diagnosis, natural history, prognosis, staging, uncertainty of definitive treatment option, risk of histologic transformation, adverse risk factors and pros and cons of different treatment options. -Patient understands these. We discussed that there is lack of large clinical trials. NCCN guidelines were discussed with her. She understands all this and appreciates the explanation.  -As per NCCN guidelines the valid options could be observation versus single agent Rituxan versus combined modality therapy. -I previously discussed that her extensive and FDG avid abdominal disease and advanced at least stage III disease with potential for histology transformation and involvement of abdominal organs leads me to not recommend observation. -Single agent Rituxan would probably cause some response but is unlikely due to produce CR with her advanced stage disease. -Baseline ECHO from 03/13/17 shows normal heart function, EF 55%-60% Plan -labs are stable and patient reports no prohibitive toxicities. -Will decrease dose of prednisone to 60 mg at this time due to significant issues with anxiety and insomnia. -Will prescribe ativan to use prn for anxiety -Will d/c patient's Trazodone prescription and advised the patient to take OTC melatonin per her preference. -follow up in 3  weeks with rpt labs with C3D1 of treatment -Repeat PET/CT  after 3rd cycle.    #2 Urinary urgency- UA neg. Symptoms resolved  #3  Patient Active Problem List   Diagnosis Date Noted  . Nodular lymphocyte predominant Hodgkin lymphoma of lymph nodes of multiple regions ([place]) 03/19/2017  . Mesenteric lymphadenopathy 02/11/2017  . Adenopathy 01/10/2017  . 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 Abnormal chest CT 01/07/2017  . History of panic attacks   . Bacterial pharyngitis 07/30/2016  -continue f/u with PCP for other chronic medical issues  #4 anxiety -Pt requests medication to help her anxiety through chemo PLAN:  -Previously advised patient to d/c ativan use and to take Trazodone PRN  #4 Left neck pain -- has port on left side. Now resolved. [place] neg for DVT. Likely muscle strain  Plan -Patient previously prescribed robaxin as muscle relaxant. Now off the medication. -I previously refilled patient Norco Rx prn for pain control.  RTC with Dr [name] C3D1 with labs   All of the patients questions were answered with apparent satisfaction. The patient knows to call the clinic with any problems, questions or concerns.  I spent 20 minutes counseling the patient face to face. The total time spent in the appointment was 25 minutes and more than 50% was on counseling and direct patient cares.    Sullivan Lone MD [place] AAHIVMS [facility] Hematology/Oncology Physician [facility]  (Office):       [phone] (Work cell):  [phone] (Fax):           [phone]  This document serves as a record of services personally performed by Sullivan Lone, MD. It was created on her behalf by Steva Colder, a trained medical scribe. The creation of this  record is based on the scribe's personal observations and the provider's statements to them. This document has been checked and approved by the attending provider.

## 2017-04-15 ENCOUNTER — Other Ambulatory Visit: Payer: Self-pay

## 2017-04-15 ENCOUNTER — Telehealth: Payer: Self-pay

## 2017-04-15 DIAGNOSIS — C8108 Nodular lymphocyte predominant Hodgkin lymphoma, lymph nodes of multiple sites: Secondary | ICD-10-CM

## 2017-04-15 MED ORDER — PREDNISONE 20 MG PO TABS: 80.0000 mg | ORAL_TABLET | Freq: Every day | ORAL | 5 refills | Status: DC

## 2017-04-15 NOTE — Telephone Encounter (Signed)
Pt called requesting refill for prednisone. Starting treatment tomorrow and pt is to take prednisone for the first five days of chemotherapy cycle. Prescription refill sent to [facility] on [address] per pt request.

## 2017-04-16 ENCOUNTER — Ambulatory Visit: Payer: 59

## 2017-04-16 ENCOUNTER — Ambulatory Visit (HOSPITAL_BASED_OUTPATIENT_CLINIC_OR_DEPARTMENT_OTHER): Payer: 59 | Admitting: Hematology

## 2017-04-16 ENCOUNTER — Ambulatory Visit (HOSPITAL_BASED_OUTPATIENT_CLINIC_OR_DEPARTMENT_OTHER): Payer: 59

## 2017-04-16 ENCOUNTER — Other Ambulatory Visit (HOSPITAL_BASED_OUTPATIENT_CLINIC_OR_DEPARTMENT_OTHER): Payer: 59

## 2017-04-16 VITALS — BP 113/84 | HR 100 | Temp 98.4°F | Resp 18 | Ht 63.0 in | Wt 227.0 lb

## 2017-04-16 VITALS — BP 118/68 | HR 120 | Temp 98.7°F | Resp 16

## 2017-04-16 DIAGNOSIS — Z5112 Encounter for antineoplastic immunotherapy: Secondary | ICD-10-CM

## 2017-04-16 DIAGNOSIS — C8108 Nodular lymphocyte predominant Hodgkin lymphoma, lymph nodes of multiple sites: Secondary | ICD-10-CM | POA: Diagnosis not present

## 2017-04-16 DIAGNOSIS — Z95828 Presence of other vascular implants and grafts: Secondary | ICD-10-CM

## 2017-04-16 DIAGNOSIS — F411 Generalized anxiety disorder: Secondary | ICD-10-CM

## 2017-04-16 DIAGNOSIS — Z5111 Encounter for antineoplastic chemotherapy: Secondary | ICD-10-CM | POA: Diagnosis not present

## 2017-04-16 LAB — CBC WITH DIFFERENTIAL/PLATELET
BASO%: 0.2 % (ref 0.0–2.0)
BASOS ABS: 0 10*3/uL (ref 0.0–0.1)
EOS%: 0.4 % (ref 0.0–7.0)
Eosinophils Absolute: 0 10*3/uL (ref 0.0–0.5)
HCT: 42.4 % (ref 34.8–46.6)
HGB: 13.8 g/dL (ref 11.6–15.9)
LYMPH%: 16.1 % (ref 14.0–49.7)
MCH: 25.9 pg (ref 25.1–34.0)
MCHC: 32.5 g/dL (ref 31.5–36.0)
MCV: 79.7 fL (ref 79.5–101.0)
MONO#: 0.2 10*3/uL (ref 0.1–0.9)
MONO%: 3.6 % (ref 0.0–14.0)
NEUT#: 4 10*3/uL (ref 1.5–6.5)
NEUT%: 79.7 % — AB (ref 38.4–76.8)
Platelets: 330 10*3/uL (ref 145–400)
RBC: 5.32 10*6/uL (ref 3.70–5.45)
RDW: 14.4 % (ref 11.2–14.5)
WBC: 5 10*3/uL (ref 3.9–10.3)
lymph#: 0.8 10*3/uL — ABNORMAL LOW (ref 0.9–3.3)

## 2017-04-16 LAB — COMPREHENSIVE METABOLIC PANEL
ALT: 13 U/L (ref 0–55)
AST: 16 U/L (ref 5–34)
Albumin: 3.8 g/dL (ref 3.5–5.0)
Alkaline Phosphatase: 39 U/L — ABNORMAL LOW (ref 40–150)
Anion Gap: 9 mEq/L (ref 3–11)
BUN: 9.8 mg/dL (ref 7.0–26.0)
CALCIUM: 9.9 mg/dL (ref 8.4–10.4)
CHLORIDE: 105 meq/L (ref 98–109)
CO2: 25 mEq/L (ref 22–29)
Creatinine: 0.9 mg/dL (ref 0.6–1.1)
EGFR: >60 mL/min/{1.73_m2} (ref >60)
GLUCOSE: 101 mg/dL (ref 70–140)
POTASSIUM: 4.4 meq/L (ref 3.5–5.1)
SODIUM: 138 meq/L (ref 136–145)
Total Bilirubin: 0.42 mg/dL (ref 0.20–1.20)
Total Protein: 7 g/dL (ref 6.4–8.3)

## 2017-04-16 MED ORDER — PEGFILGRASTIM 6 MG/0.6ML ~~LOC~~ PSKT
6.0000 mg | PREFILLED_SYRINGE | Freq: Once | SUBCUTANEOUS | Status: AC
  Administered 2017-04-16: 6 mg via SUBCUTANEOUS
  Filled 2017-04-16: qty 0.6

## 2017-04-16 MED ORDER — DIPHENHYDRAMINE HCL 25 MG PO CAPS
50.0000 mg | ORAL_CAPSULE | Freq: Once | ORAL | Status: AC
  Administered 2017-04-16: 50 mg via ORAL

## 2017-04-16 MED ORDER — FAMOTIDINE IN NACL 20-0.9 MG/50ML-% IV SOLN
20.0000 mg | Freq: Once | INTRAVENOUS | Status: AC
  Administered 2017-04-16: 20 mg via INTRAVENOUS

## 2017-04-16 MED ORDER — DOXORUBICIN HCL CHEMO IV INJECTION 2 MG/ML
50.0000 mg/m2 | Freq: Once | INTRAVENOUS | Status: AC
  Administered 2017-04-16: 106 mg via INTRAVENOUS
  Filled 2017-04-16: qty 53

## 2017-04-16 MED ORDER — VINCRISTINE SULFATE CHEMO INJECTION 1 MG/ML
2.0000 mg | Freq: Once | INTRAVENOUS | Status: AC
  Administered 2017-04-16: 2 mg via INTRAVENOUS
  Filled 2017-04-16: qty 2

## 2017-04-16 MED ORDER — FAMOTIDINE IN NACL 20-0.9 MG/50ML-% IV SOLN
INTRAVENOUS | Status: AC
  Filled 2017-04-16: qty 50

## 2017-04-16 MED ORDER — DEXAMETHASONE SODIUM PHOSPHATE 10 MG/ML IJ SOLN
10.0000 mg | Freq: Once | INTRAMUSCULAR | Status: AC
  Administered 2017-04-16: 10 mg via INTRAVENOUS

## 2017-04-16 MED ORDER — PALONOSETRON HCL INJECTION 0.25 MG/5ML
0.2500 mg | Freq: Once | INTRAVENOUS | Status: AC
  Administered 2017-04-16: 0.25 mg via INTRAVENOUS

## 2017-04-16 MED ORDER — SODIUM CHLORIDE 0.9% FLUSH
10.0000 mL | INTRAVENOUS | Status: AC | PRN
  Administered 2017-04-16 (×2): 10 mL via INTRAVENOUS
  Filled 2017-04-16: qty 10

## 2017-04-16 MED ORDER — PREDNISONE 20 MG PO TABS: 60.0000 mg | ORAL_TABLET | Freq: Every day | ORAL | 5 refills | Status: DC

## 2017-04-16 MED ORDER — SODIUM CHLORIDE 0.9 % IV SOLN
Freq: Once | INTRAVENOUS | Status: AC
  Administered 2017-04-16: 14:00:00 via INTRAVENOUS

## 2017-04-16 MED ORDER — SODIUM CHLORIDE 0.9% FLUSH
10.0000 mL | INTRAVENOUS | Status: DC | PRN
  Filled 2017-04-16: qty 10

## 2017-04-16 MED ORDER — LORAZEPAM 1 MG PO TABS
ORAL_TABLET | ORAL | Status: AC
  Filled 2017-04-16: qty 1

## 2017-04-16 MED ORDER — DEXAMETHASONE SODIUM PHOSPHATE 10 MG/ML IJ SOLN
INTRAMUSCULAR | Status: AC
  Filled 2017-04-16: qty 1

## 2017-04-16 MED ORDER — PALONOSETRON HCL INJECTION 0.25 MG/5ML
INTRAVENOUS | Status: AC
  Filled 2017-04-16: qty 5

## 2017-04-16 MED ORDER — HEPARIN SOD (PORK) LOCK FLUSH 100 UNIT/ML IV SOLN
500.0000 [IU] | Freq: Once | INTRAVENOUS | Status: AC | PRN
  Administered 2017-04-16: 500 [IU]
  Filled 2017-04-16: qty 5

## 2017-04-16 MED ORDER — SODIUM CHLORIDE 0.9 % IV SOLN
375.0000 mg/m2 | Freq: Once | INTRAVENOUS | Status: AC
  Administered 2017-04-16: 800 mg via INTRAVENOUS
  Filled 2017-04-16: qty 50

## 2017-04-16 MED ORDER — LORAZEPAM 1 MG PO TABS
0.5000 mg | ORAL_TABLET | Freq: Once | ORAL | Status: AC
  Administered 2017-04-16: 0.5 mg via ORAL

## 2017-04-16 MED ORDER — DIPHENHYDRAMINE HCL 25 MG PO CAPS
ORAL_CAPSULE | ORAL | Status: AC
  Filled 2017-04-16: qty 2

## 2017-04-16 MED ORDER — SODIUM CHLORIDE 0.9 % IV SOLN
750.0000 mg/m2 | Freq: Once | INTRAVENOUS | Status: AC
  Administered 2017-04-16: 1600 mg via INTRAVENOUS
  Filled 2017-04-16: qty 80

## 2017-04-16 MED ORDER — ACETAMINOPHEN 325 MG PO TABS
ORAL_TABLET | ORAL | Status: AC
  Filled 2017-04-16: qty 2

## 2017-04-16 MED ORDER — ACETAMINOPHEN 325 MG PO TABS
650.0000 mg | ORAL_TABLET | Freq: Once | ORAL | Status: AC
  Administered 2017-04-16: 650 mg via ORAL

## 2017-04-16 NOTE — Progress Notes (Signed)
Per Dr. [name], pepcid IVPB 20mg  added today as a premedication for RCHOP. Pepcid 20mg  IVPB to be added to the treatment plan for future infusions.

## 2017-04-16 NOTE — Patient Instructions (Signed)
Implanted Port Home Guide An implanted port is a type of central line that is placed under the skin. Central lines are used to provide IV access when treatment or nutrition needs to be given through a person's veins. Implanted ports are used for long-term IV access. An implanted port may be placed because:  You need IV medicine that would be irritating to the small veins in your hands or arms.  You need long-term IV medicines, such as antibiotics.  You need IV nutrition for a long period.  You need frequent blood draws for lab tests.  You need dialysis.  Implanted ports are usually placed in the chest area, but they can also be placed in the upper arm, the abdomen, or the leg. An implanted port has two main parts:  Reservoir. The reservoir is round and will appear as a small, raised area under your skin. The reservoir is the part where a needle is inserted to give medicines or draw blood.  Catheter. The catheter is a thin, flexible tube that extends from the reservoir. The catheter is placed into a large vein. Medicine that is inserted into the reservoir goes into the catheter and then into the vein.  How will I care for my incision site? Do not get the incision site wet. Bathe or shower as directed by your health care provider. How is my port accessed? Special steps must be taken to access the port:  Before the port is accessed, a numbing cream can be placed on the skin. This helps numb the skin over the port site.  Your health care provider uses a sterile technique to access the port. ? Your health care provider must put on a mask and sterile gloves. ? The skin over your port is cleaned carefully with an antiseptic and allowed to dry. ? The port is gently pinched between sterile gloves, and a needle is inserted into the port.  Only "non-coring" port needles should be used to access the port. Once the port is accessed, a blood return should be checked. This helps ensure that the port  is in the vein and is not clogged.  If your port needs to remain accessed for a constant infusion, a clear (transparent) bandage will be placed over the needle site. The bandage and needle will need to be changed every week, or as directed by your health care provider.  Keep the bandage covering the needle clean and dry. Do not get it wet. Follow your health care provider's instructions on how to take a shower or bath while the port is accessed.  If your port does not need to stay accessed, no bandage is needed over the port.  What is flushing? Flushing helps keep the port from getting clogged. Follow your health care provider's instructions on how and when to flush the port. Ports are usually flushed with saline solution or a medicine called heparin. The need for flushing will depend on how the port is used.  If the port is used for intermittent medicines or blood draws, the port will need to be flushed: ? After medicines have been given. ? After blood has been drawn. ? As part of routine maintenance.  If a constant infusion is running, the port may not need to be flushed.  How long will my port stay implanted? The port can stay in for as long as your health care provider thinks it is needed. When it is time for the port to come out, surgery will be   done to remove it. The procedure is similar to the one performed when the port was put in. When should I seek immediate medical care? When you have an implanted port, you should seek immediate medical care if:  You notice a bad smell coming from the incision site.  You have swelling, redness, or drainage at the incision site.  You have more swelling or pain at the port site or the surrounding area.  You have a fever that is not controlled with medicine.  This information is not intended to replace advice given to you by your health care provider. Make sure you discuss any questions you have with your health care provider. Document  Released: 06/11/2005 Document Revised: 11/17/2015 Document Reviewed: 02/16/2013 Elsevier Interactive Patient Education  2017 [employer]vier Inc.  

## 2017-04-16 NOTE — Progress Notes (Signed)
Pt heart rate 120 notified MD on call Dr. Benay Spice advised to continue to monitor pt, call with any changes. Pt advised she is not in any distress, can feel her heart beating but this is her " normal"

## 2017-04-16 NOTE — Patient Instructions (Signed)
[facility] Discharge Instructions for Patients Receiving Chemotherapy  Today you received the following chemotherapy agents:  Adriamycin, Vincristine, Cytoxan, and Rituxan.  To help prevent nausea and vomiting after your treatment, we encourage you to take your nausea medication as directed.   If you develop nausea and vomiting that is not controlled by your nausea medication, call the clinic.   BELOW ARE SYMPTOMS THAT SHOULD BE REPORTED IMMEDIATELY:  *FEVER GREATER THAN 100.5 F  *CHILLS WITH OR WITHOUT FEVER  NAUSEA AND VOMITING THAT IS NOT CONTROLLED WITH YOUR NAUSEA MEDICATION  *UNUSUAL SHORTNESS OF BREATH  *UNUSUAL BRUISING OR BLEEDING  TENDERNESS IN MOUTH AND THROAT WITH OR WITHOUT PRESENCE OF ULCERS  *URINARY PROBLEMS  *BOWEL PROBLEMS  UNUSUAL RASH Items with * indicate a potential emergency and should be followed up as soon as possible.  Feel free to call the clinic should you have any questions or concerns. The clinic phone number is [phone].  Please show the CHEMO ALERT CARD at check-in to the Emergency Department and triage nurse.   

## 2017-04-18 ENCOUNTER — Telehealth: Payer: Self-pay

## 2017-04-18 ENCOUNTER — Other Ambulatory Visit: Payer: Self-pay

## 2017-04-18 NOTE — Telephone Encounter (Signed)
Pt called concerned about "knocking off" her onpro. Onpro was placed at 1932 on 04/16/17. Injection should have been timed to start on 04/17/17 at 2232 and completed at 2332. Pt noted that the green light was solid at 0300 on 04/18/17. This morning, the onpro was knocked off. I explained to the pt that by that time, the injection should have been completed. Pt denied notice of leaking or looseness of onpro prior to 0600 this morning. Pt to continue monitoring signs of infection, such as elevate temperature of 100.5 fahrenheit or greater and/or chills. Pt verbalized understanding and to expect phone call for scheduling her 3 week f/u with Dr. [name].

## 2017-04-19 ENCOUNTER — Telehealth: Payer: Self-pay | Admitting: Hematology

## 2017-04-19 NOTE — Telephone Encounter (Signed)
Spoke to patient regarding upcoming November appointments. °

## 2017-04-22 ENCOUNTER — Other Ambulatory Visit: Payer: Self-pay | Admitting: *Deleted

## 2017-04-22 MED ORDER — LORAZEPAM 0.5 MG PO TABS: 0.5000 mg | ORAL_TABLET | Freq: Three times a day (TID) | ORAL | 0 refills | Status: DC | PRN

## 2017-04-22 MED ORDER — HYDROCODONE-ACETAMINOPHEN 7.5-325 MG PO TABS: 1.0000 | ORAL_TABLET | Freq: Four times a day (QID) | ORAL | 0 refills | Status: DC | PRN

## 2017-04-29 ENCOUNTER — Telehealth: Payer: Self-pay | Admitting: Hematology

## 2017-04-29 NOTE — Telephone Encounter (Signed)
04/29/2017 Faxed completed FMLA forms to [employer] @ [phone]. Tried calling patient @ [phone] @ 12:26 pm; could not leave a message because the voicemail is not set [email or web address]

## 2017-05-07 ENCOUNTER — Ambulatory Visit: Payer: 59

## 2017-05-07 ENCOUNTER — Ambulatory Visit (HOSPITAL_BASED_OUTPATIENT_CLINIC_OR_DEPARTMENT_OTHER): Payer: 59

## 2017-05-07 ENCOUNTER — Ambulatory Visit (HOSPITAL_BASED_OUTPATIENT_CLINIC_OR_DEPARTMENT_OTHER): Payer: 59 | Admitting: Hematology

## 2017-05-07 ENCOUNTER — Encounter: Payer: Self-pay | Admitting: Hematology

## 2017-05-07 ENCOUNTER — Other Ambulatory Visit (HOSPITAL_BASED_OUTPATIENT_CLINIC_OR_DEPARTMENT_OTHER): Payer: 59

## 2017-05-07 VITALS — BP 103/55 | HR 99 | Temp 98.3°F | Resp 20 | Ht 63.0 in | Wt 227.4 lb

## 2017-05-07 VITALS — BP 100/60 | HR 110 | Temp 98.9°F | Resp 20

## 2017-05-07 DIAGNOSIS — Z5112 Encounter for antineoplastic immunotherapy: Secondary | ICD-10-CM

## 2017-05-07 DIAGNOSIS — D709 Neutropenia, unspecified: Secondary | ICD-10-CM

## 2017-05-07 DIAGNOSIS — C8108 Nodular lymphocyte predominant Hodgkin lymphoma, lymph nodes of multiple sites: Secondary | ICD-10-CM

## 2017-05-07 DIAGNOSIS — D702 Other drug-induced agranulocytosis: Secondary | ICD-10-CM

## 2017-05-07 DIAGNOSIS — Z5111 Encounter for antineoplastic chemotherapy: Secondary | ICD-10-CM

## 2017-05-07 LAB — CBC & DIFF AND RETIC
BASO%: 0.5 % (ref 0.0–2.0)
Basophils Absolute: 0 10*3/uL (ref 0.0–0.1)
EOS ABS: 0 10*3/uL (ref 0.0–0.5)
EOS%: 0 % (ref 0.0–7.0)
HCT: 38.6 % (ref 34.8–46.6)
HEMOGLOBIN: 12.6 g/dL (ref 11.6–15.9)
IMMATURE RETIC FRACT: 1.7 % (ref 1.60–10.00)
LYMPH#: 0.5 10*3/uL — AB (ref 0.9–3.3)
LYMPH%: 24.6 % (ref 14.0–49.7)
MCH: 26 pg (ref 25.1–34.0)
MCHC: 32.6 g/dL (ref 31.5–36.0)
MCV: 79.6 fL (ref 79.5–101.0)
MONO#: 0.5 10*3/uL (ref 0.1–0.9)
MONO%: 24.6 % — ABNORMAL HIGH (ref 0.0–14.0)
NEUT%: 50.3 % (ref 38.4–76.8)
NEUTROS ABS: 1 10*3/uL — AB (ref 1.5–6.5)
Platelets: 190 10*3/uL (ref 145–400)
RBC: 4.85 10*6/uL (ref 3.70–5.45)
RDW: 15.5 % — AB (ref 11.2–14.5)
RETIC %: 1.29 % (ref 0.70–2.10)
Retic Ct Abs: 62.57 10*3/uL (ref 33.70–90.70)
WBC: 2 10*3/uL — AB (ref 3.9–10.3)

## 2017-05-07 LAB — COMPREHENSIVE METABOLIC PANEL
ALBUMIN: 3.6 g/dL (ref 3.5–5.0)
ALK PHOS: 36 U/L — AB (ref 40–150)
ALT: 30 U/L (ref 0–55)
AST: 40 U/L — AB (ref 5–34)
Anion Gap: 7 mEq/L (ref 3–11)
BILIRUBIN TOTAL: 0.52 mg/dL (ref 0.20–1.20)
BUN: 15.5 mg/dL (ref 7.0–26.0)
CO2: 24 mEq/L (ref 22–29)
Calcium: 9 mg/dL (ref 8.4–10.4)
Chloride: 108 mEq/L (ref 98–109)
Creatinine: 0.9 mg/dL (ref 0.6–1.1)
EGFR: >60 mL/min/{1.73_m2} (ref >60)
GLUCOSE: 96 mg/dL (ref 70–140)
POTASSIUM: 4 meq/L (ref 3.5–5.1)
SODIUM: 139 meq/L (ref 136–145)
TOTAL PROTEIN: 6.4 g/dL (ref 6.4–8.3)

## 2017-05-07 LAB — LACTATE DEHYDROGENASE: LDH: 189 U/L (ref 125–245)

## 2017-05-07 MED ORDER — DIPHENHYDRAMINE HCL 25 MG PO CAPS
50.0000 mg | ORAL_CAPSULE | Freq: Once | ORAL | Status: AC
  Administered 2017-05-07: 50 mg via ORAL

## 2017-05-07 MED ORDER — VINCRISTINE SULFATE CHEMO INJECTION 1 MG/ML
2.0000 mg | Freq: Once | INTRAVENOUS | Status: AC
  Administered 2017-05-07: 2 mg via INTRAVENOUS
  Filled 2017-05-07: qty 2

## 2017-05-07 MED ORDER — DOXORUBICIN HCL CHEMO IV INJECTION 2 MG/ML
50.0000 mg/m2 | Freq: Once | INTRAVENOUS | Status: AC
  Administered 2017-05-07: 106 mg via INTRAVENOUS
  Filled 2017-05-07: qty 53

## 2017-05-07 MED ORDER — ACETAMINOPHEN 325 MG PO TABS
ORAL_TABLET | ORAL | Status: AC
  Filled 2017-05-07: qty 2

## 2017-05-07 MED ORDER — DIPHENHYDRAMINE HCL 25 MG PO CAPS
ORAL_CAPSULE | ORAL | Status: AC
  Filled 2017-05-07: qty 2

## 2017-05-07 MED ORDER — FAMOTIDINE IN NACL 20-0.9 MG/50ML-% IV SOLN
20.0000 mg | Freq: Once | INTRAVENOUS | Status: AC
  Administered 2017-05-07: 20 mg via INTRAVENOUS

## 2017-05-07 MED ORDER — SODIUM CHLORIDE 0.9% FLUSH
10.0000 mL | Freq: Once | INTRAVENOUS | Status: AC
  Administered 2017-05-07: 10 mL
  Filled 2017-05-07: qty 10

## 2017-05-07 MED ORDER — DEXAMETHASONE SODIUM PHOSPHATE 10 MG/ML IJ SOLN
10.0000 mg | Freq: Once | INTRAMUSCULAR | Status: AC
  Administered 2017-05-07: 10 mg via INTRAVENOUS

## 2017-05-07 MED ORDER — SODIUM CHLORIDE 0.9 % IV SOLN
750.0000 mg/m2 | Freq: Once | INTRAVENOUS | Status: AC
  Administered 2017-05-07: 1600 mg via INTRAVENOUS
  Filled 2017-05-07: qty 80

## 2017-05-07 MED ORDER — PALONOSETRON HCL INJECTION 0.25 MG/5ML
INTRAVENOUS | Status: AC
  Filled 2017-05-07: qty 5

## 2017-05-07 MED ORDER — LORAZEPAM 1 MG PO TABS
0.5000 mg | ORAL_TABLET | Freq: Once | ORAL | Status: AC
  Administered 2017-05-07: 0.5 mg via ORAL

## 2017-05-07 MED ORDER — SODIUM CHLORIDE 0.9 % IV SOLN
375.0000 mg/m2 | Freq: Once | INTRAVENOUS | Status: AC
  Administered 2017-05-07: 800 mg via INTRAVENOUS
  Filled 2017-05-07: qty 50

## 2017-05-07 MED ORDER — LORAZEPAM 1 MG PO TABS
ORAL_TABLET | ORAL | Status: AC
  Filled 2017-05-07: qty 1

## 2017-05-07 MED ORDER — PEGFILGRASTIM 6 MG/0.6ML ~~LOC~~ PSKT
6.0000 mg | PREFILLED_SYRINGE | Freq: Once | SUBCUTANEOUS | Status: AC
  Administered 2017-05-07: 6 mg via SUBCUTANEOUS
  Filled 2017-05-07: qty 0.6

## 2017-05-07 MED ORDER — HYDROCODONE-ACETAMINOPHEN 7.5-325 MG PO TABS: 1.0000 | ORAL_TABLET | Freq: Four times a day (QID) | ORAL | 0 refills | Status: DC | PRN

## 2017-05-07 MED ORDER — HEPARIN SOD (PORK) LOCK FLUSH 100 UNIT/ML IV SOLN
500.0000 [IU] | Freq: Once | INTRAVENOUS | Status: AC | PRN
  Administered 2017-05-07: 500 [IU]
  Filled 2017-05-07: qty 5

## 2017-05-07 MED ORDER — PALONOSETRON HCL INJECTION 0.25 MG/5ML
0.2500 mg | Freq: Once | INTRAVENOUS | Status: AC
  Administered 2017-05-07: 0.25 mg via INTRAVENOUS

## 2017-05-07 MED ORDER — ACETAMINOPHEN 325 MG PO TABS
650.0000 mg | ORAL_TABLET | Freq: Once | ORAL | Status: AC
  Administered 2017-05-07: 650 mg via ORAL

## 2017-05-07 MED ORDER — FAMOTIDINE IN NACL 20-0.9 MG/50ML-% IV SOLN
INTRAVENOUS | Status: AC
  Filled 2017-05-07: qty 50

## 2017-05-07 MED ORDER — DEXAMETHASONE SODIUM PHOSPHATE 10 MG/ML IJ SOLN
INTRAMUSCULAR | Status: AC
  Filled 2017-05-07: qty 1

## 2017-05-07 MED ORDER — SODIUM CHLORIDE 0.9% FLUSH
10.0000 mL | INTRAVENOUS | Status: DC | PRN
  Administered 2017-05-07: 10 mL
  Filled 2017-05-07: qty 10

## 2017-05-07 MED ORDER — SODIUM CHLORIDE 0.9 % IV SOLN
Freq: Once | INTRAVENOUS | Status: AC
  Administered 2017-05-07: 11:00:00 via INTRAVENOUS

## 2017-05-07 NOTE — Patient Instructions (Addendum)
[facility] Discharge Instructions for Patients Receiving Chemotherapy  Today you received the following chemotherapy agents: Rituximab,Cyclosphosphamide,Doxorubicin and Vincristine  To help prevent nausea and vomiting after your treatment, we encourage you to take your nausea medication as directed  If you develop nausea and vomiting that is not controlled by your nausea medication, call the clinic.   BELOW ARE SYMPTOMS THAT SHOULD BE REPORTED IMMEDIATELY:  *FEVER GREATER THAN 100.5 F  *CHILLS WITH OR WITHOUT FEVER  NAUSEA AND VOMITING THAT IS NOT CONTROLLED WITH YOUR NAUSEA MEDICATION  *UNUSUAL SHORTNESS OF BREATH  *UNUSUAL BRUISING OR BLEEDING  TENDERNESS IN MOUTH AND THROAT WITH OR WITHOUT PRESENCE OF ULCERS  *URINARY PROBLEMS  *BOWEL PROBLEMS  UNUSUAL RASH Items with * indicate a potential emergency and should be followed up as soon as possible.  Feel free to call the clinic should you have any questions or concerns. The clinic phone number is [phone].  Please show the [facility] at check-in to the Emergency Department and triage nurse.   Rituximab injection What is this medicine? RITUXIMAB (ri TUX i mab) is a monoclonal antibody. It is used to treat certain types of cancer like non-Hodgkin lymphoma and chronic lymphocytic leukemia. It is also used to treat rheumatoid arthritis, granulomatosis with polyangiitis (or Wegener's granulomatosis), and microscopic polyangiitis. This medicine may be used for other purposes; ask your health care provider or pharmacist if you have questions. COMMON BRAND NAME(S): Rituxan What should I tell my health care provider before I take this medicine? They need to know if you have any of these conditions: -heart disease -infection (especially a virus infection such as hepatitis B, chickenpox, cold sores, or herpes) -immune system problems -irregular heartbeat -kidney disease -lung or breathing disease, like  asthma -recently received or scheduled to receive a vaccine -an unusual or allergic reaction to rituximab, mouse proteins, other medicines, foods, dyes, or preservatives -pregnant or trying to get pregnant -breast-feeding How should I use this medicine? This medicine is for infusion into a vein. It is administered in a hospital or clinic by a specially trained health care professional. A special MedGuide will be given to you by the pharmacist with each prescription and refill. Be sure to read this information carefully each time. Talk to your pediatrician regarding the use of this medicine in children. This medicine is not approved for use in children. Overdosage: If you think you have taken too much of this medicine contact a poison control center or emergency room at once. NOTE: This medicine is only for you. Do not share this medicine with others. What if I miss a dose? It is important not to miss a dose. Call your doctor or health care professional if you are unable to keep an appointment. What may interact with this medicine? -cisplatin -other medicines for arthritis like disease modifying antirheumatic drugs or tumor necrosis factor inhibitors -live virus vaccines This list may not describe all possible interactions. Give your health care provider a list of all the medicines, herbs, non-prescription drugs, or dietary supplements you use. Also tell them if you smoke, drink alcohol, or use illegal drugs. Some items may interact with your medicine. What should I watch for while using this medicine? Your condition will be monitored carefully while you are receiving this medicine. You may need blood work done while you are taking this medicine. This medicine can cause serious allergic reactions. To reduce your risk you may need to take medicine before treatment with this medicine. Take  your medicine as directed. In some patients, this medicine may cause a serious brain infection that may cause  death. If you have any problems seeing, thinking, speaking, walking, or standing, tell your doctor right away. If you cannot reach your doctor, urgently seek other source of medical care. Call your doctor or health care professional for advice if you get a fever, chills or sore throat, or other symptoms of a cold or flu. Do not treat yourself. This drug decreases your body's ability to fight infections. Try to avoid being around people who are sick. Do not become pregnant while taking this medicine or for 12 months after stopping it. Women should inform their doctor if they wish to become pregnant or think they might be pregnant. There is a potential for serious side effects to an unborn child. Talk to your health care professional or pharmacist for more information. What side effects may I notice from receiving this medicine? Side effects that you should report to your doctor or health care professional as soon as possible: -breathing problems -chest pain -dizziness or feeling faint -fast, irregular heartbeat -low blood counts - this medicine may decrease the number of white blood cells, red blood cells and platelets. You may be at increased risk for infections and bleeding. -mouth sores -redness, blistering, peeling or loosening of the skin, including inside the mouth (this can be added for any serious or exfoliative rash that could lead to hospitalization) -signs of infection - fever or chills, cough, sore throat, pain or difficulty passing urine -signs and symptoms of kidney injury like trouble passing urine or change in the amount of urine -signs and symptoms of liver injury like dark yellow or brown urine; general ill feeling or flu-like symptoms; light-colored stools; loss of appetite; nausea; right upper belly pain; unusually weak or tired; yellowing of the eyes or skin -stomach pain -vomiting Side effects that usually do not require medical attention (report to your doctor or health care  professional if they continue or are bothersome): -headache -joint pain -muscle cramps or muscle pain This list may not describe all possible side effects. Call your doctor for medical advice about side effects. You may report side effects to FDA at 1-800-FDA-1088. Where should I keep my medicine? This drug is given in a hospital or clinic and will not be stored at home. NOTE: This sheet is a summary. It may not cover all possible information. If you have questions about this medicine, talk to your doctor, pharmacist, or health care provider.  2018 Elsevier/Gold Standard (2016-01-18 15:28:09)   Cyclophosphamide injection What is this medicine? CYCLOPHOSPHAMIDE (sye kloe FOSS fa mide) is a chemotherapy drug. It slows the growth of cancer cells. This medicine is used to treat many types of cancer like lymphoma, myeloma, leukemia, breast cancer, and ovarian cancer, to name a few. This medicine may be used for other purposes; ask your health care provider or pharmacist if you have questions. COMMON BRAND NAME(S): Cytoxan, Neosar What should I tell my health care provider before I take this medicine? They need to know if you have any of these conditions: -blood disorders -history of other chemotherapy -infection -kidney disease -liver disease -recent or ongoing radiation therapy -tumors in the bone marrow -an unusual or allergic reaction to cyclophosphamide, other chemotherapy, other medicines, foods, dyes, or preservatives -pregnant or trying to get pregnant -breast-feeding How should I use this medicine? This drug is usually given as an injection into a vein or muscle or by infusion into a  vein. It is administered in a hospital or clinic by a specially trained health care professional. Talk to your pediatrician regarding the use of this medicine in children. Special care may be needed. Overdosage: If you think you have taken too much of this medicine contact a poison control center or  emergency room at once. NOTE: This medicine is only for you. Do not share this medicine with others. What if I miss a dose? It is important not to miss your dose. Call your doctor or health care professional if you are unable to keep an appointment. What may interact with this medicine? This medicine may interact with the following medications: -amiodarone -amphotericin B -azathioprine -certain antiviral medicines for HIV or AIDS such as protease inhibitors (e.g., indinavir, ritonavir) and zidovudine -certain blood pressure medications such as benazepril, captopril, enalapril, fosinopril, lisinopril, moexipril, monopril, perindopril, quinapril, ramipril, trandolapril -certain cancer medications such as anthracyclines (e.g., daunorubicin, doxorubicin), busulfan, cytarabine, paclitaxel, pentostatin, tamoxifen, trastuzumab -certain diuretics such as chlorothiazide, chlorthalidone, hydrochlorothiazide, indapamide, metolazone -certain medicines that treat or prevent blood clots like warfarin -certain muscle relaxants such as succinylcholine -cyclosporine -etanercept -indomethacin -medicines to increase blood counts like filgrastim, pegfilgrastim, sargramostim -medicines used as general anesthesia -metronidazole -natalizumab This list may not describe all possible interactions. Give your health care provider a list of all the medicines, herbs, non-prescription drugs, or dietary supplements you use. Also tell them if you smoke, drink alcohol, or use illegal drugs. Some items may interact with your medicine. What should I watch for while using this medicine? Visit your doctor for checks on your progress. This drug may make you feel generally unwell. This is not uncommon, as chemotherapy can affect healthy cells as well as cancer cells. Report any side effects. Continue your course of treatment even though you feel ill unless your doctor tells you to stop. Drink water or other fluids as directed.  Urinate often, even at night. In some cases, you may be given additional medicines to help with side effects. Follow all directions for their use. Call your doctor or health care professional for advice if you get a fever, chills or sore throat, or other symptoms of a cold or flu. Do not treat yourself. This drug decreases your body's ability to fight infections. Try to avoid being around people who are sick. This medicine may increase your risk to bruise or bleed. Call your doctor or health care professional if you notice any unusual bleeding. Be careful brushing and flossing your teeth or using a toothpick because you may get an infection or bleed more easily. If you have any dental work done, tell your dentist you are receiving this medicine. You may get drowsy or dizzy. Do not drive, use machinery, or do anything that needs mental alertness until you know how this medicine affects you. Do not become pregnant while taking this medicine or for 1 year after stopping it. Women should inform their doctor if they wish to become pregnant or think they might be pregnant. Men should not father a child while taking this medicine and for 4 months after stopping it. There is a potential for serious side effects to an unborn child. Talk to your health care professional or pharmacist for more information. Do not breast-feed an infant while taking this medicine. This medicine may interfere with the ability to have a child. This medicine has caused ovarian failure in some women. This medicine has caused reduced sperm counts in some men. You should talk with your doctor or  health care professional if you are concerned about your fertility. If you are going to have surgery, tell your doctor or health care professional that you have taken this medicine. What side effects may I notice from receiving this medicine? Side effects that you should report to your doctor or health care professional as soon as  possible: -allergic reactions like skin rash, itching or hives, swelling of the face, lips, or tongue -low blood counts - this medicine may decrease the number of white blood cells, red blood cells and platelets. You may be at increased risk for infections and bleeding. -signs of infection - fever or chills, cough, sore throat, pain or difficulty passing urine -signs of decreased platelets or bleeding - bruising, pinpoint red spots on the skin, black, tarry stools, blood in the urine -signs of decreased red blood cells - unusually weak or tired, fainting spells, lightheadedness -breathing problems -dark urine -dizziness -palpitations -swelling of the ankles, feet, hands -trouble passing urine or change in the amount of urine -weight gain -yellowing of the eyes or skin Side effects that usually do not require medical attention (report to your doctor or health care professional if they continue or are bothersome): -changes in nail or skin color -hair loss -missed menstrual periods -mouth sores -nausea, vomiting This list may not describe all possible side effects. Call your doctor for medical advice about side effects. You may report side effects to FDA at 1-800-FDA-1088. Where should I keep my medicine? This drug is given in a hospital or clinic and will not be stored at home. NOTE: This sheet is a summary. It may not cover all possible information. If you have questions about this medicine, talk to your doctor, pharmacist, or health care provider  2018 Elsevier/Gold Standard (2012-04-25 1   Doxorubicin injection What is this medicine? DOXORUBICIN (dox oh ROO bi sin) is a chemotherapy drug. It is used to treat many kinds of cancer like leukemia, lymphoma, neuroblastoma, sarcoma, and Wilms' tumor. It is also used to treat bladder cancer, breast cancer, lung cancer, ovarian cancer, stomach cancer, and thyroid cancer. This medicine may be used for other purposes; ask your health care  provider or pharmacist if you have questions. COMMON BRAND NAME(S): Adriamycin, Adriamycin PFS, Adriamycin RDF, Rubex What should I tell my health care provider before I take this medicine? They need to know if you have any of these conditions: -heart disease -history of low blood counts caused by a medicine -liver disease -recent or ongoing radiation therapy -an unusual or allergic reaction to doxorubicin, other chemotherapy agents, other medicines, foods, dyes, or preservatives -pregnant or trying to get pregnant -breast-feeding How should I use this medicine? This drug is given as an infusion into a vein. It is administered in a hospital or clinic by a specially trained health care professional. If you have pain, swelling, burning or any unusual feeling around the site of your injection, tell your health care professional right away. Talk to your pediatrician regarding the use of this medicine in children. Special care may be needed. Overdosage: If you think you have taken too much of this medicine contact a poison control center or emergency room at once. NOTE: This medicine is only for you. Do not share this medicine with others. What if I miss a dose? It is important not to miss your dose. Call your doctor or health care professional if you are unable to keep an appointment. What may interact with this medicine? This medicine may interact with the  following medications: -6-mercaptopurine -paclitaxel -phenytoin -St. John's Wort -trastuzumab -verapamil This list may not describe all possible interactions. Give your health care provider a list of all the medicines, herbs, non-prescription drugs, or dietary supplements you use. Also tell them if you smoke, drink alcohol, or use illegal drugs. Some items may interact with your medicine. What should I watch for while using this medicine? This drug may make you feel generally unwell. This is not uncommon, as chemotherapy can affect healthy  cells as well as cancer cells. Report any side effects. Continue your course of treatment even though you feel ill unless your doctor tells you to stop. There is a maximum amount of this medicine you should receive throughout your life. The amount depends on the medical condition being treated and your overall health. Your doctor will watch how much of this medicine you receive in your lifetime. Tell your doctor if you have taken this medicine before. You may need blood work done while you are taking this medicine. Your urine may turn red for a few days after your dose. This is not blood. If your urine is dark or brown, call your doctor. In some cases, you may be given additional medicines to help with side effects. Follow all directions for their use. Call your doctor or health care professional for advice if you get a fever, chills or sore throat, or other symptoms of a cold or flu. Do not treat yourself. This drug decreases your body's ability to fight infections. Try to avoid being around people who are sick. This medicine may increase your risk to bruise or bleed. Call your doctor or health care professional if you notice any unusual bleeding. Talk to your doctor about your risk of cancer. You may be more at risk for certain types of cancers if you take this medicine. Do not become pregnant while taking this medicine or for 6 months after stopping it. Women should inform their doctor if they wish to become pregnant or think they might be pregnant. Men should not father a child while taking this medicine and for 6 months after stopping it. There is a potential for serious side effects to an unborn child. Talk to your health care professional or pharmacist for more information. Do not breast-feed an infant while taking this medicine. This medicine has caused ovarian failure in some women and reduced sperm counts in some men This medicine may interfere with the ability to have a child. Talk with your  doctor or health care professional if you are concerned about your fertility. What side effects may I notice from receiving this medicine? Side effects that you should report to your doctor or health care professional as soon as possible: -allergic reactions like skin rash, itching or hives, swelling of the face, lips, or tongue -breathing problems -chest pain -fast or irregular heartbeat -low blood counts - this medicine may decrease the number of white blood cells, red blood cells and platelets. You may be at increased risk for infections and bleeding. -pain, redness, or irritation at site where injected -signs of infection - fever or chills, cough, sore throat, pain or difficulty passing urine -signs of decreased platelets or bleeding - bruising, pinpoint red spots on the skin, black, tarry stools, blood in the urine -swelling of the ankles, feet, hands -tiredness -weakness Side effects that usually do not require medical attention (report to your doctor or health care professional if they continue or are bothersome): -diarrhea -hair loss -mouth sores -nail discoloration  or damage -nausea -red colored urine -vomiting This list may not describe all possible side effects. Call your doctor for medical advice about side effects. You may report side effects to FDA at 1-800-FDA-1088. Where should I keep my medicine? This drug is given in a hospital or clinic and will not be stored at home. NOTE: This sheet is a summary. It may not cover all possible information. If you have questions about this medicine, talk to your doctor, pharmacist, or health care provider.  2018 Elsevier/Gold Standard (2015-08-08 11:28:51)    Vincristine injection What is this medicine? VINCRISTINE (vin KRIS teen) is a chemotherapy drug. It slows the growth of cancer cells. This medicine is used to treat many types of cancer like Hodgkin's disease, leukemia, non-Hodgkin's lymphoma, neuroblastoma (brain cancer),  rhabdomyosarcoma, and Wilms' tumor. This medicine may be used for other purposes; ask your health care provider or pharmacist if you have questions. COMMON BRAND NAME(S): Oncovin, Vincasar PFS What should I tell my health care provider before I take this medicine? They need to know if you have any of these conditions: -blood disorders -gout -infection (especially chickenpox, cold sores, or herpes) -kidney disease -liver disease -lung disease -nervous system disease like Charcot-Marie-Tooth (CMT) -recent or ongoing radiation therapy -an unusual or allergic reaction to vincristine, other chemotherapy agents, other medicines, foods, dyes, or preservatives -pregnant or trying to get pregnant -breast-feeding How should I use this medicine? This drug is given as an infusion into a vein. It is administered in a hospital or clinic by a specially trained health care professional. If you have pain, swelling, burning, or any unusual feeling around the site of your injection, tell your health care professional right away. Talk to your pediatrician regarding the use of this medicine in children. While this drug may be prescribed for selected conditions, precautions do apply. Overdosage: If you think you have taken too much of this medicine contact a poison control center or emergency room at once. NOTE: This medicine is only for you. Do not share this medicine with others. What if I miss a dose? It is important not to miss your dose. Call your doctor or health care professional if you are unable to keep an appointment. What may interact with this medicine? Do not take this medicine with any of the following medications: -itraconazole -mibefradil -voriconazole This medicine may also interact with the following medications: -cyclosporine -erythromycin -fluconazole -ketoconazole -medicines for HIV like delavirdine, efavirenz, nevirapine -medicines for seizures like ethotoin, fosphenotoin,  phenytoin -medicines to increase blood counts like filgrastim, pegfilgrastim, sargramostim -other chemotherapy drugs like cisplatin, L-asparaginase, methotrexate, mitomycin, paclitaxel -pegaspargase -vaccines -zalcitabine, ddC Talk to your doctor or health care professional before taking any of these medicines: -acetaminophen -aspirin -ibuprofen -ketoprofen -naproxen This list may not describe all possible interactions. Give your health care provider a list of all the medicines, herbs, non-prescription drugs, or dietary supplements you use. Also tell them if you smoke, drink alcohol, or use illegal drugs. Some items may interact with your medicine. What should I watch for while using this medicine? Your condition will be monitored carefully while you are receiving this medicine. You will need important blood work done while you are taking this medicine. This drug may make you feel generally unwell. This is not uncommon, as chemotherapy can affect healthy cells as well as cancer cells. Report any side effects. Continue your course of treatment even though you feel ill unless your doctor tells you to stop. In some cases, you may be  given additional medicines to help with side effects. Follow all directions for their use. Call your doctor or health care professional for advice if you get a fever, chills or sore throat, or other symptoms of a cold or flu. Do not treat yourself. Avoid taking products that contain aspirin, acetaminophen, ibuprofen, naproxen, or ketoprofen unless instructed by your doctor. These medicines may hide a fever. Do not become pregnant while taking this medicine. Women should inform their doctor if they wish to become pregnant or think they might be pregnant. There is a potential for serious side effects to an unborn child. Talk to your health care professional or pharmacist for more information. Do not breast-feed an infant while taking this medicine. Men may have a lower  sperm count while taking this medicine. Talk to your doctor if you plan to father a child. What side effects may I notice from receiving this medicine? Side effects that you should report to your doctor or health care professional as soon as possible: -allergic reactions like skin rash, itching or hives, swelling of the face, lips, or tongue -breathing problems -confusion or changes in emotions or moods -constipation -cough -mouth sores -muscle weakness -nausea and vomiting -pain, swelling, redness or irritation at the injection site -pain, tingling, numbness in the hands or feet -problems with balance, talking, walking -seizures -stomach pain -trouble passing urine or change in the amount of urine Side effects that usually do not require medical attention (report to your doctor or health care professional if they continue or are bothersome): -diarrhea -hair loss -jaw pain -loss of appetite This list may not describe all possible side effects. Call your doctor for medical advice about side effects. You may report side effects to FDA at 1-800-FDA-1088. Where should I keep my medicine? This drug is given in a hospital or clinic and will not be stored at home. NOTE: This sheet is a summary. It may not cover all possible information. If you have questions about this medicine, talk to your doctor, pharmacist, or health care provider.  2018 Elsevier/Gold Standard (2008-03-08 17:17:13)    Neutropenia Neutropenia is a condition that occurs when you have a lower-than-normal level of a type of white blood cell (neutrophil) in your body. Neutrophils are made in the spongy center of large bones (bone marrow) and they fight infections. Neutrophils are your body's main defense against bacterial and fungal infections. The fewer neutrophils you have and the longer your body remains without them, the greater your risk of getting a severe infection. What are the causes? This condition can occur if  your body uses up or destroys neutrophils faster than your bone marrow can make them. This problem may happen because of:  Bacterial or fungal infection.  Allergic disorders.  Reactions to some medicines.  Autoimmune disease.  An enlarged spleen.  This condition can also occur if your bone marrow does not produce enough neutrophils. This problem may be caused by:  Cancer.  Cancer treatments, such as radiation or chemotherapy.  Viral infections.  Medicines, such as phenytoin.  Vitamin B12 deficiency.  Diseases of the bone marrow.  Environmental toxins, such as insecticides.  What are the signs or symptoms? This condition does not usually cause symptoms. If symptoms are present, they are usually caused by an underlying infection. Symptoms of an infection may include:  Fever.  Chills.  Swollen glands.  Oral or anal ulcers.  Cough and shortness of breath.  Rash.  Skin infection.  Fatigue.  How is this diagnosed?  Your health care provider may suspect neutropenia if you have:  A condition that may cause neutropenia.  Symptoms of infection, especially fever.  Frequent and unusual infections.  You will have a medical history and physical exam. Tests will also be done, such as:  A complete blood count (CBC).  A procedure to collect a sample of bone marrow for examination (bone marrow biopsy).  A chest X-ray.  A urine culture.  A blood culture.  How is this treated? Treatment depends on the underlying cause and severity of your condition. Mild neutropenia may not require treatment. Treatment may include medicines, such as:  Antibiotic medicine given through an IV tube.  Antiviral medicines.  Antifungal medicines.  A medicine to increase neutrophil production (colony-stimulating factor). You may get this drug through an IV tube or by injection.  Steroids given through an IV tube.  If an underlying condition is causing neutropenia, you may need  treatment for that condition. If medicines you are taking are causing neutropenia, your health care provider may have you stop taking those medicines. Follow these instructions at home: Medicines  Take over-the-counter and prescription medicines only as told by your health care provider.  Get a seasonal flu shot (influenza vaccine). Lifestyle  Do not eat unpasteurized foods.Do not eat unwashed raw fruits or vegetables.  Avoid exposure to groups of people or children.  Avoid being around people who are sick.  Avoid being around dirt or dust, such as in construction areas or gardens.  Do not provide direct care for pets. Avoid animal droppings. Do not clean litter boxes and bird cages. Hygiene   Bathe daily.  Clean the area between the genitals and the anus (perineal area) after you urinate or have a bowel movement. If you are female, wipe from front to back.  Brush your teeth with a soft toothbrush before and after meals.  Do not use a razor that has a blade. Use an electric razor to remove hair.  Wash your hands often. Make sure others who come in contact with you also wash their hands. If soap and water are not available, use hand sanitizer. General instructions  Do not have sex unless your health care provider has approved.  Take actions to avoid cuts and burns. For example: ? Be cautious when you use knives. Always cut away from yourself. ? Keep knives in protective sheaths or guards when not in use. ? Use oven mitts when you cook with a hot stove, oven, or grill. ? Stand a safe distance away from open fires.  Avoid people who received a vaccine in the past 30 days if that vaccine contained a live version of the germ (live vaccine). You should not get a live vaccine. Common live vaccines are varicella, measles, mumps, and rubella.  Do not share food utensils.  Do not use tampons, enemas, or rectal suppositories unless your health care provider has approved.  Keep all  appointments as told by your health care provider. This is important. Contact a health care provider if:  You have a fever.  You have chills or you start to shake.  You have: ? A sore throat. ? A warm, red, or tender area on your skin. ? A cough. ? Frequent or painful urination. ? Vaginal discharge or itching.  You develop: ? Sores in your mouth or anus. ? Swollen lymph nodes. ? Red streaks on the skin. ? A rash.  You feel: ? Nauseous or you vomit. ? Very fatigued. ? Short  of breath. This information is not intended to replace advice given to you by your health care provider. Make sure you discuss any questions you have with your health care provider. Document Released: 12/01/2001 Document Revised: 11/17/2015 Document Reviewed: 12/22/2014 Elsevier Interactive Patient Education  [employer].

## 2017-05-07 NOTE — Progress Notes (Signed)
Okay to treat with CBC results today of ANC 1.0 and WBC 2.0 per Dr. [name]

## 2017-05-07 NOTE — Progress Notes (Signed)
Kale's desk RN Lauren advised okay to treat with neutrophil 1.0 per MD.

## 2017-05-11 ENCOUNTER — Telehealth: Payer: Self-pay

## 2017-05-11 NOTE — Telephone Encounter (Signed)
Los requesting RTC visit and the provide has no available time slots on that day or the days before. I will contact Bonnie Larsen and make him aware of this. Per 11/13 los

## 2017-05-11 NOTE — Telephone Encounter (Signed)
Sent message to [facility] D. And Dr. [name] concerning the blocked schedule. On Saturday 11/17. Per 11/13 los

## 2017-05-13 ENCOUNTER — Telehealth: Payer: Self-pay

## 2017-05-13 NOTE — Telephone Encounter (Signed)
Called patient and left a voice message concerning upcoming appointment to see MD. [name] on 11/29 instead of the December 4th. Per 11/13 los.

## 2017-05-13 NOTE — Progress Notes (Signed)
HEMATOLOGY/ONCOLOGY CLINIC NOTE  Date of Service: .05/07/2017   Patient Care Team: Girtha Rm, NP-C as PCP - General (Family Medicine)  CHIEF COMPLAINTS/PURPOSE OF CONSULTATION:  Thoracic and abdominal lymphadenopathy concerning for lymphoproliferative disorder  HISTORY OF PRESENTING ILLNESS:  Bonnie Larsen is a wonderful 36 y.o. female who has been referred to [place] by Dr .Raenette Rover, Laurian Brim, NP-C for evaluation and management of thoracic paraspinal masses and upper abdominal extensive lymphadenopathy.  Patient has a history of uterine fibroids, right breast benign lesion biopsied in 2016, anxiety/panic attacks, migraine headaches and chronic sinus issues who presented to the emergency room brought by EMS for chest pain and shortness of breath about 7-10 days ago. She was noted to be hypertensive with a blood pressure of 180/110 which improved with nitroglycerin. She was also having some back pain and felt like her chest pain was radiating to the back. D-dimer level was borderline elevated. She had a CTA of the chest on 01/06/2017 that showed no evidence of pulmonary embolism. She was however noted to have Multiple prevertebral paraspinal masses, differential diagnosis includes extramedullary hematopoiesis and metastasis/lymphadenopathy. Recommend correlation for laboratory analysis for anemia and lymphoproliferative disease.  Patient was subsequently seen by her primary care physician and had a CT of the abdomen and pelvis with contrast on 01/09/2017 which showed Extensive upper abdominal adenopathy favoring lymphoma/lymphoproliferative disease, tissue diagnosis recommended. No splenomegaly or focal splenic lesion.  She was referred to [place] for further evaluation and management.  She was here with her mother and husband.  She notes no abdominal, or chest trauma. No fevers chills night sweats or unexpected weight loss. Mild constipation but no other change in bowel habits. No GI  bleeding noted. No change in food intake or swallowing. No urinary discomfort or abnormalities. Currently having no chest pain or shortness of breath. Mild upper abdominal discomfort. No other obvious peripheral lymphadenopathy noted.   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 She reports that she is doing well overall.   Notes improved appetite and decreased abdominal distension and back pain. No new prohibitive toxicities. She does report that her neulasta on pro cames off which playing her daughter and she did not get the dose. Mild neutropenia today as a result.   MEDICAL HISTORY:  Past Medical History:  Diagnosis Date  . Anxiety   . Difficult intravenous access   . Family history of adverse reaction to anesthesia    pt's mother has hx. of post-op N/V and hard to wake up post-op  . History of panic attacks    02/08/17- has not had a panic attacks  . Lymphoma ([place]) 01/2017  . Mesenteric lymphadenopathy 01/2017  . Runny nose 02/19/2017   clear drainage, per pt.  . Seasonal allergies     SURGICAL HISTORY: Past Surgical History:  Procedure Laterality Date  . HAND ASSISTED LAPAROSCOPIC LYMPH NODE BIOPSY N/A 02/11/2017   Performed by Stark Klein, MD at [facility]  . INSERTION PORT-A-CATH N/A 02/20/2017   Performed by Stark Klein, MD at [facility]  . TUBAL LIGATION    . UMBILICAL HERNIA REPAIR  02/11/2017    SOCIAL HISTORY: Social History   Socioeconomic History  . Marital status:  Married    Spouse name: Not on file  . Number of children: Not on file  . Years of education: Not on file  . Highest education level: Not on file  Social Needs  . Financial resource strain: Not on file  . Food insecurity - worry: Not on file  .  Food insecurity - inability: Not on file  . Transportation needs - medical: Not on file  . Transportation needs - non-medical: Not on file  Occupational History  . Not on file  Tobacco Use  . Smoking status: Never Smoker  . Smokeless tobacco: Never Used  Substance and Sexual Activity  . Alcohol use: No    Alcohol/week: 0.0 oz  . Drug use: No  . Sexual activity: Yes    Partners: Male    Birth control/protection: IUD    Comment: tubal ligation  Other Topics Concern  . Not on file  Social History Narrative  . Not on file    FAMILY HISTORY: Family History  Problem Relation Age of Onset  . Breast cancer Mother 3  . Fibromyalgia Mother   . Rheum arthritis Mother   . Neuropathy Mother   . Anesthesia problems Mother        post-op N/V; hard to wake up post-op  . Congestive Heart Failure Father        died at age 86  . Cirrhosis Father   . Heart disease Maternal Grandmother   . Diabetes Maternal Grandmother   . Other Brother        Benign spinal tumor  . Cancer Paternal Grandfather        Leukemia    ALLERGIES:  is allergic to clindamycin/lincomycin.  MEDICATIONS:  Current Outpatient Medications  Medication Sig Dispense Refill  . HYDROcodone-acetaminophen (NORCO) 7.5-325 MG tablet Take 1 tablet every 6 (six) hours as needed by mouth for moderate pain. 30 tablet 0  . ketotifen (ZADITOR) 0.025 % ophthalmic solution 1 drop 2 (two) times daily.    Marland Kitchen levonorgestrel (MIRENA) 20 MCG/24HR IUD 1 each by Intrauterine route once.    . lidocaine-prilocaine (EMLA) cream Apply 1 application topically as needed. 30 g 2  . LORazepam (ATIVAN) 0.5 MG tablet Take 1 tablet (0.5 mg total) by mouth every 8 (eight) hours as needed for anxiety. 30 tablet 0  . naproxen sodium (ANAPROX) 220 MG tablet Take 220 mg by mouth 2 (two) times daily with a meal.    . ondansetron (ZOFRAN) 8 MG tablet Take 1 tablet (8 mg total) by mouth 2 (two) times daily as needed for refractory nausea / vomiting. Start on  day 3 after chemo. 30 tablet 1  . predniSONE (DELTASONE) 20 MG tablet Take 3 tablets (60 mg total) by mouth daily. Take on days 1-5 of chemotherapy. 20 tablet 5  . prochlorperazine (COMPAZINE) 10 MG tablet Take 1 tablet (10 mg total) by mouth every 6 (six) hours as needed (Nausea or vomiting). 30 tablet 6  . senna (SENOKOT) 8.6 MG TABS tablet Take 1 tablet (8.6 mg total) by mouth 2 (two) times daily. 30 each 1   No current facility-administered medications for this visit.    Facility-Administered Medications Ordered in Other Visits  Medication Dose Route Frequency Provider Last Rate Last Dose  . sodium chloride flush (NS) 0.9 % injection 10 mL  10 mL Intracatheter PRN Brunetta Genera, MD   10 mL at 03/26/17 1724  . sodium chloride flush (NS) 0.9 % injection 10 mL  10 mL Intravenous PRN  Brunetta Genera, MD   10 mL at 04/16/17 6789    REVIEW OF SYSTEMS:    10 Point review of Systems was done is negative except as noted above.  PHYSICAL EXAMINATION:  ECOG PERFORMANCE STATUS: 1 - Symptomatic but completely ambulatory  . Vitals:   05/07/17 0841  BP: (!) 103/55  Pulse: 99  Resp: 20  Temp: 98.3 F (36.8 C)  SpO2: 100%   Filed Weights   05/07/17 0841  Weight: 227 lb 6.4 oz (103.1 kg)   .Body mass index is 40.28 kg/m.  GENERAL:alert, in no acute distress and comfortable SKIN: no acute rashes, no significant lesions EYES: conjunctiva are pink and non-injected, sclera anicteric OROPHARYNX: MMM, no exudates, no oropharyngeal erythema or ulceration NECK: supple, no JVD. Minimal neck swelling bilaterally, mild on the left sided without focal pain, tenderness or induration.  LYMPH:  no palpable lymphadenopathy in the cervical, axillary or inguinal regions LUNGS: clear to auscultation b/l with normal respiratory effort HEART: regular rate & rhythm ABDOMEN:  Healing surgical scar around the umbilicus. No TTP. No peritoneal signs. Some deep TTP mid/upper abd. Extremity: no pedal  edema PSYCH: alert & oriented x 3 with fluent speech NEURO: no focal motor/sensory deficits  LABORATORY DATA:  I have reviewed the data as listed  . CBC Latest Ref Rng & Units 05/07/2017 04/16/2017 04/02/2017  WBC 3.9 - 10.3 10e3/uL 2.0(L) 5.0 5.5  Hemoglobin 11.6 - 15.9 g/dL 12.6 13.8 13.8  Hematocrit 34.8 - 46.6 % 38.6 42.4 43.1  Platelets 145 - 400 10e3/uL 190 330 130(L)   ANC 1000  . CMP Latest Ref Rng & Units 05/07/2017 04/16/2017 04/02/2017  Glucose 70 - 140 mg/dl 96 101 119  BUN 7.0 - 26.0 mg/dL 15.5 9.8 11.5  Creatinine 0.6 - 1.1 mg/dL 0.9 0.9 1.0  Sodium 136 - 145 mEq/L 139 138 139  Potassium 3.5 - 5.1 mEq/L 4.0 4.4 4.1  Chloride 101 - 111 mmol/L - - -  CO2 22 - 29 mEq/L 24 25 27   Calcium 8.4 - 10.4 mg/dL 9.0 9.9 9.2  Total Protein 6.4 - 8.3 g/dL 6.4 7.0 6.5  Total Bilirubin 0.20 - 1.20 mg/dL 0.52 0.42 0.42  Alkaline Phos 40 - 150 U/L 36(L) 39(L) 65  AST 5 - 34 U/L 40(H) 16 11  ALT 0 - 55 U/L 30 13 11    Component     Latest Ref Rng & Units 01/14/2017  LDH     125 - 245 U/L 193  HIV Screen 4th Generation wRfx     Non Reactive Non Reactive  Hep C Virus Ab     0.0 - 0.9 s/co ratio <0.1  hCG,Beta Subunit,Qual,Serum     Negative <6 mIU/mL Negative  Sed Rate     0 - 32 mm/hr 7   Lymphnode biopsy, 02/11/2017 Diagnosis Lymph node for lymphoma, Mesenteric Lymphadenopathy - HODGKIN LYMPHOMA.         PROCEDURES  ECHO 03/13/17 Study Conclusions - Left ventricle: The cavity size was normal. Systolic function was   normal. The estimated ejection fraction was in the range of 55%   to 60%. Wall motion was normal; there were no regional wall   motion abnormalities. Left ventricular diastolic function   parameters were normal. - Atrial septum: No defect or patent foramen ovale was identified. - Impressions: Normal GLS -20.2.   RADIOGRAPHIC STUDIES: I have personally reviewed the radiological images as listed and agreed with the findings in the report. No  results found.  NM PET Image Initial (PI) Skull Base To Thigh (Accession [id number]) (Order [id number])  Imaging  Date: 02/01/2017 Department: [facility] Released By: Hilda Lias Authorizing: Brunetta Genera, MD  Exam Information   Status Exam Begun  Exam Ended   Final [99] 02/01/2017 1:34 PM 02/01/2017 3:02 PM  PACS Images   Show images for NM PET Image Initial (PI) Skull Base To Thigh  Study Result   CLINICAL DATA:  Initial treatment strategy for abdominal and thoracic lymphadenopathy on recent CT scans.  EXAM: NUCLEAR MEDICINE PET SKULL BASE TO THIGH  TECHNIQUE: 10.8 mCi F-18 FDG was injected intravenously. Full-ring PET imaging was performed from the skull base to thigh after the radiotracer. CT data was obtained and used for attenuation correction and anatomic localization.  FASTING BLOOD GLUCOSE:  Value: 91 mg/dl  COMPARISON:  CT scans dated 01/09/2017  FINDINGS: NECK  No hypermetabolic lymph nodes in the neck.  CHEST  Right paratracheal, subcarinal, paraesophageal, and para aortic/pleural soft tissue density nodules are identified in the chest, most of which demonstrate relatively low-grade in activity. A hypermetabolic periaortic lymph node on image 79/4 has a maximum SUV of 4.7 which is fairly representative of the other lymph nodes in the chest.  A smooth oval-shaped right breast lesion measuring 4.0 by 1.8 cm has a maximum SUV of 3.4.  The lungs appear clear.  ABDOMEN/PELVIS  Extensive bulky retroperitoneal, mesenteric, porta hepatis, retrocrural, and peripancreatic adenopathy observed. A mesenteric lymph node on image 130/4 measures 3.5 cm in short axis and has a maximum standard uptake value of 12.1. A lymph node above the pancreatic body measures 3.6 cm in short axis on image 102/4 and has a maximum SUV of 13.2. New  No splenomegaly. No focal splenic or hepatic lesion. The  extensive peripancreatic nodal disease makes it difficult to completely exclude a pancreatic lesion although I am skeptical of a pancreatic parenchymal lesion.  Non rotated right kidney. 5.4 by 4.7 cm right right for uterine adnexal cystic lesion appears photopenic. Fairly empty urinary bladder with flattened appearance causing several adjacent collections of urine containing FDG ; this simulates an anterior uterine lesion but there is no CT correlate on either today's exam or the 01/09/2017 exam to suggest that there is a true uterine lesion.  SKELETON  Low-grade diffuse mildly accentuated activity throughout the skeleton but without a focal skeletal lesion identified.  IMPRESSION: 1. Hypermetabolic retroperitoneal, mesenteric, peripancreatic, porta hepatis, and retrocrural adenopathy. 2. Nodularity in the left paraspinal, subcarinal, and paraesophageal regions in the chest as lower but still abnormal metabolic activity, and probably represents adenopathy rather than extramedullary hematopoiesis. There is also a faintly metabolic lesion in the right breast which probably represents the same lesion biopsied in 2016, with benign results. 3. Photopenic cystic right adnexal lesion. An IUD is present in the uterus.   Electronically Signed   By: Van Clines M.D.   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 -PET/CT shows fairly active disease. Abdominal disease if progression occurs could potentially cause organ injury. -LDH level and sedimentation rate within normal limits . -HIV negative  -Hepatitis C negative.  -Baseline ECHO from 03/13/17 shows normal heart  function, EF 55%-60%  2) Neutropenia - due to  non delivery of neulasta on pro (Device mechanically came off prior to delivery of medication) Plan -labs are stable and patient reports no prohibitive toxicities. -discussed presence of neutropenia and precautions to be taken so that the on pro device does not come off. -appropriate to proceed for cycle 3 of R-CHOP -continue Prednisone 60mg  po daily x 5 days. -Will prescribe ativan to use prn for anxiety    #2 Urinary urgency- UA neg. Symptoms resolved  #3  Patient Active Problem List   Diagnosis Date Noted  . 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 Mesenteric lymphadenopathy 02/11/2017  . Adenopathy 01/10/2017  . 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 Abnormal chest CT 01/07/2017  . History of panic attacks   . Bacterial pharyngitis 07/30/2016  -continue f/u with PCP for other chronic medical issues  #4 anxiety -Pt requests medication to help her anxiety through chemo PLAN:  -Previously advised patient to d/c ativan use and to take Trazodone PRN  #4 Left neck pain -- has port on left side. Now resolved. [place] neg for DVT. Likely muscle strain  Plan Now resolved  RTC with Dr [name] with labs on 11/21 for toxicity check PET/CT in 2weeks RTC with Dr [name] with labs on C4D1 and with PET/CT   All of the patients questions were answered with apparent satisfaction. The patient knows to call the clinic with any problems, questions or concerns.  I spent 20 minutes counseling the patient face to face. The total time spent in the appointment was 25 minutes and more than 50% was on counseling and direct patient cares.    Sullivan Lone MD [place] AAHIVMS [facility] Hematology/Oncology Physician [facility]  (Office):       [phone] (Work cell):  [phone] (Fax):           [phone]

## 2017-05-15 ENCOUNTER — Telehealth: Payer: Self-pay

## 2017-05-15 NOTE — Telephone Encounter (Signed)
Called patient to verify appointment date with provider the week before his vacation.per 11/21 sch message.

## 2017-05-19 NOTE — Progress Notes (Signed)
HEMATOLOGY/ONCOLOGY CLINIC NOTE  Date of Service: 05/19/17  Patient Care Team: Girtha Rm, NP-C as PCP - General (Family Medicine)  CHIEF COMPLAINTS/PURPOSE OF CONSULTATION:  Thoracic and abdominal lymphadenopathy concerning for lymphoproliferative disorder  HISTORY OF PRESENTING ILLNESS:  Bonnie Larsen is a wonderful 36 y.o. female who has been referred to [place] by Dr .Raenette Rover, Laurian Brim, NP-C for evaluation and management of thoracic paraspinal masses and upper abdominal extensive lymphadenopathy.  Patient has a history of uterine fibroids, right breast benign lesion biopsied in 2016, anxiety/panic attacks, migraine headaches and chronic sinus issues who presented to the emergency room brought by EMS for chest pain and shortness of breath about 7-10 days ago. She was noted to be hypertensive with a blood pressure of 180/110 which improved with nitroglycerin. She was also having some back pain and felt like her chest pain was radiating to the back. D-dimer level was borderline elevated. She had a CTA of the chest on 01/06/2017 that showed no evidence of pulmonary embolism. She was however noted to have Multiple prevertebral paraspinal masses, differential diagnosis includes extramedullary hematopoiesis and metastasis/lymphadenopathy. Recommend correlation for laboratory analysis for anemia and lymphoproliferative disease.  Patient was subsequently seen by her primary care physician and had a CT of the abdomen and pelvis with contrast on 01/09/2017 which showed Extensive upper abdominal adenopathy favoring lymphoma/lymphoproliferative disease, tissue diagnosis recommended. No splenomegaly or focal splenic lesion.  She was referred to [place] for further evaluation and management.  She was here with her mother and husband.  She notes no abdominal, or chest trauma. No fevers chills night sweats or unexpected weight loss. Mild constipation but no other change in bowel habits. No GI bleeding  noted. No change in food intake or swallowing. No urinary discomfort or abnormalities. Currently having no chest pain or shortness of breath. Mild upper abdominal discomfort. No other obvious peripheral lymphadenopathy noted.   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 She presents into the clinic alone today. No new prohibitive toxicities. She reports that this most recent cycle she has had a considerably decreased appetite with associated nausea with an increased gag reflex. She continued to use compazine and zofran throughout this cycle and she states that this was not controlling her nausea well. This combination had previously completely controlled her nausea. Additionally, she reports some cramping midline abdominal pain for three weeks with this cycle as well. Her pain was worse with palpation over the area and standing up. She denies any constipation or worsening of her pain post-prandially. No other focal areas of pain. Her pain has completely resolved at this time. She also reports a new onset of neuropathy which began in her left thumb and has progressed into her fingertips across the whole hand. This has been marginally improving somewhat. She also states that she has experienced intermittent rhinorrhea and nasal congestion over the past week. She did spike one fever (Tmax 100.5), but this was resolved with Tylenol.    On review of systems, pt denies chills, rash, mouth sores, weight loss, urinary complaints. Pt denies any other acute complaints. Pertinent positives as in the above HPI.     MEDICAL HISTORY:  Past Medical History:  Diagnosis Date  . Anxiety   . Difficult intravenous access   . Family  history of adverse reaction to anesthesia    pt's mother has hx. of post-op N/V and hard to wake up post-op  .  History of panic attacks    02/08/17- has not had a panic attacks  . Lymphoma ([place]) 01/2017  . Mesenteric lymphadenopathy 01/2017  . Runny nose 02/19/2017   clear drainage, per pt.  . Seasonal allergies     SURGICAL HISTORY: Past Surgical History:  Procedure Laterality Date  . LYMPH NODE BIOPSY N/A 02/11/2017   Procedure: HAND ASSISTED LAPAROSCOPIC LYMPH NODE BIOPSY;  Surgeon: Stark Klein, MD;  Location: [facility];  Service: General;  Laterality: N/A;  . PORTACATH PLACEMENT N/A 02/20/2017   Procedure: INSERTION PORT-A-CATH;  Surgeon: Stark Klein, MD;  Location: [facility];  Service: General;  Laterality: N/A;  . TUBAL LIGATION    . UMBILICAL HERNIA REPAIR  02/11/2017    SOCIAL HISTORY: Social History   Socioeconomic History  . Marital status: Married    Spouse name: Not on file  . Number of children: Not on file  . Years of education: Not on file  . Highest education level: Not on file  Social Needs  . Financial resource strain: Not on file  . Food insecurity - worry: Not on file  . Food insecurity - inability: Not on file  . Transportation needs - medical: Not on file  . Transportation needs - non-medical: Not on file  Occupational History  . Not on file  Tobacco Use  . Smoking status: Never Smoker  . Smokeless tobacco: Never Used  Substance and Sexual Activity  . Alcohol use: No    Alcohol/week: 0.0 oz  . Drug use: No  . Sexual activity: Yes    Partners: Male    Birth control/protection: IUD    Comment: tubal ligation  Other Topics Concern  . Not on file  Social History Narrative  . Not on file    FAMILY HISTORY: Family History  Problem Relation Age of Onset  . Breast cancer Mother 54  . Fibromyalgia Mother   . Rheum arthritis Mother   . Neuropathy Mother   . Anesthesia problems Mother        post-op N/V; hard to wake up post-op  . Congestive Heart Failure Father        died at age 50  . Cirrhosis Father   . Heart disease Maternal  Grandmother   . Diabetes Maternal Grandmother   . Other Brother        Benign spinal tumor  . Cancer Paternal Grandfather        Leukemia    ALLERGIES:  is allergic to clindamycin/lincomycin.  MEDICATIONS:  Current Outpatient Medications  Medication Sig Dispense Refill  . HYDROcodone-acetaminophen (NORCO) 7.5-325 MG tablet Take 1 tablet every 6 (six) hours as needed by mouth for moderate pain. 30 tablet 0  . ketotifen (ZADITOR) 0.025 % ophthalmic solution 1 drop 2 (two) times daily.    Marland Kitchen levonorgestrel (MIRENA) 20 MCG/24HR IUD 1 each by Intrauterine route once.    . lidocaine-prilocaine (EMLA) cream Apply 1 application topically as needed. 30 g 2  . LORazepam (ATIVAN) 0.5 MG tablet Take 1 tablet (0.5 mg total) by mouth every 8 (eight) hours as needed for anxiety. 30 tablet 0  . naproxen sodium (ANAPROX) 220 MG tablet Take 220 mg by mouth 2 (two) times daily with a meal.    . ondansetron (ZOFRAN) 8 MG tablet Take 1 tablet (8 mg total) by mouth 2 (two) times  daily as needed for refractory nausea / vomiting. Start on day 3 after chemo. 30 tablet 1  . predniSONE (DELTASONE) 20 MG tablet Take 3 tablets (60 mg total) by mouth daily. Take on days 1-5 of chemotherapy. 20 tablet 5  . prochlorperazine (COMPAZINE) 10 MG tablet Take 1 tablet (10 mg total) by mouth every 6 (six) hours as needed (Nausea or vomiting). 30 tablet 6  . senna (SENOKOT) 8.6 MG TABS tablet Take 1 tablet (8.6 mg total) by mouth 2 (two) times daily. 30 each 1   No current facility-administered medications for this visit.    Facility-Administered Medications Ordered in Other Visits  Medication Dose Route Frequency Provider Last Rate Last Dose  . sodium chloride flush (NS) 0.9 % injection 10 mL  10 mL Intracatheter PRN Brunetta Genera, MD   10 mL at 03/26/17 1724  . sodium chloride flush (NS) 0.9 % injection 10 mL  10 mL Intravenous PRN Brunetta Genera, MD   10 mL at 04/16/17 1938    REVIEW OF SYSTEMS:    10 Point  review of Systems was done is negative except as noted above.  PHYSICAL EXAMINATION:  ECOG PERFORMANCE STATUS: 1 - Symptomatic but completely ambulatory  . Vitals:   05/23/17 0935  BP: 125/78  Pulse: (!) 103  Resp: 20  Temp: 99.5 F (37.5 C)  SpO2: 97%   Filed Weights   05/23/17 0935  Weight: 223 lb 1.6 oz (101.2 kg)   .Body mass index is 39.52 kg/m.  GENERAL:alert, in no acute distress and comfortable SKIN: no acute rashes, no significant lesions EYES: conjunctiva are pink and non-injected, sclera anicteric OROPHARYNX: MMM, no exudates, no oropharyngeal erythema or ulceration NECK: supple, no JVD. Minimal neck swelling bilaterally, mild on the left sided without focal pain, tenderness or induration.  LYMPH:  no palpable lymphadenopathy in the cervical, axillary or inguinal regions LUNGS: clear to auscultation b/l with normal respiratory effort HEART: regular rate & rhythm ABDOMEN:  Healing surgical scar around the umbilicus. No TTP. No peritoneal signs. Some deep TTP mid/upper abd. Extremity: no pedal edema PSYCH: alert & oriented x 3 with fluent speech NEURO: no focal motor/sensory deficits  LABORATORY DATA:  I have reviewed the data as listed  . CBC Latest Ref Rng & Units 05/23/2017 05/07/2017 04/16/2017  WBC 3.9 - 10.3 10e3/uL 4.1 2.0(L) 5.0  Hemoglobin 11.6 - 15.9 g/dL 13.1 12.6 13.8  Hematocrit 34.8 - 46.6 % 40.5 38.6 42.4  Platelets 145 - 400 10e3/uL 144(L) 190 330   ANC 2500  . CMP Latest Ref Rng & Units 05/23/2017 05/07/2017 04/16/2017  Glucose 70 - 140 mg/dl 103 96 101  BUN 7.0 - 26.0 mg/dL 8.6 15.5 9.8  Creatinine 0.6 - 1.1 mg/dL 1.0 0.9 0.9  Sodium 136 - 145 mEq/L 141 139 138  Potassium 3.5 - 5.1 mEq/L 4.2 4.0 4.4  Chloride 101 - 111 mmol/L - - -  CO2 22 - 29 mEq/L 27 24 25   Calcium 8.4 - 10.4 mg/dL 9.6 9.0 9.9  Total Protein 6.4 - 8.3 g/dL 7.3 6.4 7.0  Total Bilirubin 0.20 - 1.20 mg/dL 0.42 0.52 0.42  Alkaline Phos 40 - 150 U/L 44 36(L) 39(L)    AST 5 - 34 U/L 23 40(H) 16  ALT 0 - 55 U/L 17 30 13    Component     Latest Ref Rng & Units 01/14/2017  LDH     125 - 245 U/L 193  HIV Screen 4th Generation wRfx  Non Reactive Non Reactive  Hep C Virus Ab     0.0 - 0.9 s/co ratio <0.1  hCG,Beta Subunit,Qual,Serum     Negative <6 mIU/mL Negative  Sed Rate     0 - 32 mm/hr 7   Lymphnode biopsy, 02/11/2017 Diagnosis Lymph node for lymphoma, Mesenteric Lymphadenopathy - HODGKIN LYMPHOMA.         PROCEDURES  ECHO 03/13/17 Study Conclusions - Left ventricle: The cavity size was normal. Systolic function was   normal. The estimated ejection fraction was in the range of 55%   to 60%. Wall motion was normal; there were no regional wall   motion abnormalities. Left ventricular diastolic function   parameters were normal. - Atrial septum: No defect or patent foramen ovale was identified. - Impressions: Normal GLS -20.2.   RADIOGRAPHIC STUDIES: I have personally reviewed the radiological images as listed and agreed with the findings in the report. No results found. NM PET Image Initial (PI) Skull Base To Thigh (Accession [id number]) (Order [id number])  Imaging  Date: 02/01/2017 Department: [facility]-NUCLEAR [facility] Released By: Hilda Lias Authorizing: Brunetta Genera, MD  Exam Information   Status Exam Begun  Exam Ended   Final [99] 02/01/2017 1:34 PM 02/01/2017 3:02 PM  PACS Images   Show images for NM PET Image Initial (PI) Skull Base To Thigh  Study Result   CLINICAL DATA:  Initial treatment strategy for abdominal and thoracic lymphadenopathy on recent CT scans.  EXAM: NUCLEAR MEDICINE PET SKULL BASE TO THIGH  TECHNIQUE: 10.8 mCi F-18 FDG was injected intravenously. Full-ring PET imaging was performed from the skull base to thigh after the radiotracer. CT data was obtained and used for attenuation correction and anatomic localization.  FASTING BLOOD GLUCOSE:  Value: 91  mg/dl  COMPARISON:  CT scans dated 01/09/2017  FINDINGS: NECK  No hypermetabolic lymph nodes in the neck.  CHEST  Right paratracheal, subcarinal, paraesophageal, and para aortic/pleural soft tissue density nodules are identified in the chest, most of which demonstrate relatively low-grade in activity. A hypermetabolic periaortic lymph node on image 79/4 has a maximum SUV of 4.7 which is fairly representative of the other lymph nodes in the chest.  A smooth oval-shaped right breast lesion measuring 4.0 by 1.8 cm has a maximum SUV of 3.4.  The lungs appear clear.  ABDOMEN/PELVIS  Extensive bulky retroperitoneal, mesenteric, porta hepatis, retrocrural, and peripancreatic adenopathy observed. A mesenteric lymph node on image 130/4 measures 3.5 cm in short axis and has a maximum standard uptake value of 12.1. A lymph node above the pancreatic body measures 3.6 cm in short axis on image 102/4 and has a maximum SUV of 13.2. New  No splenomegaly. No focal splenic or hepatic lesion. The extensive peripancreatic nodal disease makes it difficult to completely exclude a pancreatic lesion although I am skeptical of a pancreatic parenchymal lesion.  Non rotated right kidney. 5.4 by 4.7 cm right right for uterine adnexal cystic lesion appears photopenic. Fairly empty urinary bladder with flattened appearance causing several adjacent collections of urine containing FDG ; this simulates an anterior uterine lesion but there is no CT correlate on either today's exam or the 01/09/2017 exam to suggest that there is a true uterine lesion.  SKELETON  Low-grade diffuse mildly accentuated activity throughout the skeleton but without a focal skeletal lesion identified.  IMPRESSION: 1. Hypermetabolic retroperitoneal, mesenteric, peripancreatic, porta hepatis, and retrocrural adenopathy. 2. Nodularity in the left paraspinal, subcarinal, and paraesophageal regions in the chest  as  lower but still abnormal metabolic activity, and probably represents adenopathy rather than extramedullary hematopoiesis. There is also a faintly metabolic lesion in the right breast which probably represents the same lesion biopsied in 2016, with benign results. 3. Photopenic cystic right adnexal lesion. An IUD is present in the uterus.   Electronically Signed   By: Van Clines M.D.   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 -PET/CT shows fairly active disease. Abdominal disease if progression occurs could potentially cause organ injury. -LDH level and sedimentation rate within normal limits . -HIV negative  -Hepatitis C negative.  -Baseline ECHO from 03/13/17 shows normal heart function, EF 55%-60%  2) Neutropenia - due to non delivery of neulasta on pro (Device mechanically came off prior to delivery of medication) - now resolved.  3) grade 1 chemotherapy related neuropathy Plan -labs are stable and patient reports no significant prohibitive toxicities. -appropriate to proceed for cycle 4 of R-CHOP -I have also urged her to begin Pepcid with her steroids to assist with GERD-related nausea.   #4 Patient Active Problem List   Diagnosis Date Noted  . Nodular lymphocyte predominant Hodgkin lymphoma of lymph nodes of multiple regions ([place]) 03/19/2017  . Mesenteric lymphadenopathy 02/11/2017  . Adenopathy 01/10/2017  . 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 Abnormal chest CT 01/07/2017  . History of panic attacks   . Bacterial pharyngitis 07/30/2016  -continue f/u with PCP for other chronic medical issues  #4 anxiety -Pt requests medication to help her anxiety through  chemo PLAN:  -Previously advised patient to d/c ativan use and to take Trazodone PRN  #5 Left neck pain -- has port on left side. Now resolved. [place] neg for DVT. Likely muscle strain  Plan Now resolved  #5 Grade 1 neuropathy -Likely due to Vincristine with her infusion. If this continues to worsen we will hold off on this for her last two cycles.  -I encouraged her to begin taking Vitamin B complex with meals to assist with this.    F/u as per currently scheduled appointments for ongoing chemotherapy and clinic visit and labs   All of the patients questions were answered with apparent satisfaction. The patient knows to call the clinic with any problems, questions or concerns.  I spent 20 minutes counseling the patient face to face. The total time spent in the appointment was 25 minutes and more than 50% was on counseling and direct patient cares.    Sullivan Lone MD [place] AAHIVMS [facility] Hematology/Oncology Physician [facility]  (Office):       [phone] (Work cell):  [phone] (Fax):           [phone]  This document serves as a record of services personally performed by Sullivan Lone, MD. It was created on his behalf by Reola Mosher, a trained medical scribe. The creation of this record is based on the scribe's personal observations and the provider's statements to them.   .I have reviewed the above documentation for accuracy and completeness, and I agree with the above. Brunetta Genera MD MS

## 2017-05-23 ENCOUNTER — Ambulatory Visit (HOSPITAL_BASED_OUTPATIENT_CLINIC_OR_DEPARTMENT_OTHER): Payer: 59 | Admitting: Hematology

## 2017-05-23 ENCOUNTER — Encounter: Payer: Self-pay | Admitting: Hematology

## 2017-05-23 ENCOUNTER — Other Ambulatory Visit (HOSPITAL_BASED_OUTPATIENT_CLINIC_OR_DEPARTMENT_OTHER): Payer: 59

## 2017-05-23 VITALS — BP 125/78 | HR 103 | Temp 99.5°F | Resp 20 | Ht 63.0 in | Wt 223.1 lb

## 2017-05-23 DIAGNOSIS — R110 Nausea: Secondary | ICD-10-CM | POA: Diagnosis not present

## 2017-05-23 DIAGNOSIS — C8108 Nodular lymphocyte predominant Hodgkin lymphoma, lymph nodes of multiple sites: Secondary | ICD-10-CM | POA: Diagnosis not present

## 2017-05-23 DIAGNOSIS — G620 Drug-induced polyneuropathy: Secondary | ICD-10-CM

## 2017-05-23 DIAGNOSIS — K219 Gastro-esophageal reflux disease without esophagitis: Secondary | ICD-10-CM

## 2017-05-23 DIAGNOSIS — F419 Anxiety disorder, unspecified: Secondary | ICD-10-CM

## 2017-05-23 DIAGNOSIS — T451X5A Adverse effect of antineoplastic and immunosuppressive drugs, initial encounter: Secondary | ICD-10-CM

## 2017-05-23 DIAGNOSIS — D702 Other drug-induced agranulocytosis: Secondary | ICD-10-CM

## 2017-05-23 LAB — COMPREHENSIVE METABOLIC PANEL
ALT: 17 U/L (ref 0–55)
ANION GAP: 8 meq/L (ref 3–11)
AST: 23 U/L (ref 5–34)
Albumin: 4 g/dL (ref 3.5–5.0)
Alkaline Phosphatase: 44 U/L (ref 40–150)
BILIRUBIN TOTAL: 0.42 mg/dL (ref 0.20–1.20)
BUN: 8.6 mg/dL (ref 7.0–26.0)
CALCIUM: 9.6 mg/dL (ref 8.4–10.4)
CHLORIDE: 106 meq/L (ref 98–109)
CO2: 27 mEq/L (ref 22–29)
CREATININE: 1 mg/dL (ref 0.6–1.1)
Glucose: 103 mg/dl (ref 70–140)
Potassium: 4.2 mEq/L (ref 3.5–5.1)
Sodium: 141 mEq/L (ref 136–145)
Total Protein: 7.3 g/dL (ref 6.4–8.3)

## 2017-05-23 LAB — CBC & DIFF AND RETIC
BASO%: 0.2 % (ref 0.0–2.0)
BASOS ABS: 0 10*3/uL (ref 0.0–0.1)
EOS ABS: 0 10*3/uL (ref 0.0–0.5)
EOS%: 0.2 % (ref 0.0–7.0)
HEMATOCRIT: 40.5 % (ref 34.8–46.6)
HEMOGLOBIN: 13.1 g/dL (ref 11.6–15.9)
Immature Retic Fract: 3 % (ref 1.60–10.00)
LYMPH%: 19.1 % (ref 14.0–49.7)
MCH: 26 pg (ref 25.1–34.0)
MCHC: 32.3 g/dL (ref 31.5–36.0)
MCV: 80.5 fL (ref 79.5–101.0)
MONO#: 0.8 10*3/uL (ref 0.1–0.9)
MONO%: 19.3 % — AB (ref 0.0–14.0)
NEUT#: 2.5 10*3/uL (ref 1.5–6.5)
NEUT%: 61.2 % (ref 38.4–76.8)
Platelets: 144 10*3/uL — ABNORMAL LOW (ref 145–400)
RBC: 5.03 10*6/uL (ref 3.70–5.45)
RDW: 16.6 % — AB (ref 11.2–14.5)
RETIC %: 2.59 % — AB (ref 0.70–2.10)
Retic Ct Abs: 130.28 10*3/uL — ABNORMAL HIGH (ref 33.70–90.70)
WBC: 4.1 10*3/uL (ref 3.9–10.3)
lymph#: 0.8 10*3/uL — ABNORMAL LOW (ref 0.9–3.3)

## 2017-05-23 MED ORDER — HYDROCODONE-ACETAMINOPHEN 7.5-325 MG PO TABS: 1.0000 | ORAL_TABLET | Freq: Four times a day (QID) | ORAL | 0 refills | Status: DC | PRN

## 2017-05-23 NOTE — Patient Instructions (Signed)
Thank you for choosing [facility] to provide your oncology and hematology care.  To afford each patient quality time with our providers, please arrive 30 minutes before your scheduled appointment time.  If you arrive late for your appointment, you may be asked to reschedule.  We strive to give you quality time with our providers, and arriving late affects you and other patients whose appointments are after yours.   If you are a no show for multiple scheduled visits, you may be dismissed from the clinic at the providers discretion.    Again, thank you for choosing [facility], our hope is that these requests will decrease the amount of time that you wait before being seen by our physicians.  ______________________________________________________________________  Should you have questions after your visit to the [facility], please contact our office at [phone] between the hours of 8:30 and 4:30 p.m.    Voicemails left after 4:30p.m will not be returned until the following business day.    For prescription refill requests, please have your pharmacy contact us directly.  Please also try to allow 48 hours for prescription requests.    Please contact the scheduling department for questions regarding scheduling.  For scheduling of procedures such as PET scans, CT scans, MRI, Ultrasound, etc please contact central scheduling at [phone].    Resources For Cancer Patients and Caregivers:   Oncolink.org:  A wonderful resource for patients and healthcare providers for information regarding your disease, ways to tract your treatment, what to expect, etc.     American Cancer Society:  800-227-2345  Can help patients locate various types of support and financial assistance  Cancer Care: 1-800-813-HOPE (4673) Provides financial assistance, online support groups, medication/co-pay assistance.    [place] DSS:  336-641-3447 Where to apply for food  stamps, Medicaid, and utility assistance  Medicare Rights Center: 800-333-4114 Helps people with Medicare understand their rights and benefits, navigate the Medicare system, and secure the quality healthcare they deserve  SCAT: 336-333-6589 [place] [employer]'s shared-ride transportation service for eligible riders who have a disability that prevents them from riding the fixed route bus.    For additional information on assistance programs please contact our social worker:   Grier Hock/Abigail Elmore:  [phone]            

## 2017-05-24 ENCOUNTER — Telehealth: Payer: Self-pay | Admitting: Hematology

## 2017-05-24 NOTE — Telephone Encounter (Signed)
No additional appts to schedule per 11/29 los - F/u as per currently scheduled appointments for ongoing chemotherapy and clinic visit and labs

## 2017-05-28 ENCOUNTER — Other Ambulatory Visit: Payer: Self-pay | Admitting: Hematology and Oncology

## 2017-05-28 ENCOUNTER — Ambulatory Visit (HOSPITAL_BASED_OUTPATIENT_CLINIC_OR_DEPARTMENT_OTHER): Payer: 59

## 2017-05-28 ENCOUNTER — Ambulatory Visit: Payer: 59 | Admitting: Oncology

## 2017-05-28 ENCOUNTER — Other Ambulatory Visit: Payer: 59

## 2017-05-28 VITALS — BP 139/72 | HR 99 | Temp 98.0°F | Resp 17

## 2017-05-28 DIAGNOSIS — C8108 Nodular lymphocyte predominant Hodgkin lymphoma, lymph nodes of multiple sites: Secondary | ICD-10-CM

## 2017-05-28 DIAGNOSIS — Z5112 Encounter for antineoplastic immunotherapy: Secondary | ICD-10-CM

## 2017-05-28 DIAGNOSIS — Z452 Encounter for adjustment and management of vascular access device: Secondary | ICD-10-CM

## 2017-05-28 DIAGNOSIS — Z5111 Encounter for antineoplastic chemotherapy: Secondary | ICD-10-CM

## 2017-05-28 MED ORDER — PALONOSETRON HCL INJECTION 0.25 MG/5ML
0.2500 mg | Freq: Once | INTRAVENOUS | Status: AC
  Administered 2017-05-28: 0.25 mg via INTRAVENOUS

## 2017-05-28 MED ORDER — DIPHENHYDRAMINE HCL 25 MG PO CAPS
ORAL_CAPSULE | ORAL | Status: AC
  Filled 2017-05-28: qty 2

## 2017-05-28 MED ORDER — SODIUM CHLORIDE 0.9 % IV SOLN
750.0000 mg/m2 | Freq: Once | INTRAVENOUS | Status: AC
  Administered 2017-05-28: 1600 mg via INTRAVENOUS
  Filled 2017-05-28: qty 80

## 2017-05-28 MED ORDER — SODIUM CHLORIDE 0.9% FLUSH
10.0000 mL | INTRAVENOUS | Status: DC | PRN
  Administered 2017-05-28: 10 mL
  Filled 2017-05-28: qty 10

## 2017-05-28 MED ORDER — ACETAMINOPHEN 325 MG PO TABS
ORAL_TABLET | ORAL | Status: AC
  Filled 2017-05-28: qty 2

## 2017-05-28 MED ORDER — DIPHENHYDRAMINE HCL 25 MG PO CAPS
50.0000 mg | ORAL_CAPSULE | Freq: Once | ORAL | Status: AC
  Administered 2017-05-28: 50 mg via ORAL

## 2017-05-28 MED ORDER — FAMOTIDINE IN NACL 20-0.9 MG/50ML-% IV SOLN
20.0000 mg | Freq: Once | INTRAVENOUS | Status: AC
  Administered 2017-05-28: 20 mg via INTRAVENOUS

## 2017-05-28 MED ORDER — HEPARIN SOD (PORK) LOCK FLUSH 100 UNIT/ML IV SOLN
500.0000 [IU] | Freq: Once | INTRAVENOUS | Status: AC | PRN
  Administered 2017-05-28: 500 [IU]
  Filled 2017-05-28: qty 5

## 2017-05-28 MED ORDER — SODIUM CHLORIDE 0.9 % IV SOLN
Freq: Once | INTRAVENOUS | Status: AC
  Administered 2017-05-28: 12:00:00 via INTRAVENOUS

## 2017-05-28 MED ORDER — DEXAMETHASONE SODIUM PHOSPHATE 10 MG/ML IJ SOLN
INTRAMUSCULAR | Status: AC
  Filled 2017-05-28: qty 1

## 2017-05-28 MED ORDER — PROCHLORPERAZINE MALEATE 10 MG PO TABS
10.0000 mg | ORAL_TABLET | Freq: Once | ORAL | Status: AC
  Administered 2017-05-28: 10 mg via ORAL

## 2017-05-28 MED ORDER — ALTEPLASE 2 MG IJ SOLR
2.0000 mg | Freq: Once | INTRAMUSCULAR | Status: AC | PRN
  Administered 2017-05-28: 2 mg
  Filled 2017-05-28: qty 2

## 2017-05-28 MED ORDER — LORAZEPAM 1 MG PO TABS
0.5000 mg | ORAL_TABLET | Freq: Once | ORAL | Status: AC
  Administered 2017-05-28: 0.5 mg via ORAL

## 2017-05-28 MED ORDER — PALONOSETRON HCL INJECTION 0.25 MG/5ML
INTRAVENOUS | Status: AC
  Filled 2017-05-28: qty 5

## 2017-05-28 MED ORDER — SODIUM CHLORIDE 0.9 % IV SOLN
375.0000 mg/m2 | Freq: Once | INTRAVENOUS | Status: AC
  Administered 2017-05-28: 800 mg via INTRAVENOUS
  Filled 2017-05-28: qty 50

## 2017-05-28 MED ORDER — VINCRISTINE SULFATE CHEMO INJECTION 1 MG/ML
2.0000 mg | Freq: Once | INTRAVENOUS | Status: AC
  Administered 2017-05-28: 2 mg via INTRAVENOUS
  Filled 2017-05-28: qty 2

## 2017-05-28 MED ORDER — DEXAMETHASONE SODIUM PHOSPHATE 10 MG/ML IJ SOLN
10.0000 mg | Freq: Once | INTRAMUSCULAR | Status: AC
  Administered 2017-05-28: 10 mg via INTRAVENOUS

## 2017-05-28 MED ORDER — DOXORUBICIN HCL CHEMO IV INJECTION 2 MG/ML
50.0000 mg/m2 | Freq: Once | INTRAVENOUS | Status: AC
  Administered 2017-05-28: 106 mg via INTRAVENOUS
  Filled 2017-05-28: qty 53

## 2017-05-28 MED ORDER — FAMOTIDINE IN NACL 20-0.9 MG/50ML-% IV SOLN
INTRAVENOUS | Status: AC
  Filled 2017-05-28: qty 50

## 2017-05-28 MED ORDER — ACETAMINOPHEN 325 MG PO TABS
650.0000 mg | ORAL_TABLET | Freq: Once | ORAL | Status: AC
  Administered 2017-05-28: 650 mg via ORAL

## 2017-05-28 MED ORDER — PROCHLORPERAZINE MALEATE 10 MG PO TABS
ORAL_TABLET | ORAL | Status: AC
  Filled 2017-05-28: qty 1

## 2017-05-28 MED ORDER — LORAZEPAM 1 MG PO TABS
ORAL_TABLET | ORAL | Status: AC
  Filled 2017-05-28: qty 1

## 2017-05-28 MED ORDER — PEGFILGRASTIM 6 MG/0.6ML ~~LOC~~ PSKT
6.0000 mg | PREFILLED_SYRINGE | Freq: Once | SUBCUTANEOUS | Status: DC
  Filled 2017-05-28: qty 0.6

## 2017-05-28 NOTE — Progress Notes (Signed)
PAC accessed.  Flushes easily, but no bood return.  Patient put in multiple different positions, coughed, deep breathes with same result. 0955 cath flo initiated 0930 less than 0.83ml of blood return, cath flo continued 0955.  Less than 56ml of blood return, cath flo continued

## 2017-05-28 NOTE — Progress Notes (Signed)
@   1645 patient verbalized feeling of nausea. States this has been persistent since this am. Has not worsened. Spoke with Sandi Mealy, PA-C about patient's complaint. Orders received, repeated, and confirmed. Medicated per MAR.

## 2017-05-29 ENCOUNTER — Encounter (HOSPITAL_COMMUNITY)
Admission: RE | Admit: 2017-05-29 | Discharge: 2017-05-29 | Disposition: A | Discharge status: Home / Self Care | Payer: 59 | Source: Ambulatory Visit | Attending: Hematology | Admitting: Hematology

## 2017-05-29 ENCOUNTER — Telehealth: Payer: Self-pay

## 2017-05-29 ENCOUNTER — Ambulatory Visit (HOSPITAL_BASED_OUTPATIENT_CLINIC_OR_DEPARTMENT_OTHER): Payer: 59

## 2017-05-29 DIAGNOSIS — C8108 Nodular lymphocyte predominant Hodgkin lymphoma, lymph nodes of multiple sites: Secondary | ICD-10-CM | POA: Diagnosis not present

## 2017-05-29 DIAGNOSIS — Z5189 Encounter for other specified aftercare: Secondary | ICD-10-CM | POA: Diagnosis not present

## 2017-05-29 LAB — GLUCOSE, CAPILLARY: GLUCOSE-CAPILLARY: 151 mg/dL — AB (ref 65–99)

## 2017-05-29 MED ORDER — PEGFILGRASTIM 6 MG/0.6ML ~~LOC~~ PSKT
6.0000 mg | PREFILLED_SYRINGE | Freq: Once | SUBCUTANEOUS | Status: AC
  Administered 2017-05-29: 6 mg via SUBCUTANEOUS
  Filled 2017-05-29: qty 0.6

## 2017-05-29 MED ORDER — FLUDEOXYGLUCOSE F - 18 (FDG) INJECTION
11.1500 | Freq: Once | INTRAVENOUS | Status: AC | PRN
  Administered 2017-05-29: 11.15 via INTRAVENOUS

## 2017-05-29 NOTE — Progress Notes (Signed)
Patient unable to receive On Pro yesterday due to scheduled PET scan for this am. Patient returned today for On Pro placement. Patient had no complaints.

## 2017-05-29 NOTE — Addendum Note (Signed)
Addended by: Margaret Pyle on: 05/29/2017 09:42 AM   Modules accepted: Orders

## 2017-05-29 NOTE — Telephone Encounter (Signed)
Pt requested PET results prior to visit on 12/18. Per Dr. [name], "patient is showing a good response as expected. There will be no changes in therapy at this time." Pt verbalized thanks and knows to expect more in-depth discussion at her next visit with Dr. [name].

## 2017-06-11 ENCOUNTER — Ambulatory Visit (HOSPITAL_BASED_OUTPATIENT_CLINIC_OR_DEPARTMENT_OTHER): Payer: 59 | Admitting: Hematology

## 2017-06-11 VITALS — BP 109/67 | HR 99 | Temp 97.8°F | Resp 16 | Ht 63.0 in | Wt 224.0 lb

## 2017-06-11 DIAGNOSIS — T451X5A Adverse effect of antineoplastic and immunosuppressive drugs, initial encounter: Secondary | ICD-10-CM

## 2017-06-11 DIAGNOSIS — Z7189 Other specified counseling: Secondary | ICD-10-CM

## 2017-06-11 DIAGNOSIS — R51 Headache: Secondary | ICD-10-CM

## 2017-06-11 DIAGNOSIS — R110 Nausea: Secondary | ICD-10-CM | POA: Diagnosis not present

## 2017-06-11 DIAGNOSIS — G620 Drug-induced polyneuropathy: Secondary | ICD-10-CM | POA: Diagnosis not present

## 2017-06-11 DIAGNOSIS — F419 Anxiety disorder, unspecified: Secondary | ICD-10-CM

## 2017-06-11 DIAGNOSIS — C8108 Nodular lymphocyte predominant Hodgkin lymphoma, lymph nodes of multiple sites: Secondary | ICD-10-CM

## 2017-06-11 DIAGNOSIS — R42 Dizziness and giddiness: Secondary | ICD-10-CM

## 2017-06-11 DIAGNOSIS — R209 Unspecified disturbances of skin sensation: Secondary | ICD-10-CM | POA: Diagnosis not present

## 2017-06-11 MED ORDER — LORAZEPAM 0.5 MG PO TABS: 0.5000 mg | ORAL_TABLET | Freq: Three times a day (TID) | ORAL | 0 refills | Status: DC | PRN

## 2017-06-11 MED ORDER — HYDROCODONE-ACETAMINOPHEN 7.5-325 MG PO TABS: 1.0000 | ORAL_TABLET | Freq: Four times a day (QID) | ORAL | 0 refills | Status: DC | PRN

## 2017-06-11 MED ORDER — DULOXETINE HCL 30 MG PO CPEP: 30.0000 mg | ORAL_CAPSULE | Freq: Every day | ORAL | 3 refills | Status: DC

## 2017-06-11 NOTE — Progress Notes (Signed)
HEMATOLOGY/ONCOLOGY CLINIC NOTE  Date of Service: 06/11/17  Patient Care Team: Girtha Rm, NP-C as PCP - General (Family Medicine)  CHIEF COMPLAINTS/PURPOSE OF CONSULTATION:  Thoracic and abdominal lymphadenopathy concerning for lymphoproliferative disorder  HISTORY OF PRESENTING ILLNESS:  Bonnie Larsen is a wonderful 36 y.o. female who has been referred to [place] by Dr .Raenette Rover, Laurian Brim, NP-C for evaluation and management of thoracic paraspinal masses and upper abdominal extensive lymphadenopathy.  Patient has a history of uterine fibroids, right breast benign lesion biopsied in 2016, anxiety/panic attacks, migraine headaches and chronic sinus issues who presented to the emergency room brought by EMS for chest pain and shortness of breath about 7-10 days ago. She was noted to be hypertensive with a blood pressure of 180/110 which improved with nitroglycerin. She was also having some back pain and felt like her chest pain was radiating to the back. D-dimer level was borderline elevated. She had a CTA of the chest on 01/06/2017 that showed no evidence of pulmonary embolism. She was however noted to have Multiple prevertebral paraspinal masses, differential diagnosis includes extramedullary hematopoiesis and metastasis/lymphadenopathy. Recommend correlation for laboratory analysis for anemia and lymphoproliferative disease.  Patient was subsequently seen by her primary care physician and had a CT of the abdomen and pelvis with contrast on 01/09/2017 which showed Extensive upper abdominal adenopathy favoring lymphoma/lymphoproliferative disease, tissue diagnosis recommended. No splenomegaly or focal splenic lesion.  She was referred to [place] for further evaluation and management.  She was here with her mother and husband.  She notes no abdominal, or chest trauma. No fevers chills night sweats or unexpected weight loss. Mild constipation but no other change in bowel habits. No GI bleeding  noted. No change in food intake or swallowing. No urinary discomfort or abnormalities. Currently having no chest pain or shortness of breath. Mild upper abdominal discomfort. No other obvious peripheral lymphadenopathy noted.   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 She continues to use compazine and zofran and states that this is not controlling her nausea well. She is also experiencing gastritis at night which she is taking OTC medication to relieve. She states it only helps sometimes. She is taking her Ativan 2x daily. She reports numbness to her hands and feet with shooting pains to the feet. She has been keeping busy shopping for the holidays and exercising.   On review of systems, pt reports increased nausea, light headed, HA, constipation (1 time), minor chills, numbness to her hands and toes and denies fever and any other accompanying symptoms.  MEDICAL HISTORY:  Past Medical History:  Diagnosis Date  . Anxiety   . Difficult intravenous access   . Family history of adverse reaction to anesthesia    pt's mother has hx. of post-op N/V and hard to wake up post-op  . History of panic attacks    02/08/17- has not had a panic attacks  . Lymphoma ([place]) 01/2017  . Mesenteric lymphadenopathy 01/2017  . Runny nose 02/19/2017   clear drainage, per pt.  . Seasonal allergies     SURGICAL HISTORY: Past Surgical History:  Procedure Laterality Date  . LYMPH NODE BIOPSY N/A 02/11/2017   Procedure: HAND ASSISTED LAPAROSCOPIC LYMPH NODE BIOPSY;  Surgeon: Stark Klein, MD;  Location: [facility];  Service: General;  Laterality: N/A;  . PORTACATH PLACEMENT N/A 02/20/2017   Procedure: INSERTION PORT-A-CATH;  Surgeon: Stark Klein, MD;  Location: [facility];  Service: General;   Laterality: N/A;  . TUBAL LIGATION    . UMBILICAL HERNIA REPAIR  02/11/2017    SOCIAL HISTORY: Social History   Socioeconomic History  . Marital status: Married    Spouse name: Not on file  . Number of children: Not on file  . Years of education: Not on file  . Highest education level: Not on file  Social Needs  . Financial resource strain: Not on file  . Food insecurity - worry: Not on file  . Food insecurity - inability: Not on file  . Transportation needs - medical: Not on file  . Transportation needs - non-medical: Not on file  Occupational History  . Not on file  Tobacco Use  . Smoking status: Never Smoker  . Smokeless tobacco: Never Used  Substance and Sexual Activity  . Alcohol use: No    Alcohol/week: 0.0 oz  . Drug use: No  . Sexual activity: Yes    Partners: Male    Birth control/protection: IUD    Comment: tubal ligation  Other Topics Concern  . Not on file  Social History Narrative  . Not on file    FAMILY HISTORY: Family History  Problem Relation Age of Onset  . Breast cancer Mother 60  . Fibromyalgia Mother   . Rheum arthritis Mother   . Neuropathy Mother   . Anesthesia problems Mother        post-op N/V; hard to wake up post-op  . Congestive Heart Failure Father        died at age 54  . Cirrhosis Father   . Heart disease Maternal Grandmother   . Diabetes Maternal Grandmother   . Other Brother        Benign spinal tumor  . Cancer Paternal Grandfather        Leukemia    ALLERGIES:  is allergic to clindamycin/lincomycin.  MEDICATIONS:  Current Outpatient Medications  Medication Sig Dispense Refill  . HYDROcodone-acetaminophen (NORCO) 7.5-325 MG tablet Take 1 tablet by mouth every 6 (six) hours as needed for moderate pain. 30 tablet 0  . ketotifen (ZADITOR) 0.025 % ophthalmic solution 1 drop 2 (two) times daily.    Marland Kitchen levonorgestrel (MIRENA) 20 MCG/24HR IUD 1 each by Intrauterine route once.    . lidocaine-prilocaine (EMLA) cream Apply 1  application topically as needed. 30 g 2  . LORazepam (ATIVAN) 0.5 MG tablet Take 1 tablet (0.5 mg total) by mouth every 8 (eight) hours as needed for anxiety. 30 tablet 0  . naproxen sodium (ANAPROX) 220 MG tablet Take 220 mg by mouth 2 (two) times daily with a meal.    . ondansetron (ZOFRAN) 8 MG tablet Take 1 tablet (8 mg total) by mouth 2 (two) times daily as needed for refractory nausea / vomiting. Start on day 3 after chemo. 30 tablet 1  . predniSONE (DELTASONE) 20 MG tablet Take 3 tablets (60 mg total) by mouth daily. Take on days 1-5 of chemotherapy. 20 tablet 5  . prochlorperazine (COMPAZINE) 10 MG tablet Take 1 tablet (10 mg total) by mouth every 6 (six) hours as needed (Nausea or vomiting). 30 tablet 6  . senna (SENOKOT) 8.6 MG TABS tablet Take 1 tablet (8.6 mg total) by mouth 2 (two) times daily. 30 each 1   No current facility-administered medications  for this visit.    Facility-Administered Medications Ordered in Other Visits  Medication Dose Route Frequency Provider Last Rate Last Dose  . sodium chloride flush (NS) 0.9 % injection 10 mL  10 mL Intracatheter PRN Brunetta Genera, MD   10 mL at 03/26/17 1724  . sodium chloride flush (NS) 0.9 % injection 10 mL  10 mL Intravenous PRN Brunetta Genera, MD   10 mL at 04/16/17 1938    REVIEW OF SYSTEMS:    10 Point review of Systems was done is negative except as noted above.  PHYSICAL EXAMINATION:  ECOG PERFORMANCE STATUS: 1 - Symptomatic but completely ambulatory  . Vitals:   06/11/17 0904  BP: 109/67  Pulse: 99  Resp: 16  Temp: 97.8 F (36.6 C)  SpO2: 100%   Filed Weights   06/11/17 0904  Weight: 224 lb (101.6 kg)   .Body mass index is 39.68 kg/m.  GENERAL:alert, in no acute distress and comfortable SKIN: no acute rashes, no significant lesions EYES: conjunctiva are pink and non-injected, sclera anicteric OROPHARYNX: MMM, no exudates, no oropharyngeal erythema or ulceration NECK: supple, no JVD. Minimal  neck swelling bilaterally, mild on the left sided without focal pain, tenderness or induration.  LYMPH:  no palpable lymphadenopathy in the cervical, axillary or inguinal regions LUNGS: clear to auscultation b/l with normal respiratory effort HEART: regular rate & rhythm ABDOMEN:  Healing surgical scar around the umbilicus. No TTP. No peritoneal signs. Some deep TTP mid/upper abd. Extremity: no pedal edema PSYCH: alert & oriented x 3 with fluent speech NEURO: no focal motor/sensory deficits  LABORATORY DATA:  I have reviewed the data as listed  . CBC Latest Ref Rng & Units 06/19/2017 05/23/2017 05/07/2017  WBC 3.9 - 10.3 10e3/uL 3.6(L) 4.1 2.0(L)  Hemoglobin 11.6 - 15.9 g/dL 12.3 13.1 12.6  Hematocrit 34.8 - 46.6 % 37.6 40.5 38.6  Platelets 145 - 400 10e3/uL 257 144(L) 190   ANC 2500  . CMP Latest Ref Rng & Units 06/19/2017 05/23/2017 05/07/2017  Glucose 70 - 140 mg/dl 90 103 96  BUN 7.0 - 26.0 mg/dL 10.7 8.6 15.5  Creatinine 0.6 - 1.1 mg/dL 0.9 1.0 0.9  Sodium 136 - 145 mEq/L 141 141 139  Potassium 3.5 - 5.1 mEq/L 3.9 4.2 4.0  Chloride 101 - 111 mmol/L - - -  CO2 22 - 29 mEq/L 25 27 24   Calcium 8.4 - 10.4 mg/dL 9.5 9.6 9.0  Total Protein 6.4 - 8.3 g/dL 6.9 7.3 6.4  Total Bilirubin 0.20 - 1.20 mg/dL 0.32 0.42 0.52  Alkaline Phos 40 - 150 U/L 35(L) 44 36(L)  AST 5 - 34 U/L 17 23 40(H)  ALT 0 - 55 U/L 15 17 30    Component     Latest Ref Rng & Units 01/14/2017  LDH     125 - 245 U/L 193  HIV Screen 4th Generation wRfx     Non Reactive Non Reactive  Hep C Virus Ab     0.0 - 0.9 s/co ratio <0.1  hCG,Beta Subunit,Qual,Serum     Negative <6 mIU/mL Negative  Sed Rate     0 - 32 mm/hr 7   Lymphnode biopsy, 02/11/2017 Diagnosis Lymph node for lymphoma, Mesenteric Lymphadenopathy - HODGKIN LYMPHOMA.         PROCEDURES  ECHO 03/13/17 Study Conclusions - Left ventricle: The cavity size was normal. Systolic function was   normal. The estimated ejection fraction was  in the range of 55%   to 60%.  Wall motion was normal; there were no regional wall   motion abnormalities. Left ventricular diastolic function   parameters were normal. - Atrial septum: No defect or patent foramen ovale was identified. - Impressions: Normal GLS -20.2.   RADIOGRAPHIC STUDIES: I have personally reviewed the radiological images as listed and agreed with the findings in the report. Nm Pet Image Restag (ps) Skull Base To Thigh  Result Date: 05/29/2017 CLINICAL DATA:  Subsequent treatment strategy for Hodgkin's lymphoma. EXAM: NUCLEAR MEDICINE PET SKULL BASE TO THIGH TECHNIQUE: 11.2 mCi F-18 FDG was injected intravenously. Full-ring PET imaging was performed from the skull base to thigh after the radiotracer. CT data was obtained and used for attenuation correction and anatomic localization. FASTING BLOOD GLUCOSE:  Value: 151 mg/dl COMPARISON:  Multiple exams, including 02/01/2017 FINDINGS: Considerable muscular uptake is likely exercise related. NECK No hypermetabolic lymph nodes in the neck. CHEST Significant improvement in the adenopathy. For example, the subcarinal node anterior to the esophagus measures 0.6 cm on image 62/4 with maximum SUV of approximately 1.7, previously measuring 1.0 cm with maximum SUV 5.0. A prior left paraspinal/left periaortic lymph node or focus of tumor measures 0.8 cm in short axis on image 71/4, previously 1.7 cm, and has a maximum standard uptake value of 1.4 (formerly 4.7). Background mediastinal blood pool activity: 1.3. Left Port-A-Cath tip: Cavoatrial junction. Mild cardiomegaly. Stable right breast lesion, previously biopsied and shown benign. ABDOMEN/PELVIS Prominent improvement of the prior extensive bulky retroperitoneal, mesenteric, peripancreatic, retrocrural, and porta hepatis adenopathy the lymph node above the pancreatic body measures 1.2 cm in short axis on image 100/4 (formerly 3.6 cm) and has a maximum standard uptake value of 1.2 (formerly  13.2). The representative central mesenteric lymph node measures 1.3 cm in short axis on image 123/4 (formerly 3.5 cm) with maximum SUV 1.8 (formerly 12.6). Background hepatic uptake SUV:  2.7. Similar appearance of non rotated right kidney. Prior right adnexal cyst has essentially resolved. IUD observed. No splenomegaly. On the prior exam I wondered whether some anterior activity along the uterus was due to intrinsic uterine lesion or urinary bladder and included that this was likely in the urinary bladder. However, on today's exam the uterus is newly retroverted, bringing its fundus well away from the urinary bladder, and there is still an apparent intramural lesion slightly eccentric to the right along the uterine fundus with hypermetabolic activity, maximum SUV 8.1 today (previously potentially up to 22.7. SKELETON No focal hypermetabolic activity to suggest skeletal metastasis. IMPRESSION: 1. Significant reduction in size and metabolic activity of the formerly extensive adenopathy in the chest and abdomen, with primarily Deauville 3 activity today, and some Deauville 2 activity. Previously this was Deauville 5. 2. Anterior uterine lesion is hypermetabolic. Uterine fibroids can sometimes be hypermetabolic ; pelvic sonography is recommended for further characterization. Electronically Signed   By: Van Clines M.D.   On: 05/29/2017 14:55   NM PET Image Initial (PI) Skull Base To Thigh (Accession [id number]) (Order [id number])  Imaging  Date: 02/01/2017 Department: [facility] Released By: Hilda Lias Authorizing: Brunetta Genera, MD  Exam Information   Status Exam Begun  Exam Ended   Final [99] 02/01/2017 1:34 PM 02/01/2017 3:02 PM  PACS Images   Show images for NM PET Image Initial (PI) Skull Base To Thigh  Study Result   CLINICAL DATA:  Initial treatment strategy for abdominal and thoracic lymphadenopathy on recent CT scans.  EXAM: NUCLEAR  MEDICINE PET SKULL BASE TO THIGH  TECHNIQUE: 10.8 mCi F-18 FDG was injected intravenously. Full-ring PET imaging was performed from the skull base to thigh after the radiotracer. CT data was obtained and used for attenuation correction and anatomic localization.  FASTING BLOOD GLUCOSE:  Value: 91 mg/dl  COMPARISON:  CT scans dated 01/09/2017  FINDINGS: NECK  No hypermetabolic lymph nodes in the neck.  CHEST  Right paratracheal, subcarinal, paraesophageal, and para aortic/pleural soft tissue density nodules are identified in the chest, most of which demonstrate relatively low-grade in activity. A hypermetabolic periaortic lymph node on image 79/4 has a maximum SUV of 4.7 which is fairly representative of the other lymph nodes in the chest.  A smooth oval-shaped right breast lesion measuring 4.0 by 1.8 cm has a maximum SUV of 3.4.  The lungs appear clear.  ABDOMEN/PELVIS  Extensive bulky retroperitoneal, mesenteric, porta hepatis, retrocrural, and peripancreatic adenopathy observed. A mesenteric lymph node on image 130/4 measures 3.5 cm in short axis and has a maximum standard uptake value of 12.1. A lymph node above the pancreatic body measures 3.6 cm in short axis on image 102/4 and has a maximum SUV of 13.2. New  No splenomegaly. No focal splenic or hepatic lesion. The extensive peripancreatic nodal disease makes it difficult to completely exclude a pancreatic lesion although I am skeptical of a pancreatic parenchymal lesion.  Non rotated right kidney. 5.4 by 4.7 cm right right for uterine adnexal cystic lesion appears photopenic. Fairly empty urinary bladder with flattened appearance causing several adjacent collections of urine containing FDG ; this simulates an anterior uterine lesion but there is no CT correlate on either today's exam or the 01/09/2017 exam to suggest that there is a true uterine lesion.  SKELETON  Low-grade diffuse mildly  accentuated activity throughout the skeleton but without a focal skeletal lesion identified.  IMPRESSION: 1. Hypermetabolic retroperitoneal, mesenteric, peripancreatic, porta hepatis, and retrocrural adenopathy. 2. Nodularity in the left paraspinal, subcarinal, and paraesophageal regions in the chest as lower but still abnormal metabolic activity, and probably represents adenopathy rather than extramedullary hematopoiesis. There is also a faintly metabolic lesion in the right breast which probably represents the same lesion biopsied in 2016, with benign results. 3. Photopenic cystic right adnexal lesion. An IUD is present in the uterus.   Electronically Signed   By: Van Clines M.D.   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 -PET/CT shows fairly active disease. Abdominal disease if progression occurs could potentially cause organ injury. -LDH level and sedimentation rate within normal limits . -HIV negative  -Hepatitis C negative.  -Baseline ECHO from 03/13/17 shows normal heart function, EF 55%-60%  2) Neutropenia - due to non delivery of neulasta on pro (Device mechanically came off prior to delivery of medication) - now resolved.  3) grade 1 chemotherapy related neuropathy Plan -labs are stable and patient reports no significant prohibitive toxicities.  -appropriate to proceed for cycle 5 of R-CHOP -famotidine or omeprazole OTC for GERD. -emend added to pre-medications to optimize control of chemorx related nausea. -  #4 Patient Active Problem List   Diagnosis Date Noted  . 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 Mesenteric lymphadenopathy 02/11/2017  . Adenopathy 01/10/2017  . Paraspinal mass  01/07/2017  . Abnormal chest CT 01/07/2017  . History of panic attacks   . Bacterial pharyngitis 07/30/2016  -continue f/u with PCP for other chronic medical issues  #4 anxiety -Pt requests medication to help her anxiety through chemo PLAN:  -Previously advised patient to d/c ativan use and to take Trazodone PRN  #5 Left neck pain -- has port on left side. Now resolved. [place] neg for DVT. Likely muscle strain  Plan Now resolved  #5 Grade 1 neuropathy -Likely due to Vincristine with her infusion. If this continues to worsen we will hold off on this for her last two cycles.  -I encouraged her to begin taking Vitamin B complex with meals to assist with this.   -Will start on Cymbalta 30mg  po daily -Reduce dose of Vincristine  PLAN -Omeprazole to aid gastritis -Will decrease dose of Vincristine -Will start Cymbalta to help neuropathy  -Refill on Ativan and Hydrocodone today -Advised pt to use stool softener for aid of constipation  -F/u as per currently scheduled appointments for ongoing chemotherapy and clinic visit and labs -F/u for C5 R-CHOP 06/19/2017 with neulasta support -Please schedule C6 or R-CHOP as ordered   RTC with Dr [name] in clinic with labs with C6 of R-CHOP   All of the patients questions were answered with apparent satisfaction. The patient knows to call the clinic with any problems, questions or concerns.  I spent 20 minutes counseling the patient face to face. The total time spent in the appointment was 25 minutes and more than 50% was on counseling and direct patient cares.    Sullivan Lone MD [place] AAHIVMS [facility] Hematology/Oncology Physician [facility]  (Office):       [phone] (Work cell):  [phone] (Fax):           [phone]  This document serves as a record of services personally performed by Sullivan Lone, MD. It was created on his  behalf by Alean Rinne, a trained medical scribe. The creation of this record is based on the scribe's personal observations and the provider's statements to them.   .I have reviewed the above documentation for accuracy and completeness, and I agree with the above. Brunetta Genera MD MS

## 2017-06-13 ENCOUNTER — Telehealth: Payer: Self-pay | Admitting: Hematology

## 2017-06-13 NOTE — Telephone Encounter (Signed)
Spoke to patient regarding upcoming December and January appointments.

## 2017-06-17 ENCOUNTER — Other Ambulatory Visit: Payer: Self-pay | Admitting: *Deleted

## 2017-06-17 DIAGNOSIS — C8108 Nodular lymphocyte predominant Hodgkin lymphoma, lymph nodes of multiple sites: Secondary | ICD-10-CM

## 2017-06-19 ENCOUNTER — Other Ambulatory Visit (HOSPITAL_BASED_OUTPATIENT_CLINIC_OR_DEPARTMENT_OTHER): Payer: 59

## 2017-06-19 ENCOUNTER — Other Ambulatory Visit: Payer: 59

## 2017-06-19 ENCOUNTER — Other Ambulatory Visit: Payer: Self-pay

## 2017-06-19 ENCOUNTER — Ambulatory Visit (HOSPITAL_BASED_OUTPATIENT_CLINIC_OR_DEPARTMENT_OTHER): Payer: 59

## 2017-06-19 ENCOUNTER — Ambulatory Visit: Payer: 59

## 2017-06-19 VITALS — BP 133/74 | HR 99 | Temp 98.9°F | Resp 17

## 2017-06-19 DIAGNOSIS — Z5112 Encounter for antineoplastic immunotherapy: Secondary | ICD-10-CM

## 2017-06-19 DIAGNOSIS — C8108 Nodular lymphocyte predominant Hodgkin lymphoma, lymph nodes of multiple sites: Secondary | ICD-10-CM | POA: Diagnosis not present

## 2017-06-19 DIAGNOSIS — Z5111 Encounter for antineoplastic chemotherapy: Secondary | ICD-10-CM

## 2017-06-19 LAB — COMPREHENSIVE METABOLIC PANEL
ALBUMIN: 3.8 g/dL (ref 3.5–5.0)
ALK PHOS: 35 U/L — AB (ref 40–150)
ALT: 15 U/L (ref 0–55)
ANION GAP: 9 meq/L (ref 3–11)
AST: 17 U/L (ref 5–34)
BUN: 10.7 mg/dL (ref 7.0–26.0)
CALCIUM: 9.5 mg/dL (ref 8.4–10.4)
CO2: 25 mEq/L (ref 22–29)
CREATININE: 0.9 mg/dL (ref 0.6–1.1)
Chloride: 107 mEq/L (ref 98–109)
EGFR: >60 mL/min/{1.73_m2} (ref >60)
Glucose: 90 mg/dl (ref 70–140)
POTASSIUM: 3.9 meq/L (ref 3.5–5.1)
Sodium: 141 mEq/L (ref 136–145)
Total Bilirubin: 0.32 mg/dL (ref 0.20–1.20)
Total Protein: 6.9 g/dL (ref 6.4–8.3)

## 2017-06-19 LAB — CBC WITH DIFFERENTIAL/PLATELET
BASO%: 0.3 % (ref 0.0–2.0)
Basophils Absolute: 0 10*3/uL (ref 0.0–0.1)
EOS ABS: 0 10*3/uL (ref 0.0–0.5)
EOS%: 1.1 % (ref 0.0–7.0)
HEMATOCRIT: 37.6 % (ref 34.8–46.6)
HGB: 12.3 g/dL (ref 11.6–15.9)
LYMPH#: 0.8 10*3/uL — AB (ref 0.9–3.3)
LYMPH%: 21.1 % (ref 14.0–49.7)
MCH: 27.3 pg (ref 25.1–34.0)
MCHC: 32.7 g/dL (ref 31.5–36.0)
MCV: 83.6 fL (ref 79.5–101.0)
MONO#: 0.7 10*3/uL (ref 0.1–0.9)
MONO%: 18.6 % — ABNORMAL HIGH (ref 0.0–14.0)
NEUT%: 58.9 % (ref 38.4–76.8)
NEUTROS ABS: 2.1 10*3/uL (ref 1.5–6.5)
PLATELETS: 257 10*3/uL (ref 145–400)
RBC: 4.5 10*6/uL (ref 3.70–5.45)
RDW: 16.5 % — ABNORMAL HIGH (ref 11.2–14.5)
WBC: 3.6 10*3/uL — AB (ref 3.9–10.3)

## 2017-06-19 MED ORDER — HEPARIN SOD (PORK) LOCK FLUSH 100 UNIT/ML IV SOLN
500.0000 [IU] | Freq: Once | INTRAVENOUS | Status: AC | PRN
  Administered 2017-06-19: 500 [IU]
  Filled 2017-06-19: qty 5

## 2017-06-19 MED ORDER — LORAZEPAM 1 MG PO TABS
0.5000 mg | ORAL_TABLET | Freq: Once | ORAL | Status: AC
  Administered 2017-06-19: 0.5 mg via ORAL

## 2017-06-19 MED ORDER — ACETAMINOPHEN 325 MG PO TABS
ORAL_TABLET | ORAL | Status: AC
  Filled 2017-06-19: qty 2

## 2017-06-19 MED ORDER — PEGFILGRASTIM 6 MG/0.6ML ~~LOC~~ PSKT
PREFILLED_SYRINGE | SUBCUTANEOUS | Status: AC
  Filled 2017-06-19: qty 0.6

## 2017-06-19 MED ORDER — DIPHENHYDRAMINE HCL 25 MG PO CAPS
ORAL_CAPSULE | ORAL | Status: AC
  Filled 2017-06-19: qty 2

## 2017-06-19 MED ORDER — PALONOSETRON HCL INJECTION 0.25 MG/5ML
0.2500 mg | Freq: Once | INTRAVENOUS | Status: AC
  Administered 2017-06-19: 0.25 mg via INTRAVENOUS

## 2017-06-19 MED ORDER — DOXORUBICIN HCL CHEMO IV INJECTION 2 MG/ML
50.0000 mg/m2 | Freq: Once | INTRAVENOUS | Status: AC
  Administered 2017-06-19: 106 mg via INTRAVENOUS
  Filled 2017-06-19: qty 53

## 2017-06-19 MED ORDER — FAMOTIDINE IN NACL 20-0.9 MG/50ML-% IV SOLN
INTRAVENOUS | Status: AC
  Filled 2017-06-19: qty 50

## 2017-06-19 MED ORDER — PEGFILGRASTIM 6 MG/0.6ML ~~LOC~~ PSKT
6.0000 mg | PREFILLED_SYRINGE | Freq: Once | SUBCUTANEOUS | Status: AC
  Administered 2017-06-19: 6 mg via SUBCUTANEOUS

## 2017-06-19 MED ORDER — SODIUM CHLORIDE 0.9 % IV SOLN
Freq: Once | INTRAVENOUS | Status: AC
  Administered 2017-06-19: 11:00:00 via INTRAVENOUS
  Filled 2017-06-19: qty 5

## 2017-06-19 MED ORDER — SODIUM CHLORIDE 0.9% FLUSH
10.0000 mL | INTRAVENOUS | Status: DC | PRN
  Administered 2017-06-19: 10 mL
  Filled 2017-06-19: qty 10

## 2017-06-19 MED ORDER — FAMOTIDINE IN NACL 20-0.9 MG/50ML-% IV SOLN
20.0000 mg | Freq: Once | INTRAVENOUS | Status: AC
  Administered 2017-06-19: 20 mg via INTRAVENOUS

## 2017-06-19 MED ORDER — LORAZEPAM 1 MG PO TABS
ORAL_TABLET | ORAL | Status: AC
  Filled 2017-06-19: qty 1

## 2017-06-19 MED ORDER — ACETAMINOPHEN 325 MG PO TABS
650.0000 mg | ORAL_TABLET | Freq: Once | ORAL | Status: AC
  Administered 2017-06-19: 650 mg via ORAL

## 2017-06-19 MED ORDER — VINCRISTINE SULFATE CHEMO INJECTION 1 MG/ML
1.0000 mg | Freq: Once | INTRAVENOUS | Status: AC
  Administered 2017-06-19: 1 mg via INTRAVENOUS
  Filled 2017-06-19: qty 1

## 2017-06-19 MED ORDER — SODIUM CHLORIDE 0.9% FLUSH
10.0000 mL | Freq: Once | INTRAVENOUS | Status: AC
  Administered 2017-06-19: 10 mL
  Filled 2017-06-19: qty 10

## 2017-06-19 MED ORDER — SODIUM CHLORIDE 0.9 % IV SOLN
750.0000 mg/m2 | Freq: Once | INTRAVENOUS | Status: AC
  Administered 2017-06-19: 1600 mg via INTRAVENOUS
  Filled 2017-06-19: qty 80

## 2017-06-19 MED ORDER — SODIUM CHLORIDE 0.9 % IV SOLN
375.0000 mg/m2 | Freq: Once | INTRAVENOUS | Status: AC
  Administered 2017-06-19: 800 mg via INTRAVENOUS
  Filled 2017-06-19: qty 50

## 2017-06-19 MED ORDER — SODIUM CHLORIDE 0.9 % IV SOLN
Freq: Once | INTRAVENOUS | Status: AC
  Administered 2017-06-19: 10:00:00 via INTRAVENOUS

## 2017-06-19 MED ORDER — PALONOSETRON HCL INJECTION 0.25 MG/5ML
INTRAVENOUS | Status: AC
  Filled 2017-06-19: qty 5

## 2017-06-19 MED ORDER — DIPHENHYDRAMINE HCL 25 MG PO CAPS
50.0000 mg | ORAL_CAPSULE | Freq: Once | ORAL | Status: AC
  Administered 2017-06-19: 50 mg via ORAL

## 2017-06-19 NOTE — Patient Instructions (Signed)
[facility] Discharge Instructions for Patients Receiving Chemotherapy  Today you received the following chemotherapy agents: Rituximab,Cyclosphosphamide,Doxorubicin and Vincristine  To help prevent nausea and vomiting after your treatment, we encourage you to take your nausea medication as directed  If you develop nausea and vomiting that is not controlled by your nausea medication, call the clinic.   BELOW ARE SYMPTOMS THAT SHOULD BE REPORTED IMMEDIATELY:  *FEVER GREATER THAN 100.5 F  *CHILLS WITH OR WITHOUT FEVER  NAUSEA AND VOMITING THAT IS NOT CONTROLLED WITH YOUR NAUSEA MEDICATION  *UNUSUAL SHORTNESS OF BREATH  *UNUSUAL BRUISING OR BLEEDING  TENDERNESS IN MOUTH AND THROAT WITH OR WITHOUT PRESENCE OF ULCERS  *URINARY PROBLEMS  *BOWEL PROBLEMS  UNUSUAL RASH Items with * indicate a potential emergency and should be followed up as soon as possible.  Feel free to call the clinic should you have any questions or concerns. The clinic phone number is [phone].  Please show the [place] at check-in to the Emergency Department and triage nurse.

## 2017-06-24 DIAGNOSIS — Z7189 Other specified counseling: Secondary | ICD-10-CM | POA: Insufficient documentation

## 2017-06-24 HISTORY — DX: Other specified counseling: Z71.89

## 2017-07-03 NOTE — Progress Notes (Signed)
HEMATOLOGY/ONCOLOGY CLINIC NOTE  Date of Service: 07/09/17  Patient Care Team: Girtha Rm, NP-C as PCP - General (Family Medicine)  CHIEF COMPLAINTS/PURPOSE OF CONSULTATION:  Thoracic and abdominal lymphadenopathy concerning for lymphoproliferative disorder  HISTORY OF PRESENTING ILLNESS:  Bonnie Larsen is a wonderful 37 y.o. female who has been referred to [place] by Dr .Raenette Rover, Laurian Brim, NP-C for evaluation and management of thoracic paraspinal masses and upper abdominal extensive lymphadenopathy.  Patient has a history of uterine fibroids, right breast benign lesion biopsied in 2016, anxiety/panic attacks, migraine headaches and chronic sinus issues who presented to the emergency room brought by EMS for chest pain and shortness of breath about 7-10 days ago. She was noted to be hypertensive with a blood pressure of 180/110 which improved with nitroglycerin. She was also having some back pain and felt like her chest pain was radiating to the back. D-dimer level was borderline elevated. She had a CTA of the chest on 01/06/2017 that showed no evidence of pulmonary embolism. She was however noted to have Multiple prevertebral paraspinal masses, differential diagnosis includes extramedullary hematopoiesis and metastasis/lymphadenopathy. Recommend correlation for laboratory analysis for anemia and lymphoproliferative disease.  Patient was subsequently seen by her primary care physician and had a CT of the abdomen and pelvis with contrast on 01/09/2017 which showed Extensive upper abdominal adenopathy favoring lymphoma/lymphoproliferative disease, tissue diagnosis recommended. No splenomegaly or focal splenic lesion.  She was referred to [place] for further evaluation and management.  She was here with her mother and husband.  She notes no abdominal, or chest trauma. No fevers chills night sweats or unexpected weight loss. Mild constipation but no other change in bowel habits. No GI bleeding  noted. No change in food intake or swallowing. No urinary discomfort or abnormalities. Currently having no chest pain or shortness of breath. Mild upper abdominal discomfort. No other obvious peripheral lymphadenopathy noted.   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 She presents to the clinic today accompanied by her mother. She plans to return to work full time in early February. 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 She is hesitant to return at the moment. She is currently out on short term disability.  Nausea better controlled.  On review of symptoms, pt notes nausea has improved with ativan. She notes lower right abdominal pain yesterday but will subside when she sits down and rests. She notes while on prednisone her appetite has increased.  She denies fever, chill or significant night sweats. She denies bowel movement or urinary issues.   MEDICAL HISTORY:  Past Medical History:  Diagnosis Date   Anxiety    Difficult intravenous access    Family history of adverse reaction to anesthesia    pt's mother has hx. of post-op N/V and hard to wake up post-op   History of panic attacks    02/08/17- has not had a panic attacks   Lymphoma ([place]) 01/2017   Mesenteric lymphadenopathy 01/2017   Runny nose 02/19/2017   clear drainage, per pt.   Seasonal allergies     SURGICAL HISTORY: Past Surgical History:  Procedure Laterality Date   LYMPH NODE BIOPSY N/A 02/11/2017   Procedure: HAND ASSISTED LAPAROSCOPIC LYMPH NODE BIOPSY;  Surgeon: Stark Klein, MD;  Location: [facility];  Service:  General;  Laterality: N/A;   PORTACATH PLACEMENT N/A 02/20/2017   Procedure: INSERTION PORT-A-CATH;  Surgeon: Stark Klein, MD;  Location: [facility];  Service: General;  Laterality: N/A;   TUBAL LIGATION      UMBILICAL HERNIA REPAIR  02/11/2017    SOCIAL HISTORY: Social History   Socioeconomic History   Marital status: Married    Spouse name: Not on file   Number of children: Not on file   Years of education: Not on file   Highest education level: Not on file  Social Needs   Financial resource strain: Not on file   Food insecurity - worry: Not on file   Food insecurity - inability: Not on file   Transportation needs - medical: Not on file   Transportation needs - non-medical: Not on file  Occupational History   Not on file  Tobacco Use   Smoking status: Never Smoker   Smokeless tobacco: Never Used  Substance and Sexual Activity   Alcohol use: No    Alcohol/week: 0.0 oz   Drug use: No   Sexual activity: Yes    Partners: Male    Birth control/protection: IUD    Comment: tubal ligation  Other Topics Concern   Not on file  Social History Narrative   Not on file    FAMILY HISTORY: Family History  Problem Relation Age of Onset   Breast cancer Mother 97   Fibromyalgia Mother    Rheum arthritis Mother    Neuropathy Mother    Anesthesia problems Mother        post-op N/V; hard to wake up post-op   Congestive Heart Failure Father        died at age 23   Cirrhosis Father    Heart disease Maternal Grandmother    Diabetes Maternal Grandmother    Other Brother        Benign spinal tumor   Cancer Paternal Grandfather        Leukemia    ALLERGIES:  is allergic to clindamycin/lincomycin and cymbalta [duloxetine hcl].  MEDICATIONS:  Current Outpatient Medications  Medication Sig Dispense Refill   HYDROcodone-acetaminophen (NORCO) 7.5-325 MG tablet Take 1 tablet by mouth every 6 (six) hours as needed for moderate pain. 30 tablet 0   ketotifen (ZADITOR) 0.025 % ophthalmic solution 1 drop 2 (two) times daily.     levonorgestrel (MIRENA) 20 MCG/24HR IUD 1 each by Intrauterine route once.     lidocaine-prilocaine (EMLA) cream Apply 1 application  topically as needed. 30 g 2   LORazepam (ATIVAN) 0.5 MG tablet Take 1 tablet (0.5 mg total) by mouth every 8 (eight) hours as needed for anxiety. 30 tablet 0   ondansetron (ZOFRAN) 8 MG tablet Take 1 tablet (8 mg total) by mouth 2 (two) times daily as needed for refractory nausea / vomiting. Start on day 3 after chemo. 30 tablet 1   predniSONE (DELTASONE) 20 MG tablet Take 3 tablets (60 mg total) by mouth daily. Take on days 1-5 of chemotherapy. 20 tablet 5   prochlorperazine (COMPAZINE) 10 MG tablet Take 1 tablet (10 mg total) by mouth every 6 (six) hours as needed (Nausea or vomiting). 30 tablet 6   senna (SENOKOT) 8.6 MG TABS tablet Take 1 tablet (8.6 mg total) by mouth 2 (two) times daily. 30 each 1   No current facility-administered medications for this visit.    Facility-Administered Medications Ordered in Other Visits  Medication Dose Route Frequency Provider Last Rate Last Dose  cyclophosphamide (CYTOXAN) 1,600 mg in sodium chloride 0.9 % 250 mL chemo infusion  750 mg/m2 (Treatment Plan Recorded) Intravenous Once Brunetta Genera, MD       DOXOrubicin (ADRIAMYCIN) chemo injection 106 mg  50 mg/m2 (Treatment Plan Recorded) Intravenous Once Brunetta Genera, MD       fosaprepitant (EMEND) 150 mg, dexamethasone (DECADRON) 12 mg in sodium chloride 0.9 % 145 mL IVPB   Intravenous Once Brunetta Genera, MD 454 mL/hr at 07/09/17 1033     heparin lock flush 100 unit/mL  500 Units Intracatheter Once PRN Brunetta Genera, MD       pegfilgrastim (NEULASTA ONPRO KIT) injection 6 mg  6 mg Subcutaneous Once Brunetta Genera, MD       riTUXimab (RITUXAN) 800 mg in sodium chloride 0.9 % 250 mL (2.4242 mg/mL) chemo infusion  375 mg/m2 (Treatment Plan Recorded) Intravenous Once Brunetta Genera, MD       sodium chloride flush (NS) 0.9 % injection 10 mL  10 mL Intracatheter PRN Brunetta Genera, MD   10 mL at 03/26/17 1724   sodium chloride flush (NS) 0.9 % injection  10 mL  10 mL Intravenous PRN Brunetta Genera, MD   10 mL at 04/16/17 1938   sodium chloride flush (NS) 0.9 % injection 10 mL  10 mL Intracatheter PRN Brunetta Genera, MD       vinCRIStine (ONCOVIN) 1 mg in sodium chloride 0.9 % 50 mL chemo infusion  1 mg Intravenous Once Brunetta Genera, MD        REVIEW OF SYSTEMS:    10 Point review of Systems was done is negative except as noted above.  PHYSICAL EXAMINATION:  ECOG PERFORMANCE STATUS: 1 - Symptomatic but completely ambulatory  . Vitals:   07/09/17 0905  BP: 115/75  Pulse: (!) 109  Resp: 18  Temp: 98.6 F (37 C)  SpO2: 100%   Filed Weights   07/09/17 0905  Weight: 228 lb 3.2 oz (103.5 kg)   .Body mass index is 40.42 kg/m.  GENERAL:alert, in no acute distress and comfortable SKIN: no acute rashes, no significant lesions EYES: conjunctiva are pink and non-injected, sclera anicteric OROPHARYNX: MMM, no exudates, no oropharyngeal erythema or ulceration NECK: supple, no JVD. Minimal neck swelling bilaterally, mild on the left sided without focal pain, tenderness or induration.  LYMPH:  no palpable lymphadenopathy in the cervical, axillary or inguinal regions LUNGS: clear to auscultation b/l with normal respiratory effort HEART: regular rate & rhythm ABDOMEN:  Healing surgical scar around the umbilicus. No TTP. No peritoneal signs. Some deep TTP mid/upper abd. Extremity: no pedal edema PSYCH: alert & oriented x 3 with fluent speech NEURO: no focal motor/sensory deficits  LABORATORY DATA:  I have reviewed the data as listed  . CBC    Component Value Date/Time   WBC 3.6 (L) 06/19/2017 0912   WBC 7.5 02/13/2017 0504   RBC 4.33 07/09/2017 0821   HGB 12.3 06/19/2017 0912   HCT 36.5 07/09/2017 0821   HCT 37.6 06/19/2017 0912   PLT 257 06/19/2017 0912   MCV 84.3 07/09/2017 0821   MCV 83.6 06/19/2017 0912   MCH 27.7 07/09/2017 0821   MCHC 32.9 07/09/2017 0821   RDW 15.3 07/09/2017 0821   RDW 16.5 (H)  06/19/2017 0912   LYMPHSABS 0.4 (L) 07/09/2017 0821   LYMPHSABS 0.8 (L) 06/19/2017 0912   MONOABS 0.3 07/09/2017 0821   MONOABS 0.7 06/19/2017 0912   EOSABS 0.0 07/09/2017  8676   EOSABS 0.0 06/19/2017 0912   BASOSABS 0.0 07/09/2017 0821   BASOSABS 0.0 06/19/2017 0912    . CBC Latest Ref Rng & Units 07/09/2017 06/19/2017 05/23/2017  WBC 3.9 - 10.3 10e3/uL - 3.6(L) 4.1  Hemoglobin 11.6 - 15.9 g/dL - 12.3 13.1  Hematocrit 34.8 - 46.6 % 36.5 37.6 40.5  Platelets 145 - 400 10e3/uL - 257 144(L)   ANC 2500  . CMP Latest Ref Rng & Units 07/09/2017 06/19/2017 05/23/2017  Glucose 70 - 140 mg/dL 106 90 103  BUN 7 - 26 mg/dL 11 10.7 8.6  Creatinine 0.60 - 1.10 mg/dL 0.85 0.9 1.0  Sodium 136 - 145 mmol/L 140 141 141  Potassium 3.3 - 4.7 mmol/L 3.7 3.9 4.2  Chloride 98 - 109 mmol/L 107 - -  CO2 22 - 29 mmol/L '24 25 27  ' Calcium 8.4 - 10.4 mg/dL 9.3 9.5 9.6  Total Protein 6.4 - 8.3 g/dL 6.8 6.9 7.3  Total Bilirubin 0.2 - 1.2 mg/dL 0.3 0.32 0.42  Alkaline Phos 40 - 150 U/L 39(L) 35(L) 44  AST 5 - 34 U/L '17 17 23  ' ALT 0 - 55 U/L '13 15 17   ' Component     Latest Ref Rng & Units 01/14/2017  LDH     125 - 245 U/L 193  HIV Screen 4th Generation wRfx     Non Reactive Non Reactive  Hep C Virus Ab     0.0 - 0.9 s/co ratio <0.1  hCG,Beta Subunit,Qual,Serum     Negative <6 mIU/mL Negative  Sed Rate     0 - 32 mm/hr 7   Lymphnode biopsy, 02/11/2017 Diagnosis Lymph node for lymphoma, Mesenteric Lymphadenopathy - HODGKIN LYMPHOMA.         PROCEDURES  ECHO 03/13/17 Study Conclusions - Left ventricle: The cavity size was normal. Systolic function was   normal. The estimated ejection fraction was in the range of 55%   to 60%. Wall motion was normal; there were no regional wall   motion abnormalities. Left ventricular diastolic function   parameters were normal. - Atrial septum: No defect or patent foramen ovale was identified. - Impressions: Normal GLS -20.2.   RADIOGRAPHIC  STUDIES: I have personally reviewed the radiological images as listed and agreed with the findings in the report. No results found. NM PET Image Initial (PI) Skull Base To Thigh (Accession [id number]) (Order [id number])  Imaging  Date: 02/01/2017 Department: [facility]-NU[facility] Released By: Hilda Lias Authorizing: Brunetta Genera, MD  Exam Information   Status Exam Begun  Exam Ended   Final [99] 02/01/2017 1:34 PM 02/01/2017 3:02 PM  PACS Images   Show images for NM PET Image Initial (PI) Skull Base To Thigh  Study Result   CLINICAL DATA:  Initial treatment strategy for abdominal and thoracic lymphadenopathy on recent CT scans.  EXAM: NUCLEAR MEDICINE PET SKULL BASE TO THIGH  TECHNIQUE: 10.8 mCi F-18 FDG was injected intravenously. Full-ring PET imaging was performed from the skull base to thigh after the radiotracer. CT data was obtained and used for attenuation correction and anatomic localization.  FASTING BLOOD GLUCOSE:  Value: 91 mg/dl  COMPARISON:  CT scans dated 01/09/2017  FINDINGS: NECK  No hypermetabolic lymph nodes in the neck.  CHEST  Right paratracheal, subcarinal, paraesophageal, and para aortic/pleural soft tissue density nodules are identified in the chest, most of which demonstrate relatively low-grade in activity. A hypermetabolic periaortic lymph node on image 79/4 has a maximum SUV  of 4.7 which is fairly representative of the other lymph nodes in the chest.  A smooth oval-shaped right breast lesion measuring 4.0 by 1.8 cm has a maximum SUV of 3.4.  The lungs appear clear.  ABDOMEN/PELVIS  Extensive bulky retroperitoneal, mesenteric, porta hepatis, retrocrural, and peripancreatic adenopathy observed. A mesenteric lymph node on image 130/4 measures 3.5 cm in short axis and has a maximum standard uptake value of 12.1. A lymph node above the pancreatic body measures 3.6 cm in short axis on image  102/4 and has a maximum SUV of 13.2. New  No splenomegaly. No focal splenic or hepatic lesion. The extensive peripancreatic nodal disease makes it difficult to completely exclude a pancreatic lesion although I am skeptical of a pancreatic parenchymal lesion.  Non rotated right kidney. 5.4 by 4.7 cm right right for uterine adnexal cystic lesion appears photopenic. Fairly empty urinary bladder with flattened appearance causing several adjacent collections of urine containing FDG ; this simulates an anterior uterine lesion but there is no CT correlate on either today's exam or the 01/09/2017 exam to suggest that there is a true uterine lesion.  SKELETON  Low-grade diffuse mildly accentuated activity throughout the skeleton but without a focal skeletal lesion identified.  IMPRESSION: 1. Hypermetabolic retroperitoneal, mesenteric, peripancreatic, porta hepatis, and retrocrural adenopathy. 2. Nodularity in the left paraspinal, subcarinal, and paraesophageal regions in the chest as lower but still abnormal metabolic activity, and probably represents adenopathy rather than extramedullary hematopoiesis. There is also a faintly metabolic lesion in the right breast which probably represents the same lesion biopsied in 2016, with benign results. 3. Photopenic cystic right adnexal lesion. An IUD is present in the uterus.   Electronically Signed   By: Van Clines M.D.   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 -PET/CT showed fairly active disease. Abdominal disease if progression occurs could potentially cause organ injury. -LDH level and sedimentation rate within normal  limits . -HIV negative  -Hepatitis C negative.  -Baseline ECHO from 03/13/17 shows normal heart function, EF 55%-60%  Rpt PET/CT on 05/29/2017 - showed Significant reduction in size and metabolic activity of the formerly extensive adenopathy in the chest and abdomen, with primarily Deauville 3 activity today, and some Deauville 2 activity. Previously this was Deauville 5.  2) Neutropenia - due to non delivery of neulasta on pro (Device mechanically came off prior to delivery of medication) - now resolved.  3) grade 1 chemotherapy related neuropathy  4) Grade 1 chemotherapy related nausea - better controlled with addition of Emend to the pre-medications. Plan -labs are stable and patient reports no significant prohibitive toxicities at this time -appropriate to proceed for cycle 6 of R-CHOP today -famotidine or omeprazole OTC for GERD. -continue current supportive medications -Will do a PET scan in 4 weeks post R-CHOP to evaluate response to planned induction chemotherapy.  -If no further treatment needed she would be fine to remove port at that time.   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   6) Grade 1 neuropathy -Likely due to Vincristine with her infusion. Stable/improved on evaluation today -continue taking Vitamin B complex with meals to assist with this.   -previously started on Cymbalta 85m po daily. Pt recently d/c Cymbalta due to increase in nausea.    -Reduced dose of Vincristine for C6  PLAN  -Continue Omeprazole to aid gastritis -D/c Cymbalta due to intolerance  -Refill Ativan and NORCO today  -We discussed pt's return to work next month. I advised her to pace herself and if she needs more time off work a letter can be provided.    Proceed with C6 R-CHOP today  PET/CT in 4 weeks RTC with Dr KIrene Limboin 5 weeks with labs   All of the patients questions were answered with apparent satisfaction. The patient knows to call the clinic with any problems, questions or concerns.  I spent 20 minutes counseling the patient face to face. The total time spent in the appointment was 25 minutes and more than 50% was on counseling and direct patient cares.    GSullivan LoneMD MKeenesAAHIVMS SSt Luke HospitalCFroedtert Surgery Center LLCHematology/Oncology Physician [facility] (Office):       [phone](Work cell):  [phone](Fax):           [phone] This document serves as a record of services personally performed by GSullivan Lone MD. It was created on his behalf by AJoslyn Devon a trained medical scribe. The creation of this record is based on the scribe's personal observations and the provider's statements to them.    .I have reviewed the above documentation for accuracy and completeness, and I agree with the above. .Brunetta GeneraMD MS  .GBrunetta GeneraMD MS

## 2017-07-09 ENCOUNTER — Inpatient Hospital Stay: Payer: 59

## 2017-07-09 ENCOUNTER — Other Ambulatory Visit: Payer: Self-pay

## 2017-07-09 ENCOUNTER — Inpatient Hospital Stay: Payer: 59 | Attending: Hematology

## 2017-07-09 ENCOUNTER — Encounter: Payer: Self-pay | Admitting: Hematology

## 2017-07-09 ENCOUNTER — Other Ambulatory Visit: Payer: Self-pay | Admitting: *Deleted

## 2017-07-09 ENCOUNTER — Inpatient Hospital Stay (HOSPITAL_BASED_OUTPATIENT_CLINIC_OR_DEPARTMENT_OTHER): Payer: 59 | Admitting: Hematology

## 2017-07-09 VITALS — BP 118/71 | HR 79 | Temp 98.6°F | Resp 16

## 2017-07-09 VITALS — BP 115/75 | HR 109 | Temp 98.6°F | Resp 18 | Ht 63.0 in | Wt 228.2 lb

## 2017-07-09 DIAGNOSIS — R110 Nausea: Secondary | ICD-10-CM

## 2017-07-09 DIAGNOSIS — Z5189 Encounter for other specified aftercare: Secondary | ICD-10-CM | POA: Insufficient documentation

## 2017-07-09 DIAGNOSIS — Z7189 Other specified counseling: Secondary | ICD-10-CM

## 2017-07-09 DIAGNOSIS — Z5111 Encounter for antineoplastic chemotherapy: Secondary | ICD-10-CM | POA: Insufficient documentation

## 2017-07-09 DIAGNOSIS — Z5112 Encounter for antineoplastic immunotherapy: Secondary | ICD-10-CM | POA: Diagnosis not present

## 2017-07-09 DIAGNOSIS — C8108 Nodular lymphocyte predominant Hodgkin lymphoma, lymph nodes of multiple sites: Secondary | ICD-10-CM

## 2017-07-09 DIAGNOSIS — T451X5A Adverse effect of antineoplastic and immunosuppressive drugs, initial encounter: Secondary | ICD-10-CM

## 2017-07-09 DIAGNOSIS — G620 Drug-induced polyneuropathy: Secondary | ICD-10-CM | POA: Diagnosis not present

## 2017-07-09 DIAGNOSIS — F419 Anxiety disorder, unspecified: Secondary | ICD-10-CM | POA: Insufficient documentation

## 2017-07-09 DIAGNOSIS — C8299 Follicular lymphoma, unspecified, extranodal and solid organ sites: Secondary | ICD-10-CM

## 2017-07-09 LAB — COMPREHENSIVE METABOLIC PANEL
ALBUMIN: 3.8 g/dL (ref 3.5–5.0)
ALK PHOS: 39 U/L — AB (ref 40–150)
ALT: 13 U/L (ref 0–55)
ANION GAP: 9 (ref 3–11)
AST: 17 U/L (ref 5–34)
BILIRUBIN TOTAL: 0.3 mg/dL (ref 0.2–1.2)
BUN: 11 mg/dL (ref 7–26)
CALCIUM: 9.3 mg/dL (ref 8.4–10.4)
CO2: 24 mmol/L (ref 22–29)
Chloride: 107 mmol/L (ref 98–109)
Creatinine, Ser: 0.85 mg/dL (ref 0.60–1.10)
Glucose, Bld: 106 mg/dL (ref 70–140)
POTASSIUM: 3.7 mmol/L (ref 3.3–4.7)
Sodium: 140 mmol/L (ref 136–145)
TOTAL PROTEIN: 6.8 g/dL (ref 6.4–8.3)

## 2017-07-09 LAB — CBC WITH DIFFERENTIAL (CANCER CENTER ONLY)
BASOS ABS: 0 10*3/uL (ref 0.0–0.1)
BASOS PCT: 0 %
Eosinophils Absolute: 0 10*3/uL (ref 0.0–0.5)
Eosinophils Relative: 1 %
HCT: 36.5 % (ref 34.8–46.6)
HEMOGLOBIN: 12 g/dL (ref 11.6–15.9)
Lymphocytes Relative: 12 %
Lymphs Abs: 0.4 10*3/uL — ABNORMAL LOW (ref 0.9–3.3)
MCH: 27.7 pg (ref 25.1–34.0)
MCHC: 32.9 g/dL (ref 31.5–36.0)
MCV: 84.3 fL (ref 79.5–101.0)
MONOS PCT: 8 %
Monocytes Absolute: 0.3 10*3/uL (ref 0.1–0.9)
NEUTROS ABS: 2.7 10*3/uL (ref 1.5–6.5)
NEUTROS PCT: 79 %
Platelet Count: 234 10*3/uL (ref 145–400)
RBC: 4.33 MIL/uL (ref 3.70–5.45)
RDW: 15.3 % (ref 11.2–16.1)
WBC Count: 3.4 10*3/uL — ABNORMAL LOW (ref 3.9–10.3)

## 2017-07-09 LAB — VITAMIN B12: Vitamin B-12: 1012 pg/mL — ABNORMAL HIGH (ref 180–914)

## 2017-07-09 MED ORDER — SODIUM CHLORIDE 0.9 % IV SOLN
Freq: Once | INTRAVENOUS | Status: AC
  Administered 2017-07-09: 11:00:00 via INTRAVENOUS
  Filled 2017-07-09: qty 5

## 2017-07-09 MED ORDER — PALONOSETRON HCL INJECTION 0.25 MG/5ML
INTRAVENOUS | Status: AC
  Filled 2017-07-09: qty 5

## 2017-07-09 MED ORDER — FAMOTIDINE IN NACL 20-0.9 MG/50ML-% IV SOLN
INTRAVENOUS | Status: AC
  Filled 2017-07-09: qty 50

## 2017-07-09 MED ORDER — HEPARIN SOD (PORK) LOCK FLUSH 100 UNIT/ML IV SOLN
500.0000 [IU] | Freq: Once | INTRAVENOUS | Status: AC | PRN
  Administered 2017-07-09: 500 [IU]
  Filled 2017-07-09: qty 5

## 2017-07-09 MED ORDER — LORAZEPAM 1 MG PO TABS
ORAL_TABLET | ORAL | Status: AC
  Filled 2017-07-09: qty 1

## 2017-07-09 MED ORDER — HYDROCODONE-ACETAMINOPHEN 7.5-325 MG PO TABS: 1.0000 | ORAL_TABLET | Freq: Four times a day (QID) | ORAL | 0 refills | Status: DC | PRN

## 2017-07-09 MED ORDER — PROMETHAZINE HCL 25 MG PO TABS
12.5000 mg | ORAL_TABLET | Freq: Once | ORAL | Status: AC
  Administered 2017-07-09: 12.5 mg via ORAL

## 2017-07-09 MED ORDER — LORAZEPAM 0.5 MG PO TABS: 0.5000 mg | ORAL_TABLET | Freq: Three times a day (TID) | ORAL | 0 refills | Status: DC | PRN

## 2017-07-09 MED ORDER — PALONOSETRON HCL INJECTION 0.25 MG/5ML
0.2500 mg | Freq: Once | INTRAVENOUS | Status: AC
  Administered 2017-07-09: 0.25 mg via INTRAVENOUS

## 2017-07-09 MED ORDER — FAMOTIDINE IN NACL 20-0.9 MG/50ML-% IV SOLN
20.0000 mg | Freq: Once | INTRAVENOUS | Status: AC
  Administered 2017-07-09: 20 mg via INTRAVENOUS

## 2017-07-09 MED ORDER — PEGFILGRASTIM 6 MG/0.6ML ~~LOC~~ PSKT
6.0000 mg | PREFILLED_SYRINGE | Freq: Once | SUBCUTANEOUS | Status: AC
  Administered 2017-07-09: 6 mg via SUBCUTANEOUS

## 2017-07-09 MED ORDER — SODIUM CHLORIDE 0.9% FLUSH
10.0000 mL | Freq: Once | INTRAVENOUS | Status: AC
  Administered 2017-07-09: 10 mL
  Filled 2017-07-09: qty 10

## 2017-07-09 MED ORDER — ACETAMINOPHEN 325 MG PO TABS
ORAL_TABLET | ORAL | Status: AC
Start: 2017-07-09 — End: 2017-07-09
  Filled 2017-07-09: qty 2

## 2017-07-09 MED ORDER — DOXORUBICIN HCL CHEMO IV INJECTION 2 MG/ML
50.0000 mg/m2 | Freq: Once | INTRAVENOUS | Status: AC
  Administered 2017-07-09: 106 mg via INTRAVENOUS
  Filled 2017-07-09: qty 53

## 2017-07-09 MED ORDER — DIPHENHYDRAMINE HCL 25 MG PO CAPS
ORAL_CAPSULE | ORAL | Status: AC
  Filled 2017-07-09: qty 2

## 2017-07-09 MED ORDER — VINCRISTINE SULFATE CHEMO INJECTION 1 MG/ML
1.0000 mg | Freq: Once | INTRAVENOUS | Status: AC
  Administered 2017-07-09: 1 mg via INTRAVENOUS
  Filled 2017-07-09: qty 1

## 2017-07-09 MED ORDER — LORAZEPAM 1 MG PO TABS
0.5000 mg | ORAL_TABLET | Freq: Once | ORAL | Status: AC
  Administered 2017-07-09: 0.5 mg via ORAL

## 2017-07-09 MED ORDER — SODIUM CHLORIDE 0.9 % IV SOLN
375.0000 mg/m2 | Freq: Once | INTRAVENOUS | Status: AC
  Administered 2017-07-09: 800 mg via INTRAVENOUS
  Filled 2017-07-09: qty 50

## 2017-07-09 MED ORDER — SODIUM CHLORIDE 0.9 % IV SOLN
Freq: Once | INTRAVENOUS | Status: AC
  Administered 2017-07-09: 10:00:00 via INTRAVENOUS

## 2017-07-09 MED ORDER — HEPARIN SOD (PORK) LOCK FLUSH 100 UNIT/ML IV SOLN
500.0000 [IU] | Freq: Once | INTRAVENOUS | Status: DC
  Filled 2017-07-09: qty 5

## 2017-07-09 MED ORDER — ACETAMINOPHEN 325 MG PO TABS
650.0000 mg | ORAL_TABLET | Freq: Once | ORAL | Status: AC
  Administered 2017-07-09: 650 mg via ORAL

## 2017-07-09 MED ORDER — PEGFILGRASTIM 6 MG/0.6ML ~~LOC~~ PSKT
PREFILLED_SYRINGE | SUBCUTANEOUS | Status: AC
Start: 2017-07-09 — End: 2017-07-09
  Filled 2017-07-09: qty 0.6

## 2017-07-09 MED ORDER — PROMETHAZINE HCL 25 MG PO TABS
ORAL_TABLET | ORAL | Status: AC
  Filled 2017-07-09: qty 1

## 2017-07-09 MED ORDER — SODIUM CHLORIDE 0.9% FLUSH
10.0000 mL | INTRAVENOUS | Status: DC | PRN
  Administered 2017-07-09: 10 mL
  Filled 2017-07-09: qty 10

## 2017-07-09 MED ORDER — SODIUM CHLORIDE 0.9 % IV SOLN
750.0000 mg/m2 | Freq: Once | INTRAVENOUS | Status: AC
  Administered 2017-07-09: 1600 mg via INTRAVENOUS
  Filled 2017-07-09: qty 80

## 2017-07-09 MED ORDER — PROMETHAZINE HCL 25 MG PO TABS: 12.5000 mg | ORAL_TABLET | Freq: Once | ORAL | Status: DC

## 2017-07-09 MED ORDER — DIPHENHYDRAMINE HCL 25 MG PO CAPS
50.0000 mg | ORAL_CAPSULE | Freq: Once | ORAL | Status: AC
  Administered 2017-07-09: 50 mg via ORAL

## 2017-07-09 NOTE — Progress Notes (Signed)
During treatment, patient began c/o nausea. Medicated per orders. At time of discharge, patient verbalized complete resolution of symptoms.

## 2017-07-09 NOTE — Patient Instructions (Signed)
[facility] Discharge Instructions for Patients Receiving Chemotherapy  Today you received the following chemotherapy agents:  Adriamycin, Vincristine, Cytoxan, and Rituxan.  To help prevent nausea and vomiting after your treatment, we encourage you to take your nausea medication as directed.   If you develop nausea and vomiting that is not controlled by your nausea medication, call the clinic.   BELOW ARE SYMPTOMS THAT SHOULD BE REPORTED IMMEDIATELY:  *FEVER GREATER THAN 100.5 F  *CHILLS WITH OR WITHOUT FEVER  NAUSEA AND VOMITING THAT IS NOT CONTROLLED WITH YOUR NAUSEA MEDICATION  *UNUSUAL SHORTNESS OF BREATH  *UNUSUAL BRUISING OR BLEEDING  TENDERNESS IN MOUTH AND THROAT WITH OR WITHOUT PRESENCE OF ULCERS  *URINARY PROBLEMS  *BOWEL PROBLEMS  UNUSUAL RASH Items with * indicate a potential emergency and should be followed up as soon as possible.  Feel free to call the clinic should you have any questions or concerns. The clinic phone number is [phone].  Please show the CHEMO ALERT CARD at check-in to the Emergency Department and triage nurse.   

## 2017-07-09 NOTE — Patient Instructions (Signed)
Implanted Port Home Guide An implanted port is a type of central line that is placed under the skin. Central lines are used to provide IV access when treatment or nutrition needs to be given through a person's veins. Implanted ports are used for long-term IV access. An implanted port may be placed because:  You need IV medicine that would be irritating to the small veins in your hands or arms.  You need long-term IV medicines, such as antibiotics.  You need IV nutrition for a long period.  You need frequent blood draws for lab tests.  You need dialysis.  Implanted ports are usually placed in the chest area, but they can also be placed in the upper arm, the abdomen, or the leg. An implanted port has two main parts:  Reservoir. The reservoir is round and will appear as a small, raised area under your skin. The reservoir is the part where a needle is inserted to give medicines or draw blood.  Catheter. The catheter is a thin, flexible tube that extends from the reservoir. The catheter is placed into a large vein. Medicine that is inserted into the reservoir goes into the catheter and then into the vein.  How will I care for my incision site? Do not get the incision site wet. Bathe or shower as directed by your health care provider. How is my port accessed? Special steps must be taken to access the port:  Before the port is accessed, a numbing cream can be placed on the skin. This helps numb the skin over the port site.  Your health care provider uses a sterile technique to access the port. ? Your health care provider must put on a mask and sterile gloves. ? The skin over your port is cleaned carefully with an antiseptic and allowed to dry. ? The port is gently pinched between sterile gloves, and a needle is inserted into the port.  Only "non-coring" port needles should be used to access the port. Once the port is accessed, a blood return should be checked. This helps ensure that the port  is in the vein and is not clogged.  If your port needs to remain accessed for a constant infusion, a clear (transparent) bandage will be placed over the needle site. The bandage and needle will need to be changed every week, or as directed by your health care provider.  Keep the bandage covering the needle clean and dry. Do not get it wet. Follow your health care provider's instructions on how to take a shower or bath while the port is accessed.  If your port does not need to stay accessed, no bandage is needed over the port.  What is flushing? Flushing helps keep the port from getting clogged. Follow your health care provider's instructions on how and when to flush the port. Ports are usually flushed with saline solution or a medicine called heparin. The need for flushing will depend on how the port is used.  If the port is used for intermittent medicines or blood draws, the port will need to be flushed: ? After medicines have been given. ? After blood has been drawn. ? As part of routine maintenance.  If a constant infusion is running, the port may not need to be flushed.  How long will my port stay implanted? The port can stay in for as long as your health care provider thinks it is needed. When it is time for the port to come out, surgery will be   done to remove it. The procedure is similar to the one performed when the port was put in. When should I seek immediate medical care? When you have an implanted port, you should seek immediate medical care if:  You notice a bad smell coming from the incision site.  You have swelling, redness, or drainage at the incision site.  You have more swelling or pain at the port site or the surrounding area.  You have a fever that is not controlled with medicine.  This information is not intended to replace advice given to you by your health care provider. Make sure you discuss any questions you have with your health care provider. Document  Released: 06/11/2005 Document Revised: 11/17/2015 Document Reviewed: 02/16/2013 Elsevier Interactive Patient Education  2017 [employer]vier Inc.  

## 2017-07-10 LAB — HEPATITIS B CORE ANTIBODY, TOTAL: Hep B Core Total Ab: NEGATIVE

## 2017-07-10 LAB — HEPATITIS B SURFACE ANTIGEN: HEP B S AG: NEGATIVE

## 2017-07-10 LAB — HEPATITIS B SURFACE ANTIBODY,QUALITATIVE: HEP B S AB: NONREACTIVE

## 2017-07-11 ENCOUNTER — Telehealth: Payer: Self-pay

## 2017-07-11 NOTE — Telephone Encounter (Signed)
Confirmed availability of pt for scheduled appointments in February. 2/12 at 0800 for PET scan. Pt to arrive at 0730 and eat nothing for 6 hours prior. 2/20 lab and doctor visit at 1300. Pt verbalized understanding and agreement to come to appointments as scheduled. Pt questioned potential need to stay out of work until March or April and when she would be able to get her port removed. Will discuss with Dr. [name]. Pt to return call tomorrow with contact information for her employer.

## 2017-07-17 ENCOUNTER — Encounter: Payer: Self-pay | Admitting: *Deleted

## 2017-07-17 ENCOUNTER — Telehealth: Payer: Self-pay | Admitting: *Deleted

## 2017-07-17 NOTE — Telephone Encounter (Signed)
Pt called to give fax number and claim number for insurance asking we fax a letter for her to stay out of work through the beginning of April.  Per Dr. [name], pt does not have enough limitations to legally write patient out of work through April.  Pt may have limitations/restrictions such as needing shortened days or frequent rest breaks.  Dr. [name] request patient obtain form for [place] to fill out to specify certain limitations/restrictions.  Reviewed with patient.  Pt verbalized understanding.  Fax number provided.

## 2017-07-22 ENCOUNTER — Encounter: Payer: Self-pay | Admitting: *Deleted

## 2017-08-06 ENCOUNTER — Ambulatory Visit (HOSPITAL_COMMUNITY)
Admission: RE | Admit: 2017-08-06 | Discharge: 2017-08-06 | Disposition: A | Discharge status: Home / Self Care | Payer: 59 | Source: Ambulatory Visit | Attending: Hematology | Admitting: Hematology

## 2017-08-06 DIAGNOSIS — D259 Leiomyoma of uterus, unspecified: Secondary | ICD-10-CM | POA: Insufficient documentation

## 2017-08-06 DIAGNOSIS — C8108 Nodular lymphocyte predominant Hodgkin lymphoma, lymph nodes of multiple sites: Secondary | ICD-10-CM | POA: Diagnosis present

## 2017-08-06 LAB — GLUCOSE, CAPILLARY: GLUCOSE-CAPILLARY: 103 mg/dL — AB (ref 65–99)

## 2017-08-06 IMAGING — PT NM PET TUM IMG RESTAG (PS) SKULL BASE T - THIGH
1 series · 5 of 5 positions shown · non-contrast
Comparison: None.

CLINICAL DATA: Subsequent treatment strategy for classic Hodgkin's
lymphoma. Stage [DATE]. Patient status post induction chemotherapy..

EXAM:
NUCLEAR MEDICINE PET SKULL BASE TO THIGH
TECHNIQUE: 11.4 mCi F-18 FDG was injected intravenously. Full-ring PET imaging
was performed from the skull base to thigh after the radiotracer. CT
data was obtained and used for attenuation correction and anatomic
localization.
FASTING BLOOD GLUCOSE:  Value: 103 mg/dl

[Series 1169: results mm oncology reading · 1.32mm/px · 5 of 5 slices shown]
[im 1/5]
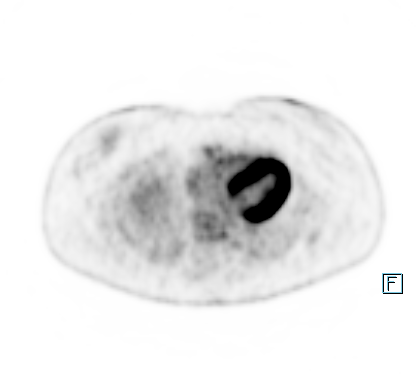
[im 2/5]
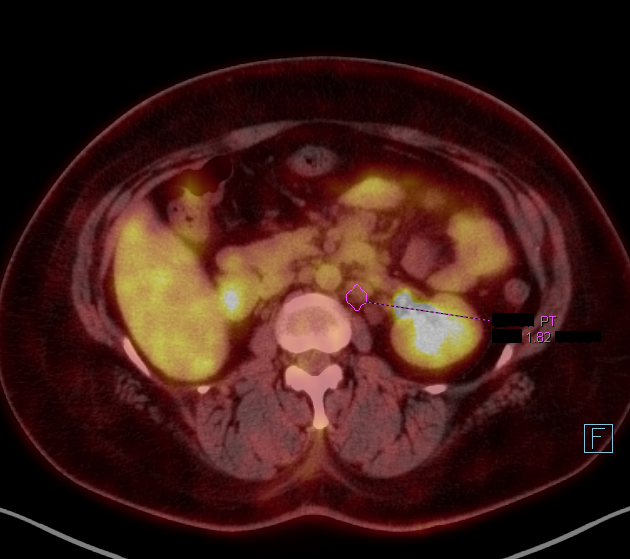
[im 3/5]
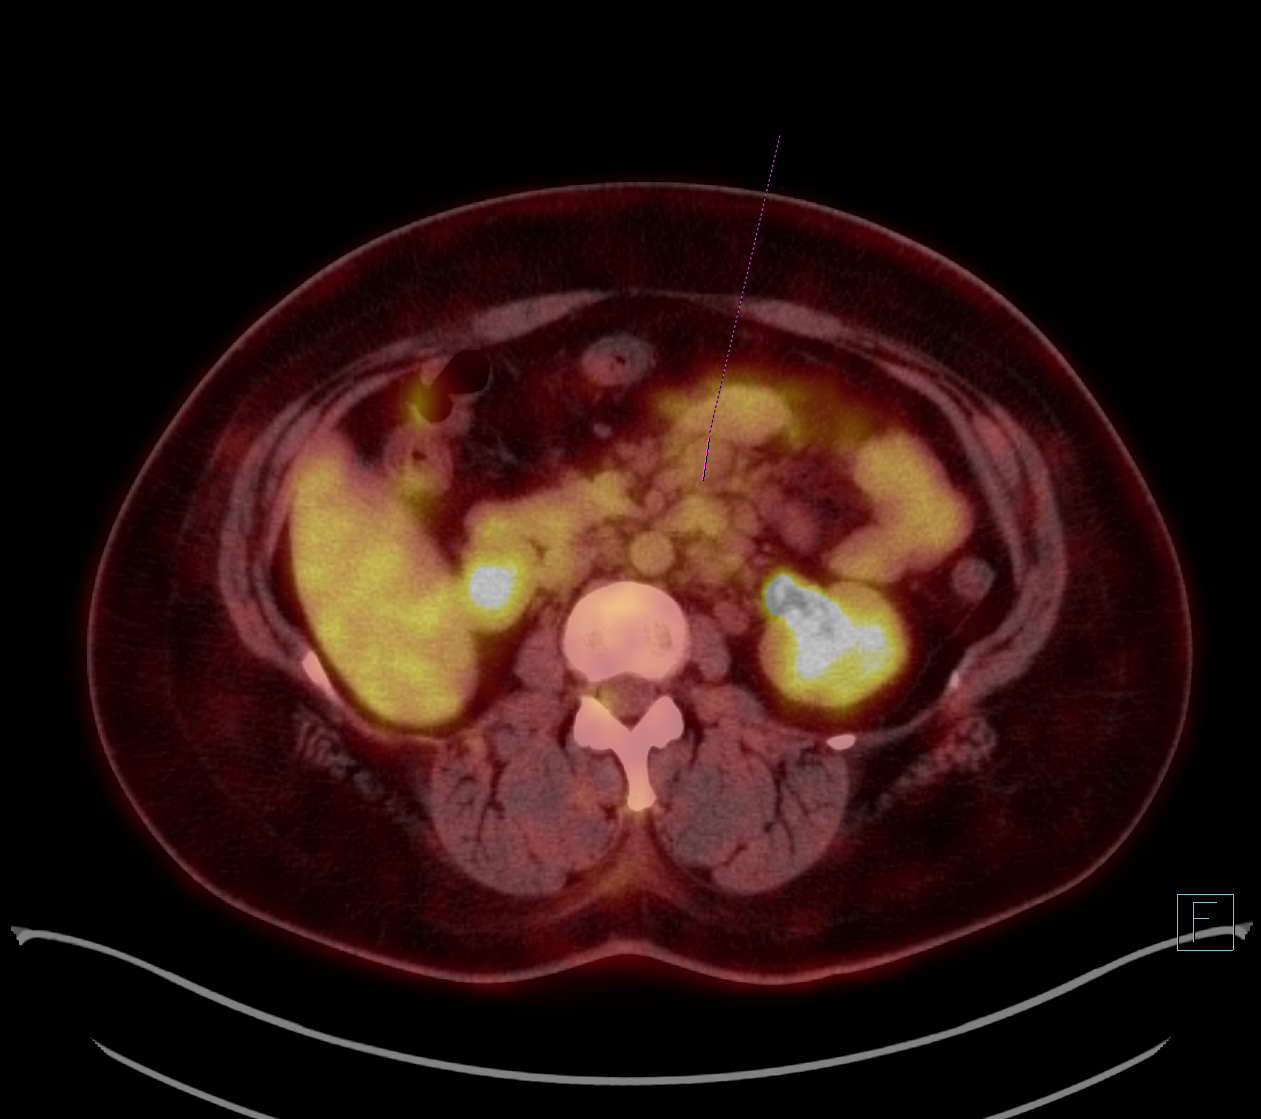
[im 4/5]
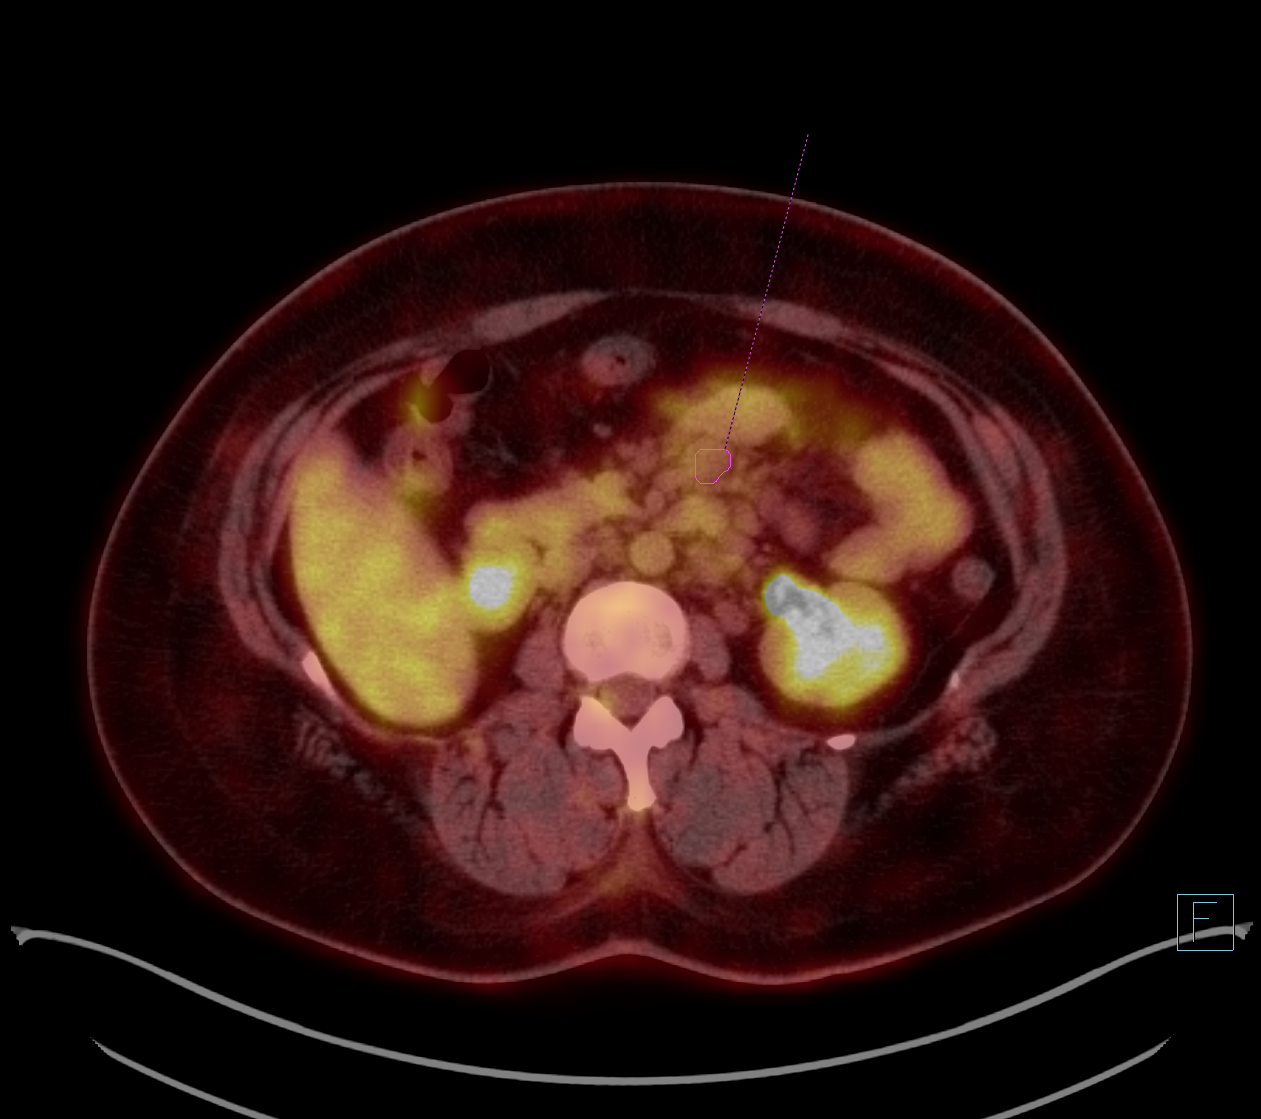
[im 5/5]
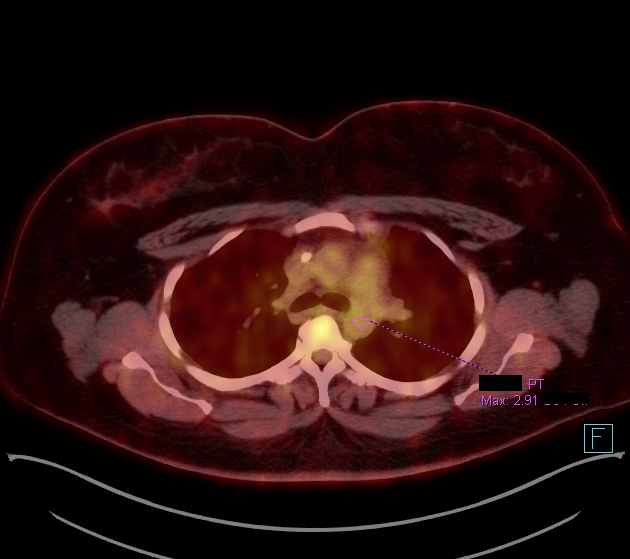

[5 of 5 positions shown; findings below may reference images not displayed]

FINDINGS: NECK

No hypermetabolic lymph nodes in the neck.

CHEST

No hypermetabolic mediastinal or hilar nodes. No residual activity
in the mediastinal lymph nodes ([HOSPITAL] 1). No suspicious
pulmonary nodules on the CT scan.

ABDOMEN/PELVIS

No significant residual activity within periaortic lymph nodes
activity. For example small LEFT periaortic lymph node SUV max
which is less than mediastinal blood pool. Example central
mesenteric lymph node measures 1.5 cm short axis (118, series 4 SUV
max)

No abnormal activity the spleen is. Abnormal activity pelvic lymph
nodes.

Intensely hypermetabolic lesion in the anterior uterine body most
consistent IUD expected location

SKELETON

No focal hypermetabolic activity to suggest skeletal metastasis.
IMPRESSION: 1. No evidence active lymphoma with minimal activity within the
remaining periaortic and mesenteric lymph nodes - [HOSPITAL] 2).
2. Hypermetabolic leiomyoma within the uterus.

## 2017-08-06 MED ORDER — FLUDEOXYGLUCOSE F - 18 (FDG) INJECTION
11.4000 | Freq: Once | INTRAVENOUS | Status: AC | PRN
  Administered 2017-08-06: 11.4 via INTRAVENOUS

## 2017-08-12 NOTE — Progress Notes (Signed)
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 female who has been referred to [place] by Dr .No primary care provider on file. for evaluation and management of thoracic paraspinal masses and upper abdominal extensive lymphadenopathy.  Patient has a history of uterine fibroids, right breast benign lesion biopsied in 2016, anxiety/panic attacks, migraine headaches and chronic sinus issues who presented to the emergency room brought by EMS for chest pain and shortness of breath about 7-10 days ago. She was noted to be hypertensive with a blood pressure of 180/110 which improved with nitroglycerin. She was also having some back pain and felt like her chest pain was radiating to the back. D-dimer level was borderline elevated. She had a CTA of the chest on 01/06/2017 that showed no evidence of pulmonary embolism. She was however noted to have Multiple prevertebral paraspinal masses, differential diagnosis includes extramedullary hematopoiesis and metastasis/lymphadenopathy. Recommend correlation for laboratory analysis for anemia and lymphoproliferative disease.  Patient was subsequently seen by her primary care physician and had a CT of the abdomen and pelvis with contrast on 01/09/2017 which showed Extensive upper abdominal adenopathy favoring lymphoma/lymphoproliferative disease, tissue diagnosis recommended. No splenomegaly or focal splenic lesion.  She was referred to [place] for further evaluation and management.  She was here with her mother and husband.  She notes no abdominal, or chest trauma. No fevers chills night sweats or unexpected weight loss. Mild constipation but no other change in bowel habits. No GI bleeding noted. No change in food intake or  swallowing. No urinary discomfort or abnormalities. Currently having no chest pain or shortness of breath. Mild upper abdominal discomfort. No other obvious peripheral lymphadenopathy noted.   CURRENT THERAPY:    R-CHOP x 6 cycles (Fanale et al 2010)   INTERVAL HISTORY   Bonnie Larsen is here for f/u of nodular lymphocyte predominant Hodgkins lymphoma s/p 6 cycles of R-CHOP chemotherapy. She notes no acute new symptoms. Abdominal discomfort resolved. Has had some anxiety issues.Given psychologist referral and addressed her anxieties regarding her diagnosis and treatment.  Of note since the patient last visit, pt has had PET completed on 08/06/17 with results revealing 1. No evidence active lymphoma with minimal activity within the remaining periaortic and mesenteric lymph nodes - Deauville 2. Hypermetabolic leiomyoma within the uterus.  She is understandably glad that she is in remission from her lymphoma. I stressed importance of staying active and following healthy diet.  We discussed NCCN guidelines for surveillance and f/u for hodgkins lymphoma  MEDICAL HISTORY:  Past Medical History:  Diagnosis Date  . Anxiety   . Difficult intravenous access   . Family history of adverse reaction to anesthesia    pt's mother has hx. of post-op N/V and hard to wake up post-op  . History of panic attacks    02/08/17- has not had a panic attacks  . Lymphoma ([place]) 01/2017  . Mesenteric lymphadenopathy 01/2017  . Runny nose 02/19/2017   clear drainage, per pt.  . Seasonal allergies     SURGICAL HISTORY: Past Surgical History:  Procedure Laterality Date  . LYMPH NODE BIOPSY N/A 02/11/2017   Procedure: HAND ASSISTED LAPAROSCOPIC LYMPH NODE BIOPSY;  Surgeon: Stark Klein, MD;  Location: [facility];  Service: General;  Laterality: N/A;  . PORTACATH  PLACEMENT N/A 02/20/2017   Procedure: INSERTION PORT-A-CATH;  Surgeon: Stark Klein, MD;  Location: [facility];  Service: General;   Laterality: N/A;  . TUBAL LIGATION    . UMBILICAL HERNIA REPAIR  02/11/2017    SOCIAL HISTORY: Social History   Socioeconomic History  . Marital status: Married    Spouse name: Not on file  . Number of children: Not on file  . Years of education: Not on file  . Highest education level: Not on file  Social Needs  . Financial resource strain: Not on file  . Food insecurity - worry: Not on file  . Food insecurity - inability: Not on file  . Transportation needs - medical: Not on file  . Transportation needs - non-medical: Not on file  Occupational History  . Not on file  Tobacco Use  . Smoking status: Never Smoker  . Smokeless tobacco: Never Used  Substance and Sexual Activity  . Alcohol use: No    Alcohol/week: 0.0 oz  . Drug use: No  . Sexual activity: Yes    Partners: Male    Birth control/protection: IUD    Comment: tubal ligation  Other Topics Concern  . Not on file  Social History Narrative  . Not on file    FAMILY HISTORY: Family History  Problem Relation Age of Onset  . Breast cancer Mother 12  . Fibromyalgia Mother   . Rheum arthritis Mother   . Neuropathy Mother   . Anesthesia problems Mother        post-op N/V; hard to wake up post-op  . Congestive Heart Failure Father        died at age 91  . Cirrhosis Father   . Heart disease Maternal Grandmother   . Diabetes Maternal Grandmother   . Other Brother        Benign spinal tumor  . Cancer Paternal Grandfather        Leukemia    ALLERGIES:  is allergic to clindamycin/lincomycin and cymbalta [duloxetine hcl].  MEDICATIONS:  Current Outpatient Medications  Medication Sig Dispense Refill  . HYDROcodone-acetaminophen (NORCO) 7.5-325 MG tablet Take 1 tablet by mouth every 6 (six) hours as needed for moderate pain. 30 tablet 0  . ketotifen (ZADITOR) 0.025 % ophthalmic solution 1 drop 2 (two) times daily.    Marland Kitchen levonorgestrel (MIRENA) 20 MCG/24HR IUD 1 each by Intrauterine route once.    .  lidocaine-prilocaine (EMLA) cream Apply 1 application topically as needed. 30 g 2  . LORazepam (ATIVAN) 0.5 MG tablet Take 1 tablet (0.5 mg total) by mouth every 8 (eight) hours as needed for anxiety. 30 tablet 0  . ondansetron (ZOFRAN) 8 MG tablet Take 1 tablet (8 mg total) by mouth 2 (two) times daily as needed for refractory nausea / vomiting. Start on day 3 after chemo. 30 tablet 1  . predniSONE (DELTASONE) 20 MG tablet Take 3 tablets (60 mg total) by mouth daily. Take on days 1-5 of chemotherapy. 20 tablet 5  . prochlorperazine (COMPAZINE) 10 MG tablet Take 1 tablet (10 mg total) by mouth every 6 (six) hours as needed (Nausea or vomiting). 30 tablet 6  . senna (SENOKOT) 8.6 MG TABS tablet Take 1 tablet (8.6 mg total) by mouth 2 (two) times daily. 30 each 1   No current facility-administered medications for this visit.    Facility-Administered Medications Ordered in Other Visits  Medication Dose Route Frequency Provider Last Rate Last Dose  . sodium chloride flush (NS) 0.9 %  injection 10 mL  10 mL Intracatheter PRN Brunetta Genera, MD   10 mL at 03/26/17 1724  . sodium chloride flush (NS) 0.9 % injection 10 mL  10 mL Intravenous PRN Brunetta Genera, MD   10 mL at 04/16/17 1938    REVIEW OF SYSTEMS:    .10 Point review of Systems was done is negative except as noted above.   PHYSICAL EXAMINATION:  ECOG PERFORMANCE STATUS: 1 - Symptomatic but completely ambulatory  . Vitals:   08/14/17 1320  BP: 114/80  Pulse: (!) 101  Resp: 18  Temp: 98.7 F (37.1 C)  SpO2: 100%   Filed Weights   08/14/17 1320  Weight: 229 lb 11.2 oz (104.2 kg)   .Body mass index is 40.69 kg/m.  Marland Kitchen GENERAL:alert, in no acute distress and comfortable SKIN: no acute rashes, no significant lesions EYES: conjunctiva are pink and non-injected, sclera anicteric OROPHARYNX: MMM, no exudates, no oropharyngeal erythema or ulceration NECK: supple, no JVD LYMPH:  no palpable lymphadenopathy in the  cervical, axillary or inguinal regions LUNGS: clear to auscultation b/l with normal respiratory effort HEART: regular rate & rhythm ABDOMEN:  normoactive bowel sounds , non tender, not distended. Extremity: no pedal edema PSYCH: alert & oriented x 3 with fluent speech NEURO: no focal motor/sensory deficits  LABORATORY DATA:  I have reviewed the data as listed   . CBC Latest Ref Rng & Units 08/14/2017 07/09/2017 06/19/2017  WBC 3.9 - 10.3 K/uL 3.7(L) 3.4(L) 3.6(L)  Hemoglobin 11.6 - 15.9 g/dL - - 12.3  Hematocrit 34.8 - 46.6 % 40.8 36.5 37.6  Platelets 145 - 400 K/uL 211 234 257   ANC 2300  . CMP Latest Ref Rng & Units 08/14/2017 07/09/2017 06/19/2017  Glucose 70 - 140 mg/dL 97 106 90  BUN 7 - 26 mg/dL 11 11 10.7  Creatinine 0.60 - 1.10 mg/dL 1.23(H) 0.85 0.9  Sodium 136 - 145 mmol/L 143 140 141  Potassium 3.5 - 5.1 mmol/L 4.6 3.7 3.9  Chloride 98 - 109 mmol/L 106 107 -  CO2 22 - 29 mmol/L 27 24 25   Calcium 8.4 - 10.4 mg/dL 10.0 9.3 9.5  Total Protein 6.4 - 8.3 g/dL 7.2 6.8 6.9  Total Bilirubin 0.2 - 1.2 mg/dL 0.3 0.3 0.32  Alkaline Phos 40 - 150 U/L 43 39(L) 35(L)  AST 5 - 34 U/L 21 17 17   ALT 0 - 55 U/L 15 13 15    Component     Latest Ref Rng & Units 01/14/2017  LDH     125 - 245 U/L 193  HIV Screen 4th Generation wRfx     Non Reactive Non Reactive  Hep C Virus Ab     0.0 - 0.9 s/co ratio <0.1  hCG,Beta Subunit,Qual,Serum     Negative <6 mIU/mL Negative  Sed Rate     0 - 32 mm/hr 7   Lymphnode biopsy, 02/11/2017 Diagnosis Lymph node for lymphoma, Mesenteric Lymphadenopathy - HODGKIN LYMPHOMA.         PROCEDURES  ECHO 03/13/17 Study Conclusions - Left ventricle: The cavity size was normal. Systolic function was   normal. The estimated ejection fraction was in the range of 55%   to 60%. Wall motion was normal; there were no regional wall   motion abnormalities. Left ventricular diastolic function   parameters were normal. - Atrial septum: No defect or  patent foramen ovale was identified. - Impressions: Normal GLS -20.2.   RADIOGRAPHIC STUDIES: I have personally reviewed the radiological  images as listed and agreed with the findings in the report. Nm Pet Image Restag (ps) Skull Base To Thigh  Result Date: 08/06/2017 CLINICAL DATA:  Subsequent treatment strategy for classic Hodgkin's lymphoma. Stage 3/4. Patient status post induction chemotherapy. EXAM: NUCLEAR MEDICINE PET SKULL BASE TO THIGH TECHNIQUE: 11.4 mCi F-18 FDG was injected intravenously. Full-ring PET imaging was performed from the skull base to thigh after the radiotracer. CT data was obtained and used for attenuation correction and anatomic localization. FASTING BLOOD GLUCOSE:  Value: 103 mg/dl COMPARISON:  None. FINDINGS: NECK No hypermetabolic lymph nodes in the neck. CHEST No hypermetabolic mediastinal or hilar nodes. No residual activity in the mediastinal lymph nodes (Deauville 1). No suspicious pulmonary nodules on the CT scan. ABDOMEN/PELVIS No significant residual activity within periaortic lymph nodes activity. For example small LEFT periaortic lymph node SUV max 1.8 which is less than mediastinal blood pool. Example central mesenteric lymph node measures 1.5 cm short axis (118, series 4 SUV max) No abnormal activity the spleen is. Abnormal activity pelvic lymph nodes. Intensely hypermetabolic lesion in the anterior uterine body most consistent IUD expected location SKELETON No focal hypermetabolic activity to suggest skeletal metastasis. IMPRESSION: 1. No evidence active lymphoma with minimal activity within the remaining periaortic and mesenteric lymph nodes - Deauville 2). 2. Hypermetabolic leiomyoma within the uterus. Electronically Signed   By: Suzy Bouchard M.D.   On: 08/06/2017 10:15   NM PET Image Initial (PI) Skull Base To Thigh (Accession [id number]) (Order [id number])  Imaging  Date: 02/01/2017 Department: [facility]NU[facility] Released By:  Hilda Lias Authorizing: Brunetta Genera, MD  Exam Information   Status Exam Begun  Exam Ended   Final [99] 02/01/2017 1:34 PM 02/01/2017 3:02 PM  PACS Images   Show images for NM PET Image Initial (PI) Skull Base To Thigh  Study Result   CLINICAL DATA:  Initial treatment strategy for abdominal and thoracic lymphadenopathy on recent CT scans.  EXAM: NUCLEAR MEDICINE PET SKULL BASE TO THIGH  TECHNIQUE: 10.8 mCi F-18 FDG was injected intravenously. Full-ring PET imaging was performed from the skull base to thigh after the radiotracer. CT data was obtained and used for attenuation correction and anatomic localization.  FASTING BLOOD GLUCOSE:  Value: 91 mg/dl  COMPARISON:  CT scans dated 01/09/2017  FINDINGS: NECK  No hypermetabolic lymph nodes in the neck.  CHEST  Right paratracheal, subcarinal, paraesophageal, and para aortic/pleural soft tissue density nodules are identified in the chest, most of which demonstrate relatively low-grade in activity. A hypermetabolic periaortic lymph node on image 79/4 has a maximum SUV of 4.7 which is fairly representative of the other lymph nodes in the chest.  A smooth oval-shaped right breast lesion measuring 4.0 by 1.8 cm has a maximum SUV of 3.4.  The lungs appear clear.  ABDOMEN/PELVIS  Extensive bulky retroperitoneal, mesenteric, porta hepatis, retrocrural, and peripancreatic adenopathy observed. A mesenteric lymph node on image 130/4 measures 3.5 cm in short axis and has a maximum standard uptake value of 12.1. A lymph node above the pancreatic body measures 3.6 cm in short axis on image 102/4 and has a maximum SUV of 13.2. New  No splenomegaly. No focal splenic or hepatic lesion. The extensive peripancreatic nodal disease makes it difficult to completely exclude a pancreatic lesion although I am skeptical of a pancreatic parenchymal lesion.  Non rotated right kidney. 5.4 by 4.7 cm right right  for uterine adnexal cystic lesion appears photopenic. Fairly empty urinary bladder with flattened  appearance causing several adjacent collections of urine containing FDG ; this simulates an anterior uterine lesion but there is no CT correlate on either today's exam or the 01/09/2017 exam to suggest that there is a true uterine lesion.  SKELETON  Low-grade diffuse mildly accentuated activity throughout the skeleton but without a focal skeletal lesion identified.  IMPRESSION: 1. Hypermetabolic retroperitoneal, mesenteric, peripancreatic, porta hepatis, and retrocrural adenopathy. 2. Nodularity in the left paraspinal, subcarinal, and paraesophageal regions in the chest as lower but still abnormal metabolic activity, and probably represents adenopathy rather than extramedullary hematopoiesis. There is also a faintly metabolic lesion in the right breast which probably represents the same lesion biopsied in 2016, with benign results. 3. Photopenic cystic right adnexal lesion. An IUD is present in the uterus.   Electronically Signed   By: Van Clines M.D.   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 -PET/CT showed fairly active disease. Abdominal disease if progression occurs could potentially cause organ injury. -LDH level and sedimentation rate within normal limits . -HIV negative  -Hepatitis C negative.  -Baseline ECHO from 03/13/17 shows normal heart function, EF 55%-60%  Rpt PET/CT on 05/29/2017 - showed Significant reduction in size and metabolic activity of the formerly extensive adenopathy in the chest and abdomen, with primarily Deauville 3  activity today, and some Deauville 2 activity. Previously this was Deauville 5.  S/p R-CHOP x 6 cycles  PET/CT 08/06/2017: No evidence active lymphoma with minimal activity within the remaining periaortic and mesenteric lymph nodes - Deauville 2  2) Neutropenia - due to non delivery of neulasta on pro (Device mechanically came off prior to delivery of medication) - now resolved.  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 -results and pet images reivewed with patient. -d/w patient - no strong indication for IFRT. Patient prefers to hold off on this as well. -discussed NCCN surveillance and f/u guidelines. -If no further treatment needed at this time. -will ask Dr Barry Dienes if she could help remove port vs having IR remove port. -yearly flu shot -prevnar and pneumovax 4) Patient Active Problem List   Diagnosis Date Noted  . Counseling regarding advanced care planning and goals of care 06/24/2017  . Nodular lymphocyte predominant Hodgkin lymphoma of lymph nodes of multiple regions ([place]) 03/19/2017  . Mesenteric lymphadenopathy 02/11/2017  . Adenopathy 01/10/2017  . 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 Abnormal chest CT 01/07/2017  . History of panic attacks   . Bacterial pharyngitis 07/30/2016  -continue f/u with PCP for other chronic medical issues  5) anxiety -ativan prn -given referral to see A psychologist for counseling.   6) Grade 1 neuropathy -Likely due to Vincristine with her infusion nearly resolved on labs today. -continue taking Vitamin B complex with meals to assist with this.     - Port a cath removal by IR - referral to Behavioral health/psychologist for anxiety -RTC with Dr [name] in 3 months with labs  All of the patients questions were answered with apparent satisfaction.  The patient knows to call the clinic with any problems,  questions or concerns.  . The total time spent in the appointment was 25 minutes and more than 50% was on counseling and direct patient cares.   Sullivan Lone MD [place] AAHIVMS [facility] Hematology/Oncology Physician [facility]  (Office):       [phone] (Work cell):  [phone] (Fax):           [phone]  This document serves as a record of services personally performed by Sullivan Lone, MD. It was created on his behalf by Baldwin Jamaica, a trained medical scribe. The creation of this record is based on the scribe's personal observations and the provider's statements to them.   .I have reviewed the above documentation for accuracy and completeness, and I agree with the above. Brunetta Genera MD MS

## 2017-08-14 ENCOUNTER — Encounter: Payer: Self-pay | Admitting: Hematology

## 2017-08-14 ENCOUNTER — Inpatient Hospital Stay: Payer: 59

## 2017-08-14 ENCOUNTER — Inpatient Hospital Stay: Payer: 59 | Attending: Hematology | Admitting: Hematology

## 2017-08-14 ENCOUNTER — Telehealth: Payer: Self-pay

## 2017-08-14 VITALS — BP 114/80 | HR 101 | Temp 98.7°F | Resp 18 | Ht 63.0 in | Wt 229.7 lb

## 2017-08-14 DIAGNOSIS — Z9221 Personal history of antineoplastic chemotherapy: Secondary | ICD-10-CM | POA: Insufficient documentation

## 2017-08-14 DIAGNOSIS — C8108 Nodular lymphocyte predominant Hodgkin lymphoma, lymph nodes of multiple sites: Secondary | ICD-10-CM

## 2017-08-14 DIAGNOSIS — C8190 Hodgkin lymphoma, unspecified, unspecified site: Secondary | ICD-10-CM | POA: Insufficient documentation

## 2017-08-14 DIAGNOSIS — I10 Essential (primary) hypertension: Secondary | ICD-10-CM | POA: Diagnosis not present

## 2017-08-14 DIAGNOSIS — F419 Anxiety disorder, unspecified: Secondary | ICD-10-CM | POA: Diagnosis not present

## 2017-08-14 LAB — CMP (CANCER CENTER ONLY)
ALT: 15 U/L (ref 0–55)
AST: 21 U/L (ref 5–34)
Albumin: 3.9 g/dL (ref 3.5–5.0)
Alkaline Phosphatase: 43 U/L (ref 40–150)
Anion gap: 10 (ref 3–11)
BUN: 11 mg/dL (ref 7–26)
CHLORIDE: 106 mmol/L (ref 98–109)
CO2: 27 mmol/L (ref 22–29)
CREATININE: 1.23 mg/dL — AB (ref 0.60–1.10)
Calcium: 10 mg/dL (ref 8.4–10.4)
GFR, Estimated: 55 mL/min — ABNORMAL LOW (ref >60)
Glucose, Bld: 97 mg/dL (ref 70–140)
Potassium: 4.6 mmol/L (ref 3.5–5.1)
Sodium: 143 mmol/L (ref 136–145)
Total Bilirubin: 0.3 mg/dL (ref 0.2–1.2)
Total Protein: 7.2 g/dL (ref 6.4–8.3)

## 2017-08-14 LAB — CBC WITH DIFFERENTIAL (CANCER CENTER ONLY)
Basophils Absolute: 0 10*3/uL (ref 0.0–0.1)
Basophils Relative: 0 %
EOS ABS: 0.1 10*3/uL (ref 0.0–0.5)
Eosinophils Relative: 3 %
HCT: 40.8 % (ref 34.8–46.6)
HEMOGLOBIN: 13.4 g/dL (ref 11.6–15.9)
LYMPHS ABS: 0.7 10*3/uL — AB (ref 0.9–3.3)
Lymphocytes Relative: 17 %
MCH: 27.7 pg (ref 25.1–34.0)
MCHC: 32.8 g/dL (ref 31.5–36.0)
MCV: 84.3 fL (ref 79.5–101.0)
MONOS PCT: 17 %
Monocytes Absolute: 0.6 10*3/uL (ref 0.1–0.9)
NEUTROS PCT: 63 %
Neutro Abs: 2.3 10*3/uL (ref 1.5–6.5)
Platelet Count: 211 10*3/uL (ref 145–400)
RBC: 4.84 MIL/uL (ref 3.70–5.45)
RDW: 12.7 % (ref 11.2–14.5)
WBC Count: 3.7 10*3/uL — ABNORMAL LOW (ref 3.9–10.3)

## 2017-08-14 LAB — RETICULOCYTES
RBC.: 4.84 MIL/uL (ref 3.70–5.45)
RETIC CT PCT: 1.4 % (ref 0.7–2.1)
Retic Count, Absolute: 67.8 10*3/uL (ref 33.7–90.7)

## 2017-08-14 LAB — SEDIMENTATION RATE: SED RATE: 12 mm/h (ref 0–22)

## 2017-08-14 NOTE — Telephone Encounter (Signed)
Central will contact patient for IR

## 2017-08-14 NOTE — Patient Instructions (Signed)
Thank you for choosing [facility] to provide your oncology and hematology care.  To afford each patient quality time with our providers, please arrive 30 minutes before your scheduled appointment time.  If you arrive late for your appointment, you may be asked to reschedule.  We strive to give you quality time with our providers, and arriving late affects you and other patients whose appointments are after yours.   If you are a no show for multiple scheduled visits, you may be dismissed from the clinic at the providers discretion.    Again, thank you for choosing [facility], our hope is that these requests will decrease the amount of time that you wait before being seen by our physicians.  ______________________________________________________________________  Should you have questions after your visit to the [facility], please contact our office at [phone] between the hours of 8:30 and 4:30 p.m.    Voicemails left after 4:30p.m will not be returned until the following business day.    For prescription refill requests, please have your pharmacy contact us directly.  Please also try to allow 48 hours for prescription requests.    Please contact the scheduling department for questions regarding scheduling.  For scheduling of procedures such as PET scans, CT scans, MRI, Ultrasound, etc please contact central scheduling at [phone].    Resources For Cancer Patients and Caregivers:   Oncolink.org:  A wonderful resource for patients and healthcare providers for information regarding your disease, ways to tract your treatment, what to expect, etc.     American Cancer Society:  800-227-2345  Can help patients locate various types of support and financial assistance  Cancer Care: 1-800-813-HOPE (4673) Provides financial assistance, online support groups, medication/co-pay assistance.    [place] DSS:  336-641-3447 Where to apply for food  stamps, Medicaid, and utility assistance  Medicare Rights Center: 800-333-4114 Helps people with Medicare understand their rights and benefits, navigate the Medicare system, and secure the quality healthcare they deserve  SCAT: 336-333-6589 [place] [employer]'s shared-ride transportation service for eligible riders who have a disability that prevents them from riding the fixed route bus.    For additional information on assistance programs please contact our social worker:   Grier Hock/Abigail [place]:  [phone]            

## 2017-08-15 ENCOUNTER — Telehealth: Payer: Self-pay

## 2017-08-15 NOTE — Telephone Encounter (Signed)
Spoke with patient concerning talking with [facility], she said that someone have already called her and set up a counseling session. Per 2/20 los

## 2017-08-20 ENCOUNTER — Other Ambulatory Visit: Payer: Self-pay | Admitting: General Surgery

## 2017-08-21 ENCOUNTER — Telehealth: Payer: Self-pay

## 2017-08-21 ENCOUNTER — Other Ambulatory Visit: Payer: Self-pay | Admitting: Hematology

## 2017-08-21 DIAGNOSIS — R530 Neoplastic (malignant) related fatigue: Secondary | ICD-10-CM

## 2017-08-21 NOTE — Telephone Encounter (Signed)
Pt called this morning regarding port removal and physical therapy referral. Spoke with Dr. [name] and plan is for Dr. Barry Dienes to remove port. Confirmed referral placed with [facility] for management of fatigue, endurance, and strength training. Pt to expect a phone call.   Returned call to pt with this information. Pt already has appt scheduled with Dr. Barry Dienes and knows to expect a call from [facility].

## 2017-08-28 ENCOUNTER — Ambulatory Visit: Payer: 59 | Attending: Hematology | Admitting: Rehabilitation

## 2017-08-28 ENCOUNTER — Other Ambulatory Visit: Payer: Self-pay

## 2017-08-28 ENCOUNTER — Encounter: Payer: Self-pay | Admitting: Rehabilitation

## 2017-08-28 DIAGNOSIS — Z7409 Other reduced mobility: Secondary | ICD-10-CM | POA: Diagnosis present

## 2017-08-28 DIAGNOSIS — M79605 Pain in left leg: Secondary | ICD-10-CM | POA: Diagnosis present

## 2017-08-28 DIAGNOSIS — M6281 Muscle weakness (generalized): Secondary | ICD-10-CM | POA: Insufficient documentation

## 2017-08-28 DIAGNOSIS — R530 Neoplastic (malignant) related fatigue: Secondary | ICD-10-CM

## 2017-08-28 DIAGNOSIS — M549 Dorsalgia, unspecified: Secondary | ICD-10-CM | POA: Diagnosis present

## 2017-08-28 DIAGNOSIS — M79604 Pain in right leg: Secondary | ICD-10-CM | POA: Insufficient documentation

## 2017-08-28 NOTE — Therapy (Signed)
[place], [place], [address] Phone: [phone]   Fax:  [phone]  Physical Therapy Evaluation  Patient Details  Name: Bonnie Larsen MRN: [id number] Date of Birth: [birth year] Referring Provider: Brunetta Genera, MD   Encounter Date: 08/28/2017  PT End of Session - 08/28/17 1528    Visit Number  1    Number of Visits  8    Date for PT Re-Evaluation  09/25/17    Authorization Type  [employer]    PT Start Time  1300    PT Stop Time  1345    PT Time Calculation (min)  45 min    Activity Tolerance  Patient tolerated treatment well    Behavior During Therapy  [facility] for tasks assessed/performed       Past Medical History:  Diagnosis Date  . Anxiety   . Difficult intravenous access   . Family history of adverse reaction to anesthesia    pt's mother has hx. of post-op N/V and hard to wake up post-op  . History of panic attacks    02/08/17- has not had a panic attacks  . Lymphoma ([place]) 01/2017  . Mesenteric lymphadenopathy 01/2017  . Runny nose 02/19/2017   clear drainage, per pt.  . Seasonal allergies     Past Surgical History:  Procedure Laterality Date  . LYMPH NODE BIOPSY N/A 02/11/2017   Procedure: HAND ASSISTED LAPAROSCOPIC LYMPH NODE BIOPSY;  Surgeon: Stark Klein, MD;  Location: [place];  Service: General;  Laterality: N/A;  . PORTACATH PLACEMENT N/A 02/20/2017   Procedure: INSERTION PORT-A-CATH;  Surgeon: Stark Klein, MD;  Location: [place];  Service: General;  Laterality: N/A;  . TUBAL LIGATION    . UMBILICAL HERNIA REPAIR  02/11/2017    There were no vitals filed for this visit.   Subjective Assessment - 08/28/17 1346    Subjective  Pt presents after chemotherapy in January with complaints of muscle and joint pain in the arms, legs, and abdomen as well as fatigue.  The pain in the arms and hands feel more like joint pain and the legs feel more muscular.  Legs>arms.   Fatigue also present some good and bad days.  Work is very tiring and by the end of the day is exhausted.  Started working 2 weeks ago.  Works at home for [employer].  Some back pain.     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  walking through the grocery store will increase SOB and back pain    Patient Stated Goals  improve stamina, decrease pain    Currently in Pain?  Yes    Pain Score  5     Pain Location  Back thoracic region    Pain Orientation  Upper;Mid    Pain Descriptors / Indicators  Aching    Pain Type  Acute pain    Pain Radiating Towards  lower back and stomach occasionally    Pain Onset  More than a month ago    Pain Frequency  Intermittent    Aggravating Factors   with SOB.  Legs and Arms come and go with activity up to a 10/10 at the end of the day; usually gone when  waking up.      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   No    Is the patient reluctant to leave their home because of a fear of falling?   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[employer]    Leisure  not currently exercising,       Cognition   Overall Cognitive Status  Within Functional Limits for tasks assessed      Sensation   Additional Comments  some fingertip numbness       Coordination   Gross Motor Movements are Fluid and Coordinated  Yes      ROM / Strength   AROM / PROM / Strength  AROM;PROM;Strength      Strength   Strength Assessment Site  Shoulder;Hip;Knee    Right/Left Shoulder  Right;Left    Right/Left Hip  Right;Left    Right Hip Flexion  4-/5    Right Hip Extension  3+/5    Right Hip External Rotation   4/5    Right Hip Internal Rotation  4+/5    Right Hip ABduction  4-/5    Left Hip Flexion  4-/5    Left Hip Extension  3+/5    Left Hip External Rotation  4/5    Left Hip Internal Rotation  4+/5    Left Hip ABduction  4-/5    Right/Left Knee  Right;Left    Right Knee Flexion  4+/5    Right Knee Extension  4+/5    Left Knee Flexion  4+/5    Left Knee Extension  4+/5      Flexibility   Soft Tissue Assessment /Muscle Length  yes    Hamstrings  45deg bil    Piriformis  tightness bil      Transfers   Five time sit to stand comments   17.5 seconds without hands    Comments  30sec sit to stand: 12 without hands      6 Minute Walk- Baseline   6 Minute Walk- Baseline  yes      6 minute walk test results    Endurance additional comments  completed 23min at 752ft no SOB and rated difficulty of 0        LYMPHEDEMA/ONCOLOGY QUESTIONNAIRE - 08/28/17 1357      Type   Cancer Type  nodular lymphocyte predominant  Hodgkins lymphoma s/p 6 cycles of R-CHOP chemotherapy      Surgeries   Other Surgery Date  02/09/18 abdominal lymph node removal and hernia repair 8/18    Number Lymph Nodes Removed  6      Treatment   Active Chemotherapy Treatment  No    Past Chemotherapy  Treatment  Yes 6 rounds of R-CHOP ending in January    Active Radiation Treatment  No    Past Radiation Treatment  No    Current Hormone Treatment  No          Objective measurements completed on examination: See above findings.              PT Education - 08/28/17 1528    Education provided  Yes    Education Details  HEP explanation not performance, POC, walking 150 min/week    Person(s) Educated  Patient    Methods  Explanation;Handout    Comprehension  Verbalized understanding       PT Short Term Goals - 08/28/17 1535      PT SHORT TERM GOAL #1   Title  Pt will be independent with initial HEP    Baseline  no HEP    Time  1    Period  Weeks    Status  New    Target Date  09/04/17      PT SHORT TERM GOAL #2   Title  Pt will be educated on walking/activity recommendations to improve endurance    Time  1    Period  Weeks    Status  New    Target Date  09/04/17        PT Long Term Goals - 08/28/17 1536      PT LONG TERM GOAL #1   Title  Pt will improve sit to stand in 30 seconds to 16    Baseline  12    Time  4    Period  Weeks    Status  New    Target Date  09/25/17      PT LONG TERM GOAL #2   Title  pt will improve hip flexion and abduction MMT in supine to 4/5 or greater    Time  4    Period  Weeks    Status  New    Target Date  09/04/17      PT LONG TERM GOAL #3   Title  pt will be able to walk around the grocery store without increased SOB    Time  4    Period  Weeks    Status  New    Target Date  09/04/17      PT LONG TERM GOAL #4   Title  pt will report leg pain no greater than 5/10     Baseline  10/10 at worst    Time  4    Period  Weeks    Status  New    Target Date  09/04/17             Plan - 08/28/17 1529    Clinical Impression Statement  Pt presents wth complaints of decreased strength, decreased endurance, and pain in the back, legs, and arms after completing 6 rounds of R-CHOP chemotherapy for lymphoma.  She  reports that her LEs and fatigue are the biggest issues and the upper back and arms are secondary at this point.  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  She would like to improve her strength and endurance.      Clinical Presentation  Stable    Clinical Presentation due to:  remission and treatments complete    Clinical Decision Making  Low    Rehab Potential  Excellent    PT Frequency  2x / week    PT Duration  4 weeks    PT Treatment/Interventions  Therapeutic exercise;Gait training;Patient/family education    PT Next Visit Plan  review HEP, begin hamstring and lateral hip stretches, endurance and LE strength.  incorporate UE/core as possible    PT Home Exercise Plan  given handout 3/6: hamstring stretch, SLR, quad set, piriformis stretch, row with red band    Consulted and Agree with Plan of Care  Patient       Patient will benefit from skilled therapeutic intervention in order to improve the following deficits and impairments:  Decreased strength, Decreased endurance  Visit Diagnosis: Neoplastic (malignant) related fatigue - Plan: PT plan of care cert/re-cert  Muscle weakness (generalized) - Plan: PT plan of care cert/re-cert  Pain in both lower extremities - Plan: PT plan of care cert/re-cert  Mid-back pain, acute - Plan: PT plan of care cert/re-cert     Problem List Patient Active Problem List   Diagnosis Date Noted  . Counseling regarding advanced care planning and goals of care 06/24/2017  . 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 Mesenteric lymphadenopathy 02/11/2017  . Adenopathy 01/10/2017  . 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 Abnormal chest CT 01/07/2017  . History of panic attacks   . Bacterial pharyngitis 07/30/2016    Stark Bray, DPT, CLT 08/28/2017, 3:39 PM  [place], [place], [address] Phone: [phone]   Fax:  [phone]  Name: TYTIANA COLES MRN: [id number] Date of Birth: April 19, 1981

## 2017-09-03 ENCOUNTER — Encounter (HOSPITAL_COMMUNITY): Payer: Self-pay | Admitting: *Deleted

## 2017-09-03 ENCOUNTER — Other Ambulatory Visit: Payer: Self-pay

## 2017-09-03 ENCOUNTER — Encounter (HOSPITAL_COMMUNITY): Payer: Self-pay | Admitting: Psychiatry

## 2017-09-03 ENCOUNTER — Ambulatory Visit (INDEPENDENT_AMBULATORY_CARE_PROVIDER_SITE_OTHER): Payer: 59 | Admitting: Psychiatry

## 2017-09-03 VITALS — BP 128/74 | HR 75 | Ht 63.0 in | Wt 233.0 lb

## 2017-09-03 DIAGNOSIS — Z79899 Other long term (current) drug therapy: Secondary | ICD-10-CM

## 2017-09-03 DIAGNOSIS — Z9221 Personal history of antineoplastic chemotherapy: Secondary | ICD-10-CM

## 2017-09-03 DIAGNOSIS — F320 Major depressive disorder, single episode, mild: Secondary | ICD-10-CM | POA: Diagnosis not present

## 2017-09-03 DIAGNOSIS — G43909 Migraine, unspecified, not intractable, without status migrainosus: Secondary | ICD-10-CM | POA: Insufficient documentation

## 2017-09-03 DIAGNOSIS — F410 Panic disorder [episodic paroxysmal anxiety] without agoraphobia: Secondary | ICD-10-CM

## 2017-09-03 DIAGNOSIS — C8590 Non-Hodgkin lymphoma, unspecified, unspecified site: Secondary | ICD-10-CM

## 2017-09-03 MED ORDER — FLUOXETINE HCL 10 MG PO CAPS: 10.0000 mg | ORAL_CAPSULE | Freq: Every day | ORAL | 1 refills | Status: DC

## 2017-09-03 NOTE — Progress Notes (Signed)
Pt denies SOB, chest pain, and being under the care of a cardiologist. Pt denies having a stress test and cardiac cath. Pt denies having recent labs. Pt made aware to stop taking  Aspirin, vitamins, fish oil, Biotin and herbal medications. Do not take any NSAIDs ie: Ibuprofen, Advil, Naproxen (Alve), Motrin, BC and Goody Powder. Pt verbalized understanding of all pre-op instructions.

## 2017-09-03 NOTE — Progress Notes (Signed)
Psychiatric Initial Adult Assessment   Patient Identification: Bonnie Larsen MRN:  [id number] Date of Evaluation:  09/03/2017 Referral Source: Oncologist. Chief Complaint:  I am depressed and sad. Visit Diagnosis:    ICD-10-CM   1. Depression, major, single episode, mild (HCC) F32.0 FLUoxetine (PROZAC) 10 MG capsule    History of Present Illness: Patient is 37 year old African-American married employed female who is referred from oncologist for the management of anxiety and depression.  Patient has history of panic attacks and anxiety but recently her symptoms started to get worse.  She feels tired, exhausted, poor sleep, racing thoughts but decreased energy and motivation.  She admitted worried about her future and health.  Patient diagnosed with lymphoma in July 2018 and she recently finished chemotherapy in January.  She was prescribed Ativan for panic attacks and she took Ativan while in chemotherapy in January.  Patient noticed after the chemotherapy she is been very sad, depressed, fatigue, sleeping too much with decreased energy and feeling hopelessness.  However he denies any suicidal thoughts or homicidal thoughts.  She denies any paranoia, hallucination, self abusive behavior.  She denies any nightmares, PTSD or any OCD symptoms.  She never taken any antidepressant or see therapist.  Otherwise she is in good health.  She lives with her husband and 2 kids.  She works for [employer].  Patient recently resumed her work after the chemotherapy.  Patient admitted sometimes crying spells but denies any agitation or any aggressive behavior.  She admitted weight gain because of fatigue and lack of interest to do things.  Patient denies drinking alcohol or using any illegal substances.  Associated Signs/Symptoms: Depression Symptoms:  depressed mood, insomnia, fatigue, feelings of worthlessness/guilt, difficulty concentrating, hopelessness, anxiety, panic attacks, loss of  energy/fatigue, disturbed sleep, weight gain, (Hypo) Manic Symptoms:  Irritable Mood, Anxiety Symptoms:  Excessive Worry, Panic Symptoms, Social Anxiety, Psychotic Symptoms:  No psychotic symptoms. PTSD Symptoms: Negative  Past Psychiatric History:  Patient had panic attack after her second child born in 2006.  She is prescribed Ativan by oncologist for panic attacks.  Patient has no previous history of psychiatric inpatient treatment or any suicidal attempt.  She was briefly given Cymbalta 20 mg for a few days but she stopped after feeling dizzy.  Previous Psychotropic Medications: Yes   Substance Abuse History in the last 12 months:  No.  Consequences of Substance Abuse: Negative  Past Medical History:  Past Medical History:  Diagnosis Date  . Anxiety   . Difficult intravenous access   . Family history of adverse reaction to anesthesia    pt's mother has hx. of post-op N/V and hard to wake up post-op  . History of panic attacks    02/08/17- has not had a panic attacks  . Lymphoma ([facility]) 01/2017  . Mesenteric lymphadenopathy 01/2017  . Runny nose 02/19/2017   clear drainage, per pt.  . Seasonal allergies     Past Surgical History:  Procedure Laterality Date  . LYMPH NODE BIOPSY N/A 02/11/2017   Procedure: HAND ASSISTED LAPAROSCOPIC LYMPH NODE BIOPSY;  Surgeon: Stark Klein, MD;  Location: [facility];  Service: General;  Laterality: N/A;  . PORTACATH PLACEMENT N/A 02/20/2017   Procedure: INSERTION PORT-A-CATH;  Surgeon: Stark Klein, MD;  Location: [facility];  Service: General;  Laterality: N/A;  . TUBAL LIGATION    . UMBILICAL HERNIA REPAIR  02/11/2017    Family Psychiatric History: Patient denies any family history of psychiatric illness.    Family History:  Family History  Problem Relation Age of Onset  . Breast cancer Mother 53  . Fibromyalgia Mother   . Rheum arthritis Mother   . Neuropathy Mother   . Anesthesia problems Mother        post-op N/V;  hard to wake up post-op  . Congestive Heart Failure Father        died at age 9  . Cirrhosis Father   . Heart disease Maternal Grandmother   . Diabetes Maternal Grandmother   . Other Brother        Benign spinal tumor  . Cancer Paternal Grandfather        Leukemia    Social History:   Social History   Socioeconomic History  . Marital status: Married    Spouse name: Gwyndolyn Saxon  . Number of children: 2  . Years of education: None  . Highest education level: Associate degree: occupational, Hotel manager, or vocational program  Social Needs  . Financial resource strain: Not hard at all  . Food insecurity - worry: Never true  . Food insecurity - inability: Never true  . Transportation needs - medical: No  . Transportation needs - non-medical: No  Occupational History  . None  Tobacco Use  . Smoking status: Never Smoker  . Smokeless tobacco: Never Used  Substance and Sexual Activity  . Alcohol use: No    Alcohol/week: 0.0 oz  . Drug use: No  . Sexual activity: Yes    Partners: Male    Birth control/protection: IUD    Comment: tubal ligation  Other Topics Concern  . None  Social History Narrative  . None    Additional Social History:  Patient born and raised in [place].  Her father is deceased.  Her mother lives close by.  She is married and she has 67 and 50 year old.  Her husband is very supportive.  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  Allergies:   Allergies  Allergen Reactions  . Clindamycin/Lincomycin Swelling    FACIAL SWELLING  . Cymbalta [Duloxetine Hcl] Nausea Only    Causes pt to have nausea and stomach cramping.    Metabolic Disorder Labs: Recent Results (from the past 2160 hour(s))  CBC with Differential     Status: Abnormal   Collection Time: 06/19/17  9:12 AM  Result Value Ref Range   WBC 3.6 (L) 3.9 - 10.3 10e3/uL   NEUT# 2.1 1.5 - 6.5 10e3/uL   HGB 12.3 11.6 - 15.9 g/dL   HCT 37.6 34.8 - 46.6 %   Platelets 257 145 -  400 10e3/uL   MCV 83.6 79.5 - 101.0 fL   MCH 27.3 25.1 - 34.0 pg   MCHC 32.7 31.5 - 36.0 g/dL   RBC 4.50 3.70 - 5.45 10e6/uL   RDW 16.5 (H) 11.2 - 14.5 %   lymph# 0.8 (L) 0.9 - 3.3 10e3/uL   MONO# 0.7 0.1 - 0.9 10e3/uL   Eosinophils Absolute 0.0 0.0 - 0.5 10e3/uL   Basophils Absolute 0.0 0.0 - 0.1 10e3/uL   NEUT% 58.9 38.4 - 76.8 %   LYMPH% 21.1 14.0 - 49.7 %   MONO% 18.6 (H) 0.0 - 14.0 %   EOS% 1.1 0.0 - 7.0 %   BASO% 0.3 0.0 - 2.0 %  Comprehensive metabolic panel     Status: Abnormal   Collection Time: 06/19/17  9:12 AM  Result Value Ref Range   Sodium 141 136 - 145 mEq/L   Potassium 3.9 3.5 - 5.1 mEq/L  Chloride 107 98 - 109 mEq/L   CO2 25 22 - 29 mEq/L   Glucose 90 70 - 140 mg/dl    Comment: Glucose reference range is for nonfasting patients. Fasting glucose reference range is 70- 100.   BUN 10.7 7.0 - 26.0 mg/dL   Creatinine 0.9 0.6 - 1.1 mg/dL   Total Bilirubin 0.32 0.20 - 1.20 mg/dL   Alkaline Phosphatase 35 (L) 40 - 150 U/L   AST 17 5 - 34 U/L   ALT 15 0 - 55 U/L   Total Protein 6.9 6.4 - 8.3 g/dL   Albumin 3.8 3.5 - 5.0 g/dL   Calcium 9.5 8.4 - 10.4 mg/dL   Anion Gap 9 3 - 11 mEq/L   EGFR >60 >60 ml/min/1.73 m2    Comment: eGFR is calculated using the CKD-EPI Creatinine Equation (2009)  Comprehensive metabolic panel     Status: Abnormal   Collection Time: 07/09/17  8:21 AM  Result Value Ref Range   Sodium 140 136 - 145 mmol/L   Potassium 3.7 3.3 - 4.7 mmol/L   Chloride 107 98 - 109 mmol/L   CO2 24 22 - 29 mmol/L   Glucose, Bld 106 70 - 140 mg/dL   BUN 11 7 - 26 mg/dL   Creatinine, Ser 0.85 0.60 - 1.10 mg/dL   Calcium 9.3 8.4 - 10.4 mg/dL   Total Protein 6.8 6.4 - 8.3 g/dL   Albumin 3.8 3.5 - 5.0 g/dL   AST 17 5 - 34 U/L   ALT 13 0 - 55 U/L   Alkaline Phosphatase 39 (L) 40 - 150 U/L   Total Bilirubin 0.3 0.2 - 1.2 mg/dL   GFR calc non Af Amer >60 >60 mL/min   GFR calc Af Amer >60 >60 mL/min    Comment: (NOTE) The eGFR has been calculated using the CKD  EPI equation. This calculation has not been validated in all clinical situations. eGFR's persistently <60 mL/min signify possible Chronic Kidney Disease.    Anion gap 9 3 - 11    Comment: Performed at [facility], [address] [place], [place] [address]  CBC with Differential ([place] Only)     Status: Abnormal   Collection Time: 07/09/17  8:21 AM  Result Value Ref Range   WBC Count 3.4 (L) 3.9 - 10.3 K/uL   RBC 4.33 3.70 - 5.45 MIL/uL   Hemoglobin 12.0 11.6 - 15.9 g/dL   HCT 36.5 34.8 - 46.6 %   MCV 84.3 79.5 - 101.0 fL   MCH 27.7 25.1 - 34.0 pg   MCHC 32.9 31.5 - 36.0 g/dL   RDW 15.3 11.2 - 16.1 %   Platelet Count 234 145 - 400 K/uL   Neutrophils Relative % 79 %   Neutro Abs 2.7 1.5 - 6.5 K/uL   Lymphocytes Relative 12 %   Lymphs Abs 0.4 (L) 0.9 - 3.3 K/uL   Monocytes Relative 8 %   Monocytes Absolute 0.3 0.1 - 0.9 K/uL   Eosinophils Relative 1 %   Eosinophils Absolute 0.0 0.0 - 0.5 K/uL   Basophils Relative 0 %   Basophils Absolute 0.0 0.0 - 0.1 K/uL    Comment: Performed at [facility], [place] [address] [place], [place] [address]  Vitamin B12     Status: Abnormal   Collection Time: 07/09/17  8:23 AM  Result Value Ref Range   Vitamin B-12 1,012 (H) 180 - 914 pg/mL    Comment: (NOTE)  This assay is not validated for testing neonatal or myeloproliferative syndrome specimens for Vitamin B12 levels. Performed at [facility], [place] [address], [place], [place] [address]   Hepatitis B surface antibody     Status: None   Collection Time: 07/09/17  8:34 AM  Result Value Ref Range   Hep B S Ab Non Reactive     Comment: (NOTE)              Non Reactive: Inconsistent with immunity,                            less than 10 mIU/mL              Reactive:     Consistent with immunity,                            greater than 9.9 mIU/mL Performed At: [facility] [place], [place]  [id number] Rush Farmer MD [id number] Performed at [facility], [place] [address] [place], [place] [address]   Hepatitis B surface antigen     Status: None   Collection Time: 07/09/17  8:34 AM  Result Value Ref Range   Hepatitis B Surface Ag Negative Negative    Comment: (NOTE) Performed At: [facility] [place], [place] [id number] Rush Farmer MD [id number] Performed at [facility], [place] [address] [place], [place] [address]   Hepatitis B core antibody, total     Status: None   Collection Time: 07/09/17  8:34 AM  Result Value Ref Range   Hep B Core Total Ab Negative Negative    Comment: (NOTE) Performed At: [facility] [address] [place], [place] [id number] Rush Farmer MD [id number] Performed at [facility], [place] [address] [place], [place] [address]   Glucose, capillary     Status: Abnormal   Collection Time: 08/06/17  7:29 AM  Result Value Ref Range   Glucose-Capillary 103 (H) 65 - 99 mg/dL  Sedimentation rate     Status: None   Collection Time: 08/14/17 12:33 PM  Result Value Ref Range   Sed Rate 12 0 - 22 mm/hr    Comment: Performed at [facility], [place] [address], [place],  [address]  CMP ([place] only)     Status: Abnormal   Collection Time: 08/14/17 12:33 PM  Result Value Ref Range   Sodium 143 136 - 145 mmol/L   Potassium 4.6 3.5 - 5.1 mmol/L   Chloride 106 98 - 109 mmol/L   CO2 27 22 - 29 mmol/L   Glucose, Bld 97 70 - 140 mg/dL   BUN 11 7 - 26 mg/dL   Creatinine 1.23 (H) 0.60 - 1.10 mg/dL   Calcium 10.0 8.4 - 10.4 mg/dL   Total Protein 7.2 6.4 - 8.3 g/dL   Albumin 3.9 3.5 - 5.0 g/dL   AST 21 5 - 34 U/L   ALT 15 0 - 55 U/L   Alkaline Phosphatase 43 40 - 150 U/L   Total Bilirubin 0.3 0.2 - 1.2 mg/dL   GFR, Est Non Af Am 55 (L) >60 mL/min   GFR, Est AFR Am >60 >60 mL/min    Comment: (NOTE) The  eGFR has been calculated using the CKD EPI equation. This calculation has not been validated in all clinical situations.  eGFR's persistently <60 mL/min signify possible Chronic Kidney Disease.    Anion gap 10 3 - 11    Comment: Performed at [facility], [address] [place], [place] [address]  CBC with Differential ([place] Only)     Status: Abnormal   Collection Time: 08/14/17 12:33 PM  Result Value Ref Range   WBC Count 3.7 (L) 3.9 - 10.3 K/uL   RBC 4.84 3.70 - 5.45 MIL/uL   Hemoglobin 13.4 11.6 - 15.9 g/dL   HCT 40.8 34.8 - 46.6 %   MCV 84.3 79.5 - 101.0 fL   MCH 27.7 25.1 - 34.0 pg   MCHC 32.8 31.5 - 36.0 g/dL   RDW 12.7 11.2 - 14.5 %   Platelet Count 211 145 - 400 K/uL   Neutrophils Relative % 63 %   Neutro Abs 2.3 1.5 - 6.5 K/uL   Lymphocytes Relative 17 %   Lymphs Abs 0.7 (L) 0.9 - 3.3 K/uL   Monocytes Relative 17 %   Monocytes Absolute 0.6 0.1 - 0.9 K/uL   Eosinophils Relative 3 %   Eosinophils Absolute 0.1 0.0 - 0.5 K/uL   Basophils Relative 0 %   Basophils Absolute 0.0 0.0 - 0.1 K/uL    Comment: Performed at [facility], [place] [address] [place], [place] [address]  Reticulocytes     Status: None   Collection Time: 08/14/17 12:33 PM  Result Value Ref Range   Retic Ct Pct 1.4 0.7 - 2.1 %   RBC. 4.84 3.70 - 5.45 MIL/uL   Retic Count, Absolute 67.8 33.7 - 90.7 K/uL    Comment: Performed at [facility], [place] [address], [place], [place] [address]   No results found for: HGBA1C, MPG No results found for: PROLACTIN No results found for: CHOL, TRIG, HDL, CHOLHDL, VLDL, LDLCALC   Current Medications: Current Outpatient Medications  Medication Sig Dispense Refill  . Biotin 10 MG TABS Take by mouth.    . levonorgestrel (MIRENA) 20 MCG/24HR IUD 1 each by Intrauterine route once.    . Multiple Vitamin (MULTIVITAMIN) tablet Take 1 tablet by mouth daily.    Marland Kitchen FLUoxetine (PROZAC) 10 MG  capsule Take 1 capsule (10 mg total) by mouth daily. 30 capsule 1  . LORazepam (ATIVAN) 0.5 MG tablet Take 1 tablet (0.5 mg total) by mouth every 8 (eight) hours as needed for anxiety. (Patient not taking: Reported on 09/03/2017) 30 tablet 0   No current facility-administered medications for this visit.    Facility-Administered Medications Ordered in Other Visits  Medication Dose Route Frequency Provider Last Rate Last Dose  . sodium chloride flush (NS) 0.9 % injection 10 mL  10 mL Intracatheter PRN Brunetta Genera, MD   10 mL at 03/26/17 1724  . sodium chloride flush (NS) 0.9 % injection 10 mL  10 mL Intravenous PRN Brunetta Genera, MD   10 mL at 04/16/17 1938    Neurologic: Headache: Yes Seizure: No Paresthesias:No  Musculoskeletal: Strength & Muscle Tone: within normal limits Gait & Station: normal Patient leans: N/A  Psychiatric Specialty Exam: Review of Systems  Constitutional: Negative for weight loss.  Neurological: Positive for headaches.  Psychiatric/Behavioral: The patient is nervous/anxious and has insomnia.     Blood pressure 128/74, pulse 75, height '5\' 3"'  (1.6 m), weight 233 lb (105.7 kg).Body mass index is 41.27 kg/m.  General Appearance: Casual and Obese  Eye Contact:  Fair  Speech:  Slow  Volume:  Decreased  Mood:  Anxious  Affect:  Constricted  Thought Process:  Goal Directed  Orientation:  Full (Time, Place, and Person)  Thought Content:  Logical  Suicidal Thoughts:  No  Homicidal Thoughts:  No  Memory:  Immediate;   Good Recent;   Good Remote;   Good  Judgement:  Good  Insight:  Good  Psychomotor Activity:  Normal  Concentration:  Concentration: Fair and Attention Span: Fair  Recall:  Good  Fund of Knowledge:Good  Language: Good  Akathisia:  No  Handed:  Right  AIMS (if indicated):  0  Assets:  Communication Skills Desire for Improvement Housing Resilience Social Support  ADL's:  Intact  Cognition: WNL  Sleep: Fair.    Assessment: Major depressive disorder, recurrent mild.  Panic attacks.  Plan: I review her symptoms, history, current medication recent blood work results.  She is taking Ativan 0.5 mg prescribed by oncologist for panic attacks.  I recommended to try low-dose Prozac to help with anxiety and depression.  We will start 10 mg daily.  Discussed medication side effects including GI.  Continue Ativan for severe panic attacks.  I do believe patient can get benefit from CBT.  We will schedule appointment to see a therapist in this office.  Recommended to call us back if she has any question, concern if she feels worsening of the symptoms.  Discussed safety concerns at any time having active suicidal thoughts or homicidal thought that he need to call 911 or go to local emergency room.  Follow-up in 3-4 weeks. Kathlee Nations, MD 3/12/201911:29 AM

## 2017-09-04 ENCOUNTER — Encounter (HOSPITAL_COMMUNITY): Payer: Self-pay | Admitting: *Deleted

## 2017-09-04 ENCOUNTER — Ambulatory Visit (HOSPITAL_COMMUNITY): Payer: 59 | Admitting: Anesthesiology

## 2017-09-04 ENCOUNTER — Ambulatory Visit (HOSPITAL_COMMUNITY)
Admission: RE | Admit: 2017-09-04 | Discharge: 2017-09-04 | Disposition: A | Discharge status: Home / Self Care | Payer: 59 | Source: Ambulatory Visit | Attending: General Surgery | Admitting: General Surgery

## 2017-09-04 ENCOUNTER — Encounter (HOSPITAL_COMMUNITY)
Admission: RE | Disposition: A | Discharge status: Home / Self Care | Payer: Self-pay | Source: Ambulatory Visit | Attending: General Surgery

## 2017-09-04 DIAGNOSIS — Z9221 Personal history of antineoplastic chemotherapy: Secondary | ICD-10-CM | POA: Insufficient documentation

## 2017-09-04 DIAGNOSIS — Z79899 Other long term (current) drug therapy: Secondary | ICD-10-CM | POA: Diagnosis not present

## 2017-09-04 DIAGNOSIS — Z8261 Family history of arthritis: Secondary | ICD-10-CM | POA: Diagnosis not present

## 2017-09-04 DIAGNOSIS — C8190 Hodgkin lymphoma, unspecified, unspecified site: Secondary | ICD-10-CM | POA: Insufficient documentation

## 2017-09-04 DIAGNOSIS — Z888 Allergy status to other drugs, medicaments and biological substances status: Secondary | ICD-10-CM | POA: Insufficient documentation

## 2017-09-04 DIAGNOSIS — Z820 Family history of epilepsy and other diseases of the nervous system: Secondary | ICD-10-CM | POA: Diagnosis not present

## 2017-09-04 DIAGNOSIS — F419 Anxiety disorder, unspecified: Secondary | ICD-10-CM | POA: Diagnosis not present

## 2017-09-04 DIAGNOSIS — Z881 Allergy status to other antibiotic agents status: Secondary | ICD-10-CM | POA: Insufficient documentation

## 2017-09-04 DIAGNOSIS — Z806 Family history of leukemia: Secondary | ICD-10-CM | POA: Insufficient documentation

## 2017-09-04 DIAGNOSIS — Z6841 Body Mass Index (BMI) 40.0 and over, adult: Secondary | ICD-10-CM | POA: Insufficient documentation

## 2017-09-04 DIAGNOSIS — Z833 Family history of diabetes mellitus: Secondary | ICD-10-CM | POA: Diagnosis not present

## 2017-09-04 DIAGNOSIS — Z803 Family history of malignant neoplasm of breast: Secondary | ICD-10-CM | POA: Insufficient documentation

## 2017-09-04 HISTORY — PX: PORT-A-CATH REMOVAL: SHX5289

## 2017-09-04 HISTORY — DX: Body Mass Index (BMI) 40.0 and over, adult: Z684

## 2017-09-04 HISTORY — DX: Morbid (severe) obesity due to excess calories: E66.01

## 2017-09-04 LAB — CBC
HEMATOCRIT: 43.4 % (ref 36.0–46.0)
HEMOGLOBIN: 14.1 g/dL (ref 12.0–15.0)
MCH: 26.6 pg (ref 26.0–34.0)
MCHC: 32.5 g/dL (ref 30.0–36.0)
MCV: 81.9 fL (ref 78.0–100.0)
Platelets: 218 10*3/uL (ref 150–400)
RBC: 5.3 MIL/uL — AB (ref 3.87–5.11)
RDW: 12.7 % (ref 11.5–15.5)
WBC: 3.6 10*3/uL — ABNORMAL LOW (ref 4.0–10.5)

## 2017-09-04 LAB — BASIC METABOLIC PANEL
ANION GAP: 11 (ref 5–15)
BUN: 10 mg/dL (ref 6–20)
CALCIUM: 9.5 mg/dL (ref 8.9–10.3)
CO2: 22 mmol/L (ref 22–32)
Chloride: 106 mmol/L (ref 101–111)
Creatinine, Ser: 0.88 mg/dL (ref 0.44–1.00)
GFR calc Af Amer: >60 mL/min (ref >60)
Glucose, Bld: 93 mg/dL (ref 65–99)
POTASSIUM: 4.5 mmol/L (ref 3.5–5.1)
SODIUM: 139 mmol/L (ref 135–145)

## 2017-09-04 LAB — POCT PREGNANCY, URINE: Preg Test, Ur: NEGATIVE

## 2017-09-04 SURGERY — REMOVAL PORT-A-CATH
Anesthesia: Monitor Anesthesia Care

## 2017-09-04 MED ORDER — CEFAZOLIN SODIUM-DEXTROSE 2-4 GM/100ML-% IV SOLN
2.0000 g | INTRAVENOUS | Status: AC
  Administered 2017-09-04: 2 g via INTRAVENOUS
  Filled 2017-09-04: qty 100

## 2017-09-04 MED ORDER — SODIUM CHLORIDE 0.9% FLUSH: 3.0000 mL | Freq: Two times a day (BID) | INTRAVENOUS | Status: DC

## 2017-09-04 MED ORDER — FENTANYL CITRATE (PF) 100 MCG/2ML IJ SOLN
INTRAMUSCULAR | Status: DC | PRN
  Administered 2017-09-04: 50 ug via INTRAVENOUS
  Administered 2017-09-04 (×2): 25 ug via INTRAVENOUS

## 2017-09-04 MED ORDER — CELECOXIB 200 MG PO CAPS
200.0000 mg | ORAL_CAPSULE | ORAL | Status: AC
  Administered 2017-09-04: 200 mg via ORAL
  Filled 2017-09-04: qty 1

## 2017-09-04 MED ORDER — BUPIVACAINE-EPINEPHRINE 0.25% -1:200000 IJ SOLN
INTRAMUSCULAR | Status: AC
  Filled 2017-09-04: qty 1

## 2017-09-04 MED ORDER — LIDOCAINE HCL 1 % IJ SOLN
INTRAMUSCULAR | Status: AC
  Filled 2017-09-04: qty 20

## 2017-09-04 MED ORDER — OXYCODONE HCL 5 MG PO TABS: 5.0000 mg | ORAL_TABLET | Freq: Four times a day (QID) | ORAL | 0 refills | Status: DC | PRN

## 2017-09-04 MED ORDER — PROMETHAZINE HCL 25 MG/ML IJ SOLN: 6.2500 mg | INTRAMUSCULAR | Status: DC | PRN

## 2017-09-04 MED ORDER — LACTATED RINGERS IV SOLN
INTRAVENOUS | Status: DC
  Administered 2017-09-04: 09:00:00 via INTRAVENOUS

## 2017-09-04 MED ORDER — PROPOFOL 1000 MG/100ML IV EMUL
INTRAVENOUS | Status: AC
  Filled 2017-09-04: qty 100

## 2017-09-04 MED ORDER — ONDANSETRON HCL 4 MG/2ML IJ SOLN
INTRAMUSCULAR | Status: DC | PRN
  Administered 2017-09-04: 4 mg via INTRAVENOUS

## 2017-09-04 MED ORDER — ONDANSETRON HCL 4 MG/2ML IJ SOLN
INTRAMUSCULAR | Status: AC
  Filled 2017-09-04: qty 2

## 2017-09-04 MED ORDER — 0.9 % SODIUM CHLORIDE (POUR BTL) OPTIME
TOPICAL | Status: DC | PRN
  Administered 2017-09-04: 1000 mL

## 2017-09-04 MED ORDER — OXYCODONE HCL 5 MG PO TABS: 5.0000 mg | ORAL_TABLET | ORAL | Status: DC | PRN

## 2017-09-04 MED ORDER — SODIUM CHLORIDE 0.9% FLUSH: 3.0000 mL | INTRAVENOUS | Status: DC | PRN

## 2017-09-04 MED ORDER — ACETAMINOPHEN 650 MG RE SUPP: 650.0000 mg | RECTAL | Status: DC | PRN

## 2017-09-04 MED ORDER — ACETAMINOPHEN 325 MG PO TABS: 650.0000 mg | ORAL_TABLET | ORAL | Status: DC | PRN

## 2017-09-04 MED ORDER — DIPHENHYDRAMINE HCL 50 MG/ML IJ SOLN
INTRAMUSCULAR | Status: DC | PRN
  Administered 2017-09-04: 12.5 mg via INTRAVENOUS

## 2017-09-04 MED ORDER — CHLORHEXIDINE GLUCONATE CLOTH 2 % EX PADS: 6.0000 | MEDICATED_PAD | Freq: Once | CUTANEOUS | Status: DC

## 2017-09-04 MED ORDER — GABAPENTIN 300 MG PO CAPS
300.0000 mg | ORAL_CAPSULE | ORAL | Status: AC
  Administered 2017-09-04: 300 mg via ORAL
  Filled 2017-09-04: qty 1

## 2017-09-04 MED ORDER — SODIUM CHLORIDE 0.9 % IV SOLN: 250.0000 mL | INTRAVENOUS | Status: DC | PRN

## 2017-09-04 MED ORDER — MIDAZOLAM HCL 2 MG/2ML IJ SOLN
INTRAMUSCULAR | Status: AC
  Filled 2017-09-04: qty 2

## 2017-09-04 MED ORDER — DIPHENHYDRAMINE HCL 50 MG/ML IJ SOLN
INTRAMUSCULAR | Status: AC
  Filled 2017-09-04: qty 1

## 2017-09-04 MED ORDER — FENTANYL CITRATE (PF) 250 MCG/5ML IJ SOLN
INTRAMUSCULAR | Status: AC
  Filled 2017-09-04: qty 5

## 2017-09-04 MED ORDER — MIDAZOLAM HCL 5 MG/5ML IJ SOLN
INTRAMUSCULAR | Status: DC | PRN
  Administered 2017-09-04: 2 mg via INTRAVENOUS

## 2017-09-04 MED ORDER — LIDOCAINE HCL 1 % IJ SOLN
INTRAMUSCULAR | Status: DC | PRN
  Administered 2017-09-04: 10 mL

## 2017-09-04 MED ORDER — FENTANYL CITRATE (PF) 100 MCG/2ML IJ SOLN: 25.0000 ug | INTRAMUSCULAR | Status: DC | PRN

## 2017-09-04 MED ORDER — PROPOFOL 500 MG/50ML IV EMUL
INTRAVENOUS | Status: DC | PRN
  Administered 2017-09-04: 100 ug/kg/min via INTRAVENOUS

## 2017-09-04 MED ORDER — ACETAMINOPHEN 500 MG PO TABS
1000.0000 mg | ORAL_TABLET | ORAL | Status: AC
  Administered 2017-09-04: 1000 mg via ORAL
  Filled 2017-09-04: qty 2

## 2017-09-04 SURGICAL SUPPLY — 31 items
BLADE SURG 15 STRL LF DISP TIS (BLADE) ×1 IMPLANT
BLADE SURG 15 STRL SS (BLADE) ×2
CHLORAPREP W/TINT 10.5 ML (MISCELLANEOUS) ×3 IMPLANT
COVER SURGICAL LIGHT HANDLE (MISCELLANEOUS) ×3 IMPLANT
DECANTER SPIKE VIAL GLASS SM (MISCELLANEOUS) ×6 IMPLANT
DERMABOND ADVANCED (GAUZE/BANDAGES/DRESSINGS) ×2
DERMABOND ADVANCED .7 DNX12 (GAUZE/BANDAGES/DRESSINGS) ×1 IMPLANT
DRAPE LAPAROTOMY 100X72 PEDS (DRAPES) ×3 IMPLANT
DRAPE UTILITY XL STRL (DRAPES) ×6 IMPLANT
ELECT CAUTERY BLADE 6.4 (BLADE) ×3 IMPLANT
ELECT REM PT RETURN 9FT ADLT (ELECTROSURGICAL) ×3
ELECTRODE REM PT RTRN 9FT ADLT (ELECTROSURGICAL) ×1 IMPLANT
GAUZE SPONGE 4X4 16PLY XRAY LF (GAUZE/BANDAGES/DRESSINGS) ×3 IMPLANT
GLOVE BIO SURGEON STRL SZ 6 (GLOVE) ×3 IMPLANT
GLOVE INDICATOR 6.5 STRL GRN (GLOVE) ×3 IMPLANT
GOWN STRL REUS W/ TWL LRG LVL3 (GOWN DISPOSABLE) ×1 IMPLANT
GOWN STRL REUS W/TWL 2XL LVL3 (GOWN DISPOSABLE) ×3 IMPLANT
GOWN STRL REUS W/TWL LRG LVL3 (GOWN DISPOSABLE) ×2
KIT BASIN OR (CUSTOM PROCEDURE TRAY) ×3 IMPLANT
KIT ROOM TURNOVER OR (KITS) ×3 IMPLANT
NEEDLE HYPO 25GX1X1/2 BEV (NEEDLE) ×3 IMPLANT
NS IRRIG 1000ML POUR BTL (IV SOLUTION) ×3 IMPLANT
PACK SURGICAL SETUP 50X90 (CUSTOM PROCEDURE TRAY) ×3 IMPLANT
PAD ARMBOARD 7.5X6 YLW CONV (MISCELLANEOUS) ×6 IMPLANT
PENCIL BUTTON HOLSTER BLD 10FT (ELECTRODE) ×3 IMPLANT
SUT MON AB 4-0 PC3 18 (SUTURE) ×3 IMPLANT
SUT VIC AB 3-0 SH 27 (SUTURE) ×2
SUT VIC AB 3-0 SH 27X BRD (SUTURE) ×1 IMPLANT
SYR CONTROL 10ML LL (SYRINGE) ×3 IMPLANT
TOWEL OR 17X24 6PK STRL BLUE (TOWEL DISPOSABLE) ×3 IMPLANT
TOWEL OR 17X26 10 PK STRL BLUE (TOWEL DISPOSABLE) ×3 IMPLANT

## 2017-09-04 NOTE — Transfer of Care (Signed)
Immediate Anesthesia Transfer of Care Note  Patient: Bonnie Larsen  Procedure(s) Performed: REMOVAL PORT-A-CATH (N/A )  Patient Location: PACU  Anesthesia Type:MAC  Level of Consciousness: awake  Airway & Oxygen Therapy: Patient Spontanous Breathing and Patient connected to face mask oxygen  Post-op Assessment: Report given to RN and Post -op Vital signs reviewed and stable  Post vital signs: Reviewed and stable  Last Vitals:  Vitals:   09/04/17 0854  BP: 111/66  Pulse: 80  Resp: 18  Temp: 36.8 C  SpO2: 100%    Last Pain:  Vitals:   09/04/17 0854  TempSrc: Oral      Patients Stated Pain Goal: 1 (12/75/17 0017)  Complications: No apparent anesthesia complications

## 2017-09-04 NOTE — Op Note (Signed)
  PRE-OPERATIVE DIAGNOSIS:  un-needed Port-A-Cath for hodgkin's lymphoma  POST-OPERATIVE DIAGNOSIS:  Same   PROCEDURE:  Procedure(s):  REMOVAL PORT-A-CATH  SURGEON:  Surgeon(s):  Stark Klein, MD  ANESTHESIA:   MAC + local  EBL:   Minimal  SPECIMEN:  None  Complications : none known  Procedure:   Pt was  identified in the holding area and taken to the operating room where she was placed supine on the operating room table.  MAC anesthesia was induced.  The left upper chest was prepped and draped.  The prior incision was anesthetized with local anesthetic.  The incision was opened with a #15 blade.  The subcutaneous tissue was divided with the cautery.  The port was identified and the capsule opened.  The four 2-0 prolene sutures were removed.  The port was then removed and pressure held on the tract.  The catheter appeared intact without evidence of breakage, length was 25.5 cm.  The wound was inspected for hemostasis, which was achieved with cautery.  The wound was closed with 3-0 vicryl deep dermal interrupted sutures and 4-0 Monocryl running subcuticular suture.  The wound was cleaned, dried, and dressed with dermabond.  The patient was awakened from anesthesia and taken to the PACU in stable condition.  Needle, sponge, and instrument counts are correct.

## 2017-09-04 NOTE — Discharge Instructions (Addendum)
[place] Office Phone Number [phone]   POST OP INSTRUCTIONS  Always review your discharge instruction sheet given to you by the facility where your surgery was performed.  IF YOU HAVE DISABILITY OR FAMILY LEAVE FORMS, YOU MUST BRING THEM TO THE OFFICE FOR PROCESSING.  DO NOT GIVE THEM TO YOUR DOCTOR.  1. A prescription for pain medication may be given to you upon discharge.  Take your pain medication as prescribed, if needed.  If narcotic pain medicine is not needed, then you may take acetaminophen (Tylenol) or ibuprofen (Advil) as needed. 2. Take your usually prescribed medications unless otherwise directed 3. If you need a refill on your pain medication, please contact your pharmacy.  They will contact our office to request authorization.  Prescriptions will not be filled after 5pm or on week-ends. 4. You should eat very light the first 24 hours after surgery, such as soup, crackers, pudding, etc.  Resume your normal diet the day after surgery 5. It is common to experience some constipation if taking pain medication after surgery.  Increasing fluid intake and taking a stool softener will usually help or prevent this problem from occurring.  A mild laxative (Milk of Magnesia or Miralax) should be taken according to package directions if there are no bowel movements after 48 hours. 6. You may shower in 48 hours.  The surgical glue will flake off in 2-3 weeks.   7. ACTIVITIES:  No strenuous activity or heavy lifting for 1 week.   a. You may drive when you no longer are taking prescription pain medication, you can comfortably wear a seatbelt, and you can safely maneuver your car and apply brakes. b. RETURN TO WORK:  __________1 week or less if tolerated and no lifting._______________ You should see your doctor in the office for a follow-up appointment approximately three-four weeks after your surgery.    WHEN TO CALL YOUR DOCTOR: 1. Fever over 101.0 2. Nausea and/or  vomiting. 3. Extreme swelling or bruising. 4. Continued bleeding from incision. 5. Increased pain, redness, or drainage from the incision.  The clinic staff is available to answer your questions during regular business hours.  Please dont hesitate to call and ask to speak to one of the nurses for clinical concerns.  If you have a medical emergency, go to the nearest emergency room or call 911.  A surgeon from [facility] is always on call at the hospital.  For further questions, please visit [email or web address]

## 2017-09-04 NOTE — Anesthesia Preprocedure Evaluation (Addendum)
Anesthesia Evaluation  Patient identified by MRN, date of birth, ID band Patient awake    Reviewed: Allergy & Precautions, NPO status , Patient's Chart, lab work & pertinent test results  Airway Mallampati: II  TM Distance: >3 FB Neck ROM: Full    Dental no notable dental hx. (+) Teeth Intact, Dental Advisory Given   Pulmonary neg pulmonary ROS,    Pulmonary exam normal breath sounds clear to auscultation       Cardiovascular negative cardio ROS Normal cardiovascular exam Rhythm:Regular Rate:Normal     Neuro/Psych negative neurological ROS  negative psych ROS   GI/Hepatic negative GI ROS, Neg liver ROS,   Endo/Other  Morbid obesity  Renal/GU negative Renal ROS  negative genitourinary   Musculoskeletal negative musculoskeletal ROS (+)   Abdominal   Peds negative pediatric ROS (+)  Hematology negative hematology ROS (+)   Anesthesia Other Findings   Reproductive/Obstetrics negative OB ROS                            Anesthesia Physical Anesthesia Plan  ASA: III  Anesthesia Plan: MAC   Post-op Pain Management:    Induction: Intravenous  PONV Risk Score and Plan: 2 and Ondansetron and Treatment may vary due to age or medical condition  Airway Management Planned: Simple Face Mask  Additional Equipment:   Intra-op Plan:   Post-operative Plan:   Informed Consent: I have reviewed the patients History and Physical, chart, labs and discussed the procedure including the risks, benefits and alternatives for the proposed anesthesia with the patient or authorized representative who has indicated his/her understanding and acceptance.   Dental advisory given  Plan Discussed with: CRNA and Surgeon  Anesthesia Plan Comments:         Anesthesia Quick Evaluation

## 2017-09-04 NOTE — Anesthesia Postprocedure Evaluation (Signed)
Anesthesia Post Note  Patient: Bonnie Larsen  Procedure(s) Performed: REMOVAL PORT-A-CATH (N/A )     Patient location during evaluation: PACU Anesthesia Type: MAC Level of consciousness: awake and alert Pain management: pain level controlled Vital Signs Assessment: post-procedure vital signs reviewed and stable Respiratory status: spontaneous breathing, nonlabored ventilation, respiratory function stable and patient connected to nasal cannula oxygen Cardiovascular status: stable and blood pressure returned to baseline Postop Assessment: no apparent nausea or vomiting Anesthetic complications: no    Last Vitals:  Vitals:   09/04/17 1126 09/04/17 1130  BP: 112/79 108/74  Pulse: 91 86  Resp: 17 16  Temp:  (!) 36.4 C  SpO2: 99% 99%    Last Pain:  Vitals:   09/04/17 1130  TempSrc:   PainSc: Asleep                 Demetric Dunnaway S

## 2017-09-04 NOTE — H&P (Signed)
Bonnie Larsen is an 37 y.o. female.   Chief Complaint: Hodgkin's lymphoma, s/p chemotherapy HPI:  Pt is a 37 yo F who developed abdominal pain and was found to have mesenteric LAD.  She underwent LN bx and was found to have hodgkin's lymphoma.  She underwent therapeutic chemotherapy and is now NED.  She desires port removal.    Past Medical History:  Diagnosis Date  . Anxiety   . Difficult intravenous access   . Family history of adverse reaction to anesthesia    pt's mother has hx. of post-op N/V and hard to wake up post-op  . History of panic attacks    02/08/17- has not had a panic attacks  . Lymphoma ([place]) 01/2017  . Mesenteric lymphadenopathy 01/2017  . Morbid obesity with BMI of 40.0-44.9, adult ([place])   . Runny nose 02/19/2017   clear drainage, per pt.  . Seasonal allergies     Past Surgical History:  Procedure Laterality Date  . LYMPH NODE BIOPSY N/A 02/11/2017   Procedure: HAND ASSISTED LAPAROSCOPIC LYMPH NODE BIOPSY;  Surgeon: Stark Klein, MD;  Location: [facility];  Service: General;  Laterality: N/A;  . PORTACATH PLACEMENT N/A 02/20/2017   Procedure: INSERTION PORT-A-CATH;  Surgeon: Stark Klein, MD;  Location: [facility];  Service: General;  Laterality: N/A;  . TUBAL LIGATION    . UMBILICAL HERNIA REPAIR  02/11/2017    Family History  Problem Relation Age of Onset  . Breast cancer Mother 69  . Fibromyalgia Mother   . Rheum arthritis Mother   . Neuropathy Mother   . Anesthesia problems Mother        post-op N/V; hard to wake up post-op  . Congestive Heart Failure Father        died at age 46  . Cirrhosis Father   . Heart disease Maternal Grandmother   . Diabetes Maternal Grandmother   . Other Brother        Benign spinal tumor  . Cancer Paternal Grandfather        Leukemia   Social History:  reports that  has never smoked. she has never used smokeless tobacco. She reports that she does not drink alcohol or use drugs.  Allergies:  Allergies   Allergen Reactions  . Clindamycin/Lincomycin Swelling    FACIAL SWELLING  . Cymbalta [Duloxetine Hcl] Nausea Only    Causes pt to have nausea and stomach cramping.    Medications Prior to Admission  Medication Sig Dispense Refill  . Biotin 10 MG TABS Take by mouth.    . levonorgestrel (MIRENA) 20 MCG/24HR IUD 1 each by Intrauterine route once.    Marland Kitchen LORazepam (ATIVAN) 0.5 MG tablet Take 1 tablet (0.5 mg total) by mouth every 8 (eight) hours as needed for anxiety. (Patient not taking: Reported on 09/03/2017) 30 tablet 0  . Multiple Vitamin (MULTIVITAMIN) tablet Take 1 tablet by mouth daily.    Marland Kitchen FLUoxetine (PROZAC) 10 MG capsule Take 1 capsule (10 mg total) by mouth daily. 30 capsule 1    Results for orders placed or performed during the hospital encounter of 09/04/17 (from the past 48 hour(s))  Basic metabolic panel     Status: None   Collection Time: 09/04/17  8:47 AM  Result Value Ref Range   Sodium 139 135 - 145 mmol/L   Potassium 4.5 3.5 - 5.1 mmol/L   Chloride 106 101 - 111 mmol/L   CO2 22 22 - 32 mmol/L   Glucose, Bld 93  65 - 99 mg/dL   BUN 10 6 - 20 mg/dL   Creatinine, Ser 0.88 0.44 - 1.00 mg/dL   Calcium 9.5 8.9 - 10.3 mg/dL   GFR calc non Af Amer >60 >60 mL/min   GFR calc Af Amer >60 >60 mL/min    Comment: (NOTE) The eGFR has been calculated using the CKD EPI equation. This calculation has not been validated in all clinical situations. eGFR's persistently <60 mL/min signify possible Chronic Kidney Disease.    Anion gap 11 5 - 15    Comment: Performed at [facility] [address], [place], [place] [address]  CBC     Status: Abnormal   Collection Time: 09/04/17  8:47 AM  Result Value Ref Range   WBC 3.6 (L) 4.0 - 10.5 K/uL   RBC 5.30 (H) 3.87 - 5.11 MIL/uL   Hemoglobin 14.1 12.0 - 15.0 g/dL   HCT 43.4 36.0 - 46.0 %   MCV 81.9 78.0 - 100.0 fL   MCH 26.6 26.0 - 34.0 pg   MCHC 32.5 30.0 - 36.0 g/dL   RDW 12.7 11.5 - 15.5 %   Platelets 218 150 - 400 K/uL     Comment: Performed at [facility], [place] [address] [place], [place] [address]  Pregnancy, urine POC     Status: None   Collection Time: 09/04/17  9:00 AM  Result Value Ref Range   Preg Test, Ur NEGATIVE NEGATIVE    Comment:        THE SENSITIVITY OF THIS METHODOLOGY IS >24 mIU/mL    No results found.  Review of Systems  All other systems reviewed and are negative.   Blood pressure 111/66, pulse 80, temperature 98.2 F (36.8 C), temperature source Oral, resp. rate 18, weight 103.9 kg (229 lb), SpO2 100 %. Physical Exam  Constitutional: She is oriented to person, place, and time. She appears well-developed and well-nourished. No distress.  HENT:  Head: Normocephalic and atraumatic.  Eyes: Conjunctivae are normal. Pupils are equal, round, and reactive to light. No scleral icterus.  Neck: Normal range of motion. No tracheal deviation present.  Cardiovascular: Normal rate.  Respiratory: Effort normal.  Left chest port in place  Neurological: She is alert and oriented to person, place, and time.  Skin: Skin is warm and dry. No rash noted. She is not diaphoretic. No erythema. No pallor.  Psychiatric: She has a normal mood and affect. Her behavior is normal. Judgment and thought content normal.     Assessment/Plan History of hodgkin's lymphoma.  Desires port removal. Reviewed surgery and risks/recovery.    Stark Klein, MD 09/04/2017, 10:04 AM

## 2017-09-04 NOTE — Anesthesia Procedure Notes (Signed)
Procedure Name: MAC Date/Time: 09/04/2017 10:33 AM Performed by: Kyung Rudd, CRNA Pre-anesthesia Checklist: Patient identified, Emergency Drugs available, Suction available and Patient being monitored Patient Re-evaluated:Patient Re-evaluated prior to induction Oxygen Delivery Method: Simple face mask Induction Type: IV induction Placement Confirmation: positive ETCO2

## 2017-09-05 ENCOUNTER — Ambulatory Visit: Payer: 59

## 2017-09-05 DIAGNOSIS — M79604 Pain in right leg: Secondary | ICD-10-CM

## 2017-09-05 DIAGNOSIS — M6281 Muscle weakness (generalized): Secondary | ICD-10-CM

## 2017-09-05 DIAGNOSIS — M79605 Pain in left leg: Secondary | ICD-10-CM

## 2017-09-05 DIAGNOSIS — M549 Dorsalgia, unspecified: Secondary | ICD-10-CM

## 2017-09-05 DIAGNOSIS — R530 Neoplastic (malignant) related fatigue: Secondary | ICD-10-CM | POA: Diagnosis not present

## 2017-09-05 NOTE — Patient Instructions (Addendum)
Hamstring Stretch    Inhale and straighten spine. Exhale and lean forward toward extended leg. Hold position for _20-30__ sec. Inhale and come back to center. Repeat with other leg extended. Repeat _3__ times, alternating legs. Do _3-5__ times per day.  Piriformis Stretch, Sitting    Sit, one ankle on opposite knee, same-side hand on crossed knee. Push down on knee, keeping spine straight. Lean torso forward, with flat back, until tension is felt in hamstrings and gluteals of crossed-leg side. Hold 20-30___ seconds.  Repeat _3__ times per session. Do _3-5__ sessions per day.  Hip Flexor Stretch: Lunge Pose (Wall, Block)    Standing in lunge position, bend front knee until desired stretch is felt in back leg in calf and front of thigh. Hold for _20-30___ sec. Repeat __3__ times each leg, __3-5__ times each day.         Cancer Rehab [phone] HIP: Flexion Standing    Holding onto counter lift leg straight in front keeping knee straight. Slow and controlled! _10__ reps per set, _2-3__ sets per day. Then repeat with other leg.   Hip Extension (Standing)    Stand with support at counter. Squeeze pelvic floor and hold so as not to twist hips and don't lean forward.  Move right leg backward with straight knee. Slow and controlled! Repeat _10__ times. Do _2-3__ times a day. Repeat with other leg.  Hip Abduction (Standing)    Stand with support. Squeeze pelvic floor and hold. Lift right leg out to side, keeping toe forward.  Repeat _10__ times. Do _2-3__ times a day. Repeat with other leg.   Long [employer]    Straighten operated leg and try to hold it __3-5__ seconds. Use __2-3__ lbs on ankle. Repeat __10-20__ times. Do _2-3___ sessions a day.  Seated Alternating Leg Raise (Marching)    Sit on chair. Raise bent knee and return. Repeat with other leg doing this slow and controlled and keeping tummy tight.  Do _1-2__ sets of _10__ repetitions, __2-3__ times a  day.   Seated edge of chair for ball squeeze between knees.  Hold __5__ sec, repeat 20 times; __2-3__ times a day.   Can do lunges at counter 5-10 times each leg. Remember to go straight up and down, don't lean forward at all.   Wall-Sit    With back pressed against a wall, bend knees and slide buttocks down until knees are about 90 or more. Stay in this position for _3-5__ sec. Exhale while straightening legs. Repeat _10-20__ times. Do _3__ times per day.  Can also try less times (3-5) but with longer holds (20-30 sec).

## 2017-09-05 NOTE — Therapy (Signed)
[place], [place], [address] Phone: [phone]   Fax:  [phone]  Physical Therapy Treatment  Patient Details  Name: Bonnie Larsen MRN: [id number] Date of Birth: Jan 07, 1981 Referring Provider: Brunetta Genera, MD   Encounter Date: 09/05/2017  PT End of Session - 09/05/17 1105    Visit Number  2    Number of Visits  8    Date for PT Re-Evaluation  09/25/17    PT Start Time  1022    PT Stop Time  1105    PT Time Calculation (min)  43 min    Activity Tolerance  Patient tolerated treatment well    Behavior During Therapy  [facility] for tasks assessed/performed       Past Medical History:  Diagnosis Date  . Anxiety   . Difficult intravenous access   . Family history of adverse reaction to anesthesia    pt's mother has hx. of post-op N/V and hard to wake up post-op  . History of panic attacks    02/08/17- has not had a panic attacks  . Lymphoma ([place]) 01/2017  . Mesenteric lymphadenopathy 01/2017  . Morbid obesity with BMI of 40.0-44.9, adult ([place])   . Runny nose 02/19/2017   clear drainage, per pt.  . Seasonal allergies     Past Surgical History:  Procedure Laterality Date  . LYMPH NODE BIOPSY N/A 02/11/2017   Procedure: HAND ASSISTED LAPAROSCOPIC LYMPH NODE BIOPSY;  Surgeon: Stark Klein, MD;  Location: [facility];  Service: General;  Laterality: N/A;  . PORTACATH PLACEMENT N/A 02/20/2017   Procedure: INSERTION PORT-A-CATH;  Surgeon: Stark Klein, MD;  Location: [facility];  Service: General;  Laterality: N/A;  . TUBAL LIGATION    . UMBILICAL HERNIA REPAIR  02/11/2017    There were no vitals filed for this visit.  Subjective Assessment - 09/05/17 1025    Subjective  I haven't been hurting for a few days so that's good. I had my port removal yesterday and that went well. It just feels a little sore today, it went better than I thought it would. I walked 2x for 30 mins each since I  was here last and it went okay I was just really SOB. And the stretches she gave me last time are helping as well. I heard about a Yoga class for cancer pts and I'm going to try that next week.     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  No/denies                      [facility] Adult PT Treatment/Exercise - 09/05/17 0001      Knee/Hip Exercises: Stretches   Passive Hamstring Stretch  Right;Left;2 reps;20 seconds    Piriformis Stretch  Right;Left;2 reps;20 seconds      Knee/Hip Exercises: Aerobic   Recumbent Bike  Level 1, x7 mins (pt tolerated this very well)      Knee/Hip Exercises: Standing   Hip Flexion  Stengthening;Right;Left;10 reps 2 lbs each ankle on Airex at back of bike    Forward Lunges  Right;Left;5 reps In treadmill    Hip Abduction  Stengthening;Right;Left;10 reps 2 lbs each ankle on Airex at back of bike  Hip Extension  Stengthening;Right;Left;10 reps 2 lbs each ankle on Airex at back of bike    Wall Squat  10 reps;3 seconds      Knee/Hip Exercises: Seated   Long Arc Quad  Strengthening;Right;Left;15 reps;Weights 3 sec holds    Long Arc Quad Weight  2 lbs.    Ball Squeeze  20x, 5 sec holds    Marching  Strengthening;Right;Left;15 reps;Weights    Marching Weights  2 lbs.             PT Education - 09/05/17 1102    Education provided  Yes    Education Details  Bile LE strength and stretching; also continued to encourage pt to be consistent with walking routine <4x/week for 30 mins. and to begin incorporating HEP into daily routine    Person(s) Educated  Patient    Methods  Explanation;Demonstration;Handout    Comprehension  Verbalized understanding;Returned demonstration;Need further instruction       PT Short Term  Goals - 08/28/17 1535      PT SHORT TERM GOAL #1   Title  Pt will be independent with initial HEP    Baseline  no HEP    Time  1    Period  Weeks    Status  New    Target Date  09/04/17      PT SHORT TERM GOAL #2   Title  Pt will be educated on walking/activity recommendations to improve endurance    Time  1    Period  Weeks    Status  New    Target Date  09/04/17        PT Long Term Goals - 08/28/17 1536      PT LONG TERM GOAL #1   Title  Pt will improve sit to stand in 30 seconds to 16    Baseline  12    Time  4    Period  Weeks    Status  New    Target Date  09/25/17      PT LONG TERM GOAL #2   Title  pt will improve hip flexion and abduction MMT in supine to 4/5 or greater    Time  4    Period  Weeks    Status  New    Target Date  09/04/17      PT LONG TERM GOAL #3   Title  pt will be able to walk around the grocery store without increased SOB    Time  4    Period  Weeks    Status  New    Target Date  09/04/17      PT LONG TERM GOAL #4   Title  pt will report leg pain no greater than 5/10     Baseline  10/10 at worst    Time  4    Period  Weeks    Status  New    Target Date  09/04/17            Plan - 09/05/17 1151    Clinical Impression Statement  Began bil LE strengthening today at pts pace and resting prn. She tolerated exercises and stretching very well just reporting some muscle tightness at end of session from "working the muscles out". Pt has another session tomorrow so encouraged her to just stretch the rest of the day and if needed (too much soreness or fatigue from today) we'll work in supine for core strength tomorrow.     Rehab  Potential  Excellent    PT Frequency  2x / week    PT Duration  4 weeks    PT Treatment/Interventions  Therapeutic exercise;Gait training;Patient/family education    PT Next Visit Plan  Cont bike, review HEP, cont hamstring and lateral hip stretches, endurance and LE strength, if pt too sore or fatigued from  todays session may need to work in supine for core strengthening.  incorporate UE/core as possible    Consulted and Agree with Plan of Care  Patient       Patient will benefit from skilled therapeutic intervention in order to improve the following deficits and impairments:  Decreased strength, Decreased endurance  Visit Diagnosis: Muscle weakness (generalized)  Pain in both lower extremities  Mid-back pain, acute     Problem List Patient Active Problem List   Diagnosis Date Noted  . Migraine 09/03/2017  . Counseling regarding advanced care planning and goals of care 06/24/2017  . 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 Mesenteric lymphadenopathy 02/11/2017  . Adenopathy 01/10/2017  . 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 Abnormal chest CT 01/07/2017  . History of panic attacks   . Bacterial pharyngitis 07/30/2016  . Obesity, Class II, BMI 35-39.9, isolated 04/25/2015    Otelia Limes, PTA 09/05/2017, 12:12 PM  [place], [place], [address] Phone: [phone]   Fax:  [phone]  Name: ANBERLIN DIEZ MRN: [id number] Date of Birth: 1981/05/12

## 2017-09-06 ENCOUNTER — Ambulatory Visit: Payer: 59 | Admitting: Physical Therapy

## 2017-09-06 DIAGNOSIS — M6281 Muscle weakness (generalized): Secondary | ICD-10-CM

## 2017-09-06 DIAGNOSIS — R530 Neoplastic (malignant) related fatigue: Secondary | ICD-10-CM | POA: Diagnosis not present

## 2017-09-06 NOTE — Therapy (Signed)
[place], [place], [address] Phone: [phone]   Fax:  [phone]  Physical Therapy Treatment  Patient Details  Name: Bonnie Larsen MRN: [id number] Date of Birth: 09-25-1980 Referring Provider: Brunetta Genera, MD   Encounter Date: 09/06/2017  PT End of Session - 09/06/17 0930    Visit Number  3    Number of Visits  8    Date for PT Re-Evaluation  09/25/17    Authorization Type  [employer]    PT Start Time  0846    PT Stop Time  0929    PT Time Calculation (min)  43 min    Activity Tolerance  Patient tolerated treatment well    Behavior During Therapy  [facility] for tasks assessed/performed       Past Medical History:  Diagnosis Date  . Anxiety   . Difficult intravenous access   . Family history of adverse reaction to anesthesia    pt's mother has hx. of post-op N/V and hard to wake up post-op  . History of panic attacks    02/08/17- has not had a panic attacks  . Lymphoma ([place]) 01/2017  . Mesenteric lymphadenopathy 01/2017  . Morbid obesity with BMI of 40.0-44.9, adult ([place])   . Runny nose 02/19/2017   clear drainage, per pt.  . Seasonal allergies     Past Surgical History:  Procedure Laterality Date  . LYMPH NODE BIOPSY N/A 02/11/2017   Procedure: HAND ASSISTED LAPAROSCOPIC LYMPH NODE BIOPSY;  Surgeon: Stark Klein, MD;  Location: [facility];  Service: General;  Laterality: N/A;  . PORT-A-CATH REMOVAL N/A 09/04/2017   Procedure: REMOVAL PORT-A-CATH;  Surgeon: Stark Klein, MD;  Location: [facility];  Service: General;  Laterality: N/A;  . PORTACATH PLACEMENT N/A 02/20/2017   Procedure: INSERTION PORT-A-CATH;  Surgeon: Stark Klein, MD;  Location: [facility];  Service: General;  Laterality: N/A;  . TUBAL LIGATION    . UMBILICAL HERNIA REPAIR  02/11/2017    There were no vitals filed for this visit.  Subjective Assessment - 09/06/17 0849    Subjective  I was a little bit tired after  yesterday but I felt pretty good. Just a little bit of soreness in the hips, but that's not unusual.    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  Yes    Pain Score  3     Pain Location  Hip    Pain Orientation  Right;Left    Pain Descriptors / Indicators  Other (Comment) stiffness    Aggravating Factors   with getting out of the car and walking                      [facility] Adult PT Treatment/Exercise - 09/06/17 0001      Exercises   Exercises  Shoulder      Knee/Hip Exercises: Stretches   Passive Hamstring Stretch  Right;Left;1 rep;30 seconds in sitting    Piriformis Stretch  Right;Left;1 rep;30 seconds in sitting      Knee/Hip Exercises: Aerobic   Recumbent Bike  Level 2 x 7 mins. SpO2 94, HR 136 following this      Knee/Hip Exercises: Standing   Lateral Step Up  Right;Left;20 reps;Hand Hold: 1;Step Height: 6" not difficult--could do 8 inch step  Wall Squat  10 seconds;5 reps with ball between back and wall    Other Standing Knee Exercises  hold physioball out front, partial squat as she brings ball down, then straighten knees as she brings ball overhead AND tightens abdominals, 10 reps    Other Standing Knee Exercises  yellow Theraband around ankles, sidestep with mini squat x 25 feet each way right and left; yellow Theraband around ankles, forward walk and backward walk with straight leg x 25 feet each way with the band for resistance HR 135, SpO2 98 following sidestep      Shoulder Exercises: Standing   Other Standing Exercises  1 lb. weight in each hand, alternating punches forward x 30 seconds, in mini squat position             PT Education - 09/05/17 1102    Education provided  Yes    Education Details  Bile LE strength and stretching; also continued to encourage pt to be consistent  with walking routine <4x/week for 30 mins. and to begin incorporating HEP into daily routine    Person(s) Educated  Patient    Methods  Explanation;Demonstration;Handout    Comprehension  Verbalized understanding;Returned demonstration;Need further instruction       PT Short Term Goals - 08/28/17 1535      PT SHORT TERM GOAL #1   Title  Pt will be independent with initial HEP    Baseline  no HEP    Time  1    Period  Weeks    Status  New    Target Date  09/04/17      PT SHORT TERM GOAL #2   Title  Pt will be educated on walking/activity recommendations to improve endurance    Time  1    Period  Weeks    Status  New    Target Date  09/04/17        PT Long Term Goals - 08/28/17 1536      PT LONG TERM GOAL #1   Title  Pt will improve sit to stand in 30 seconds to 16    Baseline  12    Time  4    Period  Weeks    Status  New    Target Date  09/25/17      PT LONG TERM GOAL #2   Title  pt will improve hip flexion and abduction MMT in supine to 4/5 or greater    Time  4    Period  Weeks    Status  New    Target Date  09/04/17      PT LONG TERM GOAL #3   Title  pt will be able to walk around the grocery store without increased SOB    Time  4    Period  Weeks    Status  New    Target Date  09/04/17      PT LONG TERM GOAL #4   Title  pt will report leg pain no greater than 5/10     Baseline  10/10 at worst    Time  4    Period  Weeks    Status  New    Target Date  09/04/17            Plan - 09/06/17 0930    Clinical Impression Statement  Pt. was challenged by exercises today and needed a couple of short rest breaks, having had heart rate increase to approx. 135 bpm  with activity.  She reported that the exercise was not too much, though, and that "that was a good workout" at the end of session.  She definitely felt her hip muscle groups got a workout, and was asked to let us know if that results in particular soreness after today.    Rehab Potential  Excellent     PT Frequency  2x / week    PT Duration  4 weeks    PT Treatment/Interventions  Therapeutic exercise;Gait training;Patient/family education    PT Next Visit Plan  Cont bike, review HEP, cont hamstring and lateral hip stretches, endurance and LE strength,  incorporate UE/core as possible    PT Home Exercise Plan  given handout 3/6: hamstring stretch, SLR, quad set, piriformis stretch, row with red band       Patient will benefit from skilled therapeutic intervention in order to improve the following deficits and impairments:  Decreased strength, Decreased endurance  Visit Diagnosis: Muscle weakness (generalized)     Problem List Patient Active Problem List   Diagnosis Date Noted  . Migraine 09/03/2017  . Counseling regarding advanced care planning and goals of care 06/24/2017  . 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 Mesenteric lymphadenopathy 02/11/2017  . Adenopathy 01/10/2017  . 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 Abnormal chest CT 01/07/2017  . History of panic attacks   . Bacterial pharyngitis 07/30/2016  . Obesity, Class II, BMI 35-39.9, isolated 04/25/2015    Jaelyne Deeg 09/06/2017, 9:33 AM  [facility] [place], [place], [address] Phone: [phone]   Fax:  [phone]  Name: Bonnie Larsen MRN: [id number] Date of Birth: 1980/08/11  Serafina Royals, PT 09/06/17 9:34 AM

## 2017-09-09 ENCOUNTER — Encounter: Payer: 59 | Admitting: Rehabilitation

## 2017-09-11 ENCOUNTER — Encounter: Payer: Self-pay | Admitting: Rehabilitation

## 2017-09-11 ENCOUNTER — Ambulatory Visit: Payer: 59 | Admitting: Rehabilitation

## 2017-09-11 DIAGNOSIS — R530 Neoplastic (malignant) related fatigue: Secondary | ICD-10-CM | POA: Diagnosis not present

## 2017-09-11 DIAGNOSIS — M6281 Muscle weakness (generalized): Secondary | ICD-10-CM

## 2017-09-11 DIAGNOSIS — Z7409 Other reduced mobility: Secondary | ICD-10-CM

## 2017-09-11 NOTE — Therapy (Addendum)
[facility] [address] [place], [place], [address] Phone: [phone]   Fax:  [phone]  Physical Therapy Treatment  Patient Details  Name: Bonnie Larsen MRN: [id number] Date of Birth: 11/06/1980 Referring Provider: Kishore Gautam Kale, MD   Encounter Date: 09/11/2017  PT End of Session - 09/11/17 1419    Visit Number  4    Number of Visits  8    Date for PT Re-Evaluation  09/25/17    Authorization Type  [employer]    PT Start Time  1345    PT Stop Time  1428    PT Time Calculation (min)  43 min    Activity Tolerance  Patient tolerated treatment well    Behavior During Therapy  WFL for tasks assessed/performed       Past Medical History:  Diagnosis Date  . Anxiety   . Difficult intravenous access   . Family history of adverse reaction to anesthesia    pt's mother has hx. of post-op N/V and hard to wake up post-op  . History of panic attacks    02/08/17- has not had a panic attacks  . Lymphoma (HCC) 01/2017  . Mesenteric lymphadenopathy 01/2017  . Morbid obesity with BMI of 40.0-44.9, adult (HCC)   . Runny nose 02/19/2017   clear drainage, per pt.  . Seasonal allergies     Past Surgical History:  Procedure Laterality Date  . LYMPH NODE BIOPSY N/A 02/11/2017   Procedure: HAND ASSISTED LAPAROSCOPIC LYMPH NODE BIOPSY;  Surgeon: Byerly, Faera, MD;  Location: [facility];  Service: General;  Laterality: N/A;  . PORT-A-CATH REMOVAL N/A 09/04/2017   Procedure: REMOVAL PORT-A-CATH;  Surgeon: Byerly, Faera, MD;  Location: [facility] OR;  Service: General;  Laterality: N/A;  . PORTACATH PLACEMENT N/A 02/20/2017   Procedure: INSERTION PORT-A-CATH;  Surgeon: Byerly, Faera, MD;  Location: [facility];  Service: General;  Laterality: N/A;  . TUBAL LIGATION    . UMBILICAL HERNIA REPAIR  02/11/2017    There were no vitals filed for this visit.  Subjective Assessment - 09/11/17 1344    Subjective  Feeling good today.  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  No/denies                      [facility] Adult PT Treatment/Exercise - 09/11/17 0001      Exercises   Exercises  Lumbar      Lumbar Exercises: Stretches   Passive Hamstring Stretch  Right;Left;2 reps;30 seconds    Single Knee to Chest Stretch  2 reps;Left;Right;30 seconds    Single Knee to Chest Stretch Limitations  to opposite shoulder      Lumbar Exercises: Machines for Strengthening   Elliptical  level 5 x3min      Lumbar Exercises: Supine   Clam  20 reps    Clam Limitations  red band hooklying    Bridge  10 reps    Bridge Limitations  5' hold      Knee/Hip Exercises: Aerobic   Recumbent Bike  Level 2 x 8 mins.      Knee/Hip Exercises: Standing   Lateral   Step Up  Both;1 set;10 reps;Step Height: 6" overhead press 2# first set and lateral raise 2# second set    Forward Step Up  1 set;Step Height: 6";Both    Wall Squat  10 seconds;5 reps with yellow ball overhead raise    Other Standing Knee Exercises  red band side steps 25ft x 2 and monster walks 25ft x 2               PT Short Term Goals - 09/11/17 1424      PT SHORT TERM GOAL #1   Title  Pt will be independent with initial HEP    Status  Achieved      PT SHORT TERM GOAL #2   Title  Pt will be educated on walking/activity recommendations to improve endurance    Status  Achieved        PT Long Term Goals - 09/11/17 1424      PT LONG TERM GOAL #1   Title  Pt will improve sit to stand in 30 seconds to 16    Status  On-going      PT LONG TERM GOAL #2   Title  pt will improve hip flexion and abduction MMT in supine to 4/5 or greater    Status  On-going       PT LONG TERM GOAL #3   Title  pt will be able to walk around the grocery store without increased SOB    Status  On-going      PT LONG TERM GOAL #4   Title  pt will report leg pain no greater than 5/10     Status  Achieved            Plan - 09/11/17 1421    Clinical Impression Statement  Pt did very well today.  Added ellipitcal and some supine core work without issues.  She will be looking into the [facility] for discharge     Clinical Presentation  Stable    Rehab Potential  Excellent    PT Frequency  2x / week    PT Duration  4 weeks    PT Treatment/Interventions  Therapeutic exercise;Gait training;Patient/family education    PT Next Visit Plan  continue with endurance, LE/core/UE strengthening        Patient will benefit from skilled therapeutic intervention in order to improve the following deficits and impairments:     Visit Diagnosis: Decreased functional mobility and endurance  Muscle weakness (generalized)     Problem List Patient Active Problem List   Diagnosis Date Noted  . Migraine 09/03/2017  . Counseling regarding advanced care planning and goals of care 06/24/2017  . 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 Mesenteric lymphadenopathy 02/11/2017  . Adenopathy 01/10/2017  . 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 Abnormal chest CT 01/07/2017  . History of panic attacks   . Bacterial pharyngitis 07/30/2016  . Obesity, Class II, BMI 35-39.9, isolated 04/25/2015    Tevis, Kara R, DPT, CMP 09/11/2017, 2:31 PM  [facility] [address] [place], [place], [address] Phone: [phone]   Fax:  [phone]  Name: Bonnie Larsen MRN: [id number] Date of Birth: 04/23/1981   PHYSICAL THERAPY DISCHARGE SUMMARY  Visits from Start of Care: 4  Current functional level related to goals / functional outcomes: See above   Remaining deficits: Need for  community exercise program    Education / Equipment: Pt joined [employer]    Plan: Patient agrees to discharge.  Patient goals were partially met. Patient is being discharged due to being pleased with the current functional level.  ?????    Shan Levans, PT

## 2017-09-16 ENCOUNTER — Encounter: Payer: 59 | Admitting: Rehabilitation

## 2017-09-18 ENCOUNTER — Encounter: Payer: 59 | Admitting: Rehabilitation

## 2017-09-23 ENCOUNTER — Ambulatory Visit: Payer: 59 | Attending: Hematology | Admitting: Rehabilitation

## 2017-09-25 ENCOUNTER — Encounter: Payer: 59 | Admitting: Rehabilitation

## 2017-09-27 ENCOUNTER — Ambulatory Visit (HOSPITAL_COMMUNITY): Payer: 59 | Admitting: Psychiatry

## 2017-09-30 ENCOUNTER — Encounter: Payer: 59 | Admitting: Rehabilitation

## 2017-10-02 ENCOUNTER — Ambulatory Visit: Payer: 59 | Admitting: Rehabilitation

## 2017-10-31 NOTE — Progress Notes (Signed)
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   HISTORY OF PRESENTING ILLNESS:  Bonnie Larsen is a wonderful 37 y.o. female who has been referred to [place] by Dr .Patient, No Pcp Per for evaluation and management of thoracic paraspinal masses and upper abdominal extensive lymphadenopathy.  Patient has a history of uterine fibroids, right breast benign lesion biopsied in 2016, anxiety/panic attacks, migraine headaches and chronic sinus issues who presented to the emergency room brought by EMS for chest pain and shortness of breath about 7-10 days ago. She was noted to be hypertensive with a blood pressure of 180/110 which improved with nitroglycerin. She was also having some back pain and felt like her chest pain was radiating to the back. D-dimer level was borderline elevated. She had a CTA of the chest on 01/06/2017 that showed no evidence of pulmonary embolism. She was however noted to have Multiple prevertebral paraspinal masses, differential diagnosis includes extramedullary hematopoiesis and metastasis/lymphadenopathy. Recommend correlation for laboratory analysis for anemia and lymphoproliferative disease.  Patient was subsequently seen by her primary care physician and had a CT of the abdomen and pelvis with contrast on 01/09/2017 which showed Extensive upper abdominal adenopathy favoring lymphoma/lymphoproliferative disease, tissue diagnosis recommended. No splenomegaly or focal splenic lesion.  She was referred to [place] for further evaluation and management.  She was here with her mother and husband.  She notes no abdominal, or chest trauma. No fevers chills night sweats or unexpected weight loss. Mild constipation but no other change in bowel habits.  No GI bleeding noted. No change in food intake or swallowing. No urinary discomfort or abnormalities. Currently having no chest pain or shortness of breath. Mild upper abdominal discomfort. No other obvious peripheral lymphadenopathy noted.   PREVIOUS THERAPY:    R-CHOP x 6 cycles (Fanale et al 2010)   INTERVAL HISTORY   Bonnie Larsen is being seen earlier than previously scheduled due to new abdominal pain and bothersome migrainous headaches. She has been seeing me for her Lymphocyte predominant Hodgkins lymphoma. Since her last visit she had a PAC removal by Dr. Barry Dienes on 09/04/17.   She presents to the clinic today noting lately she has not been doing well. She notes overall she has been having more good days than bad days. She notes her hair is starting to grow back. She notes things are getting back normal at home for her. She notes she does fell stressed at work. She notes she was previously prescribed Prozac by her Psychologist. But she did not tolerate it. She stopped and notes she rather not take medication except Ativan as needed.   On review of symptoms, pt notes epigastric and lower abdominal pain that would radiate to her lower back. She denies any UTI symptoms. This pain comes and goes mostly, but last week the pain persisted. This pain reduced 5 days ago. The pain is most noticeable when she is sitting up for a long time. Otherwise the pain will be in the b/l flank area when laying down.  She notes migraines that are more frequent and effected her visions. She has tried Excedrin and cold compress. The Excedrin are not working as well for her. She notes this migraine occurs more with sodas, candy and computer screen time. She denies increase  in screen time with returning to work in March 2019. She denies fever. She notes to having chills.  She has had light vaginal spotting in March, but full period has not returned. She notes her fibroid pain is usually unilateral. She denies change  in bowel habits.  She notes pain in her legs are stable. Numbness returned in her feet last week. She notes she sits at work for several hours a day.  She notes she has gained weight, almost 20 pounds since January. She does not do much exercise lately. She was doing well with PT before. She has SOB upon exertion only.  She notes her appetite does not allow for her to always have 3 meals a day.    MEDICAL HISTORY:  Past Medical History:  Diagnosis Date  . Anxiety   . Difficult intravenous access   . Family history of adverse reaction to anesthesia    pt's mother has hx. of post-op N/V and hard to wake up post-op  . History of panic attacks    02/08/17- has not had a panic attacks  . Lymphoma ([place]) 01/2017  . Mesenteric lymphadenopathy 01/2017  . Morbid obesity with BMI of 40.0-44.9, adult ([place])   . Runny nose 02/19/2017   clear drainage, per pt.  . Seasonal allergies     SURGICAL HISTORY: Past Surgical History:  Procedure Laterality Date  . LYMPH NODE BIOPSY N/A 02/11/2017   Procedure: HAND ASSISTED LAPAROSCOPIC LYMPH NODE BIOPSY;  Surgeon: Stark Klein, MD;  Location: [facility];  Service: General;  Laterality: N/A;  . PORT-A-CATH REMOVAL N/A 09/04/2017   Procedure: REMOVAL PORT-A-CATH;  Surgeon: Stark Klein, MD;  Location: [facility];  Service: General;  Laterality: N/A;  . PORTACATH PLACEMENT N/A 02/20/2017   Procedure: INSERTION PORT-A-CATH;  Surgeon: Stark Klein, MD;  Location: [facility];  Service: General;  Laterality: N/A;  . TUBAL LIGATION    . UMBILICAL HERNIA REPAIR  02/11/2017    SOCIAL HISTORY: Social History   Socioeconomic History  . Marital status: Married    Spouse name: Gwyndolyn Saxon  . Number of children: 2  . Years of education: Not on file  . Highest education level: Associate degree: occupational, Hotel manager, or vocational program  Occupational History  . Not on file  Social Needs  . Financial resource strain: Not hard at all  . Food insecurity:      Worry: Never true    Inability: Never true  . Transportation needs:    Medical: No    Non-medical: No  Tobacco Use  . Smoking status: Never Smoker  . Smokeless tobacco: Never Used  Substance and Sexual Activity  . Alcohol use: No    Alcohol/week: 0.0 oz  . Drug use: No  . Sexual activity: Yes    Partners: Male    Birth control/protection: IUD    Comment: tubal ligation  Lifestyle  . Physical activity:    Days per week: 0 days    Minutes per session: 0 min  . Stress: Rather much  Relationships  . Social connections:    Talks on phone: More than three times a week    Gets together: Never    Attends religious service: Never    Active member of club or organization: No    Attends meetings of clubs or organizations: Never    Relationship status: Married  . Intimate partner violence:    Fear of current or ex partner: No    Emotionally abused: No  Physically abused: No    Forced sexual activity: No  Other Topics Concern  . Not on file  Social History Narrative  . Not on file    FAMILY HISTORY: Family History  Problem Relation Age of Onset  . Breast cancer Mother 40  . Fibromyalgia Mother   . Rheum arthritis Mother   . Neuropathy Mother   . Anesthesia problems Mother        post-op N/V; hard to wake up post-op  . Congestive Heart Failure Father        died at age 40  . Cirrhosis Father   . Heart disease Maternal Grandmother   . Diabetes Maternal Grandmother   . Other Brother        Benign spinal tumor  . Cancer Paternal Grandfather        Leukemia    ALLERGIES:  is allergic to clindamycin/lincomycin and cymbalta [duloxetine hcl].  MEDICATIONS:  Current Outpatient Medications  Medication Sig Dispense Refill  . Biotin 10 MG TABS Take by mouth.    . levonorgestrel (MIRENA) 20 MCG/24HR IUD 1 each by Intrauterine route once.    Marland Kitchen LORazepam (ATIVAN) 0.5 MG tablet Take 1 tablet (0.5 mg total) by mouth every 8 (eight) hours as needed for anxiety. 30 tablet 0   . Multiple Vitamin (MULTIVITAMIN) tablet Take 1 tablet by mouth daily.     No current facility-administered medications for this visit.    Facility-Administered Medications Ordered in Other Visits  Medication Dose Route Frequency Provider Last Rate Last Dose  . sodium chloride flush (NS) 0.9 % injection 10 mL  10 mL Intracatheter PRN Brunetta Genera, MD   10 mL at 03/26/17 1724  . sodium chloride flush (NS) 0.9 % injection 10 mL  10 mL Intravenous PRN Brunetta Genera, MD   10 mL at 04/16/17 1938    REVIEW OF SYSTEMS:  .10 Point review of Systems was done is negative except as noted above.   PHYSICAL EXAMINATION:  ECOG PERFORMANCE STATUS: 1 - Symptomatic but completely ambulatory  Vitals:   11/01/17 0846  BP: 117/74  Pulse: 88  Resp: 18  Temp: 98.4 F (36.9 C)  SpO2: 100%   Filed Weights   11/01/17 0846  Weight: 241 lb 3.2 oz (109.4 kg)   .Body mass index is 42.73 kg/m.  GENERAL:alert, in no acute distress and comfortable SKIN: no acute rashes, no significant lesions EYES: conjunctiva are pink and non-injected, sclera anicteric OROPHARYNX: MMM, no exudates, no oropharyngeal erythema or ulceration NECK: supple, no JVD LYMPH:  no palpable lymphadenopathy in the cervical, axillary or inguinal regions LUNGS: clear to auscultation b/l with normal respiratory effort HEART: regular rate & rhythm ABDOMEN:  normoactive bowel sounds , minimal nonfocal mid abdominal  tenderness, not distended. Extremity: no pedal edema PSYCH: alert & oriented x 3 with fluent speech NEURO: no focal motor/sensory deficits  LABORATORY DATA:  I have reviewed the data as listed   . CBC Latest Ref Rng & Units 11/01/2017 09/04/2017 08/14/2017  WBC 3.9 - 10.3 K/uL 3.4(L) 3.6(L) 3.7(L)  Hemoglobin 11.6 - 15.9 g/dL 14.3 14.1 13.4  Hematocrit 34.8 - 46.6 % 43.7 43.4 40.8  Platelets 145 - 400 K/uL 216 218 211     . CMP Latest Ref Rng & Units 11/06/2017 11/01/2017 09/04/2017  Glucose 70 -  140 mg/dL 84 98 93  BUN 7 - 26 mg/dL 12 9 10   Creatinine 0.60 - 1.10 mg/dL 1.09 0.95 0.88  Sodium 136 - 145 mmol/L  139 139 139  Potassium 3.5 - 5.1 mmol/L 4.5 4.3 4.5  Chloride 98 - 109 mmol/L 104 104 106  CO2 22 - 29 mmol/L 29 28 22   Calcium 8.4 - 10.4 mg/dL 9.6 9.5 9.5  Total Protein 6.4 - 8.3 g/dL 6.9 7.0 -  Total Bilirubin 0.2 - 1.2 mg/dL 0.3 0.3 -  Alkaline Phos 40 - 150 U/L 58 50 -  AST 5 - 34 U/L 17 22 -  ALT 0 - 55 U/L 15 20 -   Component     Latest Ref Rng & Units 01/14/2017  LDH     125 - 245 U/L 193  HIV Screen 4th Generation wRfx     Non Reactive Non Reactive  Hep C Virus Ab     0.0 - 0.9 s/co ratio <0.1  hCG,Beta Subunit,Qual,Serum     Negative <6 mIU/mL Negative  Sed Rate     0 - 32 mm/hr 7   Lymphnode biopsy, 02/11/2017 Diagnosis Lymph node for lymphoma, Mesenteric Lymphadenopathy - HODGKIN LYMPHOMA.         PROCEDURES  ECHO 03/13/17 Study Conclusions - Left ventricle: The cavity size was normal. Systolic function was   normal. The estimated ejection fraction was in the range of 55%   to 60%. Wall motion was normal; there were no regional wall   motion abnormalities. Left ventricular diastolic function   parameters were normal. - Atrial septum: No defect or patent foramen ovale was identified. - Impressions: Normal GLS -20.2.   RADIOGRAPHIC STUDIES: I have personally reviewed the radiological images as listed and agreed with the findings in the report. No results found.    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 -PET/CT showed fairly active disease. Abdominal disease if progression occurs could potentially cause organ injury. -LDH level and  sedimentation rate within normal limits . -HIV negative  -Hepatitis C negative.  -Baseline ECHO from 03/13/17 shows normal heart function, EF 55%-60%  Rpt PET/CT on 05/29/2017 - showed Significant reduction in size and metabolic activity of the formerly extensive adenopathy in the chest and abdomen, with primarily Deauville 3 activity today, and some Deauville 2 activity. Previously this was Deauville 5.  S/p R-CHOP x 6 cycles  PET/CT 08/06/2017: No evidence active lymphoma with minimal activity within the remaining periaortic and mesenteric lymph nodes - Deauville 2  2) Abdominal pain-  Lower abd. Plan  -She had her PAC removal completed on 09/04/17 -Labs reviewed with pt, mild leukopenia at 3.4 and lymphocyte count at 0.6. CMP and LDH are WNL. -CT abd/pelvis to evaluate abdominal pain and r/o lymphoma progression (though less likely).  -UA and UCx done - no overt evidence of UTI  3) Patient Active Problem List   Diagnosis Date Noted  . Migraine 09/03/2017  . Counseling regarding advanced care planning and goals of care 06/24/2017  . 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 Mesenteric lymphadenopathy 02/11/2017  . Adenopathy 01/10/2017  . 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 Abnormal chest CT 01/07/2017  . History of panic attacks   . Bacterial pharyngitis 07/30/2016  . Obesity, Class II, BMI 35-39.9, isolated 04/25/2015  -continue f/u with PCP for other chronic medical issues  4) Anxiety -ativan prn -previously given  referral to see a psychologist for counseling -She was previously tried Prozac but did not tolerate well. She rather not take medication if possible so she will continue Ativan only as needed.   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 -continue taking Vitamin B complex with meals to assist with this.   -Mild progression with return of numbness to ball of feet and still has leg pain,  will monitor.   6) Headaches ? Stress headache vs Migraines  PLAN  -Migraines are likely stressed induced. If still progresses I recommend she see her PCP  -I highly recommend she be more active to help de-istress and help with body aches.    CT abd/pelvis in 3-4 days Additional urine labs today RTC with Dr [name] in 7 days  All of the patients questions were answered with apparent satisfaction. The patient knows to call the clinic with any problems, questions or concerns.  The total time spent in the appointment was 20 minutes and more than 50% was on counseling and direct patient cares.  Sullivan Lone MD [place] [facility] Hematology/Oncology Physician [facility]  (Office):       [phone] (Work cell):  [phone] (Fax):           [phone]  This document serves as a record of services personally performed by Sullivan Lone, MD. It was created on his behalf by Joslyn Devon, a trained medical scribe. The creation of this record is based on the scribe's personal observations and the provider's statements to them.    .I have reviewed the above documentation for accuracy and completeness, and I agree with the above.   Brunetta Genera MD MS

## 2017-11-01 ENCOUNTER — Inpatient Hospital Stay: Payer: 59

## 2017-11-01 ENCOUNTER — Encounter: Payer: Self-pay | Admitting: Hematology

## 2017-11-01 ENCOUNTER — Telehealth: Payer: Self-pay | Admitting: Hematology

## 2017-11-01 ENCOUNTER — Other Ambulatory Visit: Payer: Self-pay | Admitting: *Deleted

## 2017-11-01 ENCOUNTER — Inpatient Hospital Stay: Payer: 59 | Attending: Hematology | Admitting: Hematology

## 2017-11-01 VITALS — BP 117/74 | HR 88 | Temp 98.4°F | Resp 18 | Ht 63.0 in | Wt 241.2 lb

## 2017-11-01 DIAGNOSIS — R51 Headache: Secondary | ICD-10-CM | POA: Diagnosis not present

## 2017-11-01 DIAGNOSIS — F419 Anxiety disorder, unspecified: Secondary | ICD-10-CM | POA: Insufficient documentation

## 2017-11-01 DIAGNOSIS — G629 Polyneuropathy, unspecified: Secondary | ICD-10-CM

## 2017-11-01 DIAGNOSIS — C8108 Nodular lymphocyte predominant Hodgkin lymphoma, lymph nodes of multiple sites: Secondary | ICD-10-CM

## 2017-11-01 DIAGNOSIS — R109 Unspecified abdominal pain: Secondary | ICD-10-CM

## 2017-11-01 LAB — CMP (CANCER CENTER ONLY)
ALBUMIN: 3.8 g/dL (ref 3.5–5.0)
ALT: 20 U/L (ref 0–55)
ANION GAP: 7 (ref 3–11)
AST: 22 U/L (ref 5–34)
Alkaline Phosphatase: 50 U/L (ref 40–150)
BUN: 9 mg/dL (ref 7–26)
CO2: 28 mmol/L (ref 22–29)
Calcium: 9.5 mg/dL (ref 8.4–10.4)
Chloride: 104 mmol/L (ref 98–109)
Creatinine: 0.95 mg/dL (ref 0.60–1.10)
GFR, Est AFR Am: >60 mL/min (ref >60)
GFR, Estimated: >60 mL/min (ref >60)
GLUCOSE: 98 mg/dL (ref 70–140)
POTASSIUM: 4.3 mmol/L (ref 3.5–5.1)
Sodium: 139 mmol/L (ref 136–145)
Total Bilirubin: 0.3 mg/dL (ref 0.2–1.2)
Total Protein: 7 g/dL (ref 6.4–8.3)

## 2017-11-01 LAB — RETICULOCYTES
RBC.: 5.51 MIL/uL — ABNORMAL HIGH (ref 3.70–5.45)
RETIC COUNT ABSOLUTE: 71.6 10*3/uL (ref 33.7–90.7)
Retic Ct Pct: 1.3 % (ref 0.7–2.1)

## 2017-11-01 LAB — URINALYSIS, COMPLETE (UACMP) WITH MICROSCOPIC
Bilirubin Urine: NEGATIVE
GLUCOSE, UA: NEGATIVE mg/dL
HGB URINE DIPSTICK: NEGATIVE
Ketones, ur: NEGATIVE mg/dL
Leukocytes, UA: NEGATIVE
NITRITE: NEGATIVE
PROTEIN: NEGATIVE mg/dL
Specific Gravity, Urine: 1.013 (ref 1.005–1.030)
pH: 6 (ref 5.0–8.0)

## 2017-11-01 LAB — CBC WITH DIFFERENTIAL (CANCER CENTER ONLY)
Basophils Absolute: 0 10*3/uL (ref 0.0–0.1)
Basophils Relative: 0 %
EOS PCT: 2 %
Eosinophils Absolute: 0.1 10*3/uL (ref 0.0–0.5)
HEMATOCRIT: 43.7 % (ref 34.8–46.6)
Hemoglobin: 14.3 g/dL (ref 11.6–15.9)
LYMPHS ABS: 0.6 10*3/uL — AB (ref 0.9–3.3)
LYMPHS PCT: 17 %
MCH: 26 pg (ref 25.1–34.0)
MCHC: 32.7 g/dL (ref 31.5–36.0)
MCV: 79.3 fL — AB (ref 79.5–101.0)
MONO ABS: 0.5 10*3/uL (ref 0.1–0.9)
MONOS PCT: 14 %
NEUTROS ABS: 2.3 10*3/uL (ref 1.5–6.5)
Neutrophils Relative %: 67 %
PLATELETS: 216 10*3/uL (ref 145–400)
RBC: 5.51 MIL/uL — ABNORMAL HIGH (ref 3.70–5.45)
RDW: 13.7 % (ref 11.2–14.5)
WBC Count: 3.4 10*3/uL — ABNORMAL LOW (ref 3.9–10.3)

## 2017-11-01 LAB — PREGNANCY, URINE: Preg Test, Ur: NEGATIVE

## 2017-11-01 LAB — LACTATE DEHYDROGENASE: LDH: 126 U/L (ref 125–245)

## 2017-11-01 LAB — SEDIMENTATION RATE: Sed Rate: 5 mm/hr (ref 0–22)

## 2017-11-01 NOTE — Telephone Encounter (Signed)
Appointments scheduled AVS/Calendar printed per 5/10 los °

## 2017-11-01 NOTE — Patient Instructions (Signed)
Thank you for choosing [facility] to provide your oncology and hematology care.  To afford each patient quality time with our providers, please arrive 30 minutes before your scheduled appointment time.  If you arrive late for your appointment, you may be asked to reschedule.  We strive to give you quality time with our providers, and arriving late affects you and other patients whose appointments are after yours.   If you are a no show for multiple scheduled visits, you may be dismissed from the clinic at the providers discretion.    Again, thank you for choosing [facility], our hope is that these requests will decrease the amount of time that you wait before being seen by our physicians.  ______________________________________________________________________  Should you have questions after your visit to the [facility], please contact our office at [phone] between the hours of 8:30 and 4:30 p.m.    Voicemails left after 4:30p.m will not be returned until the following business day.    For prescription refill requests, please have your pharmacy contact us directly.  Please also try to allow 48 hours for prescription requests.    Please contact the scheduling department for questions regarding scheduling.  For scheduling of procedures such as PET scans, CT scans, MRI, Ultrasound, etc please contact central scheduling at [phone].    Resources For Cancer Patients and Caregivers:   Oncolink.org:  A wonderful resource for patients and healthcare providers for information regarding your disease, ways to tract your treatment, what to expect, etc.     American Cancer Society:  800-227-2345  Can help patients locate various types of support and financial assistance  Cancer Care: 1-800-813-HOPE (4673) Provides financial assistance, online support groups, medication/co-pay assistance.    [place] DSS:  336-641-3447 Where to apply for food  stamps, Medicaid, and utility assistance  Medicare Rights Center: 800-333-4114 Helps people with Medicare understand their rights and benefits, navigate the Medicare system, and secure the quality healthcare they deserve  SCAT: 336-333-6589 [place] [employer]'s shared-ride transportation service for eligible riders who have a disability that prevents them from riding the fixed route bus.    For additional information on assistance programs please contact our social worker:   Grier Hock/Abigail Elmore:  [phone]            

## 2017-11-02 LAB — URINE CULTURE: Culture: NO GROWTH

## 2017-11-04 ENCOUNTER — Other Ambulatory Visit: Payer: Self-pay | Admitting: *Deleted

## 2017-11-04 DIAGNOSIS — C8108 Nodular lymphocyte predominant Hodgkin lymphoma, lymph nodes of multiple sites: Secondary | ICD-10-CM

## 2017-11-05 ENCOUNTER — Inpatient Hospital Stay: Payer: 59

## 2017-11-05 ENCOUNTER — Other Ambulatory Visit: Payer: Self-pay

## 2017-11-05 DIAGNOSIS — C8108 Nodular lymphocyte predominant Hodgkin lymphoma, lymph nodes of multiple sites: Secondary | ICD-10-CM

## 2017-11-06 ENCOUNTER — Inpatient Hospital Stay: Payer: 59

## 2017-11-06 DIAGNOSIS — C8108 Nodular lymphocyte predominant Hodgkin lymphoma, lymph nodes of multiple sites: Secondary | ICD-10-CM | POA: Diagnosis not present

## 2017-11-06 LAB — CMP (CANCER CENTER ONLY)
ALK PHOS: 58 U/L (ref 40–150)
ALT: 15 U/L (ref 0–55)
AST: 17 U/L (ref 5–34)
Albumin: 3.9 g/dL (ref 3.5–5.0)
Anion gap: 6 (ref 3–11)
BUN: 12 mg/dL (ref 7–26)
CALCIUM: 9.6 mg/dL (ref 8.4–10.4)
CHLORIDE: 104 mmol/L (ref 98–109)
CO2: 29 mmol/L (ref 22–29)
CREATININE: 1.09 mg/dL (ref 0.60–1.10)
Glucose, Bld: 84 mg/dL (ref 70–140)
Potassium: 4.5 mmol/L (ref 3.5–5.1)
SODIUM: 139 mmol/L (ref 136–145)
Total Bilirubin: 0.3 mg/dL (ref 0.2–1.2)
Total Protein: 6.9 g/dL (ref 6.4–8.3)

## 2017-11-06 LAB — PREGNANCY, URINE: Preg Test, Ur: NEGATIVE

## 2017-11-07 ENCOUNTER — Telehealth: Payer: Self-pay

## 2017-11-07 NOTE — Telephone Encounter (Signed)
Pt called requesting lab results from yesterday. Specifically, pt wanted to know her ctn level and pregnancy test result. Ctn WNL at 1.09 and pregnancy test negative. Pt thanks for the f/u call and information.

## 2017-11-12 ENCOUNTER — Telehealth: Payer: Self-pay | Admitting: *Deleted

## 2017-11-12 NOTE — Telephone Encounter (Signed)
Pt scheduled for MD visit 5/22, CT scan not completed.  Pt stated she never heard from central scheduling to schedule scan.  Pt given number to central scheduling department.  Pt advised 5/22 MD visit would be cancelled and would be rescheduled after CT scan is scheduled.  Pt verbalized understanding.

## 2017-11-13 ENCOUNTER — Other Ambulatory Visit: Payer: 59

## 2017-11-13 ENCOUNTER — Inpatient Hospital Stay: Payer: 59 | Admitting: Hematology

## 2017-11-14 ENCOUNTER — Telehealth: Payer: Self-pay | Admitting: *Deleted

## 2017-11-14 ENCOUNTER — Ambulatory Visit (HOSPITAL_COMMUNITY)
Admission: RE | Admit: 2017-11-14 | Discharge: 2017-11-14 | Disposition: A | Discharge status: Home / Self Care | Payer: 59 | Source: Ambulatory Visit | Attending: Hematology | Admitting: Hematology

## 2017-11-14 DIAGNOSIS — R109 Unspecified abdominal pain: Secondary | ICD-10-CM | POA: Diagnosis present

## 2017-11-14 DIAGNOSIS — C8108 Nodular lymphocyte predominant Hodgkin lymphoma, lymph nodes of multiple sites: Secondary | ICD-10-CM | POA: Insufficient documentation

## 2017-11-14 MED ORDER — IOPAMIDOL (ISOVUE-300) INJECTION 61%
100.0000 mL | Freq: Once | INTRAVENOUS | Status: AC | PRN
  Administered 2017-11-14: 100 mL via INTRAVENOUS

## 2017-11-14 MED ORDER — IOPAMIDOL (ISOVUE-300) INJECTION 61%
INTRAVENOUS | Status: AC
  Filled 2017-11-14: qty 100

## 2017-11-14 NOTE — Telephone Encounter (Signed)
Pt called to advise that she had her CT.  Needed to schedule Lab/MD visit with [name].  Sent scheduling message to have completed and patient notified.

## 2017-11-19 ENCOUNTER — Telehealth: Payer: Self-pay

## 2017-11-19 ENCOUNTER — Encounter: Payer: Self-pay | Admitting: Hematology

## 2017-11-19 ENCOUNTER — Inpatient Hospital Stay (HOSPITAL_BASED_OUTPATIENT_CLINIC_OR_DEPARTMENT_OTHER): Payer: 59 | Admitting: Hematology

## 2017-11-19 ENCOUNTER — Other Ambulatory Visit: Payer: Self-pay

## 2017-11-19 ENCOUNTER — Inpatient Hospital Stay: Payer: 59

## 2017-11-19 VITALS — BP 108/75 | HR 92 | Temp 98.4°F | Resp 17 | Ht 63.0 in | Wt 241.0 lb

## 2017-11-19 DIAGNOSIS — F419 Anxiety disorder, unspecified: Secondary | ICD-10-CM | POA: Diagnosis not present

## 2017-11-19 DIAGNOSIS — C8108 Nodular lymphocyte predominant Hodgkin lymphoma, lymph nodes of multiple sites: Secondary | ICD-10-CM

## 2017-11-19 DIAGNOSIS — G629 Polyneuropathy, unspecified: Secondary | ICD-10-CM

## 2017-11-19 DIAGNOSIS — R109 Unspecified abdominal pain: Secondary | ICD-10-CM

## 2017-11-19 DIAGNOSIS — R51 Headache: Secondary | ICD-10-CM

## 2017-11-19 LAB — CBC WITH DIFFERENTIAL (CANCER CENTER ONLY)
BASOS ABS: 0 10*3/uL (ref 0.0–0.1)
Basophils Relative: 0 %
EOS ABS: 0.1 10*3/uL (ref 0.0–0.5)
EOS PCT: 1 %
HCT: 43.9 % (ref 34.8–46.6)
Hemoglobin: 14.5 g/dL (ref 11.6–15.9)
LYMPHS PCT: 17 %
Lymphs Abs: 0.7 10*3/uL — ABNORMAL LOW (ref 0.9–3.3)
MCH: 25.8 pg (ref 25.1–34.0)
MCHC: 33 g/dL (ref 31.5–36.0)
MCV: 78.3 fL — AB (ref 79.5–101.0)
Monocytes Absolute: 0.7 10*3/uL (ref 0.1–0.9)
Monocytes Relative: 16 %
Neutro Abs: 2.9 10*3/uL (ref 1.5–6.5)
Neutrophils Relative %: 66 %
PLATELETS: 215 10*3/uL (ref 145–400)
RBC: 5.61 MIL/uL — AB (ref 3.70–5.45)
RDW: 14.3 % (ref 11.2–14.5)
WBC: 4.4 10*3/uL (ref 3.9–10.3)

## 2017-11-19 LAB — CMP (CANCER CENTER ONLY)
ALBUMIN: 4 g/dL (ref 3.5–5.0)
ALT: 15 U/L (ref 0–55)
AST: 20 U/L (ref 5–34)
Alkaline Phosphatase: 50 U/L (ref 40–150)
Anion gap: 10 (ref 3–11)
BUN: 10 mg/dL (ref 7–26)
CHLORIDE: 105 mmol/L (ref 98–109)
CO2: 23 mmol/L (ref 22–29)
CREATININE: 0.97 mg/dL (ref 0.60–1.10)
Calcium: 9.3 mg/dL (ref 8.4–10.4)
GFR, Estimated: >60 mL/min (ref >60)
Glucose, Bld: 80 mg/dL (ref 70–140)
Potassium: 4.2 mmol/L (ref 3.5–5.1)
SODIUM: 138 mmol/L (ref 136–145)
Total Bilirubin: 0.4 mg/dL (ref 0.2–1.2)
Total Protein: 7.3 g/dL (ref 6.4–8.3)

## 2017-11-19 LAB — RETICULOCYTES
RBC.: 5.61 MIL/uL — ABNORMAL HIGH (ref 3.70–5.45)
RETIC COUNT ABSOLUTE: 61.7 10*3/uL (ref 33.7–90.7)
Retic Ct Pct: 1.1 % (ref 0.7–2.1)

## 2017-11-19 LAB — LACTATE DEHYDROGENASE: LDH: 130 U/L (ref 125–245)

## 2017-11-19 LAB — SEDIMENTATION RATE: SED RATE: 3 mm/h (ref 0–22)

## 2017-11-19 NOTE — Telephone Encounter (Signed)
Spoke with patient concerning r/s appointment per 5/24 sch msg

## 2017-11-19 NOTE — Progress Notes (Signed)
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   HISTORY OF PRESENTING ILLNESS:  Bonnie Larsen is a wonderful 37 y.o. female who has been referred to [place] by Dr .Patient, No Pcp Per for evaluation and management of thoracic paraspinal masses and upper abdominal extensive lymphadenopathy.  Patient has a history of uterine fibroids, right breast benign lesion biopsied in 2016, anxiety/panic attacks, migraine headaches and chronic sinus issues who presented to the emergency room brought by EMS for chest pain and shortness of breath about 7-10 days ago. She was noted to be hypertensive with a blood pressure of 180/110 which improved with nitroglycerin. She was also having some back pain and felt like her chest pain was radiating to the back. D-dimer level was borderline elevated. She had a CTA of the chest on 01/06/2017 that showed no evidence of pulmonary embolism. She was however noted to have Multiple prevertebral paraspinal masses, differential diagnosis includes extramedullary hematopoiesis and metastasis/lymphadenopathy. Recommend correlation for laboratory analysis for anemia and lymphoproliferative disease.  Patient was subsequently seen by her primary care physician and had a CT of the abdomen and pelvis with contrast on 01/09/2017 which showed Extensive upper abdominal adenopathy favoring lymphoma/lymphoproliferative disease, tissue diagnosis recommended. No splenomegaly or focal splenic lesion.  She was referred to [place] for further evaluation and management.  She was here with her mother and husband.  She notes no abdominal, or chest trauma. No fevers chills night sweats or unexpected weight loss. Mild constipation but no other change in bowel  habits. No GI bleeding noted. No change in food intake or swallowing. No urinary discomfort or abnormalities. Currently having no chest pain or shortness of breath. Mild upper abdominal discomfort. No other obvious peripheral lymphadenopathy noted.   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 She is accompanied by her mother.   She is doing much better since her last visit. She reports not drinking as much soda as before which has seemed to help. She has been drinking more water. Her pain has drastically decreased. She still has mild abdominal pain, but it is no where near where it was. She denies any issues with bladder or bowel habits.   She continues to be stressed and she is working on trying to de stress herself. She went to behavioral health and they wanted to put her on medication, however she does not want to be put on any more medication. She would prefer to talk to someone. She has joined [employer] and adds she would like to go in the morning before work. Bonnie Larsen adds after work she is too tired and has too much to do around the house to be able to exercise.   Most recent lab results (11/19/17) of CBC and CMP as follows: all labs are WNL except for RBC at 5.61, MCV at 78.3, Lymphs Abs at 0.7.  Since her last visit here she underwent a CT Abdomen, 11/15/2017, showing no evidence of bowel obstruction. Normal appendix. No Ct findings to account for the patient's abdominal pain.   On review of systems, pt  denies fever, chills, weight loss, decreased appetite, decreased energy levels, mouth sores and bilateral lower extremity edema. Denies pain. Pt denies abdominal pain, nausea, vomiting and bladder and bowel changes.     MEDICAL HISTORY:  Past Medical History:  Diagnosis Date  . Anxiety   .  Difficult intravenous access   . Family history of adverse reaction to anesthesia    pt's mother has hx. of post-op N/V and hard to wake up post-op  . History of panic attacks    02/08/17- has not had a panic attacks  . Lymphoma ([place]) 01/2017  . Mesenteric lymphadenopathy 01/2017  . Morbid obesity with BMI of 40.0-44.9, adult ([place])   . Runny nose 02/19/2017   clear drainage, per pt.  . Seasonal allergies     SURGICAL HISTORY: Past Surgical History:  Procedure Laterality Date  . LYMPH NODE BIOPSY N/A 02/11/2017   Procedure: HAND ASSISTED LAPAROSCOPIC LYMPH NODE BIOPSY;  Surgeon: Stark Klein, MD;  Location: [facility];  Service: General;  Laterality: N/A;  . PORT-A-CATH REMOVAL N/A 09/04/2017   Procedure: REMOVAL PORT-A-CATH;  Surgeon: Stark Klein, MD;  Location: [facility];  Service: General;  Laterality: N/A;  . PORTACATH PLACEMENT N/A 02/20/2017   Procedure: INSERTION PORT-A-CATH;  Surgeon: Stark Klein, MD;  Location: [facility];  Service: General;  Laterality: N/A;  . TUBAL LIGATION    . UMBILICAL HERNIA REPAIR  02/11/2017    SOCIAL HISTORY: Social History   Socioeconomic History  . Marital status: Married    Spouse name: Gwyndolyn Saxon  . Number of children: 2  . Years of education: Not on file  . Highest education level: Associate degree: occupational, Hotel manager, or vocational program  Occupational History  . Not on file  Social Needs  . Financial resource strain: Not hard at all  . Food insecurity:    Worry: Never true    Inability: Never true  . Transportation needs:    Medical: No    Non-medical: No  Tobacco Use  . Smoking status: Never Smoker  . Smokeless tobacco: Never Used  Substance and Sexual Activity  . Alcohol use: No    Alcohol/week: 0.0 oz  . Drug use: No  . Sexual activity: Yes    Partners: Male    Birth control/protection: IUD    Comment: tubal ligation  Lifestyle  . Physical activity:    Days per week: 0 days    Minutes per session: 0 min    . Stress: Rather much  Relationships  . Social connections:    Talks on phone: More than three times a week    Gets together: Never    Attends religious service: Never    Active member of club or organization: No    Attends meetings of clubs or organizations: Never    Relationship status: Married  . Intimate partner violence:    Fear of current or ex partner: No    Emotionally abused: No    Physically abused: No    Forced sexual activity: No  Other Topics Concern  . Not on file  Social History Narrative  . Not on file    FAMILY HISTORY: Family History  Problem Relation Age of Onset  . Breast cancer Mother 26  . Fibromyalgia Mother   . Rheum arthritis Mother   . Neuropathy Mother   . Anesthesia problems Mother        post-op N/V; hard to wake up post-op  . Congestive Heart Failure Father  died at age 34  . Cirrhosis Father   . Heart disease Maternal Grandmother   . Diabetes Maternal Grandmother   . Other Brother        Benign spinal tumor  . Cancer Paternal Grandfather        Leukemia    ALLERGIES:  is allergic to clindamycin/lincomycin and cymbalta [duloxetine hcl].  MEDICATIONS:  Current Outpatient Medications  Medication Sig Dispense Refill  . Biotin 10 MG TABS Take by mouth.    . levonorgestrel (MIRENA) 20 MCG/24HR IUD 1 each by Intrauterine route once.    Marland Kitchen LORazepam (ATIVAN) 0.5 MG tablet Take 1 tablet (0.5 mg total) by mouth every 8 (eight) hours as needed for anxiety. 30 tablet 0  . Multiple Vitamin (MULTIVITAMIN) tablet Take 1 tablet by mouth daily.     No current facility-administered medications for this visit.    Facility-Administered Medications Ordered in Other Visits  Medication Dose Route Frequency Provider Last Rate Last Dose  . sodium chloride flush (NS) 0.9 % injection 10 mL  10 mL Intracatheter PRN Brunetta Genera, MD   10 mL at 03/26/17 1724  . sodium chloride flush (NS) 0.9 % injection 10 mL  10 mL Intravenous PRN Brunetta Genera, MD   10 mL at 04/16/17 1938    REVIEW OF SYSTEMS:   .10 Point review of Systems was done is negative except as noted above.   PHYSICAL EXAMINATION:  ECOG PERFORMANCE STATUS: 1 - Symptomatic but completely ambulatory  Vitals:   11/19/17 1458  BP: 108/75  Pulse: 92  Resp: 17  Temp: 98.4 F (36.9 C)  SpO2: 100%   Filed Weights   11/19/17 1458  Weight: 241 lb (109.3 kg)   .Body mass index is 42.69 kg/m. Marland Kitchen GENERAL:alert, in no acute distress and comfortable SKIN: no acute rashes, no significant lesions EYES: conjunctiva are pink and non-injected, sclera anicteric OROPHARYNX: MMM, no exudates, no oropharyngeal erythema or ulceration NECK: supple, no JVD LYMPH:  no palpable lymphadenopathy in the cervical, axillary or inguinal regions LUNGS: clear to auscultation b/l with normal respiratory effort HEART: regular rate & rhythm ABDOMEN:  normoactive bowel sounds , non tender, not distended. Extremity: no pedal edema PSYCH: alert & oriented x 3 with fluent speech NEURO: no focal motor/sensory deficits  LABORATORY DATA:  I have reviewed the data as listed   . CBC Latest Ref Rng & Units 11/19/2017 11/01/2017 09/04/2017  WBC 3.9 - 10.3 K/uL 4.4 3.4(L) 3.6(L)  Hemoglobin 11.6 - 15.9 g/dL 14.5 14.3 14.1  Hematocrit 34.8 - 46.6 % 43.9 43.7 43.4  Platelets 145 - 400 K/uL 215 216 218     . CMP Latest Ref Rng & Units 11/19/2017 11/06/2017 11/01/2017  Glucose 70 - 140 mg/dL 80 84 98  BUN 7 - 26 mg/dL 10 12 9   Creatinine 0.60 - 1.10 mg/dL 0.97 1.09 0.95  Sodium 136 - 145 mmol/L 138 139 139  Potassium 3.5 - 5.1 mmol/L 4.2 4.5 4.3  Chloride 98 - 109 mmol/L 105 104 104  CO2 22 - 29 mmol/L 23 29 28   Calcium 8.4 - 10.4 mg/dL 9.3 9.6 9.5  Total Protein 6.4 - 8.3 g/dL 7.3 6.9 7.0  Total Bilirubin 0.2 - 1.2 mg/dL 0.4 0.3 0.3  Alkaline Phos 40 - 150 U/L 50 58 50  AST 5 - 34 U/L 20 17 22   ALT 0 - 55 U/L 15 15 20    Component     Latest Ref Rng & Units 01/14/2017  LDH     125  - 245 U/L 193  HIV Screen 4th Generation wRfx     Non Reactive Non Reactive  Hep C Virus Ab     0.0 - 0.9 s/co ratio <0.1  hCG,Beta Subunit,Qual,Serum     Negative <6 mIU/mL Negative  Sed Rate     0 - 32 mm/hr 7   Lymphnode biopsy, 02/11/2017 Diagnosis Lymph node for lymphoma, Mesenteric Lymphadenopathy - HODGKIN LYMPHOMA.         PROCEDURES  ECHO 03/13/17 Study Conclusions - Left ventricle: The cavity size was normal. Systolic function was   normal. The estimated ejection fraction was in the range of 55%   to 60%. Wall motion was normal; there were no regional wall   motion abnormalities. Left ventricular diastolic function   parameters were normal. - Atrial septum: No defect or patent foramen ovale was identified. - Impressions: Normal GLS -20.2.   RADIOGRAPHIC STUDIES: I have personally reviewed the radiological images as listed and agreed with the findings in the report. Ct Abdomen Pelvis W Contrast  Result Date: 11/15/2017 CLINICAL DATA:  Abdominal pain, history of Hodgkin's lymphoma in remission EXAM: CT ABDOMEN AND PELVIS WITH CONTRAST TECHNIQUE: Multidetector CT imaging of the abdomen and pelvis was performed using the standard protocol following bolus administration of intravenous contrast. CONTRAST:  166mL ISOVUE-300 IOPAMIDOL (ISOVUE-300) INJECTION 61% COMPARISON:  PET-CT dated 08/06/2017 FINDINGS: Lower chest: Lung bases are clear. Hepatobiliary: Liver is within normal limits. Gallbladder is unremarkable. No intrahepatic or extrahepatic ductal dilatation. Pancreas: Within normal limits. Spleen: Spleen is normal in size. Adrenals/Urinary Tract: Adrenal glands are within normal limits. Malrotated right kidney. Left kidney is within normal limits. No hydronephrosis. Bladder is mildly thick-walled although underdistended. Stomach/Bowel: Stomach is within normal limits. No evidence of bowel obstruction. Normal appendix (series 2/image 48). Vascular/Lymphatic: No evidence  of abdominal aortic aneurysm. Small upper abdominal and para-aortic lymph nodes measuring up to 10 mm short axis, unchanged from PET-CT. Jejunal mesentery nodes measuring up to 12 mm short axis, similar. These were non FDG avid on prior PET. Reproductive: Uterus is notable for IUD in satisfactory position. Left ovary is notable for a 2.7 cm follicle, physiologic. Right ovary is unremarkable. Other: No abdominopelvic ascites. Musculoskeletal: Mild degenerative changes of the lower thoracic spine. IMPRESSION: No evidence of bowel obstruction.  Normal appendix. No CT findings to account for the patient's abdominal pain. Small abdominopelvic nodes, as described above, unchanged and non FDG avid on recent PET-CT. Electronically Signed   By: Julian Hy M.D.   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 -PET/CT showed fairly active disease. Abdominal disease if progression occurs could potentially cause organ injury. -LDH level and sedimentation rate within normal limits . -HIV negative  -Hepatitis C negative.  -Baseline ECHO from 03/13/17 shows normal heart function, EF 55%-60%  Rpt PET/CT on 05/29/2017 - showed Significant reduction in size and metabolic activity of the formerly extensive adenopathy in the chest and abdomen, with primarily Deauville 3 activity today, and some Deauville 2 activity. Previously this was Deauville 5.  S/p R-CHOP x 6 cycles  PET/CT 08/06/2017: No evidence active lymphoma with minimal activity within the remaining periaortic and mesenteric lymph nodes - Deauville 2  2) Abdominal pain-  Lower abd. UA/UCx - no evidence of UTI Plan  -Labs reviewed  with pt -CT abd/pelvis done to evaluate abdominal pain and shows no evidence of overt new pathology or lymphoma recurrence. -patient notes abdominal pain has now resolved. -continue scheduled surveillance for her HL. No other intervention recommended at this time.   3) Patient Active Problem List   Diagnosis Date Noted  . Migraine 09/03/2017  . Counseling regarding advanced care planning and goals of care 06/24/2017  . Nodular lymphocyte predominant Hodgkin lymphoma of lymph nodes of multiple regions ([place]) 03/19/2017  . Mesenteric lymphadenopathy 02/11/2017  . Adenopathy 01/10/2017  . 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 Abnormal chest CT 01/07/2017  . History of panic attacks   . Bacterial pharyngitis 07/30/2016  . 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 -continue taking Vitamin B complex with meals to assist with this.   -Mild progression with return of numbness to ball of feet and still has leg pain, will monitor.   6) Headaches ? Stress headache vs Migraines  PLAN  -Migraines are likely stressed induced. If still progresses I recommend she see her PCP  -I highly recommend she be more active to help de-istress and help with body aches.    RTC with Dr [name] in 3 months with labs  All of the patients questions were answered with apparent satisfaction. The patient knows to call the clinic with any problems, questions or concerns.  . The total time spent in the appointment was 15 minutes and more than 50% was on counseling and direct patient cares.    Sullivan Lone MD [place] AAHIVMS [facility] Hematology/Oncology Physician [facility]  (Office):       [phone] (Work cell):  [phone] (Fax):           [phone]  This document serves as a record of services personally performed by  Sullivan Lone, MD. It was created on his behalf by Margit Banda, a trained medical scribe. The creation of this record is based on the scribe's personal observations and the provider's statements to them.   .I have reviewed the above documentation for accuracy and completeness, and I agree with the above. Brunetta Genera MD

## 2017-11-21 ENCOUNTER — Telehealth: Payer: Self-pay

## 2017-11-21 NOTE — Telephone Encounter (Signed)
Spoke with patient concerning her upcoming appointment. Per 5/28 los, also mailed a letter, and calender.

## 2017-11-25 ENCOUNTER — Emergency Department (HOSPITAL_COMMUNITY): Payer: 59

## 2017-11-25 ENCOUNTER — Emergency Department (HOSPITAL_COMMUNITY)
Admission: EM | Admit: 2017-11-25 | Discharge: 2017-11-25 | Disposition: A | Discharge status: Home / Self Care | Payer: 59 | Attending: Emergency Medicine | Admitting: Emergency Medicine

## 2017-11-25 ENCOUNTER — Emergency Department (HOSPITAL_BASED_OUTPATIENT_CLINIC_OR_DEPARTMENT_OTHER): Payer: 59

## 2017-11-25 ENCOUNTER — Encounter (HOSPITAL_COMMUNITY): Payer: Self-pay

## 2017-11-25 DIAGNOSIS — M79609 Pain in unspecified limb: Secondary | ICD-10-CM

## 2017-11-25 DIAGNOSIS — M25572 Pain in left ankle and joints of left foot: Secondary | ICD-10-CM | POA: Insufficient documentation

## 2017-11-25 DIAGNOSIS — Z79899 Other long term (current) drug therapy: Secondary | ICD-10-CM | POA: Diagnosis not present

## 2017-11-25 DIAGNOSIS — M79672 Pain in left foot: Secondary | ICD-10-CM

## 2017-11-25 DIAGNOSIS — M25562 Pain in left knee: Secondary | ICD-10-CM | POA: Insufficient documentation

## 2017-11-25 DIAGNOSIS — M79662 Pain in left lower leg: Secondary | ICD-10-CM | POA: Diagnosis present

## 2017-11-25 DIAGNOSIS — C9151 Adult T-cell lymphoma/leukemia (HTLV-1-associated), in remission: Secondary | ICD-10-CM | POA: Insufficient documentation

## 2017-11-25 NOTE — ED Provider Notes (Signed)
[facility] DEPT Provider Note   CSN: [id number] Arrival date & time: 11/25/17  1256     History   Chief Complaint Chief Complaint  Patient presents with  . Foot Pain    HPI Bonnie Larsen is a 37 y.o. female with a history of morbid obesity and Hodgkin's lymphoma in remission 01/19 who presents to the emergency department with a chief complaint of left lower leg pain.  The patient reports pain to the left foot, ankle, and left knee that is progressively worsened over the last week.  She reports constant, worsening pain to the posterior left knee.  She reports she also had pain to the top of her left foot for the last 6 days, that is since moved to the lateral side of her ankle this morning.  Pain in all locations is throbbing.  She reports that when she stands that the pain in her ankle radiates up the left lower leg and at the knee pain radiates down into her calf.  She is also noticed mild swelling to the left ankle. she reports that today she has barely been able to put weight on her left foot due to the pain.  States that she had a "knot" on the top of her left foot when she awoke this morning, which is since resolved.  She denies redness or warmth to the left lower leg or foot.  No known trauma or injury.  No new exercises or activities.  No history of previous left lower extremity surgery or injuries.  She has been applying ice to the pain in her left foot and ankle, but states that the ice actually makes her pain worse.  She denies any fever, chills, numbness, weakness, chest pain, dyspnea, or left hip pain.  She has a history of plantars fasciitis to the left foot, which has been bothering her minimally for the last few days.  She has a history of Hodgkin's lymphoma which has been in remission since January 2019.  She reports that she has been laying around more over the last few weeks due to abdominal pain, which has resolved.  She was seen by her  oncologist for her symptoms and had a repeat CT of the abdomen and pelvis due to concern for recurrent Hodgkin's lymphoma.  However, the CT was negative.  She is on Mirena IUD for birth control.  No recent long travel or surgery.  No history of DVT or PE.  The history is provided by the patient. No language interpreter was used.    Past Medical History:  Diagnosis Date  . Anxiety   . Difficult intravenous access   . Family history of adverse reaction to anesthesia    pt's mother has hx. of post-op N/V and hard to wake up post-op  . History of panic attacks    02/08/17- has not had a panic attacks  . Lymphoma ([place]) 01/2017  . Mesenteric lymphadenopathy 01/2017  . Morbid obesity with BMI of 40.0-44.9, adult ([place])   . Runny nose 02/19/2017   clear drainage, per pt.  . Seasonal allergies     Patient Active Problem List   Diagnosis Date Noted  . Migraine 09/03/2017  . Counseling regarding advanced care planning and goals of care 06/24/2017  . 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 Mesenteric lymphadenopathy 02/11/2017  . Adenopathy 01/10/2017  . 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 Abnormal chest CT 01/07/2017  . History  of panic attacks   . Bacterial pharyngitis 07/30/2016  . Obesity, Class II, BMI 35-39.9, isolated 04/25/2015    Past Surgical History:  Procedure Laterality Date  . LYMPH NODE BIOPSY N/A 02/11/2017   Procedure: HAND ASSISTED LAPAROSCOPIC LYMPH NODE BIOPSY;  Surgeon: Stark Klein, MD;  Location: [facility];  Service: General;  Laterality: N/A;  . PORT-A-CATH REMOVAL N/A 09/04/2017   Procedure: REMOVAL PORT-A-CATH;  Surgeon: Stark Klein, MD;  Location: [facility];  Service: General;  Laterality: N/A;  . PORTACATH PLACEMENT N/A 02/20/2017   Procedure: INSERTION PORT-A-CATH;  Surgeon: Stark Klein, MD;  Location: [facility];  Service: General;  Laterality: N/A;  . TUBAL LIGATION    . UMBILICAL HERNIA  REPAIR  02/11/2017     OB History    Gravida  4   Para  2   Term  2   Preterm      AB  2   Living  2     SAB      TAB      Ectopic      Multiple      Live Births  2            Home Medications    Prior to Admission medications   Medication Sig Start Date End Date Taking? Authorizing Provider  Biotin 10 MG TABS Take by mouth.   Yes [provider]  levonorgestrel (MIRENA) 20 MCG/24HR IUD 1 each by Intrauterine route once.   Yes [provider]  Multiple Vitamin (MULTIVITAMIN) tablet Take 1 tablet by mouth daily.   Yes [provider]    Family History Family History  Problem Relation Age of Onset  . Breast cancer Mother 42  . Fibromyalgia Mother   . Rheum arthritis Mother   . Neuropathy Mother   . Anesthesia problems Mother        post-op N/V; hard to wake up post-op  . Congestive Heart Failure Father        died at age 73  . Cirrhosis Father   . Heart disease Maternal Grandmother   . Diabetes Maternal Grandmother   . Other Brother        Benign spinal tumor  . Cancer Paternal Grandfather        Leukemia    Social History Social History   Tobacco Use  . Smoking status: Never Smoker  . Smokeless tobacco: Never Used  Substance Use Topics  . Alcohol use: No    Alcohol/week: 0.0 oz  . Drug use: No     Allergies   Clindamycin/lincomycin and Cymbalta [duloxetine hcl]   Review of Systems Review of Systems  Constitutional: Negative for activity change, chills and fever.  Respiratory: Negative for shortness of breath.   Cardiovascular: Negative for chest pain.  Gastrointestinal: Negative for abdominal pain.  Genitourinary: Negative for dysuria.  Musculoskeletal: Positive for arthralgias, gait problem, joint swelling and myalgias. Negative for back pain.  Skin: Negative for rash and wound.  Allergic/Immunologic: Negative for immunocompromised state.  Neurological: Negative for dizziness, weakness, numbness and  headaches.  Psychiatric/Behavioral: Negative for confusion.   Physical Exam Updated Vital Signs BP (!) 125/91   Pulse 92   Temp 97.9 F (36.6 C) (Oral)   Resp 16   Ht 5\' 3"  (1.6 m)   Wt 109.3 kg (241 lb)   SpO2 96%   BMI 42.69 kg/m   Physical Exam  Constitutional: No distress.  HENT:  Head: Normocephalic.  Eyes: Conjunctivae are normal.  Neck: Neck supple.  Cardiovascular: Normal rate and regular rhythm. Exam reveals no gallop and no friction rub.  No murmur heard. Pulmonary/Chest: Effort normal. No respiratory distress.  Abdominal: Soft. She exhibits no distension.  Musculoskeletal: She exhibits edema and tenderness. She exhibits no deformity.  Tender to palpation over the left lateral medial malleolus, diffusely.  Mild edema noted over the left lateral malleolus and dorsum of the left foot.  She is minimally tender to the dorsum of the foot over the ankle mortise.  No induration, fluctuance, erythema, warmth, ecchymosis, or mass to the dorsum of the left foot. Reproducible tenderness to palpation to the left posterior knee.  Full active and passive range of motion of the left ankle, knee, and hip.  DP and PT pulses are 2+ and symmetric.  Sensation is intact throughout the bilateral lower extremities.  5 out of 5 strength against resistance with dorsiflexion plantarflexion.  Achilles tendon is intact.  No tenderness to the medial or lateral joint line of the left knee.  Negative anterior and posterior drawer test.  Negative valgus and varus stress test.  No overlying erythema, warmth, ecchymosis, or contusions to the left lower leg or foot.  Neurological: She is alert.  Skin: Skin is warm. No rash noted.  Psychiatric: Her behavior is normal.  Nursing note and vitals reviewed.    ED Treatments / Results  Labs (all labs ordered are listed, but only abnormal results are displayed) Labs Reviewed - No data to display  EKG None  Radiology Dg Ankle Complete Left  Result  Date: 11/25/2017 CLINICAL DATA:  Left foot pain without history of injury. Pain is greatest with weight-bearing. EXAM: LEFT ANKLE COMPLETE - 3+ VIEW COMPARISON:  None in PACs FINDINGS: The ankle joint mortise is preserved. The talar dome is intact. There is no acute malleolar fracture. The talus and calcaneus are intact. The other tarsal bones appear normal as do the metatarsal bases. There is mild diffuse soft tissue swelling. IMPRESSION: Soft tissue swelling diffusely. No acute fracture, dislocation, or significant arthropathy. Electronically Signed   By: David  Martinique M.D.   On: 11/25/2017 13:27   Dg Foot Complete Left  Result Date: 11/25/2017 CLINICAL DATA:  Left foot pain. Palpable knot over the dorsum of the foot. No known injury. EXAM: LEFT FOOT - COMPLETE 3+ VIEW COMPARISON:  Left ankle series of today's date FINDINGS: The bones are subjectively adequately mineralized. There is no acute or healing fracture. The joint spaces are well maintained. There is soft tissue swelling over the dorsum of the forefoot. IMPRESSION: There is no acute or significant chronic bony abnormality of the left foot. Mild soft tissue swelling dorsally over the forefoot. Electronically Signed   By: David  Martinique M.D.   On: 11/25/2017 13:26    Procedures Procedures (including critical care time)  Medications Ordered in ED Medications - No data to display   Initial Impression / Assessment and Plan / ED Course  I have reviewed the triage vital signs and the nursing notes.  Pertinent labs & imaging results that were available during my care of the patient were reviewed by me and considered in my medical decision making (see chart for details).     37 year old female with a history of morbid obesity and Hodgkin's lymphoma in remission 01/19 who presents to the emergency department with a chief complaint of atraumatic left lower leg pain.  She has had left posterior knee pain and pain and swelling to the dorsum of the  left  foot for 1 week; however, pain on the dorsum and swelling of the foot moved to the lateral ankle today.  She noted a "knot" which she awoke this morning, which is since resolved.  I did not note any masses to the dorsum of the foot on my exam.  No recent falls, injuries, new exercises, or activities.  No history of similar.  Given recent history of Hodgkin's lymphoma within the last 6 months and decreased activity over the last 2 weeks, DVT study of the left lower extremity ordered.  Study is negative.  Left ankle and foot x-rays ordered by nursing triage staff demonstrating mild soft tissue swelling, but are otherwise unremarkable.  The patient does have a history of plantars fasciitis.  It is possible that with her having pain in the plantar surface of the foot that this has somewhat changed her gait over the last week and could be causing her pain in the left foot and knee, coupled with her body habitus.  Doubt gout, septic joint, bony lesion, or DVT.  Will provide crutches, Ace wrap for compression, and postop shoe until swelling improves.  Will treat as a musculoskeletal injury and have her follow-up with podiatry if her symptoms do not improve in the next week.  Strict return precautions given.  She is hemodynamically stable in no acute distress.  She is safe for discharge home at this time.  Final Clinical Impressions(s) / ED Diagnoses   Final diagnoses:  Acute left ankle pain  Posterior knee pain, left  Left foot pain    ED Discharge Orders    None       Joanne Gavel, PA-C 11/25/17 1756    Hayden Rasmussen, MD 11/26/17 [phone]

## 2017-11-25 NOTE — ED Triage Notes (Signed)
Pt presents with c/o left foot pain. Pt reports that she has not fallen, but that she is noticing a knot on the top of her left foot. Pt reports the pain is a 10/10 when she puts any weight on it.

## 2017-11-25 NOTE — Progress Notes (Signed)
Preliminary notes--Left lower extremity venous duplex exam completed. Negative for DVT.   Trace fluid collection seen at popliteal fossa.  Limited study due to patient body habitus.    Hongying Landry Mellow (RDMS RVT) 11/25/17 4:39 PM

## 2017-11-25 NOTE — Discharge Instructions (Signed)
Thank you for allowing me to provide your care today in the emergency department.  Your ultrasound was negative for a blood clot in the leg and did not show a cyst in the knee that could be causing your pain.  Your x-rays did not show any broken bones, dislocated bones, or anything else that could be causing your symptoms today.  For the next few days, use the crutches until you can put weight on your left foot without a significant amount of pain.  You can use the Ace wrap on the left foot and ankle to provide compression to help with pain and swelling.  The postop shoe has Velcro see you can use it instead of wearing your tennis shoes until swelling improves.   Take 650 mg of Tylenol or 600 mg of ibuprofen with food every 6 hours for pain control.  Apply ice for 15 to 20 minutes up to 3-4 times a day to any areas that are sore.  When you are sitting and resting, elevate your left leg so that your toes are at the level of your nose.  If your symptoms do not start to improve with this regimen in the next week, you can call and schedule a follow-up appointment with Dr. Amalia Hailey, podiatry.  Return to the emergency department if you develop fever, chills, if the area gets red and hot to the touch, or if you have any fall or injury.

## 2018-02-19 NOTE — Progress Notes (Signed)
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   HISTORY OF PRESENTING ILLNESS:  Bonnie Larsen is a wonderful 37 y.o. female who has been referred to [place] by Dr .Patient, No Pcp Per for evaluation and management of thoracic paraspinal masses and upper abdominal extensive lymphadenopathy.  Patient has a history of uterine fibroids, right breast benign lesion biopsied in 2016, anxiety/panic attacks, migraine headaches and chronic sinus issues who presented to the emergency room brought by EMS for chest pain and shortness of breath about 7-10 days ago. She was noted to be hypertensive with a blood pressure of 180/110 which improved with nitroglycerin. She was also having some back pain and felt like her chest pain was radiating to the back. D-dimer level was borderline elevated. She had a CTA of the chest on 01/06/2017 that showed no evidence of pulmonary embolism. She was however noted to have Multiple prevertebral paraspinal masses, differential diagnosis includes extramedullary hematopoiesis and metastasis/lymphadenopathy. Recommend correlation for laboratory analysis for anemia and lymphoproliferative disease.  Patient was subsequently seen by her primary care physician and had a CT of the abdomen and pelvis with contrast on 01/09/2017 which showed Extensive upper abdominal adenopathy favoring lymphoma/lymphoproliferative disease, tissue diagnosis recommended. No splenomegaly or focal splenic lesion.  She was referred to [place] for further evaluation and management.  She was here with her mother and husband.  She notes no abdominal, or chest trauma. No fevers chills night sweats or unexpected weight loss. Mild constipation but no other change in bowel  habits. No GI bleeding noted. No change in food intake or swallowing. No urinary discomfort or abnormalities. Currently having no chest pain or shortness of breath. Mild upper abdominal discomfort. No other obvious peripheral lymphadenopathy noted.   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 The patient's last visit with [place] was on 11/19/17. She is accompanied today by her mother. The pt reports that she is doing well overall.   The pt reports that she has felt more tired recently. She began [employer] a week ago. She notes that she is on Mirena which has successfully kept her periods at [place]. She notes that her weight has been stable. She has kept busy with work. The pt notes that she has not developed any new concerns.  The pt's mom notes that she feels that the pt may be depressed. The pt notes that she has had some inability to concentrate, and is not sure why she "just feels tired". She previously saw a psychiatrist who put her on Cymbalta, and the pt stopped taking it as she develop burning skin sensations. She notes that she has not seen a Social worker. She notes that some days she feels that she is in a "funk" but that other days are great.   Lab results today (02/20/18) of CBC w/diff, CMP is as follows: all values are WNL except for RBC at 5.53, MCV at 79.4, Lymphs abs at 800, Creatinine at 1.04. 02/20/18 LDH is  Lab Results  Component Value Date   LDH 117 02/20/2018   02/20/18 Sed Rate is pending  On review of systems,  pt reports stable weight, some tiredness, and denies abdominal pains, periods, fevers, chills, night sweats, unexpected weight loss, noticing any new lumps or bumps, pain along the spine, neck pains, abdominal pains, leg swelling, and any other symptoms.    MEDICAL HISTORY:  Past Medical History:  Diagnosis Date    . Anxiety   . Difficult intravenous access   . Family history of adverse reaction to anesthesia    pt's mother has hx. of post-op N/V and hard to wake up post-op  . History of panic attacks    02/08/17- has not had a panic attacks  . Lymphoma ([place]) 01/2017  . Mesenteric lymphadenopathy 01/2017  . Morbid obesity with BMI of 40.0-44.9, adult ([place])   . Runny nose 02/19/2017   clear drainage, per pt.  . Seasonal allergies     SURGICAL HISTORY: Past Surgical History:  Procedure Laterality Date  . LYMPH NODE BIOPSY N/A 02/11/2017   Procedure: HAND ASSISTED LAPAROSCOPIC LYMPH NODE BIOPSY;  Surgeon: Stark Klein, MD;  Location: [facility];  Service: General;  Laterality: N/A;  . PORT-A-CATH REMOVAL N/A 09/04/2017   Procedure: REMOVAL PORT-A-CATH;  Surgeon: Stark Klein, MD;  Location: [facility];  Service: General;  Laterality: N/A;  . PORTACATH PLACEMENT N/A 02/20/2017   Procedure: INSERTION PORT-A-CATH;  Surgeon: Stark Klein, MD;  Location: [facility];  Service: General;  Laterality: N/A;  . TUBAL LIGATION    . UMBILICAL HERNIA REPAIR  02/11/2017    SOCIAL HISTORY: Social History   Socioeconomic History  . Marital status: Married    Spouse name: Gwyndolyn Saxon  . Number of children: 2  . Years of education: Not on file  . Highest education level: Associate degree: occupational, Hotel manager, or vocational program  Occupational History  . Not on file  Social Needs  . Financial resource strain: Not hard at all  . Food insecurity:    Worry: Never true    Inability: Never true  . Transportation needs:    Medical: No    Non-medical: No  Tobacco Use  . Smoking status: Never Smoker  . Smokeless tobacco: Never Used  Substance and Sexual Activity  . Alcohol use: No    Alcohol/week: 0.0 standard drinks  . Drug use: No  . Sexual activity: Yes    Partners: Male    Birth control/protection: IUD    Comment: tubal ligation  Lifestyle  . Physical activity:    Days per week: 0 days     Minutes per session: 0 min  . Stress: Rather much  Relationships  . Social connections:    Talks on phone: More than three times a week    Gets together: Never    Attends religious service: Never    Active member of club or organization: No    Attends meetings of clubs or organizations: Never    Relationship status: Married  . Intimate partner violence:    Fear of current or ex partner: No    Emotionally abused: No    Physically abused: No    Forced sexual activity: No  Other Topics Concern  . Not on file  Social History Narrative  . Not on file    FAMILY HISTORY: Family History  Problem Relation Age of Onset  . Breast cancer Mother 7  . Fibromyalgia Mother   . Rheum arthritis Mother   . Neuropathy Mother   . Anesthesia problems Mother        post-op N/V; hard to wake up post-op  .  Congestive Heart Failure Father        died at age 37  . Cirrhosis Father   . Heart disease Maternal Grandmother   . Diabetes Maternal Grandmother   . Other Brother        Benign spinal tumor  . Cancer Paternal Grandfather        Leukemia    ALLERGIES:  is allergic to clindamycin/lincomycin and cymbalta [duloxetine hcl].  MEDICATIONS:  Current Outpatient Medications  Medication Sig Dispense Refill  . Biotin 10 MG TABS Take by mouth.    . levonorgestrel (MIRENA) 20 MCG/24HR IUD 1 each by Intrauterine route once.    . Multiple Vitamin (MULTIVITAMIN) tablet Take 1 tablet by mouth daily.     No current facility-administered medications for this visit.    Facility-Administered Medications Ordered in Other Visits  Medication Dose Route Frequency Provider Last Rate Last Dose  . sodium chloride flush (NS) 0.9 % injection 10 mL  10 mL Intracatheter PRN Brunetta Genera, MD   10 mL at 03/26/17 1724  . sodium chloride flush (NS) 0.9 % injection 10 mL  10 mL Intravenous PRN Brunetta Genera, MD   10 mL at 04/16/17 1938    REVIEW OF SYSTEMS:   A 10+ POINT REVIEW OF SYSTEMS WAS  OBTAINED including neurology, dermatology, psychiatry, cardiac, respiratory, lymph, extremities, GI, GU, Musculoskeletal, constitutional, breasts, reproductive, HEENT.  All pertinent positives are noted in the HPI.  All others are negative.   PHYSICAL EXAMINATION:  ECOG PERFORMANCE STATUS: 1 - Symptomatic but completely ambulatory  Vitals:   02/20/18 1351  BP: 122/82  Pulse: 82  Resp: 18  Temp: 98.3 F (36.8 C)  SpO2: 98%   Filed Weights   02/20/18 1351  Weight: 244 lb 12.8 oz (111 kg)   .Body mass index is 43.36 kg/m.  GENERAL:alert, in no acute distress and comfortable SKIN: no acute rashes, no significant lesions EYES: conjunctiva are pink and non-injected, sclera anicteric OROPHARYNX: MMM, no exudates, no oropharyngeal erythema or ulceration NECK: supple, no JVD LYMPH:  no palpable lymphadenopathy in the cervical, axillary or inguinal regions LUNGS: clear to auscultation b/l with normal respiratory effort HEART: regular rate & rhythm ABDOMEN:  normoactive bowel sounds , non tender, not distended. No palpable hepatosplenomegaly.  Extremity: no pedal edema PSYCH: alert & oriented x 3 with fluent speech NEURO: no focal motor/sensory deficits   LABORATORY DATA:  I have reviewed the data as listed   . CBC Latest Ref Rng & Units 02/20/2018 11/19/2017 11/01/2017  WBC 3.9 - 10.3 K/uL 5.1 4.4 3.4(L)  Hemoglobin 11.6 - 15.9 g/dL 14.4 14.5 14.3  Hematocrit 34.8 - 46.6 % 43.9 43.9 43.7  Platelets 145 - 400 K/uL 253 215 216     . CMP Latest Ref Rng & Units 02/20/2018 11/19/2017 11/06/2017  Glucose 70 - 99 mg/dL 99 80 84  BUN 6 - 20 mg/dL 9 10 12   Creatinine 0.44 - 1.00 mg/dL 1.04(H) 0.97 1.09  Sodium 135 - 145 mmol/L 142 138 139  Potassium 3.5 - 5.1 mmol/L 4.2 4.2 4.5  Chloride 98 - 111 mmol/L 105 105 104  CO2 22 - 32 mmol/L 27 23 29   Calcium 8.9 - 10.3 mg/dL 9.7 9.3 9.6  Total Protein 6.5 - 8.1 g/dL 7.0 7.3 6.9  Total Bilirubin 0.3 - 1.2 mg/dL 0.3 0.4 0.3  Alkaline Phos  38 - 126 U/L 60 50 58  AST 15 - 41 U/L 15 20 17   ALT 0 - 44 U/L  9 15 15    Component     Latest Ref Rng & Units 01/14/2017  LDH     125 - 245 U/L 193  HIV Screen 4th Generation wRfx     Non Reactive Non Reactive  Hep C Virus Ab     0.0 - 0.9 s/co ratio <0.1  hCG,Beta Subunit,Qual,Serum     Negative <6 mIU/mL Negative  Sed Rate     0 - 32 mm/hr 7   Lymphnode biopsy, 02/11/2017 Diagnosis Lymph node for lymphoma, Mesenteric Lymphadenopathy - HODGKIN LYMPHOMA.         PROCEDURES  ECHO 03/13/17 Study Conclusions - Left ventricle: The cavity size was normal. Systolic function was  normal. The estimated ejection fraction was in the range of 55%  to 60%. Wall motion was normal; there were no regional wall  motion abnormalities. Left ventricular diastolic function  parameters were normal. - Atrial septum: No defect or patent foramen ovale was identified. - Impressions: Normal GLS -20.2.   RADIOGRAPHIC STUDIES: I have personally reviewed the radiological images as listed and agreed with the findings in the report. No results found.    ASSESSMENT & PLAN:   37 y.o. 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 -PET/CT showed fairly active disease. Abdominal disease if progression occurs could potentially cause organ injury. -LDH level and sedimentation rate within normal limits . -HIV negative  -Hepatitis C negative.  -Baseline ECHO from 03/13/17 shows normal heart function, EF 55%-60%  Rpt PET/CT on 05/29/2017 - showed Significant reduction in size and metabolic activity of the formerly extensive adenopathy in the chest and abdomen, with primarily Deauville 3 activity today, and some Deauville 2 activity. Previously this  was Deauville 5.  S/p R-CHOP x 6 cycles  PET/CT 08/06/2017: No evidence active lymphoma with minimal activity within the remaining periaortic and mesenteric lymph nodes - Deauville 2  PLAN:  -Discussed pt labwork today, 02/20/18; blood counts and chemistries are stable. LDH and Sed Rate are pending.  -Recommended that the pt do her best to stay physically active, eat well, and stay hydrated -The pt shows no clinical or lab progression of Nodular Hodgkin's Lymphoma at this time.  -No indication for further treatment at this time. -Will see the pt back in 3 months, sooner if any new concerns    3) Patient Active Problem List   Diagnosis Date Noted  . Migraine 09/03/2017  . Counseling regarding advanced care planning and goals of care 06/24/2017  . Nodular lymphocyte predominant Hodgkin lymphoma of lymph nodes of multiple regions ([place]) 03/19/2017  . Mesenteric lymphadenopathy 02/11/2017  . Adenopathy 01/10/2017  . 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 Abnormal chest CT 01/07/2017  . History of panic attacks   . Bacterial pharyngitis 07/30/2016  . 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 -continue taking Vitamin B complex with meals to assist with this.   -Mild progression with return of numbness to ball of feet and still has leg pain, will monitor.   6) Headaches ? Stress headache vs Migraines  PLAN -Migraines are likely stressed induced. If still progresses I recommend she see her PCP  -I highly  recommend she be more active to help de-stress and help with body aches.    RTC with Dr [name] in 3 months with labs   All of the patients questions were answered with apparent satisfaction. The patient knows to call the clinic with any problems, questions or concerns.  The total time spent in the appt was 25  minutes and more than 50% was on counseling and direct patient cares.    Sullivan Lone MD MS AAHIVMS [facility] Hematology/Oncology Physician [facility]  (Office):       [phone] (Work cell):  [phone] (Fax):           [phone]  I, Baldwin Jamaica, am acting as a scribe for Dr. [name]  .I have reviewed the above documentation for accuracy and completeness, and I agree with the above. Brunetta Genera MD

## 2018-02-20 ENCOUNTER — Encounter: Payer: Self-pay | Admitting: Hematology

## 2018-02-20 ENCOUNTER — Inpatient Hospital Stay: Payer: 59

## 2018-02-20 ENCOUNTER — Inpatient Hospital Stay: Payer: 59 | Attending: Hematology | Admitting: Hematology

## 2018-02-20 VITALS — BP 122/82 | HR 82 | Temp 98.3°F | Resp 18 | Ht 63.0 in | Wt 244.8 lb

## 2018-02-20 DIAGNOSIS — C8100 Nodular lymphocyte predominant Hodgkin lymphoma, unspecified site: Secondary | ICD-10-CM | POA: Insufficient documentation

## 2018-02-20 DIAGNOSIS — K5900 Constipation, unspecified: Secondary | ICD-10-CM | POA: Diagnosis not present

## 2018-02-20 DIAGNOSIS — I10 Essential (primary) hypertension: Secondary | ICD-10-CM | POA: Insufficient documentation

## 2018-02-20 DIAGNOSIS — C8108 Nodular lymphocyte predominant Hodgkin lymphoma, lymph nodes of multiple sites: Secondary | ICD-10-CM | POA: Diagnosis not present

## 2018-02-20 DIAGNOSIS — G629 Polyneuropathy, unspecified: Secondary | ICD-10-CM

## 2018-02-20 DIAGNOSIS — F419 Anxiety disorder, unspecified: Secondary | ICD-10-CM | POA: Diagnosis not present

## 2018-02-20 LAB — CMP (CANCER CENTER ONLY)
ALT: 9 U/L (ref 0–44)
ANION GAP: 10 (ref 5–15)
AST: 15 U/L (ref 15–41)
Albumin: 3.7 g/dL (ref 3.5–5.0)
Alkaline Phosphatase: 60 U/L (ref 38–126)
BILIRUBIN TOTAL: 0.3 mg/dL (ref 0.3–1.2)
BUN: 9 mg/dL (ref 6–20)
CO2: 27 mmol/L (ref 22–32)
Calcium: 9.7 mg/dL (ref 8.9–10.3)
Chloride: 105 mmol/L (ref 98–111)
Creatinine: 1.04 mg/dL — ABNORMAL HIGH (ref 0.44–1.00)
GFR, Est AFR Am: >60 mL/min (ref >60)
Glucose, Bld: 99 mg/dL (ref 70–99)
POTASSIUM: 4.2 mmol/L (ref 3.5–5.1)
Sodium: 142 mmol/L (ref 135–145)
TOTAL PROTEIN: 7 g/dL (ref 6.5–8.1)

## 2018-02-20 LAB — CBC WITH DIFFERENTIAL/PLATELET
BASOS ABS: 0 10*3/uL (ref 0.0–0.1)
Basophils Relative: 0 %
EOS ABS: 0.1 10*3/uL (ref 0.0–0.5)
EOS PCT: 2 %
HCT: 43.9 % (ref 34.8–46.6)
Hemoglobin: 14.4 g/dL (ref 11.6–15.9)
LYMPHS PCT: 16 %
Lymphs Abs: 0.8 10*3/uL — ABNORMAL LOW (ref 0.9–3.3)
MCH: 26 pg (ref 25.1–34.0)
MCHC: 32.8 g/dL (ref 31.5–36.0)
MCV: 79.4 fL — AB (ref 79.5–101.0)
MONO ABS: 0.5 10*3/uL (ref 0.1–0.9)
Monocytes Relative: 11 %
Neutro Abs: 3.7 10*3/uL (ref 1.5–6.5)
Neutrophils Relative %: 71 %
PLATELETS: 253 10*3/uL (ref 145–400)
RBC: 5.53 MIL/uL — ABNORMAL HIGH (ref 3.70–5.45)
RDW: 13.9 % (ref 11.2–14.5)
WBC: 5.1 10*3/uL (ref 3.9–10.3)

## 2018-02-20 LAB — SEDIMENTATION RATE: SED RATE: 7 mm/h (ref 0–22)

## 2018-02-20 LAB — LACTATE DEHYDROGENASE: LDH: 117 U/L (ref 98–192)

## 2018-02-21 ENCOUNTER — Telehealth: Payer: Self-pay

## 2018-02-21 NOTE — Telephone Encounter (Signed)
Left a detailed voice message concerning the patient change of appointment. Per 8/30 phone msg returns

## 2018-05-23 ENCOUNTER — Inpatient Hospital Stay: Payer: 59

## 2018-05-23 ENCOUNTER — Inpatient Hospital Stay: Payer: 59 | Attending: Hematology | Admitting: Hematology

## 2018-05-23 VITALS — BP 111/75 | HR 93 | Temp 98.3°F | Resp 18 | Ht 63.0 in | Wt 245.2 lb

## 2018-05-23 DIAGNOSIS — F329 Major depressive disorder, single episode, unspecified: Secondary | ICD-10-CM | POA: Diagnosis not present

## 2018-05-23 DIAGNOSIS — I10 Essential (primary) hypertension: Secondary | ICD-10-CM | POA: Diagnosis not present

## 2018-05-23 DIAGNOSIS — G629 Polyneuropathy, unspecified: Secondary | ICD-10-CM | POA: Insufficient documentation

## 2018-05-23 DIAGNOSIS — C8108 Nodular lymphocyte predominant Hodgkin lymphoma, lymph nodes of multiple sites: Secondary | ICD-10-CM

## 2018-05-23 DIAGNOSIS — F419 Anxiety disorder, unspecified: Secondary | ICD-10-CM | POA: Insufficient documentation

## 2018-05-23 LAB — CMP (CANCER CENTER ONLY)
ALT: 13 U/L (ref 0–44)
ANION GAP: 10 (ref 5–15)
AST: 17 U/L (ref 15–41)
Albumin: 4 g/dL (ref 3.5–5.0)
Alkaline Phosphatase: 62 U/L (ref 38–126)
BILIRUBIN TOTAL: 0.5 mg/dL (ref 0.3–1.2)
BUN: 10 mg/dL (ref 6–20)
CHLORIDE: 105 mmol/L (ref 98–111)
CO2: 26 mmol/L (ref 22–32)
Calcium: 9.7 mg/dL (ref 8.9–10.3)
Creatinine: 0.94 mg/dL (ref 0.44–1.00)
Glucose, Bld: 100 mg/dL — ABNORMAL HIGH (ref 70–99)
POTASSIUM: 4.1 mmol/L (ref 3.5–5.1)
Sodium: 141 mmol/L (ref 135–145)
TOTAL PROTEIN: 7.3 g/dL (ref 6.5–8.1)

## 2018-05-23 LAB — SEDIMENTATION RATE: SED RATE: 2 mm/h (ref 0–22)

## 2018-05-23 LAB — CBC WITH DIFFERENTIAL/PLATELET
Abs Immature Granulocytes: 0.01 10*3/uL (ref 0.00–0.07)
BASOS PCT: 1 %
Basophils Absolute: 0 10*3/uL (ref 0.0–0.1)
EOS ABS: 0.1 10*3/uL (ref 0.0–0.5)
EOS PCT: 2 %
HCT: 47 % — ABNORMAL HIGH (ref 36.0–46.0)
Hemoglobin: 15.3 g/dL — ABNORMAL HIGH (ref 12.0–15.0)
Immature Granulocytes: 0 %
Lymphocytes Relative: 20 %
Lymphs Abs: 0.9 10*3/uL (ref 0.7–4.0)
MCH: 25.5 pg — AB (ref 26.0–34.0)
MCHC: 32.6 g/dL (ref 30.0–36.0)
MCV: 78.5 fL — ABNORMAL LOW (ref 80.0–100.0)
MONO ABS: 0.4 10*3/uL (ref 0.1–1.0)
Monocytes Relative: 9 %
NEUTROS ABS: 2.9 10*3/uL (ref 1.7–7.7)
Neutrophils Relative %: 68 %
PLATELETS: 228 10*3/uL (ref 150–400)
RBC: 5.99 MIL/uL — AB (ref 3.87–5.11)
RDW: 13.3 % (ref 11.5–15.5)
WBC: 4.3 10*3/uL (ref 4.0–10.5)
nRBC: 0 % (ref 0.0–0.2)

## 2018-05-23 LAB — LACTATE DEHYDROGENASE: LDH: 134 U/L (ref 98–192)

## 2018-05-26 ENCOUNTER — Telehealth: Payer: Self-pay

## 2018-05-26 NOTE — Telephone Encounter (Signed)
Mailed letter with calender enclosed. Per 11/29 los

## 2018-05-29 NOTE — Progress Notes (Signed)
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   HISTORY OF PRESENTING ILLNESS:  Bonnie Larsen is a wonderful 37 y.o. female who has been referred to [place] by Dr .Patient, No Pcp Per for evaluation and management of thoracic paraspinal masses and upper abdominal extensive lymphadenopathy.  Patient has a history of uterine fibroids, right breast benign lesion biopsied in 2016, anxiety/panic attacks, migraine headaches and chronic sinus issues who presented to the emergency room brought by EMS for chest pain and shortness of breath about 7-10 days ago. She was noted to be hypertensive with a blood pressure of 180/110 which improved with nitroglycerin. She was also having some back pain and felt like her chest pain was radiating to the back. D-dimer level was borderline elevated. She had a CTA of the chest on 01/06/2017 that showed no evidence of pulmonary embolism. She was however noted to have Multiple prevertebral paraspinal masses, differential diagnosis includes extramedullary hematopoiesis and metastasis/lymphadenopathy. Recommend correlation for laboratory analysis for anemia and lymphoproliferative disease.  Patient was subsequently seen by her primary care physician and had a CT of the abdomen and pelvis with contrast on 01/09/2017 which showed Extensive upper abdominal adenopathy favoring lymphoma/lymphoproliferative disease, tissue diagnosis recommended. No splenomegaly or focal splenic lesion.  She was referred to [place] for further evaluation and management.  She was here with her mother and husband.  She notes no abdominal, or chest trauma. No fevers chills night sweats or unexpected weight loss. Mild constipation but no other change in bowel habits.  No GI bleeding noted. No change in food intake or swallowing. No urinary discomfort or abnormalities. Currently having no chest pain or shortness of breath. Mild upper abdominal discomfort. No other obvious peripheral lymphadenopathy noted.   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  She notes no acute new concerns. Anxiety/depression improved - underwent counselling.  On review of systems, pt reports stable weight, some tiredness, and denies abdominal pains, periods, fevers, chills, night sweats, unexpected weight loss, noticing any new lumps or bumps, pain along the spine, neck pains, abdominal pains, leg swelling, and any other symptoms.    MEDICAL HISTORY:  Past Medical History:  Diagnosis Date  . Anxiety   . Difficult intravenous access   . Family history of adverse reaction to anesthesia    pt's mother has hx. of post-op N/V and hard to wake up post-op  . History of panic attacks    02/08/17- has not had a panic attacks  . Lymphoma ([place]) 01/2017  . Mesenteric lymphadenopathy 01/2017  . Morbid obesity with BMI of 40.0-44.9, adult ([place])   . Runny nose 02/19/2017   clear drainage, per pt.  . Seasonal allergies     SURGICAL HISTORY: Past Surgical History:  Procedure Laterality Date  . LYMPH NODE BIOPSY N/A 02/11/2017   Procedure: HAND ASSISTED LAPAROSCOPIC LYMPH NODE BIOPSY;  Surgeon: Stark Klein, MD;  Location: [facility];  Service: General;  Laterality: N/A;  . PORT-A-CATH REMOVAL N/A 09/04/2017   Procedure: REMOVAL PORT-A-CATH;  Surgeon: Stark Klein, MD;  Location: [place];  Service: General;  Laterality: N/A;  . PORTACATH PLACEMENT  N/A 02/20/2017   Procedure: INSERTION PORT-A-CATH;  Surgeon: Stark Klein, MD;  Location: [place];  Service: General;  Laterality: N/A;  . TUBAL LIGATION    . UMBILICAL  HERNIA REPAIR  02/11/2017    SOCIAL HISTORY: Social History   Socioeconomic History  . Marital status: Married    Spouse name: Gwyndolyn Saxon  . Number of children: 2  . Years of education: Not on file  . Highest education level: Associate degree: occupational, Hotel manager, or vocational program  Occupational History  . Not on file  Social Needs  . Financial resource strain: Not hard at all  . Food insecurity:    Worry: Never true    Inability: Never true  . Transportation needs:    Medical: No    Non-medical: No  Tobacco Use  . Smoking status: Never Smoker  . Smokeless tobacco: Never Used  Substance and Sexual Activity  . Alcohol use: No    Alcohol/week: 0.0 standard drinks  . Drug use: No  . Sexual activity: Yes    Partners: Male    Birth control/protection: IUD    Comment: tubal ligation  Lifestyle  . Physical activity:    Days per week: 0 days    Minutes per session: 0 min  . Stress: Rather much  Relationships  . Social connections:    Talks on phone: More than three times a week    Gets together: Never    Attends religious service: Never    Active member of club or organization: No    Attends meetings of clubs or organizations: Never    Relationship status: Married  . Intimate partner violence:    Fear of current or ex partner: No    Emotionally abused: No    Physically abused: No    Forced sexual activity: No  Other Topics Concern  . Not on file  Social History Narrative  . Not on file    FAMILY HISTORY: Family History  Problem Relation Age of Onset  . Breast cancer Mother 14  . Fibromyalgia Mother   . Rheum arthritis Mother   . Neuropathy Mother   . Anesthesia problems Mother        post-op N/V; hard to wake up post-op  . Congestive Heart Failure Father        died at age 80  . Cirrhosis Father   . Heart disease Maternal Grandmother   . Diabetes Maternal Grandmother   . Other Brother        Benign spinal tumor  . Cancer Paternal Grandfather         Leukemia    ALLERGIES:  is allergic to clindamycin/lincomycin and cymbalta [duloxetine hcl].  MEDICATIONS:  Current Outpatient Medications  Medication Sig Dispense Refill  . Biotin 10 MG TABS Take by mouth.    . levonorgestrel (MIRENA) 20 MCG/24HR IUD 1 each by Intrauterine route once.    . Multiple Vitamin (MULTIVITAMIN) tablet Take 1 tablet by mouth daily.     No current facility-administered medications for this visit.    Facility-Administered Medications Ordered in Other Visits  Medication Dose Route Frequency Provider Last Rate Last Dose  . sodium chloride flush (NS) 0.9 % injection 10 mL  10 mL Intracatheter PRN Brunetta Genera, MD   10 mL at 03/26/17 1724  . sodium chloride flush (NS) 0.9 % injection 10 mL  10 mL Intravenous PRN Brunetta Genera, MD   10 mL at 04/16/17 1938    REVIEW OF SYSTEMS:   .  10 Point review of Systems was done is negative except as noted above.  PHYSICAL EXAMINATION:  ECOG PERFORMANCE STATUS: 1 - Symptomatic but completely ambulatory  Vitals:   05/23/18 1032  BP: 111/75  Pulse: 93  Resp: 18  Temp: 98.3 F (36.8 C)  SpO2: 99%   Filed Weights   05/23/18 1032  Weight: 245 lb 3.2 oz (111.2 kg)   .Body mass index is 43.44 kg/m. Marland Kitchen GENERAL:alert, in no acute distress and comfortable SKIN: no acute rashes, no significant lesions EYES: conjunctiva are pink and non-injected, sclera anicteric OROPHARYNX: MMM, no exudates, no oropharyngeal erythema or ulceration NECK: supple, no JVD LYMPH:  no palpable lymphadenopathy in the cervical, axillary or inguinal regions LUNGS: clear to auscultation b/l with normal respiratory effort HEART: regular rate & rhythm ABDOMEN:  normoactive bowel sounds , non tender, not distended. Extremity: no pedal edema PSYCH: alert & oriented x 3 with fluent speech NEURO: no focal motor/sensory deficits  LABORATORY DATA:  I have reviewed the data as listed   . CBC Latest Ref Rng & Units 05/23/2018  02/20/2018 11/19/2017  WBC 4.0 - 10.5 K/uL 4.3 5.1 4.4  Hemoglobin 12.0 - 15.0 g/dL 15.3(H) 14.4 14.5  Hematocrit 36.0 - 46.0 % 47.0(H) 43.9 43.9  Platelets 150 - 400 K/uL 228 253 215     . CMP Latest Ref Rng & Units 05/23/2018 02/20/2018 11/19/2017  Glucose 70 - 99 mg/dL 100(H) 99 80  BUN 6 - 20 mg/dL 10 9 10   Creatinine 0.44 - 1.00 mg/dL 0.94 1.04(H) 0.97  Sodium 135 - 145 mmol/L 141 142 138  Potassium 3.5 - 5.1 mmol/L 4.1 4.2 4.2  Chloride 98 - 111 mmol/L 105 105 105  CO2 22 - 32 mmol/L 26 27 23   Calcium 8.9 - 10.3 mg/dL 9.7 9.7 9.3  Total Protein 6.5 - 8.1 g/dL 7.3 7.0 7.3  Total Bilirubin 0.3 - 1.2 mg/dL 0.5 0.3 0.4  Alkaline Phos 38 - 126 U/L 62 60 50  AST 15 - 41 U/L 17 15 20   ALT 0 - 44 U/L 13 9 15    . Lab Results  Component Value Date   LDH 134 05/23/2018    Lymphnode biopsy, 02/11/2017 Diagnosis Lymph node for lymphoma, Mesenteric Lymphadenopathy - HODGKIN LYMPHOMA.         PROCEDURES  ECHO 03/13/17 Study Conclusions - Left ventricle: The cavity size was normal. Systolic function was  normal. The estimated ejection fraction was in the range of 55%  to 60%. Wall motion was normal; there were no regional wall  motion abnormalities. Left ventricular diastolic function  parameters were normal. - Atrial septum: No defect or patent foramen ovale was identified. - Impressions: Normal GLS -20.2.   RADIOGRAPHIC STUDIES: I have personally reviewed the radiological images as listed and agreed with the findings in the report. No results found.    ASSESSMENT & PLAN:   37 y.o. 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 -PET/CT showed fairly active disease. Abdominal disease if progression occurs could  potentially cause organ injury. -LDH level and sedimentation rate within normal limits . -HIV negative  -Hepatitis C negative.  -Baseline ECHO from 03/13/17 shows normal heart function, EF 55%-60%  Rpt PET/CT on 05/29/2017 - showed Significant reduction in size and metabolic activity of the formerly extensive adenopathy in the  chest and abdomen, with primarily Deauville 3 activity today, and some Deauville 2 activity. Previously this was Deauville 5.  S/p R-CHOP x 6 cycles  PET/CT 08/06/2017: No evidence active lymphoma with minimal activity within the remaining periaortic and mesenteric lymph nodes - Deauville 2  PLAN:  -Discussed pt labwork today, 05/23/2018; blood counts and chemistries are stable. LDH wnl.  -Recommended that the pt do her best to stay physically active, eat well, and stay hydrated -The pt shows no clinical or lab progression of Nodular Hodgkin's Lymphoma at this time.  -No indication for further treatment at this time. -Will see the pt back in 3 months, sooner if any new concerns    3) Patient Active Problem List   Diagnosis Date Noted  . Migraine 09/03/2017  . Counseling regarding advanced care planning and goals of care 06/24/2017  . Nodular lymphocyte predominant Hodgkin lymphoma of lymph nodes of multiple regions ([place]) 03/19/2017  . Mesenteric lymphadenopathy 02/11/2017  . Adenopathy 01/10/2017  . 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 Abnormal chest CT 01/07/2017  . History of panic attacks   . Bacterial pharyngitis 07/30/2016  . 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 -continue taking Vitamin B complex with meals to assist with this.    RTC with Dr [name] in 3 months with labs   All of the patients questions were answered with apparent satisfaction. The patient  knows to call the clinic with any problems, questions or concerns.  . The total time spent in the appointment was 20 minutes and more than 50% was on counseling and direct patient cares.   Sullivan Lone MD [place] AAHIVMS [facility] Hematology/Oncology Physician [facility]  (Office):       [phone] (Work cell):  [phone] (Fax):           [phone]

## 2018-06-03 ENCOUNTER — Telehealth: Payer: Self-pay | Admitting: *Deleted

## 2018-06-03 NOTE — Telephone Encounter (Signed)
Patient called. States she wants Dr. [name] to know that since last appt, she has developed a swollen area under chin on right side that is tender to the touch.

## 2018-06-03 NOTE — Telephone Encounter (Deleted)
Patient called. States she wants Dr. [name] to know that since last appt, she has developed a swollen area under chin on right side that is tender to the touch.

## 2018-06-04 ENCOUNTER — Telehealth: Payer: Self-pay | Admitting: *Deleted

## 2018-06-04 NOTE — Telephone Encounter (Signed)
Contacted patient. She states lump under chin is still present. It is same size and still painful, but not quite as much. Denies dental problems. States she does have some sinus drainage. States she does want the lump to be evaluated because it has happened since she was last here. Per Dr. [name], schedule pt appt. Advised patient she would be contacted by scheduling.

## 2018-06-05 ENCOUNTER — Telehealth: Payer: Self-pay | Admitting: Hematology

## 2018-06-05 NOTE — Progress Notes (Signed)
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   HISTORY OF PRESENTING ILLNESS:  Bonnie Larsen is a wonderful 37 y.o. female who has been referred to [place] by Dr .Patient, No Pcp Per for evaluation and management of thoracic paraspinal masses and upper abdominal extensive lymphadenopathy.  Patient has a history of uterine fibroids, right breast benign lesion biopsied in 2016, anxiety/panic attacks, migraine headaches and chronic sinus issues who presented to the emergency room brought by EMS for chest pain and shortness of breath about 7-10 days ago. She was noted to be hypertensive with a blood pressure of 180/110 which improved with nitroglycerin. She was also having some back pain and felt like her chest pain was radiating to the back. D-dimer level was borderline elevated. She had a CTA of the chest on 01/06/2017 that showed no evidence of pulmonary embolism. She was however noted to have Multiple prevertebral paraspinal masses, differential diagnosis includes extramedullary hematopoiesis and metastasis/lymphadenopathy. Recommend correlation for laboratory analysis for anemia and lymphoproliferative disease.  Patient was subsequently seen by her primary care physician and had a CT of the abdomen and pelvis with contrast on 01/09/2017 which showed Extensive upper abdominal adenopathy favoring lymphoma/lymphoproliferative disease, tissue diagnosis recommended. No splenomegaly or focal splenic lesion.  She was referred to [place] for further evaluation and management.  She was here with her mother and husband.  She notes no abdominal, or chest trauma. No fevers chills night sweats or unexpected weight loss. Mild constipation but no other change in bowel  habits. No GI bleeding noted. No change in food intake or swallowing. No urinary discomfort or abnormalities. Currently having no chest pain or shortness of breath. Mild upper abdominal discomfort. No other obvious peripheral lymphadenopathy noted.   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 The patient's last visit with [place] was on 05/23/18. The pt reports that she is doing well overall.   I last saw the patient on 05/23/18, however, the patient called my office on 06/03/18 regarding a new swelling under her right ear.  The pt reports that she first noticed that there was swelling under her right ear on Monday night 06/02/18, after yawning and rubbing her neck. She felt some pain, and then called my office the next day. The pt also notes that her sinuses have been swollen and she endorses right side tenderness to maxillary sinus. She denies any painful teeth. She notes that her throat hurts on the right side when she swallows. She also notes lots of sinus drainage at night when she lies down. She denies pain over the joints or outer ear pain. She denies coughing up any phlegm.   The pt also notes that she felt some inner ear pain when she was in the cold outside.  She notes that she has frequent sinus infections at this time of year. She notes that she normally takes Sudafed.   The pt notes that she does not feel the same swelling under her right ear today, though pain remains.   On review of systems, pt reports right sinus pressure and tenderness, pain in right side of  throat when swallowing, resolved swelling under right ear, frequent sinus infections, and denies fevers, abnormal colored discharge, SOB, and any other symptoms.   MEDICAL HISTORY:  Past Medical History:  Diagnosis Date  . Anxiety   . Difficult intravenous access   .  Family history of adverse reaction to anesthesia    pt's mother has hx. of post-op N/V and hard to wake up post-op  . History of panic attacks    02/08/17- has not had a panic attacks  . Lymphoma ([place]) 01/2017  . Mesenteric lymphadenopathy 01/2017  . Morbid obesity with BMI of 40.0-44.9, adult ([place])   . Runny nose 02/19/2017   clear drainage, per pt.  . Seasonal allergies     SURGICAL HISTORY: Past Surgical History:  Procedure Laterality Date  . LYMPH NODE BIOPSY N/A 02/11/2017   Procedure: HAND ASSISTED LAPAROSCOPIC LYMPH NODE BIOPSY;  Surgeon: Stark Klein, MD;  Location: [facility];  Service: General;  Laterality: N/A;  . PORT-A-CATH REMOVAL N/A 09/04/2017   Procedure: REMOVAL PORT-A-CATH;  Surgeon: Stark Klein, MD;  Location: [facility];  Service: General;  Laterality: N/A;  . PORTACATH PLACEMENT N/A 02/20/2017   Procedure: INSERTION PORT-A-CATH;  Surgeon: Stark Klein, MD;  Location: [facility];  Service: General;  Laterality: N/A;  . TUBAL LIGATION    . UMBILICAL HERNIA REPAIR  02/11/2017    SOCIAL HISTORY: Social History   Socioeconomic History  . Marital status: Married    Spouse name: Gwyndolyn Saxon  . Number of children: 2  . Years of education: Not on file  . Highest education level: Associate degree: occupational, Hotel manager, or vocational program  Occupational History  . Not on file  Social Needs  . Financial resource strain: Not hard at all  . Food insecurity:    Worry: Never true    Inability: Never true  . Transportation needs:    Medical: No    Non-medical: No  Tobacco Use  . Smoking status: Never Smoker  . Smokeless tobacco: Never Used  Substance and Sexual Activity  . Alcohol use: No    Alcohol/week: 0.0 standard drinks  . Drug use: No  . Sexual activity: Yes    Partners: Male    Birth control/protection: I.U.D.    Comment: tubal ligation  Lifestyle  . Physical activity:    Days per week: 0 days    Minutes per session: 0 min  . Stress: Rather  much  Relationships  . Social connections:    Talks on phone: More than three times a week    Gets together: Never    Attends religious service: Never    Active member of club or organization: No    Attends meetings of clubs or organizations: Never    Relationship status: Married  . Intimate partner violence:    Fear of current or ex partner: No    Emotionally abused: No    Physically abused: No    Forced sexual activity: No  Other Topics Concern  . Not on file  Social History Narrative  . Not on file    FAMILY HISTORY: Family History  Problem Relation Age of Onset  . Breast cancer Mother 26  . Fibromyalgia Mother   . Rheum arthritis Mother   . Neuropathy Mother   . Anesthesia problems Mother        post-op N/V; hard to wake up post-op  . Congestive Heart Failure Father        died at age 57  . Cirrhosis  Father   . Heart disease Maternal Grandmother   . Diabetes Maternal Grandmother   . Other Brother        Benign spinal tumor  . Cancer Paternal Grandfather        Leukemia    ALLERGIES:  is allergic to clindamycin/lincomycin and cymbalta [duloxetine hcl].  MEDICATIONS:  Current Outpatient Medications  Medication Sig Dispense Refill  . amoxicillin-clavulanate (AUGMENTIN) 875-125 MG tablet Take 1 tablet by mouth 2 (two) times daily. 14 tablet 0  . Biotin 10 MG TABS Take by mouth.    . levonorgestrel (MIRENA) 20 MCG/24HR IUD 1 each by Intrauterine route once.    . Multiple Vitamin (MULTIVITAMIN) tablet Take 1 tablet by mouth daily.     No current facility-administered medications for this visit.    Facility-Administered Medications Ordered in Other Visits  Medication Dose Route Frequency Provider Last Rate Last Dose  . sodium chloride flush (NS) 0.9 % injection 10 mL  10 mL Intracatheter PRN Brunetta Genera, MD   10 mL at 03/26/17 1724  . sodium chloride flush (NS) 0.9 % injection 10 mL  10 mL Intravenous PRN Brunetta Genera, MD   10 mL at 04/16/17 1938      REVIEW OF SYSTEMS:   A 10+ POINT REVIEW OF SYSTEMS WAS OBTAINED including neurology, dermatology, psychiatry, cardiac, respiratory, lymph, extremities, GI, GU, Musculoskeletal, constitutional, breasts, reproductive, HEENT.  All pertinent positives are noted in the HPI.  All others are negative.   PHYSICAL EXAMINATION:  ECOG PERFORMANCE STATUS: 1 - Symptomatic but completely ambulatory  Vitals:   06/06/18 1359  BP: 114/77  Pulse: 88  Resp: 18  Temp: 97.8 F (36.6 C)  SpO2: 100%   Filed Weights   06/06/18 1359  Weight: 249 lb 6.4 oz (113.1 kg)   .Body mass index is 44.18 kg/m.  GENERAL:alert, in no acute distress and comfortable SKIN: no acute rashes, no significant lesions NOSE: tenderness to palpation over right maxillary sinus EYES: conjunctiva are pink and non-injected, sclera anicteric OROPHARYNX: MMM, no exudates, no oropharyngeal erythema or ulceration NECK: supple, no JVD LYMPH:  no palpable lymphadenopathy in the cervical, axillary or inguinal regions LUNGS: clear to auscultation b/l with normal respiratory effort HEART: regular rate & rhythm ABDOMEN:  normoactive bowel sounds , non tender, not distended. No palpable hepatosplenomegaly.  Extremity: no pedal edema PSYCH: alert & oriented x 3 with fluent speech NEURO: no focal motor/sensory deficits   LABORATORY DATA:  I have reviewed the data as listed   . CBC Latest Ref Rng & Units 05/23/2018 02/20/2018 11/19/2017  WBC 4.0 - 10.5 K/uL 4.3 5.1 4.4  Hemoglobin 12.0 - 15.0 g/dL 15.3(H) 14.4 14.5  Hematocrit 36.0 - 46.0 % 47.0(H) 43.9 43.9  Platelets 150 - 400 K/uL 228 253 215     . CMP Latest Ref Rng & Units 05/23/2018 02/20/2018 11/19/2017  Glucose 70 - 99 mg/dL 100(H) 99 80  BUN 6 - 20 mg/dL 10 9 10   Creatinine 0.44 - 1.00 mg/dL 0.94 1.04(H) 0.97  Sodium 135 - 145 mmol/L 141 142 138  Potassium 3.5 - 5.1 mmol/L 4.1 4.2 4.2  Chloride 98 - 111 mmol/L 105 105 105  CO2 22 - 32 mmol/L 26 27 23   Calcium 8.9  - 10.3 mg/dL 9.7 9.7 9.3  Total Protein 6.5 - 8.1 g/dL 7.3 7.0 7.3  Total Bilirubin 0.3 - 1.2 mg/dL 0.5 0.3 0.4  Alkaline Phos 38 - 126 U/L 62 60 50  AST 15 - 41  U/L 17 15 20   ALT 0 - 44 U/L 13 9 15    . Lab Results  Component Value Date   LDH 134 05/23/2018    Lymphnode biopsy, 02/11/2017 Diagnosis Lymph node for lymphoma, Mesenteric Lymphadenopathy - HODGKIN LYMPHOMA.         PROCEDURES  ECHO 03/13/17 Study Conclusions - Left ventricle: The cavity size was normal. Systolic function was  normal. The estimated ejection fraction was in the range of 55%  to 60%. Wall motion was normal; there were no regional wall  motion abnormalities. Left ventricular diastolic function  parameters were normal. - Atrial septum: No defect or patent foramen ovale was identified. - Impressions: Normal GLS -20.2.   RADIOGRAPHIC STUDIES: I have personally reviewed the radiological images as listed and agreed with the findings in the report. No results found.    ASSESSMENT & PLAN:   37 y.o. 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 -PET/CT showed fairly active disease. Abdominal disease if progression occurs could potentially cause organ injury. -LDH level and sedimentation rate within normal limits . -HIV negative  -Hepatitis C negative.  -Baseline ECHO from 03/13/17 shows normal heart function, EF 55%-60%  Rpt PET/CT on 05/29/2017 - showed Significant reduction in size and metabolic activity of the formerly extensive adenopathy in the chest and abdomen, with primarily Deauville 3 activity today, and some Deauville 2 activity. Previously this was Deauville 5.  S/p R-CHOP x 6 cycles  PET/CT 08/06/2017: No evidence active lymphoma  with minimal activity within the remaining periaortic and mesenteric lymph nodes - Deauville 2  PLAN:  -Last available Sed Rate from 05/23/18 is at 54mm/hr -Sinus tenderness and no palpable lymph nodes  -Discussed that I am most concerned that the patient has a sinus infection and not concerned at this time that her lymphoma is returning -Recommended using steam inhalation -Patient will fill prescription for Augmentin if she develops fevers, begins feeling much worse, or develops any abnormally colored phlegm or nasal discharge -Pt currently has no fevers, will give Flu shot in clinic today  -The pt shows no clinical or lab progression/return of Nodular Hodgkin's lymphoma at this time.  -No indication for further treatment at this time.  -Recommended that the pt do her best to stay physically active, eat well, and stay hydrated -Will see the pt back on August 26, 2018    3) Patient Active Problem List   Diagnosis Date Noted  . Migraine 09/03/2017  . Counseling regarding advanced care planning and goals of care 06/24/2017  . Nodular lymphocyte predominant Hodgkin lymphoma of lymph nodes of multiple regions ([place]) 03/19/2017  . Mesenteric lymphadenopathy 02/11/2017  . Adenopathy 01/10/2017  . 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 Abnormal chest CT 01/07/2017  . History of panic attacks   . Bacterial pharyngitis 07/30/2016  . 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 -continue taking Vitamin B complex with meals to assist with this.    Flu shot today Other f/u appointments as already scheduled on 08/26/2018    All of the patients questions were answered with apparent satisfaction. The patient knows to call the clinic with any  problems, questions or concerns.  The total time spent in the appt was  15 minutes and more than 50% was on counseling and direct patient cares.   Sullivan Lone MD [place] AAHIVMS [facility] Hematology/Oncology Physician [facility]  (Office):       [phone] (Work cell):  [phone] (Fax):           [phone]  I, Baldwin [place], am acting as a scribe for Dr. Sullivan Lone.   .I have reviewed the above documentation for accuracy and completeness, and I agree with the above. Brunetta Genera MD

## 2018-06-05 NOTE — Telephone Encounter (Signed)
Scheduled appt per 12/12 sch message - pt is aware of appt date and time .   

## 2018-06-06 ENCOUNTER — Inpatient Hospital Stay: Payer: 59 | Attending: Hematology | Admitting: Hematology

## 2018-06-06 VITALS — BP 114/77 | HR 88 | Temp 97.8°F | Resp 18 | Ht 63.0 in | Wt 249.4 lb

## 2018-06-06 DIAGNOSIS — Z79899 Other long term (current) drug therapy: Secondary | ICD-10-CM | POA: Diagnosis not present

## 2018-06-06 DIAGNOSIS — J0100 Acute maxillary sinusitis, unspecified: Secondary | ICD-10-CM

## 2018-06-06 DIAGNOSIS — C8108 Nodular lymphocyte predominant Hodgkin lymphoma, lymph nodes of multiple sites: Secondary | ICD-10-CM | POA: Insufficient documentation

## 2018-06-06 DIAGNOSIS — Z975 Presence of (intrauterine) contraceptive device: Secondary | ICD-10-CM | POA: Insufficient documentation

## 2018-06-06 DIAGNOSIS — Z9221 Personal history of antineoplastic chemotherapy: Secondary | ICD-10-CM | POA: Diagnosis not present

## 2018-06-06 DIAGNOSIS — F419 Anxiety disorder, unspecified: Secondary | ICD-10-CM | POA: Insufficient documentation

## 2018-06-06 DIAGNOSIS — Z23 Encounter for immunization: Secondary | ICD-10-CM | POA: Insufficient documentation

## 2018-06-06 DIAGNOSIS — J329 Chronic sinusitis, unspecified: Secondary | ICD-10-CM | POA: Diagnosis not present

## 2018-06-06 MED ORDER — AMOXICILLIN-POT CLAVULANATE 875-125 MG PO TABS: 1.0000 | ORAL_TABLET | Freq: Two times a day (BID) | ORAL | 0 refills | Status: DC

## 2018-06-06 MED ORDER — INFLUENZA VAC SPLIT QUAD 0.5 ML IM SUSY
0.5000 mL | PREFILLED_SYRINGE | Freq: Once | INTRAMUSCULAR | Status: AC
  Administered 2018-06-06: 0.5 mL via INTRAMUSCULAR

## 2018-06-06 MED ORDER — INFLUENZA VAC SPLIT QUAD 0.5 ML IM SUSY
PREFILLED_SYRINGE | INTRAMUSCULAR | Status: AC
  Filled 2018-06-06: qty 0.5

## 2018-06-10 ENCOUNTER — Telehealth: Payer: Self-pay

## 2018-06-10 NOTE — Telephone Encounter (Signed)
Per 12/13 already scheduled.

## 2018-08-22 NOTE — Progress Notes (Signed)
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   HISTORY OF PRESENTING ILLNESS:  Bonnie Larsen is a wonderful 38 y.o. female who has been referred to [place] by Dr .Patient, No Pcp Per for evaluation and management of thoracic paraspinal masses and upper abdominal extensive lymphadenopathy.  Patient has a history of uterine fibroids, right breast benign lesion biopsied in 2016, anxiety/panic attacks, migraine headaches and chronic sinus issues who presented to the emergency room brought by EMS for chest pain and shortness of breath about 7-10 days ago. She was noted to be hypertensive with a blood pressure of 180/110 which improved with nitroglycerin. She was also having some back pain and felt like her chest pain was radiating to the back. D-dimer level was borderline elevated. She had a CTA of the chest on 01/06/2017 that showed no evidence of pulmonary embolism. She was however noted to have Multiple prevertebral paraspinal masses, differential diagnosis includes extramedullary hematopoiesis and metastasis/lymphadenopathy. Recommend correlation for laboratory analysis for anemia and lymphoproliferative disease.  Patient was subsequently seen by her primary care physician and had a CT of the abdomen and pelvis with contrast on 01/09/2017 which showed Extensive upper abdominal adenopathy favoring lymphoma/lymphoproliferative disease, tissue diagnosis recommended. No splenomegaly or focal splenic lesion.  She was referred to [place] for further evaluation and management.  She was here with her mother and husband.  She notes no abdominal, or chest trauma. No fevers chills night sweats or unexpected weight loss. Mild constipation but no other change in bowel habits.  No GI bleeding noted. No change in food intake or swallowing. No urinary discomfort or abnormalities. Currently having no chest pain or shortness of breath. Mild upper abdominal discomfort. No other obvious peripheral lymphadenopathy noted.   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 The patient's last visit with [place] was on 06/06/18. She is accompanied today by her mother. The pt reports that she is doing well overall.   The pt reports that over the last two weeks she has been having night sweats some of which she characterizes as drenching, has felt very tired, and doesn't "feel like doing anything." She thought she was getting a cold, but notes that it never amounted to an obvious infection. The pt also describes difficulty focusing on tasks at work. She denies abdominal pains or noticing any new lumps or bumps. The pt notes that she is eating mostly well. She denies medication changes. She denies sinus symptoms. She notes that her migraines have been bothersome in the last few days as well and takes Excedrin for these. She adds that her night sweats occur on days in which she takes Excedrin.  The pt notes that her work is stressful and involves her being on a computer "all day long." The pt notes anxiety and stress with her teenage children. She has been getting up at 3am for her work as well.  Lab results today (08/26/18) of CBC w/diff and CMP is as follows: all values are WNL except for RBC at 5.57, MCH at 25.3, Glucose at 109. 08/26/18 LDH at 131  On review of systems,  pt reports recent migraines, recent night sweats, feeling tired, moving her bowels well ,recent stress, and denies abdominal pains, noticing any new lumps or bumps, unexpected weight loss, leg swelling, fevers, and any other symptoms.  MEDICAL HISTORY:  Past Medical History:    Diagnosis Date  . Anxiety   . Difficult intravenous access   . Family history of adverse reaction to anesthesia    pt's mother has hx. of post-op N/V and hard to wake up post-op  . History of panic attacks    02/08/17- has not had a panic attacks  . Lymphoma ([place]) 01/2017  . Mesenteric lymphadenopathy 01/2017  . Morbid obesity with BMI of 40.0-44.9, adult ([place])   . Runny nose 02/19/2017   clear drainage, per pt.  . Seasonal allergies     SURGICAL HISTORY: Past Surgical History:  Procedure Laterality Date  . LYMPH NODE BIOPSY N/A 02/11/2017   Procedure: HAND ASSISTED LAPAROSCOPIC LYMPH NODE BIOPSY;  Surgeon: Stark Klein, MD;  Location: [facility];  Service: General;  Laterality: N/A;  . PORT-A-CATH REMOVAL N/A 09/04/2017   Procedure: REMOVAL PORT-A-CATH;  Surgeon: Stark Klein, MD;  Location: [facility];  Service: General;  Laterality: N/A;  . PORTACATH PLACEMENT N/A 02/20/2017   Procedure: INSERTION PORT-A-CATH;  Surgeon: Stark Klein, MD;  Location: [facility];  Service: General;  Laterality: N/A;  . TUBAL LIGATION    . UMBILICAL HERNIA REPAIR  02/11/2017    SOCIAL HISTORY: Social History   Socioeconomic History  . Marital status: Married    Spouse name: Gwyndolyn Saxon  . Number of children: 2  . Years of education: Not on file  . Highest education level: Associate degree: occupational, Hotel manager, or vocational program  Occupational History  . Not on file  Social Needs  . Financial resource strain: Not hard at all  . Food insecurity:    Worry: Never true    Inability: Never true  . Transportation needs:    Medical: No    Non-medical: No  Tobacco Use  . Smoking status: Never Smoker  . Smokeless tobacco: Never Used  Substance and Sexual Activity  . Alcohol use: No    Alcohol/week: 0.0 standard drinks  . Drug use: No  . Sexual activity: Yes    Partners: Male    Birth control/protection: I.U.D.    Comment: tubal ligation  Lifestyle  . Physical activity:     Days per week: 0 days    Minutes per session: 0 min  . Stress: Rather much  Relationships  . Social connections:    Talks on phone: More than three times a week    Gets together: Never    Attends religious service: Never    Active member of club or organization: No    Attends meetings of clubs or organizations: Never    Relationship status: Married  . Intimate partner violence:    Fear of current or ex partner: No    Emotionally abused: No    Physically abused: No    Forced sexual activity: No  Other Topics Concern  . Not on file  Social History Narrative  . Not on file    FAMILY HISTORY: Family History  Problem Relation Age of Onset  . Breast cancer Mother 12  . Fibromyalgia Mother   . Rheum arthritis Mother   . Neuropathy Mother   . Anesthesia problems Mother        post-op N/V; hard to wake up post-op  . Congestive Heart Failure Father  died at age 34  . Cirrhosis Father   . Heart disease Maternal Grandmother   . Diabetes Maternal Grandmother   . Other Brother        Benign spinal tumor  . Cancer Paternal Grandfather        Leukemia    ALLERGIES:  is allergic to clindamycin/lincomycin and cymbalta [duloxetine hcl].  MEDICATIONS:  Current Outpatient Medications  Medication Sig Dispense Refill  . amoxicillin-clavulanate (AUGMENTIN) 875-125 MG tablet Take 1 tablet by mouth 2 (two) times daily. 14 tablet 0  . Biotin 10 MG TABS Take by mouth.    . levonorgestrel (MIRENA) 20 MCG/24HR IUD 1 each by Intrauterine route once.    . Multiple Vitamin (MULTIVITAMIN) tablet Take 1 tablet by mouth daily.     No current facility-administered medications for this visit.    Facility-Administered Medications Ordered in Other Visits  Medication Dose Route Frequency Provider Last Rate Last Dose  . sodium chloride flush (NS) 0.9 % injection 10 mL  10 mL Intracatheter PRN Brunetta Genera, MD   10 mL at 03/26/17 1724  . sodium chloride flush (NS) 0.9 % injection 10 mL   10 mL Intravenous PRN Brunetta Genera, MD   10 mL at 04/16/17 1938    REVIEW OF SYSTEMS:   A 10+ POINT REVIEW OF SYSTEMS WAS OBTAINED including neurology, dermatology, psychiatry, cardiac, respiratory, lymph, extremities, GI, GU, Musculoskeletal, constitutional, breasts, reproductive, HEENT.  All pertinent positives are noted in the HPI.  All others are negative.   PHYSICAL EXAMINATION:  ECOG PERFORMANCE STATUS: 1 - Symptomatic but completely ambulatory  Vitals:   08/26/18 1047  BP: 111/65  Pulse: 90  Resp: 18  Temp: 98.3 F (36.8 C)  SpO2: 100%   Filed Weights   08/26/18 1047  Weight: 248 lb 3.2 oz (112.6 kg)   .Body mass index is 43.97 kg/m.  GENERAL:alert, in no acute distress and comfortable SKIN: no acute rashes, no significant lesions EYES: conjunctiva are pink and non-injected, sclera anicteric OROPHARYNX: MMM, no exudates, no oropharyngeal erythema or ulceration NECK: supple, no JVD LYMPH:  no palpable lymphadenopathy in the cervical, axillary or inguinal regions LUNGS: clear to auscultation b/l with normal respiratory effort HEART: regular rate & rhythm ABDOMEN:  normoactive bowel sounds , non tender, not distended. No palpable hepatosplenomegaly.  Extremity: no pedal edema PSYCH: alert & oriented x 3 with fluent speech NEURO: no focal motor/sensory deficits   LABORATORY DATA:  I have reviewed the data as listed   . CBC Latest Ref Rng & Units 08/26/2018 05/23/2018 02/20/2018  WBC 4.0 - 10.5 K/uL 4.4 4.3 5.1  Hemoglobin 12.0 - 15.0 g/dL 14.1 15.3(H) 14.4  Hematocrit 36.0 - 46.0 % 45.1 47.0(H) 43.9  Platelets 150 - 400 K/uL 253 228 253     . CMP Latest Ref Rng & Units 08/26/2018 05/23/2018 02/20/2018  Glucose 70 - 99 mg/dL 109(H) 100(H) 99  BUN 6 - 20 mg/dL 12 10 9   Creatinine 0.44 - 1.00 mg/dL 1.00 0.94 1.04(H)  Sodium 135 - 145 mmol/L 143 141 142  Potassium 3.5 - 5.1 mmol/L 4.3 4.1 4.2  Chloride 98 - 111 mmol/L 105 105 105  CO2 22 - 32 mmol/L 27 26  27   Calcium 8.9 - 10.3 mg/dL 9.5 9.7 9.7  Total Protein 6.5 - 8.1 g/dL 7.2 7.3 7.0  Total Bilirubin 0.3 - 1.2 mg/dL 0.3 0.5 0.3  Alkaline Phos 38 - 126 U/L 57 62 60  AST 15 - 41 U/L 15  17 15  ALT 0 - 44 U/L 12 13 9    . Lab Results  Component Value Date   LDH 131 08/26/2018    Lymphnode biopsy, 02/11/2017 Diagnosis Lymph node for lymphoma, Mesenteric Lymphadenopathy - HODGKIN LYMPHOMA.         PROCEDURES  ECHO 03/13/17 Study Conclusions - Left ventricle: The cavity size was normal. Systolic function was  normal. The estimated ejection fraction was in the range of 55%  to 60%. Wall motion was normal; there were no regional wall  motion abnormalities. Left ventricular diastolic function  parameters were normal. - Atrial septum: No defect or patent foramen ovale was identified. - Impressions: Normal GLS -20.2.   RADIOGRAPHIC STUDIES: I have personally reviewed the radiological images as listed and agreed with the findings in the report. No results found.    ASSESSMENT & PLAN:   38 y.o. 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 -PET/CT showed fairly active disease. Abdominal disease if progression occurs could potentially cause organ injury. -LDH level and sedimentation rate within normal limits . -HIV negative  -Hepatitis C negative.  -Baseline ECHO from 03/13/17 shows normal heart function, EF 55%-60%  Rpt PET/CT on 05/29/2017 - showed Significant reduction in size and metabolic activity of the formerly extensive adenopathy in the chest and abdomen, with primarily Deauville 3 activity today, and some Deauville 2 activity. Previously this was Deauville 5.  S/p R-CHOP x 6 cycles  PET/CT 08/06/2017: No evidence  active lymphoma with minimal activity within the remaining periaortic and mesenteric lymph nodes - Deauville 2  PLAN:  -Discussed pt labwork today, 08/26/18; blood counts and chemistries are stable. LDH is normal at 131. -Discussed that the pt is clinically stable and her labs are stable, which are both reassuring from disease return/progression -Discussed the patient's night sweats in detail, which appear to align with her Excedrin use. She will also wear lighter clothes at night, will discuss her migraines with her PCP, and will let me know if she has worsened or continued night sweats. -Recommended that the pt continue to eat well, drink at least 48-64 oz of water each day, and walk 20-30 minutes each day. -Will see the pt back in 3 months, sooner if any new concerns   3) Patient Active Problem List   Diagnosis Date Noted  . Migraine 09/03/2017  . Counseling regarding advanced care planning and goals of care 06/24/2017  . Nodular lymphocyte predominant Hodgkin lymphoma of lymph nodes of multiple regions ([place]) 03/19/2017  . Mesenteric lymphadenopathy 02/11/2017  . Adenopathy 01/10/2017  . 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 Abnormal chest CT 01/07/2017  . History of panic attacks   . Bacterial pharyngitis 07/30/2016  . 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 -continue taking Vitamin B complex with meals to assist with this.     RTC with Dr [name] in 88months with labs   All of the patients questions were answered with apparent satisfaction. The patient knows to call the clinic with any problems, questions or concerns.  The total time spent in the appt was 20 minutes and more than 50% was on counseling and  direct patient cares.   Sullivan Lone MD [place] AAHIVMS [facility] Hematology/Oncology  Physician [facility]  (Office):       [phone] (Work cell):  [phone] (Fax):           [phone]  I, Baldwin [place], am acting as a scribe for Dr. Sullivan Lone.   .I have reviewed the above documentation for accuracy and completeness, and I agree with the above. Brunetta Genera MD

## 2018-08-26 ENCOUNTER — Inpatient Hospital Stay (HOSPITAL_BASED_OUTPATIENT_CLINIC_OR_DEPARTMENT_OTHER): Payer: 59 | Admitting: Hematology

## 2018-08-26 ENCOUNTER — Inpatient Hospital Stay: Payer: 59 | Attending: Hematology

## 2018-08-26 VITALS — BP 111/65 | HR 90 | Temp 98.3°F | Resp 18 | Ht 63.0 in | Wt 248.2 lb

## 2018-08-26 DIAGNOSIS — C8108 Nodular lymphocyte predominant Hodgkin lymphoma, lymph nodes of multiple sites: Secondary | ICD-10-CM | POA: Diagnosis not present

## 2018-08-26 DIAGNOSIS — F419 Anxiety disorder, unspecified: Secondary | ICD-10-CM

## 2018-08-26 DIAGNOSIS — G629 Polyneuropathy, unspecified: Secondary | ICD-10-CM | POA: Diagnosis not present

## 2018-08-26 LAB — CBC WITH DIFFERENTIAL/PLATELET
Abs Immature Granulocytes: 0.01 10*3/uL (ref 0.00–0.07)
BASOS ABS: 0 10*3/uL (ref 0.0–0.1)
BASOS PCT: 0 %
EOS ABS: 0.1 10*3/uL (ref 0.0–0.5)
EOS PCT: 1 %
HCT: 45.1 % (ref 36.0–46.0)
Hemoglobin: 14.1 g/dL (ref 12.0–15.0)
Immature Granulocytes: 0 %
LYMPHS PCT: 23 %
Lymphs Abs: 1 10*3/uL (ref 0.7–4.0)
MCH: 25.3 pg — AB (ref 26.0–34.0)
MCHC: 31.3 g/dL (ref 30.0–36.0)
MCV: 81 fL (ref 80.0–100.0)
Monocytes Absolute: 0.3 10*3/uL (ref 0.1–1.0)
Monocytes Relative: 7 %
NRBC: 0 % (ref 0.0–0.2)
Neutro Abs: 3 10*3/uL (ref 1.7–7.7)
Neutrophils Relative %: 69 %
Platelets: 253 10*3/uL (ref 150–400)
RBC: 5.57 MIL/uL — AB (ref 3.87–5.11)
RDW: 13.3 % (ref 11.5–15.5)
WBC: 4.4 10*3/uL (ref 4.0–10.5)

## 2018-08-26 LAB — CMP (CANCER CENTER ONLY)
ALT: 12 U/L (ref 0–44)
AST: 15 U/L (ref 15–41)
Albumin: 3.9 g/dL (ref 3.5–5.0)
Alkaline Phosphatase: 57 U/L (ref 38–126)
Anion gap: 11 (ref 5–15)
BUN: 12 mg/dL (ref 6–20)
CALCIUM: 9.5 mg/dL (ref 8.9–10.3)
CHLORIDE: 105 mmol/L (ref 98–111)
CO2: 27 mmol/L (ref 22–32)
CREATININE: 1 mg/dL (ref 0.44–1.00)
GFR, Estimated: >60 mL/min (ref >60)
Glucose, Bld: 109 mg/dL — ABNORMAL HIGH (ref 70–99)
Potassium: 4.3 mmol/L (ref 3.5–5.1)
Sodium: 143 mmol/L (ref 135–145)
Total Bilirubin: 0.3 mg/dL (ref 0.3–1.2)
Total Protein: 7.2 g/dL (ref 6.5–8.1)

## 2018-08-26 LAB — LACTATE DEHYDROGENASE: LDH: 131 U/L (ref 98–192)

## 2018-08-27 ENCOUNTER — Telehealth: Payer: Self-pay | Admitting: Hematology

## 2018-08-27 NOTE — Telephone Encounter (Signed)
Called patient and scheduled appt per 3/3 los.  Patient did not answer.  Printed and mailed calendar.

## 2018-10-23 ENCOUNTER — Telehealth: Payer: Self-pay | Admitting: *Deleted

## 2018-10-23 NOTE — Telephone Encounter (Signed)
Was told by Dr. [name] to call office if any new concerns before appt in June. Last 4 days, having sharp pains in back, between shoulder blades. Helped some by Aleve. Also experiencing increase in HR while at rest, up to 110.  Not sure if it's anything to be concerned about, but was told to call. Asked if she'd contacted PCP and told she had appt with PCP tomorrow and will also bring it up with her.

## 2018-10-23 NOTE — Telephone Encounter (Signed)
Information given to Dr. [name].

## 2018-11-26 NOTE — Progress Notes (Signed)
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   HISTORY OF PRESENTING ILLNESS:  Bonnie Larsen is a wonderful 38 y.o. female who has been referred to [place] by Dr .Patient, No Pcp Per for evaluation and management of thoracic paraspinal masses and upper abdominal extensive lymphadenopathy.  Patient has a history of uterine fibroids, right breast benign lesion biopsied in 2016, anxiety/panic attacks, migraine headaches and chronic sinus issues who presented to the emergency room brought by EMS for chest pain and shortness of breath about 7-10 days ago. She was noted to be hypertensive with a blood pressure of 180/110 which improved with nitroglycerin. She was also having some back pain and felt like her chest pain was radiating to the back. D-dimer level was borderline elevated. She had a CTA of the chest on 01/06/2017 that showed no evidence of pulmonary embolism. She was however noted to have Multiple prevertebral paraspinal masses, differential diagnosis includes extramedullary hematopoiesis and metastasis/lymphadenopathy. Recommend correlation for laboratory analysis for anemia and lymphoproliferative disease.  Patient was subsequently seen by her primary care physician and had a CT of the abdomen and pelvis with contrast on 01/09/2017 which showed Extensive upper abdominal adenopathy favoring lymphoma/lymphoproliferative disease, tissue diagnosis recommended. No splenomegaly or focal splenic lesion.  She was referred to [place] for further evaluation and management.  She was here with her mother and husband.  She notes no abdominal, or chest trauma. No fevers chills night sweats or unexpected weight loss. Mild constipation but no other change in bowel habits.  No GI bleeding noted. No change in food intake or swallowing. No urinary discomfort or abnormalities. Currently having no chest pain or shortness of breath. Mild upper abdominal discomfort. No other obvious peripheral lymphadenopathy noted.   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 The patient's last visit with [place] was on 08/26/18.  The pt reports that she has been feeling well in the interim. The pt notes that her PCP recently placed her on PO Iron and Vitamin D replacement. She has a mirena IUD for nearly 3 years now and is anticipating replacing this soon. The pt notes that she has not had any fevers, chills, night sweats, or unexpected weight loss.  Lab results today (11/27/18) of CBC w/diff is as follows: all values are WNL except for RBC at 5.50, MCH at 25.5. 11/27/18 Sed Rate, CMP, sed rate and LDH are WNL  On review of systems, pt reports good energy levels, eating well, stable weight,  and denies fevers, chills, night sweats, unexpected weight loss, pain along the spine, and any other symptoms.  MEDICAL HISTORY:  Past Medical History:  Diagnosis Date   Anxiety    Difficult intravenous access    Family history of adverse reaction to anesthesia    pt's mother has hx. of post-op N/V and hard to wake up post-op   History of panic attacks    02/08/17- has not had a panic attacks   Lymphoma ([place]) 01/2017   Mesenteric lymphadenopathy 01/2017   Morbid obesity with BMI of 40.0-44.9, adult ([place])    Runny nose 02/19/2017   clear  drainage, per pt.   Seasonal allergies     SURGICAL HISTORY: Past Surgical History:  Procedure Laterality Date   LYMPH NODE BIOPSY N/A 02/11/2017   Procedure: HAND ASSISTED LAPAROSCOPIC LYMPH NODE BIOPSY;  Surgeon: Stark Klein, MD;  Location: [facility];  Service: General;  Laterality: N/A;    PORT-A-CATH REMOVAL N/A 09/04/2017   Procedure: REMOVAL PORT-A-CATH;  Surgeon: Stark Klein, MD;  Location: [facility];  Service: General;  Laterality: N/A;   PORTACATH PLACEMENT N/A 02/20/2017   Procedure: INSERTION PORT-A-CATH;  Surgeon: Stark Klein, MD;  Location: [facility];  Service: General;  Laterality: N/A;   TUBAL LIGATION     UMBILICAL HERNIA REPAIR  02/11/2017    SOCIAL HISTORY: Social History   Socioeconomic History   Marital status: Married    Spouse name: Gwyndolyn Saxon   Number of children: 2   Years of education: Not on file   Highest education level: Associate degree: occupational, Hotel manager, or vocational program  Occupational History   Not on file  Social Needs   Financial resource strain: Not hard at all   Food insecurity:    Worry: Never true    Inability: Never true   Transportation needs:    Medical: No    Non-medical: No  Tobacco Use   Smoking status: Never Smoker   Smokeless tobacco: Never Used  Substance and Sexual Activity   Alcohol use: No    Alcohol/week: 0.0 standard drinks   Drug use: No   Sexual activity: Yes    Partners: Male    Birth control/protection: I.U.D.    Comment: tubal ligation  Lifestyle   Physical activity:    Days per week: 0 days    Minutes per session: 0 min   Stress: Rather much  Relationships   Social connections:    Talks on phone: More than three times a week    Gets together: Never    Attends religious service: Never    Active member of club or organization: No    Attends meetings of clubs or organizations: Never    Relationship status: Married   Intimate partner violence:    Fear of current or ex partner: No    Emotionally abused: No    Physically abused: No    Forced sexual activity: No  Other Topics Concern   Not on file  Social History Narrative   Not on file    FAMILY HISTORY: Family History  Problem Relation Age of Onset   Breast cancer Mother 55   Fibromyalgia  Mother    Rheum arthritis Mother    Neuropathy Mother    Anesthesia problems Mother        post-op N/V; hard to wake up post-op   Congestive Heart Failure Father        died at age 55   Cirrhosis Father    Heart disease Maternal Grandmother    Diabetes Maternal Grandmother    Other Brother        Benign spinal tumor   Cancer Paternal Grandfather        Leukemia    ALLERGIES:  is allergic to clindamycin/lincomycin and cymbalta [duloxetine hcl].  MEDICATIONS:  Current Outpatient Medications  Medication Sig Dispense Refill   amoxicillin-clavulanate (AUGMENTIN) 875-125 MG tablet Take 1 tablet by mouth 2 (two) times daily. 14 tablet 0   Biotin 10 MG TABS Take by mouth.     levonorgestrel (MIRENA) 20 MCG/24HR IUD 1 each by Intrauterine route once.  Multiple Vitamin (MULTIVITAMIN) tablet Take 1 tablet by mouth daily.     No current facility-administered medications for this visit.    Facility-Administered Medications Ordered in Other Visits  Medication Dose Route Frequency Provider Last Rate Last Dose   sodium chloride flush (NS) 0.9 % injection 10 mL  10 mL Intracatheter PRN Brunetta Genera, MD   10 mL at 03/26/17 1724   sodium chloride flush (NS) 0.9 % injection 10 mL  10 mL Intravenous PRN Brunetta Genera, MD   10 mL at 04/16/17 1938    REVIEW OF SYSTEMS:   A 10+ POINT REVIEW OF SYSTEMS WAS OBTAINED including neurology, dermatology, psychiatry, cardiac, respiratory, lymph, extremities, GI, GU, Musculoskeletal, constitutional, breasts, reproductive, HEENT.  All pertinent positives are noted in the HPI.  All others are negative.   PHYSICAL EXAMINATION:  ECOG PERFORMANCE STATUS: 1 - Symptomatic but completely ambulatory  Vitals:   11/27/18 1051  BP: 114/79  Pulse: 97  Resp: 18  Temp: (!) 97.1 F (36.2 C)  SpO2: 100%   Filed Weights   11/27/18 1051  Weight: 247 lb 11.2 oz (112.4 kg)   .Body mass index is 43.88 kg/m.  GENERAL:alert, in no  acute distress and comfortable SKIN: no acute rashes, no significant lesions EYES: conjunctiva are pink and non-injected, sclera anicteric OROPHARYNX: MMM, no exudates, no oropharyngeal erythema or ulceration NECK: supple, no JVD LYMPH:  no palpable lymphadenopathy in the cervical, axillary or inguinal regions LUNGS: clear to auscultation b/l with normal respiratory effort HEART: regular rate & rhythm ABDOMEN:  normoactive bowel sounds , non tender, not distended. No palpable hepatosplenomegaly.  Extremity: no pedal edema PSYCH: alert & oriented x 3 with fluent speech NEURO: no focal motor/sensory deficits   LABORATORY DATA:  I have reviewed the data as listed   . CBC Latest Ref Rng & Units 11/27/2018 08/26/2018 05/23/2018  WBC 4.0 - 10.5 K/uL 5.5 4.4 4.3  Hemoglobin 12.0 - 15.0 g/dL 14.0 14.1 15.3(H)  Hematocrit 36.0 - 46.0 % 44.4 45.1 47.0(H)  Platelets 150 - 400 K/uL 217 253 228     . CMP Latest Ref Rng & Units 08/26/2018 05/23/2018 02/20/2018  Glucose 70 - 99 mg/dL 109(H) 100(H) 99  BUN 6 - 20 mg/dL 12 10 9   Creatinine 0.44 - 1.00 mg/dL 1.00 0.94 1.04(H)  Sodium 135 - 145 mmol/L 143 141 142  Potassium 3.5 - 5.1 mmol/L 4.3 4.1 4.2  Chloride 98 - 111 mmol/L 105 105 105  CO2 22 - 32 mmol/L 27 26 27   Calcium 8.9 - 10.3 mg/dL 9.5 9.7 9.7  Total Protein 6.5 - 8.1 g/dL 7.2 7.3 7.0  Total Bilirubin 0.3 - 1.2 mg/dL 0.3 0.5 0.3  Alkaline Phos 38 - 126 U/L 57 62 60  AST 15 - 41 U/L 15 17 15   ALT 0 - 44 U/L 12 13 9    . Lab Results  Component Value Date   LDH 131 08/26/2018    Lymphnode biopsy, 02/11/2017 Diagnosis Lymph node for lymphoma, Mesenteric Lymphadenopathy - HODGKIN LYMPHOMA.         PROCEDURES  ECHO 03/13/17 Study Conclusions - Left ventricle: The cavity size was normal. Systolic function was  normal. The estimated ejection fraction was in the range of 55%  to 60%. Wall motion was normal; there were no regional wall  motion abnormalities. Left ventricular  diastolic function  parameters were normal. - Atrial septum: No defect or patent foramen ovale was identified. - Impressions: Normal GLS -20.2.  RADIOGRAPHIC STUDIES: I have personally reviewed the radiological images as listed and agreed with the findings in the report. No results found.    ASSESSMENT & PLAN:   38 y.o. 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 -PET/CT showed fairly active disease. Abdominal disease if progression occurs could potentially cause organ injury. -LDH level and sedimentation rate within normal limits . -HIV negative  -Hepatitis C negative.  -Baseline ECHO from 03/13/17 shows normal heart function, EF 55%-60%  Rpt PET/CT on 05/29/2017 - showed Significant reduction in size and metabolic activity of the formerly extensive adenopathy in the chest and abdomen, with primarily Deauville 3 activity today, and some Deauville 2 activity. Previously this was Deauville 5.  S/p R-CHOP x 6 cycles  PET/CT 08/06/2017: No evidence active lymphoma with minimal activity within the remaining periaortic and mesenteric lymph nodes - Deauville 2  PLAN:  -Discussed pt labwork today, 11/27/18; blood counts are stable. Chemistries are pending. -11/27/18 Sed Rate and LDH areWNL -The pt shows no clinical or lab progression of her nodular lymphocyte predominant Hodgkin's lymphoma at this time.  -No indication for further treatment at this time. -Previously discussed the patient's night sweats in detail, which appearred to align with her Excedrin use. She will also wear lighter clothes at night, will discuss her migraines with her PCP, and will let me know if she has worsened or continued night sweats. -Recommended that the pt  continue to eat well, drink at least 48-64 oz of water each day, and walk 20-30 minutes each day.  -Also discussed the specific goal of walking at least 8000 steps each day, and then increasing to 10000 each day -Will see the pt back in 3 months   3) Patient Active Problem List   Diagnosis Date Noted   Migraine 09/03/2017   Counseling regarding advanced care planning and goals of care 06/24/2017   Nodular lymphocyte predominant Hodgkin lymphoma of lymph nodes of multiple regions ([place]) 03/19/2017   Mesenteric lymphadenopathy 02/11/2017   Adenopathy 01/10/2017   Paraspinal mass 01/07/2017   Abnormal chest CT 01/07/2017   History of panic attacks    Bacterial pharyngitis 07/30/2016   Obesity, Class II, BMI 35-39.9, isolated 04/25/2015  -continue f/u with PCP for other chronic medical issues  4) Anxiety -ativan prn Continue f/u with [facility]  5) 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 -continue taking Vitamin B complex with meals to assist with this.     RTC with Dr [name] in 16months with labs   All of the patients questions were answered with apparent satisfaction. The patient knows to call the clinic with any problems, questions or concerns.  The total time spent in the appt was 25 minutes and more than 50% was on counseling and direct patient cares.   Sullivan Lone MD [place] AAHIVMS [facility] Hematology/Oncology [facility]  (Office):       [phone] (Work cell):  [phone] (Fax):           [phone]  I, Baldwin [place], am acting as a scribe for Dr. Sullivan Lone.   .I have reviewed the above documentation for accuracy and completeness, and I agree with the above. Brunetta Genera MD

## 2018-11-27 ENCOUNTER — Other Ambulatory Visit: Payer: Self-pay

## 2018-11-27 ENCOUNTER — Inpatient Hospital Stay: Payer: 59 | Attending: Hematology

## 2018-11-27 ENCOUNTER — Telehealth: Payer: Self-pay | Admitting: Hematology

## 2018-11-27 ENCOUNTER — Inpatient Hospital Stay (HOSPITAL_BASED_OUTPATIENT_CLINIC_OR_DEPARTMENT_OTHER): Payer: 59 | Admitting: Hematology

## 2018-11-27 VITALS — BP 114/79 | HR 97 | Temp 97.1°F | Resp 18 | Ht 63.0 in | Wt 247.7 lb

## 2018-11-27 DIAGNOSIS — Z803 Family history of malignant neoplasm of breast: Secondary | ICD-10-CM

## 2018-11-27 DIAGNOSIS — F419 Anxiety disorder, unspecified: Secondary | ICD-10-CM | POA: Diagnosis not present

## 2018-11-27 DIAGNOSIS — Z8269 Family history of other diseases of the musculoskeletal system and connective tissue: Secondary | ICD-10-CM | POA: Insufficient documentation

## 2018-11-27 DIAGNOSIS — Z833 Family history of diabetes mellitus: Secondary | ICD-10-CM

## 2018-11-27 DIAGNOSIS — G43909 Migraine, unspecified, not intractable, without status migrainosus: Secondary | ICD-10-CM | POA: Diagnosis not present

## 2018-11-27 DIAGNOSIS — Z8379 Family history of other diseases of the digestive system: Secondary | ICD-10-CM

## 2018-11-27 DIAGNOSIS — Z8249 Family history of ischemic heart disease and other diseases of the circulatory system: Secondary | ICD-10-CM | POA: Diagnosis not present

## 2018-11-27 DIAGNOSIS — C8108 Nodular lymphocyte predominant Hodgkin lymphoma, lymph nodes of multiple sites: Secondary | ICD-10-CM | POA: Insufficient documentation

## 2018-11-27 DIAGNOSIS — R61 Generalized hyperhidrosis: Secondary | ICD-10-CM | POA: Insufficient documentation

## 2018-11-27 DIAGNOSIS — Z79899 Other long term (current) drug therapy: Secondary | ICD-10-CM

## 2018-11-27 DIAGNOSIS — Z806 Family history of leukemia: Secondary | ICD-10-CM | POA: Insufficient documentation

## 2018-11-27 DIAGNOSIS — G629 Polyneuropathy, unspecified: Secondary | ICD-10-CM

## 2018-11-27 DIAGNOSIS — Z820 Family history of epilepsy and other diseases of the nervous system: Secondary | ICD-10-CM

## 2018-11-27 DIAGNOSIS — Z8261 Family history of arthritis: Secondary | ICD-10-CM | POA: Diagnosis not present

## 2018-11-27 LAB — CBC WITH DIFFERENTIAL/PLATELET
Abs Immature Granulocytes: 0.01 10*3/uL (ref 0.00–0.07)
Basophils Absolute: 0 10*3/uL (ref 0.0–0.1)
Basophils Relative: 0 %
Eosinophils Absolute: 0.1 10*3/uL (ref 0.0–0.5)
Eosinophils Relative: 1 %
HCT: 44.4 % (ref 36.0–46.0)
Hemoglobin: 14 g/dL (ref 12.0–15.0)
Immature Granulocytes: 0 %
Lymphocytes Relative: 14 %
Lymphs Abs: 0.8 10*3/uL (ref 0.7–4.0)
MCH: 25.5 pg — ABNORMAL LOW (ref 26.0–34.0)
MCHC: 31.5 g/dL (ref 30.0–36.0)
MCV: 80.7 fL (ref 80.0–100.0)
Monocytes Absolute: 0.6 10*3/uL (ref 0.1–1.0)
Monocytes Relative: 10 %
Neutro Abs: 4 10*3/uL (ref 1.7–7.7)
Neutrophils Relative %: 75 %
Platelets: 217 10*3/uL (ref 150–400)
RBC: 5.5 MIL/uL — ABNORMAL HIGH (ref 3.87–5.11)
RDW: 13.8 % (ref 11.5–15.5)
WBC: 5.5 10*3/uL (ref 4.0–10.5)
nRBC: 0 % (ref 0.0–0.2)

## 2018-11-27 LAB — CMP (CANCER CENTER ONLY)
ALT: 8 U/L (ref 0–44)
AST: 13 U/L — ABNORMAL LOW (ref 15–41)
Albumin: 3.6 g/dL (ref 3.5–5.0)
Alkaline Phosphatase: 50 U/L (ref 38–126)
Anion gap: 7 (ref 5–15)
BUN: 8 mg/dL (ref 6–20)
CO2: 28 mmol/L (ref 22–32)
Calcium: 9.4 mg/dL (ref 8.9–10.3)
Chloride: 105 mmol/L (ref 98–111)
Creatinine: 0.9 mg/dL (ref 0.44–1.00)
GFR, Est AFR Am: >60 mL/min (ref >60)
GFR, Estimated: >60 mL/min (ref >60)
Glucose, Bld: 91 mg/dL (ref 70–99)
Potassium: 4.4 mmol/L (ref 3.5–5.1)
Sodium: 140 mmol/L (ref 135–145)
Total Bilirubin: 0.3 mg/dL (ref 0.3–1.2)
Total Protein: 6.8 g/dL (ref 6.5–8.1)

## 2018-11-27 LAB — SEDIMENTATION RATE: Sed Rate: 9 mm/hr (ref 0–22)

## 2018-11-27 LAB — LACTATE DEHYDROGENASE: LDH: 100 U/L (ref 98–192)

## 2018-11-27 NOTE — Telephone Encounter (Signed)
Scheduled appt per 6/4 los. ° °A calendar will be mailed out. °

## 2019-01-06 ENCOUNTER — Telehealth: Payer: Self-pay | Admitting: *Deleted

## 2019-01-06 NOTE — Telephone Encounter (Signed)
Patient left voice mail requesting call back. Attempted to contact patient. LVM w/office number [phone] and encouraged patient to contact office.

## 2019-02-11 ENCOUNTER — Telehealth: Payer: Self-pay | Admitting: *Deleted

## 2019-02-11 NOTE — Telephone Encounter (Signed)
Patient called. Patient reports following symptoms-- persistant night sweats, increasing over last 3 weeks; pain/ache/burn over right shoulder blade; pain left abd;itchy crawling skin. She has appt on 9/4 for 3 month check up and wanted to know if needed to be seen sooner or make sure ok to wait. Dr.Kale given information.

## 2019-02-13 ENCOUNTER — Telehealth: Payer: Self-pay | Admitting: Hematology

## 2019-02-13 NOTE — Telephone Encounter (Signed)
R/s appt per 8/21 sch message - pt is aware of app date and time

## 2019-02-13 NOTE — Telephone Encounter (Signed)
Per Dr. [name], contacted patient: If there is an earlier opening, he can see her then if she would like to come sooner.  Patient stated that symptoms are not alarming, but she would appreciate an earlier appt if one becomes available. Message sent to scheduling.

## 2019-02-14 NOTE — Progress Notes (Signed)
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   HISTORY OF PRESENTING ILLNESS:  Bonnie Larsen is a wonderful 38 y.o. female who has been referred to [place] by Dr .Patient, No Pcp Per for evaluation and management of thoracic paraspinal masses and upper abdominal extensive lymphadenopathy.  Patient has a history of uterine fibroids, right breast benign lesion biopsied in 2016, anxiety/panic attacks, migraine headaches and chronic sinus issues who presented to the emergency room brought by EMS for chest pain and shortness of breath about 7-10 days ago. She was noted to be hypertensive with a blood pressure of 180/110 which improved with nitroglycerin. She was also having some back pain and felt like her chest pain was radiating to the back. D-dimer level was borderline elevated. She had a CTA of the chest on 01/06/2017 that showed no evidence of pulmonary embolism. She was however noted to have Multiple prevertebral paraspinal masses, differential diagnosis includes extramedullary hematopoiesis and metastasis/lymphadenopathy. Recommend correlation for laboratory analysis for anemia and lymphoproliferative disease.  Patient was subsequently seen by her primary care physician and had a CT of the abdomen and pelvis with contrast on 01/09/2017 which showed Extensive upper abdominal adenopathy favoring lymphoma/lymphoproliferative disease, tissue diagnosis recommended. No splenomegaly or focal splenic lesion.  She was referred to [place] for further evaluation and management.  She was here with her mother and husband.  She notes no abdominal, or chest trauma. No fevers chills night sweats or unexpected weight loss. Mild constipation but no other change in bowel habits.  No GI bleeding noted. No change in food intake or swallowing. No urinary discomfort or abnormalities. Currently having no chest pain or shortness of breath. Mild upper abdominal discomfort. No other obvious peripheral lymphadenopathy noted.   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 The patient's last visit with [place] was on 11/27/2018. The pt reports that she is doing well overall.  The pt reports that she has been working on walking more, up to 7,000 steps per day. She has been having night sweats, which she believed was caused by Sudafed which she since quit taking. She began to have a sore throat and was put on antibiotics, which improved those symptoms. Her night sweats leave her damp but not dripping and she does not sleep under heavy covering. She also had a rash in her midsection and has been experiencing itching. She denies taking hot showers in favor of cooler showers. She has begun to experience back pain and SOB. She describes her lower back pain as a burning sensation. All of these symptoms have happened within a month of each other. Pt has seasonal allergies, which lead her to initially take the Sudafed but denies taking any antihistamines at this time. Pt denies weight loss and reports that she has been drinking water and reducing sodas. She has also been using a humidifier sporadically to improve her sinus symptoms. Although pt has the Mirena IUD she has been experiencing spotting immediately after intercourse, which is abnormal for her. Her anxiety has also improved lately.   Lab results today (02/16/19) of CBC w/diff and CMP is  as follows: all values are WNL except for RBC at 5.82, HCT at 46.8, MCH at 25.6, Glucose at 104, Creatinine at 1.01.  02/16/2019 LDH is at 140. 02/16/2019 Sed Rate is at 5.   On review of systems, pt reports  night sweats, lower back pain, itching, rash, SOB, abdominal pain on her right side and denies fever, chills, weight loss and any other symptoms.    MEDICAL HISTORY:  Past Medical History:  Diagnosis Date  . Anxiety   . Difficult intravenous access   . Family history of adverse reaction to anesthesia    pt's mother has hx. of post-op N/V and hard to wake up post-op  . History of panic attacks    02/08/17- has not had a panic attacks  . Lymphoma ([place]) 01/2017  . Mesenteric lymphadenopathy 01/2017  . Morbid obesity with BMI of 40.0-44.9, adult ([place])   . Runny nose 02/19/2017   clear drainage, per pt.  . Seasonal allergies     SURGICAL HISTORY: Past Surgical History:  Procedure Laterality Date  . LYMPH NODE BIOPSY N/A 02/11/2017   Procedure: HAND ASSISTED LAPAROSCOPIC LYMPH NODE BIOPSY;  Surgeon: Stark Klein, MD;  Location: [facility];  Service: General;  Laterality: N/A;  . PORT-A-CATH REMOVAL N/A 09/04/2017   Procedure: REMOVAL PORT-A-CATH;  Surgeon: Stark Klein, MD;  Location: [facility];  Service: General;  Laterality: N/A;  . PORTACATH PLACEMENT N/A 02/20/2017   Procedure: INSERTION PORT-A-CATH;  Surgeon: Stark Klein, MD;  Location: [facility];  Service: General;  Laterality: N/A;  . TUBAL LIGATION    . UMBILICAL HERNIA REPAIR  02/11/2017    SOCIAL HISTORY: Social History   Socioeconomic History  . Marital status: Married    Spouse name: Gwyndolyn Saxon  . Number of children: 2  . Years of education: Not on file  . Highest education level: Associate degree: occupational, Hotel manager, or vocational program  Occupational History  . Not on file  Social Needs  . Financial resource strain: Not hard at all  . Food insecurity    Worry: Never true    Inability: Never true  . Transportation needs    Medical: No    Non-medical: No  Tobacco Use  . Smoking status: Never Smoker  . Smokeless tobacco: Never Used  Substance and Sexual Activity  . Alcohol use: No     Alcohol/week: 0.0 standard drinks  . Drug use: No  . Sexual activity: Yes    Partners: Male    Birth control/protection: I.U.D.    Comment: tubal ligation  Lifestyle  . Physical activity    Days per week: 0 days    Minutes per session: 0 min  . Stress: Rather much  Relationships  . Social Herbalist on phone: More than three times a week    Gets together: Never    Attends religious service: Never    Active member of club or organization: No    Attends meetings of clubs or organizations: Never    Relationship status: Married  . Intimate partner violence    Fear of current or ex partner: No    Emotionally abused: No    Physically abused: No    Forced sexual activity: No  Other Topics Concern  . Not on file  Social History Narrative  . Not on file    FAMILY HISTORY: Family History  Problem Relation Age of Onset  . Breast cancer Mother 39  . Fibromyalgia Mother   . Rheum arthritis  Mother   . Neuropathy Mother   . Anesthesia problems Mother        post-op N/V; hard to wake up post-op  . Congestive Heart Failure Father        died at age 90  . Cirrhosis Father   . Heart disease Maternal Grandmother   . Diabetes Maternal Grandmother   . Other Brother        Benign spinal tumor  . Cancer Paternal Grandfather        Leukemia    ALLERGIES:  is allergic to clindamycin/lincomycin and cymbalta [duloxetine hcl].  MEDICATIONS:  Current Outpatient Medications  Medication Sig Dispense Refill  . amoxicillin-clavulanate (AUGMENTIN) 875-125 MG tablet Take 1 tablet by mouth 2 (two) times daily. 14 tablet 0  . Biotin 10 MG TABS Take by mouth.    . levonorgestrel (MIRENA) 20 MCG/24HR IUD 1 each by Intrauterine route once.    . Multiple Vitamin (MULTIVITAMIN) tablet Take 1 tablet by mouth daily.     No current facility-administered medications for this visit.    Facility-Administered Medications Ordered in Other Visits  Medication Dose Route Frequency Provider Last  Rate Last Dose  . sodium chloride flush (NS) 0.9 % injection 10 mL  10 mL Intracatheter PRN Brunetta Genera, MD   10 mL at 03/26/17 1724  . sodium chloride flush (NS) 0.9 % injection 10 mL  10 mL Intravenous PRN Brunetta Genera, MD   10 mL at 04/16/17 1938    REVIEW OF SYSTEMS:   A 10+ POINT REVIEW OF SYSTEMS WAS OBTAINED including neurology, dermatology, psychiatry, cardiac, respiratory, lymph, extremities, GI, GU, Musculoskeletal, constitutional, breasts, reproductive, HEENT.  All pertinent positives are noted in the HPI.  All others are negative.   PHYSICAL EXAMINATION:  ECOG PERFORMANCE STATUS: 1 - Symptomatic but completely ambulatory  Vitals:   02/16/19 0905  BP: 119/73  Pulse: (!) 110  Resp: 18  Temp: 98.5 F (36.9 C)  SpO2: 100%   Filed Weights   02/16/19 0905  Weight: 254 lb (115.2 kg)   .Body mass index is 44.99 kg/m.   GENERAL:alert, in no acute distress and comfortable SKIN: no acute rashes, no significant lesions EYES: conjunctiva are pink and non-injected, sclera anicteric OROPHARYNX: MMM, no exudates, no oropharyngeal erythema or ulceration NECK: supple, no JVD LYMPH:  no palpable lymphadenopathy in the cervical, axillary or inguinal regions LUNGS: clear to auscultation b/l with normal respiratory effort HEART: regular rate & rhythm ABDOMEN:  normoactive bowel sounds, not distended. Deep tenderness.  Extremity: no pedal edema PSYCH: alert & oriented x 3 with fluent speech NEURO: no focal motor/sensory deficits   LABORATORY DATA:  I have reviewed the data as listed   . CBC Latest Ref Rng & Units 02/16/2019 11/27/2018 08/26/2018  WBC 4.0 - 10.5 K/uL 4.5 5.5 4.4  Hemoglobin 12.0 - 15.0 g/dL 14.9 14.0 14.1  Hematocrit 36.0 - 46.0 % 46.8(H) 44.4 45.1  Platelets 150 - 400 K/uL 239 217 253     . CMP Latest Ref Rng & Units 02/16/2019 11/27/2018 08/26/2018  Glucose 70 - 99 mg/dL 104(H) 91 109(H)  BUN 6 - 20 mg/dL 13 8 12   Creatinine 0.44 - 1.00 mg/dL  1.01(H) 0.90 1.00  Sodium 135 - 145 mmol/L 142 140 143  Potassium 3.5 - 5.1 mmol/L 4.2 4.4 4.3  Chloride 98 - 111 mmol/L 105 105 105  CO2 22 - 32 mmol/L 26 28 27   Calcium 8.9 - 10.3 mg/dL 9.7 9.4 9.5  Total  Protein 6.5 - 8.1 g/dL 7.3 6.8 7.2  Total Bilirubin 0.3 - 1.2 mg/dL 0.6 0.3 0.3  Alkaline Phos 38 - 126 U/L 60 50 57  AST 15 - 41 U/L 19 13(L) 15  ALT 0 - 44 U/L 16 8 12    . Lab Results  Component Value Date   LDH 140 02/16/2019    Lymphnode biopsy, 02/11/2017 Diagnosis Lymph node for lymphoma, Mesenteric Lymphadenopathy - HODGKIN LYMPHOMA.         PROCEDURES  ECHO 03/13/17 Study Conclusions - Left ventricle: The cavity size was normal. Systolic function was  normal. The estimated ejection fraction was in the range of 55%  to 60%. Wall motion was normal; there were no regional wall  motion abnormalities. Left ventricular diastolic function  parameters were normal. - Atrial septum: No defect or patent foramen ovale was identified. - Impressions: Normal GLS -20.2.   RADIOGRAPHIC STUDIES: I have personally reviewed the radiological images as listed and agreed with the findings in the report. No results found.    ASSESSMENT & PLAN:   38 y.o. 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 -PET/CT showed fairly active disease. Abdominal disease if progression occurs could potentially cause organ injury. -LDH level and sedimentation rate within normal limits . -HIV negative  -Hepatitis C negative.  -Baseline ECHO from 03/13/17 shows normal heart function, EF 55%-60%  Rpt PET/CT on 05/29/2017 - showed Significant reduction in size and metabolic activity of the formerly extensive adenopathy in the chest and  abdomen, with primarily Deauville 3 activity today, and some Deauville 2 activity. Previously this was Deauville 5.  S/p R-CHOP x 6 cycles  PET/CT 08/06/2017: No evidence active lymphoma with minimal activity within the remaining periaortic and mesenteric lymph nodes - Deauville 2  PLAN:   A&P: -Discussed pt labwork today, 02/16/19; blood counts look good, blood chemistries are stable. -Discussed 02/16/2019 LDH is at 140.   -Discussed 02/16/2019 Sed Rate is at 5 - wnl -Discussed the potential viral, hormonal changes or allergic cause of her symptoms.  -Discussed PCP testing hormones to determine if pt is close to menopause. -Plan on getting CT Chest/Abd/Pel to r/o the return of lymphoma.  -The pt shows no clinical or lab progression of her nodular lymphocyte predominant Hodgkin's lymphoma at this time.  -Recommended that the pt continue to eat well, drink at least 48-64 oz of water each day, and walk 20-30 minutes each day.  -Also discussed the specific goal of walking at least 8000 steps each day, and then increasing to 10000 each day -Will f/u in 10 days via phone.   3) Patient Active Problem List   Diagnosis Date Noted  . Migraine 09/03/2017  . Counseling regarding advanced care planning and goals of care 06/24/2017  . Nodular lymphocyte predominant Hodgkin lymphoma of lymph nodes of multiple regions ([place]) 03/19/2017  . Mesenteric lymphadenopathy 02/11/2017  . Adenopathy 01/10/2017  . 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 Abnormal chest CT 01/07/2017  . History of panic attacks   . Bacterial pharyngitis 07/30/2016  . 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 -continue taking Vitamin B complex with meals  to assist with this.     FOLLOW-UP: CT chest/abd/pelvis in 1 week Phone visit in 10  days  All of the patients questions were answered with apparent satisfaction. The patient knows to call the clinic with any problems, questions or concerns.  The total time spent in the appt was 20 minutes and more than 50% was on counseling and direct patient cares.  All of the patient's questions were answered with apparent satisfaction. The patient knows to call the clinic with any problems, questions or concerns.   Sullivan Lone MD [place] AAHIVMS [facility] Hematology/Oncology Physician [facility]  (Office):       [phone] (Work cell):  [phone] (Fax):           [phone]  I, Yevette Edwards, am acting as a scribe for Dr. Sullivan Lone.   .I have reviewed the above documentation for accuracy and completeness, and I agree with the above. Brunetta Genera MD

## 2019-02-16 ENCOUNTER — Other Ambulatory Visit: Payer: Self-pay

## 2019-02-16 ENCOUNTER — Inpatient Hospital Stay: Payer: 59 | Attending: Hematology

## 2019-02-16 ENCOUNTER — Inpatient Hospital Stay (HOSPITAL_BASED_OUTPATIENT_CLINIC_OR_DEPARTMENT_OTHER): Payer: 59 | Admitting: Hematology

## 2019-02-16 ENCOUNTER — Telehealth: Payer: Self-pay | Admitting: Hematology

## 2019-02-16 VITALS — BP 119/73 | HR 110 | Temp 98.5°F | Resp 18 | Ht 63.0 in | Wt 254.0 lb

## 2019-02-16 DIAGNOSIS — L299 Pruritus, unspecified: Secondary | ICD-10-CM | POA: Insufficient documentation

## 2019-02-16 DIAGNOSIS — R61 Generalized hyperhidrosis: Secondary | ICD-10-CM

## 2019-02-16 DIAGNOSIS — R109 Unspecified abdominal pain: Secondary | ICD-10-CM

## 2019-02-16 DIAGNOSIS — R21 Rash and other nonspecific skin eruption: Secondary | ICD-10-CM | POA: Insufficient documentation

## 2019-02-16 DIAGNOSIS — R0602 Shortness of breath: Secondary | ICD-10-CM | POA: Insufficient documentation

## 2019-02-16 DIAGNOSIS — F419 Anxiety disorder, unspecified: Secondary | ICD-10-CM | POA: Diagnosis not present

## 2019-02-16 DIAGNOSIS — C8108 Nodular lymphocyte predominant Hodgkin lymphoma, lymph nodes of multiple sites: Secondary | ICD-10-CM | POA: Diagnosis present

## 2019-02-16 DIAGNOSIS — M545 Low back pain: Secondary | ICD-10-CM | POA: Diagnosis not present

## 2019-02-16 LAB — LACTATE DEHYDROGENASE: LDH: 140 U/L (ref 98–192)

## 2019-02-16 LAB — CBC WITH DIFFERENTIAL/PLATELET
Abs Immature Granulocytes: 0.01 10*3/uL (ref 0.00–0.07)
Basophils Absolute: 0 10*3/uL (ref 0.0–0.1)
Basophils Relative: 0 %
Eosinophils Absolute: 0 10*3/uL (ref 0.0–0.5)
Eosinophils Relative: 1 %
HCT: 46.8 % — ABNORMAL HIGH (ref 36.0–46.0)
Hemoglobin: 14.9 g/dL (ref 12.0–15.0)
Immature Granulocytes: 0 %
Lymphocytes Relative: 20 %
Lymphs Abs: 0.9 10*3/uL (ref 0.7–4.0)
MCH: 25.6 pg — ABNORMAL LOW (ref 26.0–34.0)
MCHC: 31.8 g/dL (ref 30.0–36.0)
MCV: 80.4 fL (ref 80.0–100.0)
Monocytes Absolute: 0.4 10*3/uL (ref 0.1–1.0)
Monocytes Relative: 8 %
Neutro Abs: 3.2 10*3/uL (ref 1.7–7.7)
Neutrophils Relative %: 71 %
Platelets: 239 10*3/uL (ref 150–400)
RBC: 5.82 MIL/uL — ABNORMAL HIGH (ref 3.87–5.11)
RDW: 13.8 % (ref 11.5–15.5)
WBC: 4.5 10*3/uL (ref 4.0–10.5)
nRBC: 0 % (ref 0.0–0.2)

## 2019-02-16 LAB — CMP (CANCER CENTER ONLY)
ALT: 16 U/L (ref 0–44)
AST: 19 U/L (ref 15–41)
Albumin: 4.2 g/dL (ref 3.5–5.0)
Alkaline Phosphatase: 60 U/L (ref 38–126)
Anion gap: 11 (ref 5–15)
BUN: 13 mg/dL (ref 6–20)
CO2: 26 mmol/L (ref 22–32)
Calcium: 9.7 mg/dL (ref 8.9–10.3)
Chloride: 105 mmol/L (ref 98–111)
Creatinine: 1.01 mg/dL — ABNORMAL HIGH (ref 0.44–1.00)
GFR, Est AFR Am: >60 mL/min (ref >60)
GFR, Estimated: >60 mL/min (ref >60)
Glucose, Bld: 104 mg/dL — ABNORMAL HIGH (ref 70–99)
Potassium: 4.2 mmol/L (ref 3.5–5.1)
Sodium: 142 mmol/L (ref 135–145)
Total Bilirubin: 0.6 mg/dL (ref 0.3–1.2)
Total Protein: 7.3 g/dL (ref 6.5–8.1)

## 2019-02-16 LAB — SEDIMENTATION RATE: Sed Rate: 5 mm/hr (ref 0–22)

## 2019-02-16 NOTE — Telephone Encounter (Signed)
Scheduled appt per 8/24 los.  Printed avs and gave the patient some contrast and the number to central radiology

## 2019-02-23 ENCOUNTER — Other Ambulatory Visit: Payer: Self-pay

## 2019-02-23 ENCOUNTER — Ambulatory Visit (HOSPITAL_COMMUNITY)
Admission: RE | Admit: 2019-02-23 | Discharge: 2019-02-23 | Disposition: A | Discharge status: Home / Self Care | Payer: 59 | Source: Ambulatory Visit | Attending: Hematology | Admitting: Hematology

## 2019-02-23 DIAGNOSIS — C8108 Nodular lymphocyte predominant Hodgkin lymphoma, lymph nodes of multiple sites: Secondary | ICD-10-CM

## 2019-02-23 DIAGNOSIS — R61 Generalized hyperhidrosis: Secondary | ICD-10-CM | POA: Insufficient documentation

## 2019-02-23 DIAGNOSIS — R109 Unspecified abdominal pain: Secondary | ICD-10-CM | POA: Diagnosis present

## 2019-02-23 MED ORDER — SODIUM CHLORIDE (PF) 0.9 % IJ SOLN
INTRAMUSCULAR | Status: AC
  Filled 2019-02-23: qty 50

## 2019-02-23 MED ORDER — IOHEXOL 300 MG/ML  SOLN
100.0000 mL | Freq: Once | INTRAMUSCULAR | Status: AC | PRN
  Administered 2019-02-23: 100 mL via INTRAVENOUS

## 2019-02-24 ENCOUNTER — Telehealth: Payer: Self-pay | Admitting: *Deleted

## 2019-02-24 NOTE — Telephone Encounter (Signed)
Called and left voice mail to confirm her appt on Thursday - she thought it was a phone visit, but the reminder call did not specify. Attempted to contact her, left voice mail: appt on 9/3 at 0920 is a phone visit. Encouraged to contact office for questions.

## 2019-02-25 ENCOUNTER — Telehealth: Payer: Self-pay | Admitting: Hematology

## 2019-02-25 NOTE — Telephone Encounter (Signed)
Left message for to verify telephone visit for pre reg

## 2019-02-25 NOTE — Progress Notes (Signed)
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   HISTORY OF PRESENTING ILLNESS:  Bonnie Larsen is a wonderful 38 y.o. female who has been referred to [place] by Dr .Domingo Madeira, [facility] for evaluation and management of thoracic paraspinal masses and upper abdominal extensive lymphadenopathy.  Patient has a history of uterine fibroids, right breast benign lesion biopsied in 2016, anxiety/panic attacks, migraine headaches and chronic sinus issues who presented to the emergency room brought by EMS for chest pain and shortness of breath about 7-10 days ago. She was noted to be hypertensive with a blood pressure of 180/110 which improved with nitroglycerin. She was also having some back pain and felt like her chest pain was radiating to the back. D-dimer level was borderline elevated. She had a CTA of the chest on 01/06/2017 that showed no evidence of pulmonary embolism. She was however noted to have Multiple prevertebral paraspinal masses, differential diagnosis includes extramedullary hematopoiesis and metastasis/lymphadenopathy. Recommend correlation for laboratory analysis for anemia and lymphoproliferative disease.  Patient was subsequently seen by her primary care physician and had a CT of the abdomen and pelvis with contrast on 01/09/2017 which showed Extensive upper abdominal adenopathy favoring lymphoma/lymphoproliferative disease, tissue diagnosis recommended. No splenomegaly or focal splenic lesion.  She was referred to [place] for further evaluation and management.  She was here with her mother and husband.  She notes no abdominal, or chest trauma. No fevers chills night sweats or unexpected weight loss. Mild constipation but no other change in bowel habits. No GI  bleeding noted. No change in food intake or swallowing. No urinary discomfort or abnormalities. Currently having no chest pain or shortness of breath. Mild upper abdominal discomfort. No other obvious peripheral lymphadenopathy noted.   PREVIOUS THERAPY:    R-CHOP x 6 cycles (Fanale et al 2010)   INTERVAL HISTORY  I connected with [name] on 02/25/19 at  9:20 AM EDT by telephone visit and verified that I am speaking with the correct person using two identifiers.   I discussed the limitations, risks, security and privacy concerns of performing an evaluation and management service by telemedicine and the availability of in-person appointments. I also discussed with the patient that there may be a patient responsible charge related to this service. The patient expressed understanding and agreed to proceed.   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 The patient's last visit with [place] was on 02/16/2019. The pt reports that she is doing well overall.  The pt reports no acute new symptoms.  Of note since the patient's last visit, pt has had a chest, abdomen, and pelvis CT with contrast completed on 02/23/2019 with results revealing "12 mm aortocaval lymph node appears new. Otherwise, small to borderline enlarged lymph nodes in the chest and abdomen are unchanged.".  Lab results today (02/16/2019) of CBC w/diff and CMP is as follows: all results are WNL except for RBC at 5.82, HCT at 46.8, MCH at 25.6, Glucose at 104, and Creatinine at 1.01.  02/16/2019 LDH is at 140.   02/16/2019 Sed Rate is at 5 - wnl  On review of systems, pt reports no abdominal pain, fevers,  chills or nightsweats.Marland Kitchen    MEDICAL HISTORY:  Past Medical History:  Diagnosis Date   Anxiety    Difficult intravenous access    Family history of adverse reaction to anesthesia    pt's mother has hx. of post-op N/V and hard to wake up post-op   History of panic attacks    02/08/17- has not had a panic attacks   Lymphoma ([place]) 01/2017   Mesenteric lymphadenopathy 01/2017   Morbid obesity with BMI of 40.0-44.9, adult ([place])    Runny nose 02/19/2017   clear drainage, per pt.   Seasonal allergies     SURGICAL HISTORY: Past Surgical History:  Procedure Laterality Date   LYMPH NODE BIOPSY N/A 02/11/2017   Procedure: HAND ASSISTED LAPAROSCOPIC LYMPH NODE BIOPSY;  Surgeon: Stark Klein, MD;  Location: [place];  Service: General;  Laterality: N/A;   PORT-A-CATH REMOVAL N/A 09/04/2017   Procedure: REMOVAL PORT-A-CATH;  Surgeon: Stark Klein, MD;  Location: [place];  Service: General;  Laterality: N/A;   PORTACATH PLACEMENT N/A 02/20/2017   Procedure: INSERTION PORT-A-CATH;  Surgeon: Stark Klein, MD;  Location: [place];  Service: General;  Laterality: N/A;   TUBAL LIGATION     UMBILICAL HERNIA REPAIR  02/11/2017    SOCIAL HISTORY: Social History   Socioeconomic History   Marital status: Married    Spouse name: Gwyndolyn Saxon   Number of children: 2   Years of education: Not on file   Highest education level: Associate degree: occupational, Hotel manager, or vocational program  Occupational History   Not on file  Social Needs   Financial resource strain: Not hard at all   Food insecurity    Worry: Never true    Inability: Never true   Transportation needs    Medical: No    Non-medical: No  Tobacco Use   Smoking status: Never Smoker   Smokeless tobacco: Never Used  Substance and Sexual Activity   Alcohol use: No    Alcohol/week: 0.0 standard drinks   Drug use: No   Sexual activity: Yes    Partners: Male    Birth control/protection: I.U.D.      Comment: tubal ligation  Lifestyle   Physical activity    Days per week: 0 days    Minutes per session: 0 min   Stress: Rather much  Relationships   Social connections    Talks on phone: More than three times a week    Gets together: Never    Attends religious service: Never    Active member of club or organization: No    Attends meetings of clubs or organizations: Never    Relationship status: Married   Intimate partner violence    Fear of current or ex partner: No    Emotionally abused: No    Physically abused: No    Forced sexual activity: No  Other Topics Concern   Not on file  Social History Narrative   Not on file    FAMILY HISTORY: Family History  Problem Relation Age of Onset   Breast cancer Mother 26   Fibromyalgia Mother    Rheum arthritis Mother    Neuropathy Mother    Anesthesia problems Mother        post-op N/V; hard to wake up post-op   Congestive Heart Failure Father  died at age 80   Cirrhosis Father    Heart disease Maternal Grandmother    Diabetes Maternal Grandmother    Other Brother        Benign spinal tumor   Cancer Paternal Grandfather        Leukemia    ALLERGIES:  is allergic to clindamycin/lincomycin and cymbalta [duloxetine hcl].  MEDICATIONS:  Current Outpatient Medications  Medication Sig Dispense Refill   amoxicillin-clavulanate (AUGMENTIN) 875-125 MG tablet Take 1 tablet by mouth 2 (two) times daily. 14 tablet 0   Biotin 10 MG TABS Take by mouth.     levonorgestrel (MIRENA) 20 MCG/24HR IUD 1 each by Intrauterine route once.     Multiple Vitamin (MULTIVITAMIN) tablet Take 1 tablet by mouth daily.     No current facility-administered medications for this visit.    Facility-Administered Medications Ordered in Other Visits  Medication Dose Route Frequency Provider Last Rate Last Dose   sodium chloride flush (NS) 0.9 % injection 10 mL  10 mL Intracatheter PRN Brunetta Genera, MD   10 mL at  03/26/17 1724   sodium chloride flush (NS) 0.9 % injection 10 mL  10 mL Intravenous PRN Brunetta Genera, MD   10 mL at 04/16/17 1938    REVIEW OF SYSTEMS:  A 10+ POINT REVIEW OF SYSTEMS WAS OBTAINED including neurology, dermatology, psychiatry, cardiac, respiratory, lymph, extremities, GI, GU, Musculoskeletal, constitutional, breasts, reproductive, HEENT.  All pertinent positives are noted in the HPI.  All others are negative.    PHYSICAL EXAMINATION:  ECOG PERFORMANCE STATUS: 1 - Symptomatic but completely ambulatory  There were no vitals filed for this visit. There were no vitals filed for this visit. There is no height or weight on file to calculate BMI.  Telehealth Visit   LABORATORY DATA:  I have reviewed the data as listed   . CBC Latest Ref Rng & Units 02/16/2019 11/27/2018 08/26/2018  WBC 4.0 - 10.5 K/uL 4.5 5.5 4.4  Hemoglobin 12.0 - 15.0 g/dL 14.9 14.0 14.1  Hematocrit 36.0 - 46.0 % 46.8(H) 44.4 45.1  Platelets 150 - 400 K/uL 239 217 253     . CMP Latest Ref Rng & Units 02/16/2019 11/27/2018 08/26/2018  Glucose 70 - 99 mg/dL 104(H) 91 109(H)  BUN 6 - 20 mg/dL 13 8 12   Creatinine 0.44 - 1.00 mg/dL 1.01(H) 0.90 1.00  Sodium 135 - 145 mmol/L 142 140 143  Potassium 3.5 - 5.1 mmol/L 4.2 4.4 4.3  Chloride 98 - 111 mmol/L 105 105 105  CO2 22 - 32 mmol/L 26 28 27   Calcium 8.9 - 10.3 mg/dL 9.7 9.4 9.5  Total Protein 6.5 - 8.1 g/dL 7.3 6.8 7.2  Total Bilirubin 0.3 - 1.2 mg/dL 0.6 0.3 0.3  Alkaline Phos 38 - 126 U/L 60 50 57  AST 15 - 41 U/L 19 13(L) 15  ALT 0 - 44 U/L 16 8 12    . Lab Results  Component Value Date   LDH 140 02/16/2019    Lymphnode biopsy, 02/11/2017 Diagnosis Lymph node for lymphoma, Mesenteric Lymphadenopathy - HODGKIN LYMPHOMA.         PROCEDURES  ECHO 03/13/17 Study Conclusions - Left ventricle: The cavity size was normal. Systolic function was  normal. The estimated ejection fraction was in the range of 55%  to 60%. Wall motion was  normal; there were no regional wall  motion abnormalities. Left ventricular diastolic function  parameters were normal. - Atrial septum: No defect or patent foramen ovale was  identified. - Impressions: Normal GLS -20.2.   RADIOGRAPHIC STUDIES: I have personally reviewed the radiological images as listed and agreed with the findings in the report. Ct Chest W Contrast  Result Date: 02/23/2019 CLINICAL DATA:  Lymphoma. New night sweats, abdominal discomfort and itching. EXAM: CT CHEST, ABDOMEN, AND PELVIS WITH CONTRAST TECHNIQUE: Multidetector CT imaging of the chest, abdomen and pelvis was performed following the standard protocol during bolus administration of intravenous contrast. CONTRAST:  11mL OMNIPAQUE IOHEXOL 300 MG/ML  SOLN COMPARISON:  CT abdomen pelvis 11/14/2017, PET 08/06/2017 in CT chest 01/06/2017. FINDINGS: CT CHEST FINDINGS Cardiovascular: Vascular structures are unremarkable. Heart size normal. No pericardial effusion. Mediastinum/Nodes: Periesophageal lymph nodes measure up to 8 mm (2/44), similar to 08/06/2017. Otherwise, no pathologically enlarged mediastinal, hilar or axillary lymph nodes. Lungs/Pleura: Lungs are clear. No pleural fluid. Airway is unremarkable. Musculoskeletal: Degenerative changes in the spine. No worrisome lytic or sclerotic lesions. CT ABDOMEN PELVIS FINDINGS Hepatobiliary: Liver and gallbladder are unremarkable. No biliary ductal dilatation. Pancreas: Negative. Spleen: Negative. Adrenals/Urinary Tract: Adrenal glands are unremarkable. Right kidney appears non rotated. Duplicated right renal collecting system. Left kidney is unremarkable. Ureters are decompressed. Bladder is grossly unremarkable. Stomach/Bowel: Stomach, small bowel, appendix and colon are unremarkable. Vascular/Lymphatic: Vascular structures are unremarkable. Borderline enlarged lymph nodes are seen in the abdominal peritoneal ligaments, retroperitoneum and small bowel mesentery. Index gastrohepatic  ligament lymph node measures 10 mm (2/55), stable from 11/14/2017. Index left periaortic lymph node measures 10 mm (2/65), also stable. Index lymph node at the root of the small bowel mesentery measures 12 mm (2/64), also stable. There does appear to be one new 12 mm lymph node in the aortocaval region (2/77). Reproductive: Intrauterine contraceptive device is in place. No adnexal mass. Other: No free fluid. Laxity of the ventral abdominal wall at the umbilicus. Mesenteries and peritoneum are otherwise unremarkable. Musculoskeletal: No worrisome lytic or sclerotic lesions. IMPRESSION: 1. 12 mm aortocaval lymph node appears new. Otherwise, small to borderline enlarged lymph nodes in the chest and abdomen are unchanged. Electronically Signed   By: Lorin Picket M.D.   On: 02/23/2019 14:28   Ct Abdomen Pelvis W Contrast  Result Date: 02/23/2019 CLINICAL DATA:  Lymphoma. New night sweats, abdominal discomfort and itching. EXAM: CT CHEST, ABDOMEN, AND PELVIS WITH CONTRAST TECHNIQUE: Multidetector CT imaging of the chest, abdomen and pelvis was performed following the standard protocol during bolus administration of intravenous contrast. CONTRAST:  163mL OMNIPAQUE IOHEXOL 300 MG/ML  SOLN COMPARISON:  CT abdomen pelvis 11/14/2017, PET 08/06/2017 in CT chest 01/06/2017. FINDINGS: CT CHEST FINDINGS Cardiovascular: Vascular structures are unremarkable. Heart size normal. No pericardial effusion. Mediastinum/Nodes: Periesophageal lymph nodes measure up to 8 mm (2/44), similar to 08/06/2017. Otherwise, no pathologically enlarged mediastinal, hilar or axillary lymph nodes. Lungs/Pleura: Lungs are clear. No pleural fluid. Airway is unremarkable. Musculoskeletal: Degenerative changes in the spine. No worrisome lytic or sclerotic lesions. CT ABDOMEN PELVIS FINDINGS Hepatobiliary: Liver and gallbladder are unremarkable. No biliary ductal dilatation. Pancreas: Negative. Spleen: Negative. Adrenals/Urinary Tract: Adrenal glands  are unremarkable. Right kidney appears non rotated. Duplicated right renal collecting system. Left kidney is unremarkable. Ureters are decompressed. Bladder is grossly unremarkable. Stomach/Bowel: Stomach, small bowel, appendix and colon are unremarkable. Vascular/Lymphatic: Vascular structures are unremarkable. Borderline enlarged lymph nodes are seen in the abdominal peritoneal ligaments, retroperitoneum and small bowel mesentery. Index gastrohepatic ligament lymph node measures 10 mm (2/55), stable from 11/14/2017. Index left periaortic lymph node measures 10 mm (2/65), also stable. Index lymph node at the root  of the small bowel mesentery measures 12 mm (2/64), also stable. There does appear to be one new 12 mm lymph node in the aortocaval region (2/77). Reproductive: Intrauterine contraceptive device is in place. No adnexal mass. Other: No free fluid. Laxity of the ventral abdominal wall at the umbilicus. Mesenteries and peritoneum are otherwise unremarkable. Musculoskeletal: No worrisome lytic or sclerotic lesions. IMPRESSION: 1. 12 mm aortocaval lymph node appears new. Otherwise, small to borderline enlarged lymph nodes in the chest and abdomen are unchanged. Electronically Signed   By: Lorin Picket M.D.   On: 02/23/2019 14:28      ASSESSMENT & PLAN:   38 y.o. 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 -PET/CT showed fairly active disease. Abdominal disease if progression occurs could potentially cause organ injury. -LDH level and sedimentation rate within normal limits . -HIV negative  -Hepatitis C negative.  -Baseline ECHO from 03/13/17 shows normal heart function, EF 55%-60%  Rpt PET/CT on 05/29/2017 - showed Significant reduction  in size and metabolic activity of the formerly extensive adenopathy in the chest and abdomen, with primarily Deauville 3 activity today, and some Deauville 2 activity. Previously this was Deauville 5.  S/p R-CHOP x 6 cycles  PET/CT 08/06/2017: No evidence active lymphoma with minimal activity within the remaining periaortic and mesenteric lymph nodes - Deauville 2   3) Patient Active Problem List   Diagnosis Date Noted   Migraine 09/03/2017   Counseling regarding advanced care planning and goals of care 06/24/2017   Nodular lymphocyte predominant Hodgkin lymphoma of lymph nodes of multiple regions ([place]) 03/19/2017   Mesenteric lymphadenopathy 02/11/2017   Adenopathy 01/10/2017   Paraspinal mass 01/07/2017   Abnormal chest CT 01/07/2017   History of panic attacks    Bacterial pharyngitis 07/30/2016   Obesity, Class II, BMI 35-39.9, isolated 04/25/2015  -continue f/u with PCP for other chronic medical issues  4) Anxiety -ativan prn Continue f/u with [facility]  5) 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 -continue taking Vitamin B complex with meals to assist with this.     PLAN: -Discussed pt labwork today, 02/16/19; all results are WNL except for RBC at 5.82, HCT at 46.8, MCH at 25.6, Glucose at 104, and Creatinine at 1.01.  -Discussed 02/16/2019 LDH is at 140.   -Discussed 02/16/2019 Sed Rate is at 5 - wnl -Discussed 02/23/2019 chest, abdomen, and pelvis CT with contrast with results revealing "12 mm aortocaval lymph node appears new. Otherwise, small to borderline enlarged lymph nodes in the chest and abdomen are unchanged.". -will monitor with clinical f/u and labs in 3 months and rpt imaging in 6 month to re-evaluate for true progression.  FOLLOW UP: RTC with Dr [name] with labs in 3 months   The total time spent in the appt was 20 minutes and more than 50% was on counseling and direct patient cares.  All of the patient's questions were  answered with apparent satisfaction. The patient knows to call the clinic with any problems, questions or concerns.    Sullivan Lone MD MS AAHIVMS [facility] Hematology/Oncology Physician [facility]  (Office):       [phone] (Work cell):  [phone] (Fax):           [phone]  I, Jacqualyn Posey, am acting as  a scribe for Dr. Sullivan Lone.   .I have reviewed the above documentation for accuracy and completeness, and I agree with the above. Brunetta Genera MD

## 2019-02-26 ENCOUNTER — Inpatient Hospital Stay: Payer: 59 | Attending: Hematology | Admitting: Hematology

## 2019-02-26 ENCOUNTER — Telehealth: Payer: Self-pay | Admitting: Hematology

## 2019-02-26 DIAGNOSIS — C8108 Nodular lymphocyte predominant Hodgkin lymphoma, lymph nodes of multiple sites: Secondary | ICD-10-CM

## 2019-02-26 DIAGNOSIS — F419 Anxiety disorder, unspecified: Secondary | ICD-10-CM

## 2019-02-26 NOTE — Telephone Encounter (Signed)
Scheduled appt per 9/3 los.  Spoke with patient and she is aware of the appt date and time.

## 2019-02-27 ENCOUNTER — Other Ambulatory Visit: Payer: 59

## 2019-02-27 ENCOUNTER — Ambulatory Visit: Payer: 59 | Admitting: Hematology

## 2019-05-29 ENCOUNTER — Inpatient Hospital Stay (HOSPITAL_BASED_OUTPATIENT_CLINIC_OR_DEPARTMENT_OTHER): Payer: 59 | Admitting: Hematology

## 2019-05-29 ENCOUNTER — Other Ambulatory Visit: Payer: Self-pay

## 2019-05-29 ENCOUNTER — Inpatient Hospital Stay: Payer: 59 | Attending: Hematology

## 2019-05-29 VITALS — BP 112/85 | HR 80 | Temp 98.0°F | Resp 16 | Ht 63.0 in | Wt 250.4 lb

## 2019-05-29 DIAGNOSIS — R5383 Other fatigue: Secondary | ICD-10-CM | POA: Diagnosis not present

## 2019-05-29 DIAGNOSIS — G620 Drug-induced polyneuropathy: Secondary | ICD-10-CM | POA: Diagnosis not present

## 2019-05-29 DIAGNOSIS — C8108 Nodular lymphocyte predominant Hodgkin lymphoma, lymph nodes of multiple sites: Secondary | ICD-10-CM

## 2019-05-29 DIAGNOSIS — F419 Anxiety disorder, unspecified: Secondary | ICD-10-CM | POA: Diagnosis not present

## 2019-05-29 LAB — CBC WITH DIFFERENTIAL/PLATELET
Abs Immature Granulocytes: 0.01 10*3/uL (ref 0.00–0.07)
Basophils Absolute: 0 10*3/uL (ref 0.0–0.1)
Basophils Relative: 0 %
Eosinophils Absolute: 0.1 10*3/uL (ref 0.0–0.5)
Eosinophils Relative: 1 %
HCT: 45.5 % (ref 36.0–46.0)
Hemoglobin: 14.3 g/dL (ref 12.0–15.0)
Immature Granulocytes: 0 %
Lymphocytes Relative: 23 %
Lymphs Abs: 1 10*3/uL (ref 0.7–4.0)
MCH: 25.5 pg — ABNORMAL LOW (ref 26.0–34.0)
MCHC: 31.4 g/dL (ref 30.0–36.0)
MCV: 81.3 fL (ref 80.0–100.0)
Monocytes Absolute: 0.4 10*3/uL (ref 0.1–1.0)
Monocytes Relative: 9 %
Neutro Abs: 2.7 10*3/uL (ref 1.7–7.7)
Neutrophils Relative %: 67 %
Platelets: 214 10*3/uL (ref 150–400)
RBC: 5.6 MIL/uL — ABNORMAL HIGH (ref 3.87–5.11)
RDW: 14 % (ref 11.5–15.5)
WBC: 4.2 10*3/uL (ref 4.0–10.5)
nRBC: 0 % (ref 0.0–0.2)

## 2019-05-29 LAB — CMP (CANCER CENTER ONLY)
ALT: 17 U/L (ref 0–44)
AST: 18 U/L (ref 15–41)
Albumin: 3.9 g/dL (ref 3.5–5.0)
Alkaline Phosphatase: 60 U/L (ref 38–126)
Anion gap: 11 (ref 5–15)
BUN: 7 mg/dL (ref 6–20)
CO2: 26 mmol/L (ref 22–32)
Calcium: 9.4 mg/dL (ref 8.9–10.3)
Chloride: 105 mmol/L (ref 98–111)
Creatinine: 0.95 mg/dL (ref 0.44–1.00)
GFR, Est AFR Am: >60 mL/min (ref >60)
GFR, Estimated: >60 mL/min (ref >60)
Glucose, Bld: 96 mg/dL (ref 70–99)
Potassium: 4.2 mmol/L (ref 3.5–5.1)
Sodium: 142 mmol/L (ref 135–145)
Total Bilirubin: 0.4 mg/dL (ref 0.3–1.2)
Total Protein: 6.9 g/dL (ref 6.5–8.1)

## 2019-05-29 LAB — LACTATE DEHYDROGENASE: LDH: 151 U/L (ref 98–192)

## 2019-05-29 LAB — SEDIMENTATION RATE: Sed Rate: 5 mm/hr (ref 0–22)

## 2019-05-29 NOTE — Progress Notes (Signed)
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   HISTORY OF PRESENTING ILLNESS:  Bonnie Larsen is a wonderful 38 y.o. female who has been referred to [place] by Dr .Domingo Madeira, [facility] for evaluation and management of thoracic paraspinal masses and upper abdominal extensive lymphadenopathy.  Patient has a history of uterine fibroids, right breast benign lesion biopsied in 2016, anxiety/panic attacks, migraine headaches and chronic sinus issues who presented to the emergency room brought by EMS for chest pain and shortness of breath about 7-10 days ago. She was noted to be hypertensive with a blood pressure of 180/110 which improved with nitroglycerin. She was also having some back pain and felt like her chest pain was radiating to the back. D-dimer level was borderline elevated. She had a CTA of the chest on 01/06/2017 that showed no evidence of pulmonary embolism. She was however noted to have Multiple prevertebral paraspinal masses, differential diagnosis includes extramedullary hematopoiesis and metastasis/lymphadenopathy. Recommend correlation for laboratory analysis for anemia and lymphoproliferative disease.  Patient was subsequently seen by her primary care physician and had a CT of the abdomen and pelvis with contrast on 01/09/2017 which showed Extensive upper abdominal adenopathy favoring lymphoma/lymphoproliferative disease, tissue diagnosis recommended. No splenomegaly or focal splenic lesion.  She was referred to [place] for further evaluation and management.  She was here with her mother and husband.  She notes no abdominal, or chest trauma. No fevers chills night sweats or unexpected weight loss. Mild constipation but no other change in bowel habits. No GI  bleeding noted. No change in food intake or swallowing. No urinary discomfort or abnormalities. Currently having no chest pain or shortness of breath. Mild upper abdominal discomfort. No other obvious peripheral lymphadenopathy noted.   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 The pt reports that she is doing well overall.  The pt reports doing okay.  Pt has some shortness of breath if she is walking likely from deconditioning.  She is noticing some diarrhea when eating dairy products and juices high in citrus.  Lab results today (05/29/19) of CBC w/diff and CMP is as follows: all values are WNL except for RBC at 5.60, MCH at 25.5, nl CMP, SED RATE- WNL at 5, and LDH.-151  On review of systems, pt reports some neck and back pain and denies abdominal pain, leg swelling and any other symptoms.     MEDICAL HISTORY:  Past Medical History:  Diagnosis Date  . Anxiety   . Difficult intravenous access   . Family history of adverse reaction to anesthesia    pt's mother has hx. of post-op N/V and hard to wake up post-op  . History of panic attacks    02/08/17- has not had a panic attacks  . Lymphoma ([place]) 01/2017  . Mesenteric lymphadenopathy 01/2017  . Morbid obesity with BMI of 40.0-44.9, adult ([place])   . Runny nose 02/19/2017   clear drainage, per pt.  . Seasonal allergies     SURGICAL HISTORY: Past Surgical History:  Procedure Laterality Date  . LYMPH NODE BIOPSY N/A 02/11/2017  Procedure: HAND ASSISTED LAPAROSCOPIC LYMPH NODE BIOPSY;  Surgeon: Stark Klein, MD;  Location: [place];  Service: General;  Laterality: N/A;  . PORT-A-CATH REMOVAL N/A 09/04/2017   Procedure: REMOVAL PORT-A-CATH;  Surgeon: Stark Klein, MD;  Location: [place];  Service: General;  Laterality:  N/A;  . PORTACATH PLACEMENT N/A 02/20/2017   Procedure: INSERTION PORT-A-CATH;  Surgeon: Stark Klein, MD;  Location: [place];  Service: General;  Laterality: N/A;  . TUBAL LIGATION    . UMBILICAL HERNIA REPAIR  02/11/2017    SOCIAL HISTORY: Social History   Socioeconomic History  . Marital status: Married    Spouse name: Gwyndolyn Saxon  . Number of children: 2  . Years of education: Not on file  . Highest education level: Associate degree: occupational, Hotel manager, or vocational program  Occupational History  . Not on file  Social Needs  . Financial resource strain: Not hard at all  . Food insecurity    Worry: Never true    Inability: Never true  . Transportation needs    Medical: No    Non-medical: No  Tobacco Use  . Smoking status: Never Smoker  . Smokeless tobacco: Never Used  Substance and Sexual Activity  . Alcohol use: No    Alcohol/week: 0.0 standard drinks  . Drug use: No  . Sexual activity: Yes    Partners: Male    Birth control/protection: I.U.D.    Comment: tubal ligation  Lifestyle  . Physical activity    Days per week: 0 days    Minutes per session: 0 min  . Stress: Rather much  Relationships  . Social Herbalist on phone: More than three times a week    Gets together: Never    Attends religious service: Never    Active member of club or organization: No    Attends meetings of clubs or organizations: Never    Relationship status: Married  . Intimate partner violence    Fear of current or ex partner: No    Emotionally abused: No    Physically abused: No    Forced sexual activity: No  Other Topics Concern  . Not on file  Social History Narrative  . Not on file    FAMILY HISTORY: Family History  Problem Relation Age of Onset  . Breast cancer Mother 25  . Fibromyalgia Mother   . Rheum arthritis Mother   . Neuropathy Mother   . Anesthesia problems Mother        post-op N/V; hard to wake up post-op  . Congestive Heart  Failure Father        died at age 23  . Cirrhosis Father   . Heart disease Maternal Grandmother   . Diabetes Maternal Grandmother   . Other Brother        Benign spinal tumor  . Cancer Paternal Grandfather        Leukemia    ALLERGIES:  is allergic to clindamycin/lincomycin and cymbalta [duloxetine hcl].  MEDICATIONS:  Current Outpatient Medications  Medication Sig Dispense Refill  . amoxicillin-clavulanate (AUGMENTIN) 875-125 MG tablet Take 1 tablet by mouth 2 (two) times daily. 14 tablet 0  . Biotin 10 MG TABS Take by mouth.    . levonorgestrel (MIRENA) 20 MCG/24HR IUD 1 each by Intrauterine route once.    . Multiple Vitamin (MULTIVITAMIN) tablet Take 1 tablet by mouth daily.     No current facility-administered medications for this visit.    Facility-Administered Medications Ordered in Other  Visits  Medication Dose Route Frequency Provider Last Rate Last Dose  . sodium chloride flush (NS) 0.9 % injection 10 mL  10 mL Intracatheter PRN Brunetta Genera, MD   10 mL at 03/26/17 1724  . sodium chloride flush (NS) 0.9 % injection 10 mL  10 mL Intravenous PRN Brunetta Genera, MD   10 mL at 04/16/17 1938    REVIEW OF SYSTEMS:  A 10+ POINT REVIEW OF SYSTEMS WAS OBTAINED including neurology, dermatology, psychiatry, cardiac, respiratory, lymph, extremities, GI, GU, Musculoskeletal, constitutional, breasts, reproductive, HEENT.  All pertinent positives are noted in the HPI.  All others are negative.      PHYSICAL EXAMINATION:  ECOG FS:0 - Asymptomatic  Vitals:   05/29/19 0904  BP: 112/85  Pulse: 80  Resp: 16  Temp: 98 F (36.7 C)  SpO2: 100%   Wt Readings from Last 3 Encounters:  05/29/19 250 lb 6.4 oz (113.6 kg)  02/16/19 254 lb (115.2 kg)  11/27/18 247 lb 11.2 oz (112.4 kg)   Body mass index is 44.36 kg/m.    GENERAL:alert, in no acute distress and comfortable SKIN: no acute rashes, no significant lesions EYES: conjunctiva are pink and non-injected, sclera  anicteric OROPHARYNX: MMM, no exudates, no oropharyngeal erythema or ulceration NECK: supple, no JVD LYMPH:  no palpable lymphadenopathy in the cervical, axillary or inguinal regions LUNGS: clear to auscultation b/l with normal respiratory effort HEART: regular rate & rhythm ABDOMEN:  normoactive bowel sounds , non tender, not distended. Extremity: no pedal edema PSYCH: alert & oriented x 3 with fluent speech NEURO: no focal motor/sensory deficits   LABORATORY DATA:  I have reviewed the data as listed   . CBC Latest Ref Rng & Units 05/29/2019 02/16/2019 11/27/2018  WBC 4.0 - 10.5 K/uL 4.2 4.5 5.5  Hemoglobin 12.0 - 15.0 g/dL 14.3 14.9 14.0  Hematocrit 36.0 - 46.0 % 45.5 46.8(H) 44.4  Platelets 150 - 400 K/uL 214 239 217     . CMP Latest Ref Rng & Units 05/29/2019 02/16/2019 11/27/2018  Glucose 70 - 99 mg/dL 96 104(H) 91  BUN 6 - 20 mg/dL 7 13 8   Creatinine 0.44 - 1.00 mg/dL 0.95 1.01(H) 0.90  Sodium 135 - 145 mmol/L 142 142 140  Potassium 3.5 - 5.1 mmol/L 4.2 4.2 4.4  Chloride 98 - 111 mmol/L 105 105 105  CO2 22 - 32 mmol/L 26 26 28   Calcium 8.9 - 10.3 mg/dL 9.4 9.7 9.4  Total Protein 6.5 - 8.1 g/dL 6.9 7.3 6.8  Total Bilirubin 0.3 - 1.2 mg/dL 0.4 0.6 0.3  Alkaline Phos 38 - 126 U/L 60 60 50  AST 15 - 41 U/L 18 19 13(L)  ALT 0 - 44 U/L 17 16 8    . Lab Results  Component Value Date   LDH 151 05/29/2019    Lymphnode biopsy, 02/11/2017 Diagnosis Lymph node for lymphoma, Mesenteric Lymphadenopathy - HODGKIN LYMPHOMA.         PROCEDURES  ECHO 03/13/17 Study Conclusions - Left ventricle: The cavity size was normal. Systolic function was  normal. The estimated ejection fraction was in the range of 55%  to 60%. Wall motion was normal; there were no regional wall  motion abnormalities. Left ventricular diastolic function  parameters were normal. - Atrial septum: No defect or patent foramen ovale was identified. - Impressions: Normal GLS -20.2.   RADIOGRAPHIC STUDIES:  I have personally reviewed the radiological images as listed and agreed with the findings in the report. No results  found.    ASSESSMENT & PLAN:   38 y.o. 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 -PET/CT showed fairly active disease. Abdominal disease if progression occurs could potentially cause organ injury. -LDH level and sedimentation rate within normal limits . -HIV negative  -Hepatitis C negative.  -Baseline ECHO from 03/13/17 shows normal heart function, EF 55%-60%  Rpt PET/CT on 05/29/2017 - showed Significant reduction in size and metabolic activity of the formerly extensive adenopathy in the chest and abdomen, with primarily Deauville 3 activity today, and some Deauville 2 activity. Previously this was Deauville 5.  S/p R-CHOP x 6 cycles  PET/CT 08/06/2017: No evidence active lymphoma with minimal activity within the remaining periaortic and mesenteric lymph nodes - Deauville 2   3) Patient Active Problem List   Diagnosis Date Noted  . Migraine 09/03/2017  . Counseling regarding advanced care planning and goals of care 06/24/2017  . Nodular lymphocyte predominant Hodgkin lymphoma of lymph nodes of multiple regions ([place]) 03/19/2017  . Mesenteric lymphadenopathy 02/11/2017  . Adenopathy 01/10/2017  . 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 Abnormal chest CT 01/07/2017  . History of panic attacks   . Bacterial pharyngitis 07/30/2016  . 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 -continue taking Vitamin B complex with meals to  assist with this.     PLAN: -Discussed pt labwork today, 05/29/19;  all values are WNL except for RBC at 5.60, MCH at 25.5, normal CMP, SED RATE, and LDH. -Discussed adding a multivitamin  -Discussed Hemoglobin 05/29/19 14.3 -Discussed RBC 05/29/19 at 5.60 -Discussed Platelets 05/29/19 at 214 -Discussed staying active and maintaining healthy weight -patient has no clinical or lab evidence of lymphoma recurrence/progression at this time. -will continue active surveillance. -no residual toxicities from her chemotherapy.  FOLLOW UP: RTC with Dr [name] with labs in 3 months  The total time spent in the appt was 20 minutes and more than 50% was on counseling and direct patient cares.  All of the patient's questions were answered with apparent satisfaction. The patient knows to call the clinic with any problems, questions or concerns.    Sullivan Lone MD [place] AAHIVMS [facility] Hematology/Oncology Physician [facility]  (Office):       [phone] (Work cell):  [phone] (Fax):           [phone]  I, Scot Dock, am acting as a scribe for Dr. Sullivan Lone.   .I have reviewed the above documentation for accuracy and completeness, and I agree with the above. Brunetta Genera MD

## 2019-06-01 ENCOUNTER — Telehealth: Payer: Self-pay | Admitting: Hematology

## 2019-06-01 NOTE — Telephone Encounter (Signed)
Scheduled appt per 12/4 los.  Left a vm of the appt date and time.

## 2019-08-26 NOTE — Progress Notes (Signed)
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   HISTORY OF PRESENTING ILLNESS:   Bonnie Larsen is a wonderful 39 y.o. female who has been referred to [place] by Dr .Domingo Madeira, [facility] for evaluation and management of thoracic paraspinal masses and upper abdominal extensive lymphadenopathy.  Patient has a history of uterine fibroids, right breast benign lesion biopsied in 2016, anxiety/panic attacks, migraine headaches and chronic sinus issues who presented to the emergency room brought by EMS for chest pain and shortness of breath about 7-10 days ago. She was noted to be hypertensive with a blood pressure of 180/110 which improved with nitroglycerin. She was also having some back pain and felt like her chest pain was radiating to the back. D-dimer level was borderline elevated. She had a CTA of the chest on 01/06/2017 that showed no evidence of pulmonary embolism. She was however noted to have Multiple prevertebral paraspinal masses, differential diagnosis includes extramedullary hematopoiesis and metastasis/lymphadenopathy. Recommend correlation for laboratory analysis for anemia and lymphoproliferative disease.  Patient was subsequently seen by her primary care physician and had a CT of the abdomen and pelvis with contrast on 01/09/2017 which showed Extensive upper abdominal adenopathy favoring lymphoma/lymphoproliferative disease, tissue diagnosis recommended. No splenomegaly or focal splenic lesion.  She was referred to [place] for further evaluation and management.  She was here with her mother and husband.  She notes no abdominal, or chest trauma. No fevers chills night sweats or unexpected weight loss. Mild constipation but no other change in bowel habits. No GI  bleeding noted. No change in food intake or swallowing. No urinary discomfort or abnormalities. Currently having no chest pain or shortness of breath. Mild upper abdominal discomfort. No other obvious peripheral lymphadenopathy noted.   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 The patient's last visit with [place] was on 05/29/2019. The pt reports that she is doing well overall.  The pt reports she is doing well. She was having abdominal pain tightness and tenderness while standing. She has been walking recently. She still get SOB and tightness in between shoulder blades. She has been taking time for herself to be physically active she is taking about 5000-8000 steps daily. She has been trying to de-stress and while feeling overwhelmed she has walked away and decompressed.   Pt reports that skin is very dry after the Chemotherapy. She has had irregular bowl movements starting with them being normal, then will get constipated, then constantly using bathroom.   Lab results today (08/27/19) of CBC w/diff and CMP is as follows: all values are WNL except for RBC at 5.56, HCT at 46.1, MCH at 25.9. (08/27/2019) of CMP: all values are WNL (08/27/2019) of LDH at 141 and sed rate of 2  On review of systems, pt reports SOB, tightness between shoulder blades, abdominal pain, night sweats, tingling/numbness in feet and denies fever, chills, unexpected weight loss, rash, itching and any other symptoms.     MEDICAL HISTORY:  Past Medical History:  Diagnosis Date  . Anxiety   . Difficult intravenous access   . Family history of adverse reaction to anesthesia  pt's mother has hx. of post-op N/V and hard to wake up post-op  . History of panic attacks    02/08/17- has not had a panic attacks  . Lymphoma ([place]) 01/2017  . Mesenteric  lymphadenopathy 01/2017  . Morbid obesity with BMI of 40.0-44.9, adult ([place])   . Runny nose 02/19/2017   clear drainage, per pt.  . Seasonal allergies     SURGICAL HISTORY: Past Surgical History:  Procedure Laterality Date  . LYMPH NODE BIOPSY N/A 02/11/2017   Procedure: HAND ASSISTED LAPAROSCOPIC LYMPH NODE BIOPSY;  Surgeon: Stark Klein, MD;  Location: [place];  Service: General;  Laterality: N/A;  . PORT-A-CATH REMOVAL N/A 09/04/2017   Procedure: REMOVAL PORT-A-CATH;  Surgeon: Stark Klein, MD;  Location: [place];  Service: General;  Laterality: N/A;  . PORTACATH PLACEMENT N/A 02/20/2017   Procedure: INSERTION PORT-A-CATH;  Surgeon: Stark Klein, MD;  Location: [place];  Service: General;  Laterality: N/A;  . TUBAL LIGATION    . UMBILICAL HERNIA REPAIR  02/11/2017    SOCIAL HISTORY: Social History   Socioeconomic History  . Marital status: Married    Spouse name: Gwyndolyn Saxon  . Number of children: 2  . Years of education: Not on file  . Highest education level: Associate degree: occupational, Hotel manager, or vocational program  Occupational History  . Not on file  Tobacco Use  . Smoking status: Never Smoker  . Smokeless tobacco: Never Used  Substance and Sexual Activity  . Alcohol use: No    Alcohol/week: 0.0 standard drinks  . Drug use: No  . Sexual activity: Yes    Partners: Male    Birth control/protection: I.U.D.    Comment: tubal ligation  Other Topics Concern  . Not on file  Social History Narrative  . Not on file   Social Determinants of Health   Financial Resource Strain:   . Difficulty of Paying Living Expenses: Not on file  Food Insecurity:   . Worried About Charity fundraiser in the Last Year: Not on file  . Ran Out of Food in the Last Year: Not on file  Transportation Needs:   . Lack of Transportation (Medical): Not on file  . Lack of Transportation (Non-Medical): Not on file  Physical Activity:   . Days of Exercise per Week: Not on  file  . Minutes of Exercise per Session: Not on file  Stress:   . Feeling of Stress : Not on file  Social Connections:   . Frequency of Communication with Friends and Family: Not on file  . Frequency of Social Gatherings with Friends and Family: Not on file  . Attends Religious Services: Not on file  . Active Member of Clubs or Organizations: Not on file  . Attends Archivist Meetings: Not on file  . Marital Status: Not on file  Intimate Partner Violence:   . Fear of Current or Ex-Partner: Not on file  . Emotionally Abused: Not on file  . Physically Abused: Not on file  . Sexually Abused: Not on file    FAMILY HISTORY: Family History  Problem Relation Age of Onset  . Breast cancer Mother 61  . Fibromyalgia Mother   . Rheum arthritis Mother   . Neuropathy Mother   . Anesthesia problems Mother        post-op N/V; hard to wake up post-op  . Congestive Heart Failure Father        died at age 58  . Cirrhosis Father   .  Heart disease Maternal Grandmother   . Diabetes Maternal Grandmother   . Other Brother        Benign spinal tumor  . Cancer Paternal Grandfather        Leukemia    ALLERGIES:  is allergic to clindamycin/lincomycin and cymbalta [duloxetine hcl].  MEDICATIONS:  Current Outpatient Medications  Medication Sig Dispense Refill  . Biotin 10 MG TABS Take by mouth.    . levonorgestrel (MIRENA) 20 MCG/24HR IUD 1 each by Intrauterine route once.    . Multiple Vitamin (MULTIVITAMIN) tablet Take 1 tablet by mouth daily.     No current facility-administered medications for this visit.   Facility-Administered Medications Ordered in Other Visits  Medication Dose Route Frequency Provider Last Rate Last Admin  . sodium chloride flush (NS) 0.9 % injection 10 mL  10 mL Intracatheter PRN Brunetta Genera, MD   10 mL at 03/26/17 1724  . sodium chloride flush (NS) 0.9 % injection 10 mL  10 mL Intravenous PRN Brunetta Genera, MD   10 mL at 04/16/17 1938     REVIEW OF SYSTEMS:  A 10+ POINT REVIEW OF SYSTEMS WAS OBTAINED including neurology, dermatology, psychiatry, cardiac, respiratory, lymph, extremities, GI, GU, Musculoskeletal, constitutional, breasts, reproductive, HEENT.  All pertinent positives are noted in the HPI.  All others are negative.       PHYSICAL EXAMINATION:  ECOG FS:0 - Asymptomatic  There were no vitals filed for this visit. Wt Readings from Last 3 Encounters:  05/29/19 250 lb 6.4 oz (113.6 kg)  02/16/19 254 lb (115.2 kg)  11/27/18 247 lb 11.2 oz (112.4 kg)   There is no height or weight on file to calculate BMI.     ECOG FS:0 - Asymptomatic  There were no vitals filed for this visit. Wt Readings from Last 3 Encounters:  05/29/19 250 lb 6.4 oz (113.6 kg)  02/16/19 254 lb (115.2 kg)  11/27/18 247 lb 11.2 oz (112.4 kg)   There is no height or weight on file to calculate BMI.     GENERAL:alert, in no acute distress and comfortable SKIN: no acute rashes, no significant lesions EYES: conjunctiva are pink and non-injected, sclera anicteric OROPHARYNX: MMM, no exudates, no oropharyngeal erythema or ulceration NECK: supple, no JVD LYMPH:  no palpable lymphadenopathy in the cervical, axillary or inguinal regions LUNGS: clear to auscultation b/l with normal respiratory effort HEART: regular rate & rhythm ABDOMEN:  normoactive bowel sounds , non tender, not distended. Extremity: no pedal edema PSYCH: alert & oriented x 3 with fluent speech NEURO: no focal motor/sensory deficits   LABORATORY DATA:  I have reviewed the data as listed   . CBC Latest Ref Rng & Units 05/29/2019 02/16/2019 11/27/2018  WBC 4.0 - 10.5 K/uL 4.2 4.5 5.5  Hemoglobin 12.0 - 15.0 g/dL 14.3 14.9 14.0  Hematocrit 36.0 - 46.0 % 45.5 46.8(H) 44.4  Platelets 150 - 400 K/uL 214 239 217     . CMP Latest Ref Rng & Units 05/29/2019 02/16/2019 11/27/2018  Glucose 70 - 99 mg/dL 96 104(H) 91  BUN 6 - 20 mg/dL 7 13 8   Creatinine 0.44 - 1.00 mg/dL  0.95 1.01(H) 0.90  Sodium 135 - 145 mmol/L 142 142 140  Potassium 3.5 - 5.1 mmol/L 4.2 4.2 4.4  Chloride 98 - 111 mmol/L 105 105 105  CO2 22 - 32 mmol/L 26 26 28   Calcium 8.9 - 10.3 mg/dL 9.4 9.7 9.4  Total Protein 6.5 - 8.1 g/dL 6.9 7.3 6.8  Total Bilirubin 0.3 - 1.2 mg/dL 0.4 0.6 0.3  Alkaline Phos 38 - 126 U/L 60 60 50  AST 15 - 41 U/L 18 19 13(L)  ALT 0 - 44 U/L 17 16 8    . Lab Results  Component Value Date   LDH 151 05/29/2019    Lymphnode biopsy, 02/11/2017 Diagnosis Lymph node for lymphoma, Mesenteric Lymphadenopathy - HODGKIN LYMPHOMA.         PROCEDURES  ECHO 03/13/17 Study Conclusions - Left ventricle: The cavity size was normal. Systolic function was  normal. The estimated ejection fraction was in the range of 55%  to 60%. Wall motion was normal; there were no regional wall  motion abnormalities. Left ventricular diastolic function  parameters were normal. - Atrial septum: No defect or patent foramen ovale was identified. - Impressions: Normal GLS -20.2.   RADIOGRAPHIC STUDIES: I have personally reviewed the radiological images as listed and agreed with the findings in the report. No results found.    ASSESSMENT & PLAN:   39 y.o. 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 -PET/CT showed fairly active disease. Abdominal disease if progression occurs could potentially cause organ injury. -LDH level and sedimentation rate within normal limits . -HIV negative  -Hepatitis C negative.  -Baseline ECHO from 03/13/17 shows normal heart function, EF 55%-60%  Rpt PET/CT on 05/29/2017 - showed Significant reduction in size and metabolic activity of the formerly extensive adenopathy in the chest and  abdomen, with primarily Deauville 3 activity today, and some Deauville 2 activity. Previously this was Deauville 5.  S/p R-CHOP x 6 cycles  PET/CT 08/06/2017: No evidence active lymphoma with minimal activity within the remaining periaortic and mesenteric lymph nodes - Deauville 2   3) Patient Active Problem List   Diagnosis Date Noted  . Migraine 09/03/2017  . Counseling regarding advanced care planning and goals of care 06/24/2017  . Nodular lymphocyte predominant Hodgkin lymphoma of lymph nodes of multiple regions ([place]) 03/19/2017  . Mesenteric lymphadenopathy 02/11/2017  . Adenopathy 01/10/2017  . 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 Abnormal chest CT 01/07/2017  . History of panic attacks   . Bacterial pharyngitis 07/30/2016  . 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 -continue taking Vitamin B complex with meals to assist with this.     PLAN: -Discussed pt labwork today, 08/26/19; (08/27/19) of CBC w/diff and CMP is as follows: all values are WNL except for RBC at 5.56, HCT at 46.1, MCH at 25.9. -Discussed (08/27/2019) of CMP: all values are WNL -Discussed (08/27/2019) of LDH at 141, sed rate 2 -Advised on destressing for at least 10 minutes a day -Recommends staying physically active and walking  -Recommends taking COVID19 vaccine -Patient has no clinical or lab evidence of lymphoma recurrence/progression at this time. -Will continue active surveillance. -Will get repeat scans in 4 months   FOLLOW UP: Labs and CT chest/abd/pelvis in 17 weeks RTC with Dr [name] in 18 weeks   The total time spent in the appt was 15 minutes and more than 50% was on counseling and direct patient cares.  All of the patient's questions were answered with apparent  satisfaction. The patient knows to call  the clinic with any problems, questions or concerns.    Sullivan Lone MD [place] AAHIVMS [facility] Hematology/Oncology Physician [facility]  (Office):       [phone] (Work cell):  [phone] (Fax):           [phone]  I, Dawayne Cirri am acting as a scribe for Dr. Sullivan Lone.   .I have reviewed the above documentation for accuracy and completeness, and I agree with the above. Brunetta Genera MD

## 2019-08-27 ENCOUNTER — Inpatient Hospital Stay (HOSPITAL_BASED_OUTPATIENT_CLINIC_OR_DEPARTMENT_OTHER): Payer: 59 | Admitting: Hematology

## 2019-08-27 ENCOUNTER — Inpatient Hospital Stay: Payer: 59 | Attending: Hematology

## 2019-08-27 ENCOUNTER — Other Ambulatory Visit: Payer: Self-pay

## 2019-08-27 VITALS — BP 121/82 | HR 105 | Temp 98.2°F | Resp 18 | Ht 63.0 in | Wt 253.8 lb

## 2019-08-27 DIAGNOSIS — R109 Unspecified abdominal pain: Secondary | ICD-10-CM | POA: Diagnosis present

## 2019-08-27 DIAGNOSIS — C8108 Nodular lymphocyte predominant Hodgkin lymphoma, lymph nodes of multiple sites: Secondary | ICD-10-CM | POA: Diagnosis present

## 2019-08-27 DIAGNOSIS — Z808 Family history of malignant neoplasm of other organs or systems: Secondary | ICD-10-CM | POA: Insufficient documentation

## 2019-08-27 DIAGNOSIS — Z806 Family history of leukemia: Secondary | ICD-10-CM | POA: Diagnosis not present

## 2019-08-27 DIAGNOSIS — F419 Anxiety disorder, unspecified: Secondary | ICD-10-CM | POA: Diagnosis not present

## 2019-08-27 DIAGNOSIS — Z6841 Body Mass Index (BMI) 40.0 and over, adult: Secondary | ICD-10-CM | POA: Diagnosis not present

## 2019-08-27 DIAGNOSIS — G43909 Migraine, unspecified, not intractable, without status migrainosus: Secondary | ICD-10-CM | POA: Diagnosis present

## 2019-08-27 DIAGNOSIS — Z803 Family history of malignant neoplasm of breast: Secondary | ICD-10-CM | POA: Diagnosis not present

## 2019-08-27 DIAGNOSIS — Z9221 Personal history of antineoplastic chemotherapy: Secondary | ICD-10-CM | POA: Diagnosis not present

## 2019-08-27 LAB — CBC WITH DIFFERENTIAL/PLATELET
Abs Immature Granulocytes: 0.01 10*3/uL (ref 0.00–0.07)
Basophils Absolute: 0 10*3/uL (ref 0.0–0.1)
Basophils Relative: 0 %
Eosinophils Absolute: 0.1 10*3/uL (ref 0.0–0.5)
Eosinophils Relative: 1 %
HCT: 46.1 % — ABNORMAL HIGH (ref 36.0–46.0)
Hemoglobin: 14.4 g/dL (ref 12.0–15.0)
Immature Granulocytes: 0 %
Lymphocytes Relative: 21 %
Lymphs Abs: 1.2 10*3/uL (ref 0.7–4.0)
MCH: 25.9 pg — ABNORMAL LOW (ref 26.0–34.0)
MCHC: 31.2 g/dL (ref 30.0–36.0)
MCV: 82.9 fL (ref 80.0–100.0)
Monocytes Absolute: 0.6 10*3/uL (ref 0.1–1.0)
Monocytes Relative: 9 %
Neutro Abs: 4 10*3/uL (ref 1.7–7.7)
Neutrophils Relative %: 69 %
Platelets: 262 10*3/uL (ref 150–400)
RBC: 5.56 MIL/uL — ABNORMAL HIGH (ref 3.87–5.11)
RDW: 13.8 % (ref 11.5–15.5)
WBC: 5.9 10*3/uL (ref 4.0–10.5)
nRBC: 0 % (ref 0.0–0.2)

## 2019-08-27 LAB — CMP (CANCER CENTER ONLY)
ALT: 14 U/L (ref 0–44)
AST: 18 U/L (ref 15–41)
Albumin: 4.1 g/dL (ref 3.5–5.0)
Alkaline Phosphatase: 69 U/L (ref 38–126)
Anion gap: 8 (ref 5–15)
BUN: 9 mg/dL (ref 6–20)
CO2: 28 mmol/L (ref 22–32)
Calcium: 9.3 mg/dL (ref 8.9–10.3)
Chloride: 105 mmol/L (ref 98–111)
Creatinine: 0.93 mg/dL (ref 0.44–1.00)
GFR, Est AFR Am: >60 mL/min (ref >60)
GFR, Estimated: >60 mL/min (ref >60)
Glucose, Bld: 79 mg/dL (ref 70–99)
Potassium: 4.6 mmol/L (ref 3.5–5.1)
Sodium: 141 mmol/L (ref 135–145)
Total Bilirubin: 0.4 mg/dL (ref 0.3–1.2)
Total Protein: 7.2 g/dL (ref 6.5–8.1)

## 2019-08-27 LAB — SEDIMENTATION RATE: Sed Rate: 2 mm/hr (ref 0–22)

## 2019-08-27 LAB — LACTATE DEHYDROGENASE: LDH: 141 U/L (ref 98–192)

## 2019-08-31 ENCOUNTER — Telehealth: Payer: Self-pay | Admitting: Hematology

## 2019-08-31 NOTE — Telephone Encounter (Signed)
Scheduled per 03/04 los, patient has been called and notified. 

## 2019-12-24 ENCOUNTER — Other Ambulatory Visit: Payer: Self-pay

## 2019-12-24 ENCOUNTER — Ambulatory Visit (HOSPITAL_COMMUNITY)
Admission: RE | Admit: 2019-12-24 | Discharge: 2019-12-24 | Disposition: A | Discharge status: Home / Self Care | Payer: 59 | Source: Ambulatory Visit | Attending: Hematology | Admitting: Hematology

## 2019-12-24 ENCOUNTER — Inpatient Hospital Stay: Payer: 59 | Attending: Hematology

## 2019-12-24 DIAGNOSIS — Z79899 Other long term (current) drug therapy: Secondary | ICD-10-CM | POA: Insufficient documentation

## 2019-12-24 DIAGNOSIS — Z806 Family history of leukemia: Secondary | ICD-10-CM | POA: Diagnosis not present

## 2019-12-24 DIAGNOSIS — F419 Anxiety disorder, unspecified: Secondary | ICD-10-CM | POA: Diagnosis not present

## 2019-12-24 DIAGNOSIS — M549 Dorsalgia, unspecified: Secondary | ICD-10-CM | POA: Diagnosis not present

## 2019-12-24 DIAGNOSIS — Z8379 Family history of other diseases of the digestive system: Secondary | ICD-10-CM | POA: Diagnosis not present

## 2019-12-24 DIAGNOSIS — Z820 Family history of epilepsy and other diseases of the nervous system: Secondary | ICD-10-CM | POA: Insufficient documentation

## 2019-12-24 DIAGNOSIS — Z8249 Family history of ischemic heart disease and other diseases of the circulatory system: Secondary | ICD-10-CM | POA: Diagnosis not present

## 2019-12-24 DIAGNOSIS — Z803 Family history of malignant neoplasm of breast: Secondary | ICD-10-CM | POA: Insufficient documentation

## 2019-12-24 DIAGNOSIS — R10819 Abdominal tenderness, unspecified site: Secondary | ICD-10-CM | POA: Diagnosis not present

## 2019-12-24 DIAGNOSIS — G629 Polyneuropathy, unspecified: Secondary | ICD-10-CM | POA: Insufficient documentation

## 2019-12-24 DIAGNOSIS — R61 Generalized hyperhidrosis: Secondary | ICD-10-CM | POA: Diagnosis not present

## 2019-12-24 DIAGNOSIS — K219 Gastro-esophageal reflux disease without esophagitis: Secondary | ICD-10-CM | POA: Diagnosis not present

## 2019-12-24 DIAGNOSIS — Z833 Family history of diabetes mellitus: Secondary | ICD-10-CM | POA: Insufficient documentation

## 2019-12-24 DIAGNOSIS — C8108 Nodular lymphocyte predominant Hodgkin lymphoma, lymph nodes of multiple sites: Secondary | ICD-10-CM | POA: Insufficient documentation

## 2019-12-24 DIAGNOSIS — Z8261 Family history of arthritis: Secondary | ICD-10-CM | POA: Diagnosis not present

## 2019-12-24 DIAGNOSIS — G43909 Migraine, unspecified, not intractable, without status migrainosus: Secondary | ICD-10-CM | POA: Diagnosis not present

## 2019-12-24 DIAGNOSIS — R079 Chest pain, unspecified: Secondary | ICD-10-CM | POA: Insufficient documentation

## 2019-12-24 DIAGNOSIS — R109 Unspecified abdominal pain: Secondary | ICD-10-CM | POA: Diagnosis not present

## 2019-12-24 DIAGNOSIS — Z8269 Family history of other diseases of the musculoskeletal system and connective tissue: Secondary | ICD-10-CM | POA: Diagnosis not present

## 2019-12-24 DIAGNOSIS — I10 Essential (primary) hypertension: Secondary | ICD-10-CM | POA: Diagnosis not present

## 2019-12-24 DIAGNOSIS — R0602 Shortness of breath: Secondary | ICD-10-CM | POA: Insufficient documentation

## 2019-12-24 LAB — CMP (CANCER CENTER ONLY)
ALT: 14 U/L (ref 0–44)
AST: 17 U/L (ref 15–41)
Albumin: 3.9 g/dL (ref 3.5–5.0)
Alkaline Phosphatase: 61 U/L (ref 38–126)
Anion gap: 11 (ref 5–15)
BUN: 8 mg/dL (ref 6–20)
CO2: 25 mmol/L (ref 22–32)
Calcium: 9.4 mg/dL (ref 8.9–10.3)
Chloride: 103 mmol/L (ref 98–111)
Creatinine: 0.9 mg/dL (ref 0.44–1.00)
GFR, Est AFR Am: >60 mL/min (ref >60)
GFR, Estimated: >60 mL/min (ref >60)
Glucose, Bld: 87 mg/dL (ref 70–99)
Potassium: 4.1 mmol/L (ref 3.5–5.1)
Sodium: 139 mmol/L (ref 135–145)
Total Bilirubin: 0.4 mg/dL (ref 0.3–1.2)
Total Protein: 7.1 g/dL (ref 6.5–8.1)

## 2019-12-24 LAB — CBC WITH DIFFERENTIAL/PLATELET
Abs Immature Granulocytes: 0.01 10*3/uL (ref 0.00–0.07)
Basophils Absolute: 0 10*3/uL (ref 0.0–0.1)
Basophils Relative: 0 %
Eosinophils Absolute: 0.1 10*3/uL (ref 0.0–0.5)
Eosinophils Relative: 1 %
HCT: 43.8 % (ref 36.0–46.0)
Hemoglobin: 14 g/dL (ref 12.0–15.0)
Immature Granulocytes: 0 %
Lymphocytes Relative: 20 %
Lymphs Abs: 1 10*3/uL (ref 0.7–4.0)
MCH: 26.2 pg (ref 26.0–34.0)
MCHC: 32 g/dL (ref 30.0–36.0)
MCV: 81.9 fL (ref 80.0–100.0)
Monocytes Absolute: 0.5 10*3/uL (ref 0.1–1.0)
Monocytes Relative: 10 %
Neutro Abs: 3.4 10*3/uL (ref 1.7–7.7)
Neutrophils Relative %: 69 %
Platelets: 234 10*3/uL (ref 150–400)
RBC: 5.35 MIL/uL — ABNORMAL HIGH (ref 3.87–5.11)
RDW: 13.6 % (ref 11.5–15.5)
WBC: 5 10*3/uL (ref 4.0–10.5)
nRBC: 0 % (ref 0.0–0.2)

## 2019-12-24 LAB — SEDIMENTATION RATE: Sed Rate: 6 mm/hr (ref 0–22)

## 2019-12-24 LAB — LACTATE DEHYDROGENASE: LDH: 122 U/L (ref 98–192)

## 2019-12-24 MED ORDER — SODIUM CHLORIDE (PF) 0.9 % IJ SOLN
INTRAMUSCULAR | Status: AC
  Filled 2019-12-24: qty 50

## 2019-12-24 MED ORDER — IOHEXOL 300 MG/ML  SOLN
100.0000 mL | Freq: Once | INTRAMUSCULAR | Status: AC | PRN
  Administered 2019-12-24: 100 mL via INTRAVENOUS

## 2019-12-30 NOTE — Progress Notes (Signed)
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   HISTORY OF PRESENTING ILLNESS:   Bonnie Larsen is a wonderful 39 y.o. female who has been referred to [place] by Dr .Patient, No Pcp Per for evaluation and management of thoracic paraspinal masses and upper abdominal extensive lymphadenopathy.  Patient has a history of uterine fibroids, right breast benign lesion biopsied in 2016, anxiety/panic attacks, migraine headaches and chronic sinus issues who presented to the emergency room brought by EMS for chest pain and shortness of breath about 7-10 days ago. She was noted to be hypertensive with a blood pressure of 180/110 which improved with nitroglycerin. She was also having some back pain and felt like her chest pain was radiating to the back. D-dimer level was borderline elevated. She had a CTA of the chest on 01/06/2017 that showed no evidence of pulmonary embolism. She was however noted to have Multiple prevertebral paraspinal masses, differential diagnosis includes extramedullary hematopoiesis and metastasis/lymphadenopathy. Recommend correlation for laboratory analysis for anemia and lymphoproliferative disease.  Patient was subsequently seen by her primary care physician and had a CT of the abdomen and pelvis with contrast on 01/09/2017 which showed Extensive upper abdominal adenopathy favoring lymphoma/lymphoproliferative disease, tissue diagnosis recommended. No splenomegaly or focal splenic lesion.  She was referred to [place] for further evaluation and management.  She was here with her mother and husband.  She notes no abdominal, or chest trauma. No fevers chills night sweats or unexpected weight loss. Mild constipation but no other change in bowel  habits. No GI bleeding noted. No change in food intake or swallowing. No urinary discomfort or abnormalities. Currently having no chest pain or shortness of breath. Mild upper abdominal discomfort. No other obvious peripheral lymphadenopathy noted.   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 The patient's last visit with [place] was on 08/27/19. The pt reports that she is doing well overall.  The pt reports she is good. Pt has had no major changes and her mammogram was still stable. Her energy levels have varied but she has been working more. Pt has been having night sweats sometimes, lower abdominal tenderness and SOB sometimes. She has not been as active but her work schedule is slowing down so she will be able to be more active.   Of note since the patient's last visit, pt has had CT Abdomen Pelvis w Contrast ([id number]) completed on 12/25/19 with results revealing "1. Mild interval progression of mediastinal lymph nodes, now measuring upper normal to borderline enlarged. Central mesenteric and retroperitoneal lymph nodes have progressed more substantially and some of these lymph nodes now meet CT criteria for pathologic enlargement. Disease progression now a concern. 2. Otherwise no acute findings." Pt had CT Chest w Contrast ([id number]) completed on 12/24/19 with results revealing "1. Mild interval progression of mediastinal lymph nodes, now measuring upper normal to borderline enlarged. Central mesenteric and retroperitoneal lymph nodes have progressed more substantially and some of these lymph nodes now meet CT criteria for pathologic enlargement. Disease progression now a concern. 2. Otherwise no acute findings."  Lab results today (12/24/19) of CBC w/diff  and CMP is as follows: all values are WNL except for RBC at 5.35 12/24/19 of  Sedimentation Rate at 6: WNL 12/24/19 of LDH at 122: WNL  On review of systems, pt reports lower abdomen tenderness, night sweats, SOB, acid reflux and denies fevers, chills, change in breathing, irregular bowl habits, trouble urinating and any other symptoms.   MEDICAL HISTORY:  Past Medical History:  Diagnosis Date  . Anxiety   . Difficult intravenous access   . Family history of adverse reaction to anesthesia    pt's mother has hx. of post-op N/V and hard to wake up post-op  . History of panic attacks    02/08/17- has not had a panic attacks  . Lymphoma ([place]) 01/2017  . Mesenteric lymphadenopathy 01/2017  . Morbid obesity with BMI of 40.0-44.9, adult ([place])   . Runny nose 02/19/2017   clear drainage, per pt.  . Seasonal allergies     SURGICAL HISTORY: Past Surgical History:  Procedure Laterality Date  . LYMPH NODE BIOPSY N/A 02/11/2017   Procedure: HAND ASSISTED LAPAROSCOPIC LYMPH NODE BIOPSY;  Surgeon: Stark Klein, MD;  Location: [facility];  Service: General;  Laterality: N/A;  . PORT-A-CATH REMOVAL N/A 09/04/2017   Procedure: REMOVAL PORT-A-CATH;  Surgeon: Stark Klein, MD;  Location: [facility];  Service: General;  Laterality: N/A;  . PORTACATH PLACEMENT N/A 02/20/2017   Procedure: INSERTION PORT-A-CATH;  Surgeon: Stark Klein, MD;  Location: [facility];  Service: General;  Laterality: N/A;  . TUBAL LIGATION    . UMBILICAL HERNIA REPAIR  02/11/2017    SOCIAL HISTORY: Social History   Socioeconomic History  . Marital status: Married    Spouse name: Gwyndolyn Saxon  . Number of children: 2  . Years of education: Not on file  . Highest education level: Associate degree: occupational, Hotel manager, or vocational program  Occupational History  . Not on file  Tobacco Use  . Smoking status: Never Smoker  . Smokeless tobacco: Never Used  Vaping Use  . Vaping Use: Never used  Substance and Sexual Activity  . Alcohol use: No    Alcohol/week: 0.0 standard drinks  . Drug  use: No  . Sexual activity: Yes    Partners: Male    Birth control/protection: I.U.D.    Comment: tubal ligation  Other Topics Concern  . Not on file  Social History Narrative  . Not on file   Social Determinants of Health   Financial Resource Strain:   . Difficulty of Paying Living Expenses:   Food Insecurity:   . Worried About Charity fundraiser in the Last Year:   . Arboriculturist in the Last Year:   Transportation Needs:   . Film/video editor (Medical):   Marland Kitchen Lack of Transportation (Non-Medical):   Physical Activity:   . Days of Exercise per Week:   . Minutes of Exercise per Session:   Stress:   . Feeling of Stress :   Social Connections:   . Frequency of Communication with Friends and Family:   . Frequency of Social Gatherings with Friends and Family:   . Attends Religious Services:   . Active Member of Clubs or Organizations:   . Attends Archivist Meetings:   Marland Kitchen Marital Status:   Intimate Partner Violence:   . Fear of Current or Ex-Partner:   . Emotionally Abused:   Marland Kitchen Physically Abused:   . Sexually Abused:     FAMILY HISTORY: Family History  Problem Relation Age of  Onset  . Breast cancer Mother 44  . Fibromyalgia Mother   . Rheum arthritis Mother   . Neuropathy Mother   . Anesthesia problems Mother        post-op N/V; hard to wake up post-op  . Congestive Heart Failure Father        died at age 15  . Cirrhosis Father   . Heart disease Maternal Grandmother   . Diabetes Maternal Grandmother   . Other Brother        Benign spinal tumor  . Cancer Paternal Grandfather        Leukemia    ALLERGIES:  is allergic to clindamycin/lincomycin and cymbalta [duloxetine hcl].  MEDICATIONS:  Current Outpatient Medications  Medication Sig Dispense Refill  . Biotin 10 MG TABS Take by mouth.    . levonorgestrel (MIRENA) 20 MCG/24HR IUD 1 each by Intrauterine route once.    . Multiple Vitamin (MULTIVITAMIN) tablet Take 1 tablet by mouth daily.      No current facility-administered medications for this visit.   Facility-Administered Medications Ordered in Other Visits  Medication Dose Route Frequency Provider Last Rate Last Admin  . sodium chloride flush (NS) 0.9 % injection 10 mL  10 mL Intravenous PRN Brunetta Genera, MD   10 mL at 04/16/17 1938    REVIEW OF SYSTEMS:  A 10+ POINT REVIEW OF SYSTEMS WAS OBTAINED including neurology, dermatology, psychiatry, cardiac, respiratory, lymph, extremities, GI, GU, Musculoskeletal, constitutional, breasts, reproductive, HEENT.  All pertinent positives are noted in the HPI.  All others are negative.   PHYSICAL EXAMINATION:  ECOG FS:0 - Asymptomatic  Vitals:   12/31/19 1030  BP: 119/87  Pulse: 93  Resp: 18  Temp: 97.7 F (36.5 C)  SpO2: 100%   Wt Readings from Last 3 Encounters:  12/31/19 256 lb 8 oz (116.3 kg)  08/27/19 253 lb 12.8 oz (115.1 kg)  05/29/19 250 lb 6.4 oz (113.6 kg)   Body mass index is 45.44 kg/m.     ECOG FS:0 - Asymptomatic  Vitals:   12/31/19 1030  BP: 119/87  Pulse: 93  Resp: 18  Temp: 97.7 F (36.5 C)  SpO2: 100%   Wt Readings from Last 3 Encounters:  12/31/19 256 lb 8 oz (116.3 kg)  08/27/19 253 lb 12.8 oz (115.1 kg)  05/29/19 250 lb 6.4 oz (113.6 kg)   Body mass index is 45.44 kg/m.    GENERAL:alert, in no acute distress and comfortable SKIN: no acute rashes, no significant lesions EYES: conjunctiva are pink and non-injected, sclera anicteric OROPHARYNX: MMM, no exudates, no oropharyngeal erythema or ulceration NECK: supple, no JVD LYMPH:  no palpable lymphadenopathy in the cervical, axillary or inguinal regions LUNGS: clear to auscultation b/l with normal respiratory effort HEART: regular rate & rhythm ABDOMEN:  normoactive bowel sounds , non tender, not distended. Extremity: no pedal edema PSYCH: alert & oriented x 3 with fluent speech NEURO: no focal motor/sensory deficits  LABORATORY DATA:  I have reviewed the data as  listed   . CBC Latest Ref Rng & Units 12/24/2019 08/27/2019 05/29/2019  WBC 4.0 - 10.5 K/uL 5.0 5.9 4.2  Hemoglobin 12.0 - 15.0 g/dL 14.0 14.4 14.3  Hematocrit 36 - 46 % 43.8 46.1(H) 45.5  Platelets 150 - 400 K/uL 234 262 214     . CMP Latest Ref Rng & Units 12/24/2019 08/27/2019 05/29/2019  Glucose 70 - 99 mg/dL 87 79 96  BUN 6 - 20 mg/dL 8 9 7   Creatinine 0.44 -  1.00 mg/dL 0.90 0.93 0.95  Sodium 135 - 145 mmol/L 139 141 142  Potassium 3.5 - 5.1 mmol/L 4.1 4.6 4.2  Chloride 98 - 111 mmol/L 103 105 105  CO2 22 - 32 mmol/L 25 28 26   Calcium 8.9 - 10.3 mg/dL 9.4 9.3 9.4  Total Protein 6.5 - 8.1 g/dL 7.1 7.2 6.9  Total Bilirubin 0.3 - 1.2 mg/dL 0.4 0.4 0.4  Alkaline Phos 38 - 126 U/L 61 69 60  AST 15 - 41 U/L 17 18 18   ALT 0 - 44 U/L 14 14 17    . Lab Results  Component Value Date   LDH 122 12/24/2019    Lymphnode biopsy, 02/11/2017 Diagnosis Lymph node for lymphoma, Mesenteric Lymphadenopathy - HODGKIN LYMPHOMA.         PROCEDURES  ECHO 03/13/17 Study Conclusions - Left ventricle: The cavity size was normal. Systolic function was  normal. The estimated ejection fraction was in the range of 55%  to 60%. Wall motion was normal; there were no regional wall  motion abnormalities. Left ventricular diastolic function  parameters were normal. - Atrial septum: No defect or patent foramen ovale was identified. - Impressions: Normal GLS -20.2.   RADIOGRAPHIC STUDIES: I have personally reviewed the radiological images as listed and agreed with the findings in the report. CT Chest W Contrast  Result Date: 12/25/2019 CLINICAL DATA:  Hodgkin's lymphoma.  Restaging. EXAM: CT CHEST, ABDOMEN, AND PELVIS WITH CONTRAST TECHNIQUE: Multidetector CT imaging of the chest, abdomen and pelvis was performed following the standard protocol during bolus administration of intravenous contrast. CONTRAST:  167mL OMNIPAQUE IOHEXOL 300 MG/ML  SOLN COMPARISON:  02/23/2019 FINDINGS: CT CHEST FINDINGS  Cardiovascular: The heart size is normal. No substantial pericardial effusion. No thoracic aortic aneurysm. Mediastinum/Nodes: No mediastinal lymphadenopathy. Low para-aortic lymph node measures upper normal but increased at 10 mm today (44/2) compared 8 mm previously. 8 mm low paraesophageal node on 43/2 today has increased from 6 mm when I remeasure in a similar fashion on the prior study. There is no hilar lymphadenopathy. The esophagus has normal imaging features. There is no axillary lymphadenopathy. Lungs/Pleura: No suspicious pulmonary nodule or mass. No focal airspace consolidation. No pleural effusion. Musculoskeletal: No worrisome lytic or sclerotic osseous abnormality. Stable soft tissue density inferior right breast measuring 3.7 x 1.4 cm today. CT ABDOMEN PELVIS FINDINGS Hepatobiliary: No suspicious focal abnormality within the liver parenchyma. There is no evidence for gallstones, gallbladder wall thickening, or pericholecystic fluid. No intrahepatic or extrahepatic biliary dilation. Pancreas: No focal mass lesion. No dilatation of the main duct. No intraparenchymal cyst. No peripancreatic edema. Spleen: No splenomegaly. No focal mass lesion. Adrenals/Urinary Tract: No adrenal nodule or mass. Duplicated intrarenal collecting systems bilaterally. No evidence for hydroureter. The urinary bladder appears normal for the degree of distention. Stomach/Bowel: Stomach is unremarkable. No gastric wall thickening. No evidence of outlet obstruction. Duodenum is normally positioned as is the ligament of Treitz. No small bowel wall thickening. No small bowel dilatation. The terminal ileum is normal. The appendix is normal. No gross colonic mass. No colonic wall thickening. Vascular/Lymphatic: No abdominal aortic aneurysm. Clustered lymph nodes in the central small bowel mesentery are similar to prior. Due to mobility of the small bowel mesentery, reproducible measurement is difficult, but these lymph nodes appear  slightly progressed in the interval. Index 12 mm short axis node measured previously probably corresponds to the 19 mm short axis node visible on image 66/2 today. 15 mm short axis lymph node adjacent to the  SMV on 73/2 was 13 mm short axis previously. The new 12 mm aortocaval node identified on the prior study has progressed and measures 12 mm short axis on 78/2 today. 11 mm short axis left para-aortic node on 69/2 has increased from 7 mm when I remeasure in a similar fashion on the prior study. No pelvic sidewall lymphadenopathy. Reproductive: IUD visualized in the uterus. There is no adnexal mass. Other: No intraperitoneal free fluid. Musculoskeletal: No worrisome lytic or sclerotic osseous abnormality. IMPRESSION: 1. Mild interval progression of mediastinal lymph nodes, now measuring upper normal to borderline enlarged. Central mesenteric and retroperitoneal lymph nodes have progressed more substantially and some of these lymph nodes now meet CT criteria for pathologic enlargement. Disease progression now a concern. 2. Otherwise no acute findings. Electronically Signed   By: Misty Stanley M.D.   On: 12/25/2019 11:06   CT Abdomen Pelvis W Contrast  Result Date: 12/25/2019 CLINICAL DATA:  Hodgkin's lymphoma.  Restaging. EXAM: CT CHEST, ABDOMEN, AND PELVIS WITH CONTRAST TECHNIQUE: Multidetector CT imaging of the chest, abdomen and pelvis was performed following the standard protocol during bolus administration of intravenous contrast. CONTRAST:  125mL OMNIPAQUE IOHEXOL 300 MG/ML  SOLN COMPARISON:  02/23/2019 FINDINGS: CT CHEST FINDINGS Cardiovascular: The heart size is normal. No substantial pericardial effusion. No thoracic aortic aneurysm. Mediastinum/Nodes: No mediastinal lymphadenopathy. Low para-aortic lymph node measures upper normal but increased at 10 mm today (44/2) compared 8 mm previously. 8 mm low paraesophageal node on 43/2 today has increased from 6 mm when I remeasure in a similar fashion on the  prior study. There is no hilar lymphadenopathy. The esophagus has normal imaging features. There is no axillary lymphadenopathy. Lungs/Pleura: No suspicious pulmonary nodule or mass. No focal airspace consolidation. No pleural effusion. Musculoskeletal: No worrisome lytic or sclerotic osseous abnormality. Stable soft tissue density inferior right breast measuring 3.7 x 1.4 cm today. CT ABDOMEN PELVIS FINDINGS Hepatobiliary: No suspicious focal abnormality within the liver parenchyma. There is no evidence for gallstones, gallbladder wall thickening, or pericholecystic fluid. No intrahepatic or extrahepatic biliary dilation. Pancreas: No focal mass lesion. No dilatation of the main duct. No intraparenchymal cyst. No peripancreatic edema. Spleen: No splenomegaly. No focal mass lesion. Adrenals/Urinary Tract: No adrenal nodule or mass. Duplicated intrarenal collecting systems bilaterally. No evidence for hydroureter. The urinary bladder appears normal for the degree of distention. Stomach/Bowel: Stomach is unremarkable. No gastric wall thickening. No evidence of outlet obstruction. Duodenum is normally positioned as is the ligament of Treitz. No small bowel wall thickening. No small bowel dilatation. The terminal ileum is normal. The appendix is normal. No gross colonic mass. No colonic wall thickening. Vascular/Lymphatic: No abdominal aortic aneurysm. Clustered lymph nodes in the central small bowel mesentery are similar to prior. Due to mobility of the small bowel mesentery, reproducible measurement is difficult, but these lymph nodes appear slightly progressed in the interval. Index 12 mm short axis node measured previously probably corresponds to the 19 mm short axis node visible on image 66/2 today. 15 mm short axis lymph node adjacent to the SMV on 73/2 was 13 mm short axis previously. The new 12 mm aortocaval node identified on the prior study has progressed and measures 12 mm short axis on 78/2 today. 11 mm  short axis left para-aortic node on 69/2 has increased from 7 mm when I remeasure in a similar fashion on the prior study. No pelvic sidewall lymphadenopathy. Reproductive: IUD visualized in the uterus. There is no adnexal mass. Other: No intraperitoneal free  fluid. Musculoskeletal: No worrisome lytic or sclerotic osseous abnormality. IMPRESSION: 1. Mild interval progression of mediastinal lymph nodes, now measuring upper normal to borderline enlarged. Central mesenteric and retroperitoneal lymph nodes have progressed more substantially and some of these lymph nodes now meet CT criteria for pathologic enlargement. Disease progression now a concern. 2. Otherwise no acute findings. Electronically Signed   By: Misty Stanley M.D.   On: 12/25/2019 11:06      ASSESSMENT & PLAN:   39 y.o. 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 -PET/CT showed fairly active disease. Abdominal disease if progression occurs could potentially cause organ injury. -LDH level and sedimentation rate within normal limits . -HIV negative  -Hepatitis C negative.  -Baseline ECHO from 03/13/17 shows normal heart function, EF 55%-60%  Rpt PET/CT on 05/29/2017 - showed Significant reduction in size and metabolic activity of the formerly extensive adenopathy in the chest and abdomen, with primarily Deauville 3 activity today, and some Deauville 2 activity. Previously this was Deauville 5.  S/p R-CHOP x 6 cycles  PET/CT 08/06/2017: No evidence active lymphoma with minimal activity within the remaining periaortic and mesenteric lymph nodes - Deauville 2   3) Patient Active Problem List   Diagnosis Date Noted  . Migraine 09/03/2017  . Counseling regarding advanced care  planning and goals of care 06/24/2017  . Nodular lymphocyte predominant Hodgkin lymphoma of lymph nodes of multiple regions ([place]) 03/19/2017  . Mesenteric lymphadenopathy 02/11/2017  . Adenopathy 01/10/2017  . 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 Abnormal chest CT 01/07/2017  . History of panic attacks   . Bacterial pharyngitis 07/30/2016  . 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 -continue taking Vitamin B complex with meals to assist with this.     PLAN: -Discussed pt labwork today, 12/24/19; of CBC w/diff and CMP is as follows: all values are WNL except for RBC at 5.35 -Discussed 12/24/19 of Sedimentation Rate at 6: WNL -Discussed 12/24/19 of LDH at 122: WNL -Discussed 12/24/19 of CT Chest w Contrast ([id number]) -Discussed 12/25/19 of CT Abdomen Pelvis w Contrast ([id number])  -Patient has minimal radiographic evidence of lymphoma recurrence/progression at this time. -Advised on indolen tnature of lymphoma . Patient currently asymptomatic. -Advised on NCCN Guidelines  -Advised on destressing for at least 10 minutes a day -Advised bulky disease, symptomatic disease, threatening organs, abnormal blood counts would be future indications needing further treatment -Advised on possibility of re-treatment with Rituxan eventually  -Recommended that the pt continue to eat well, drink at least 48-64 oz of water each day, and walk 20-30 minutes each day.  -Recommends staying physically active and walking  -Recommends taking 2000 units daily of Vitamin D -Recommend OTC Prilosec prior to breakfast   -Will continue active surveillance -Will see back in 4 months with labs   FOLLOW UP: RTC with Dr [name] with labs in 4 months  The total time spent in the appt was 30 minutes and more than 50% was on  counseling and direct patient cares.  All of the patient's questions were answered with apparent satisfaction. The patient knows to call the clinic with any problems, questions or  concerns.  Sullivan Lone MD [place] AAHIVMS [facility] Hematology/Oncology Physician [facility]  (Office):       [phone] (Work cell):  [phone] (Fax):           [phone]  I, Dawayne Cirri am acting as a scribe for Dr. Sullivan Lone.   .I have reviewed the above documentation for accuracy and completeness, and I agree with the above. Brunetta Genera MD

## 2019-12-31 ENCOUNTER — Inpatient Hospital Stay (HOSPITAL_BASED_OUTPATIENT_CLINIC_OR_DEPARTMENT_OTHER): Payer: 59 | Admitting: Hematology

## 2019-12-31 ENCOUNTER — Other Ambulatory Visit: Payer: Self-pay

## 2019-12-31 VITALS — BP 119/87 | HR 93 | Temp 97.7°F | Resp 18 | Ht 63.0 in | Wt 256.5 lb

## 2019-12-31 DIAGNOSIS — C8108 Nodular lymphocyte predominant Hodgkin lymphoma, lymph nodes of multiple sites: Secondary | ICD-10-CM | POA: Diagnosis not present

## 2020-01-06 ENCOUNTER — Telehealth: Payer: Self-pay | Admitting: Hematology

## 2020-01-06 NOTE — Telephone Encounter (Signed)
Scheduled per 07/08 los, patient has been called and notified.

## 2020-05-04 ENCOUNTER — Inpatient Hospital Stay (HOSPITAL_BASED_OUTPATIENT_CLINIC_OR_DEPARTMENT_OTHER): Payer: 59 | Admitting: Hematology

## 2020-05-04 ENCOUNTER — Other Ambulatory Visit: Payer: Self-pay

## 2020-05-04 ENCOUNTER — Inpatient Hospital Stay: Payer: 59 | Attending: Hematology

## 2020-05-04 VITALS — BP 114/70 | HR 102 | Temp 97.8°F | Resp 18 | Ht 63.0 in | Wt 256.2 lb

## 2020-05-04 DIAGNOSIS — Z79899 Other long term (current) drug therapy: Secondary | ICD-10-CM | POA: Diagnosis not present

## 2020-05-04 DIAGNOSIS — Z833 Family history of diabetes mellitus: Secondary | ICD-10-CM | POA: Diagnosis not present

## 2020-05-04 DIAGNOSIS — R5383 Other fatigue: Secondary | ICD-10-CM | POA: Insufficient documentation

## 2020-05-04 DIAGNOSIS — Z820 Family history of epilepsy and other diseases of the nervous system: Secondary | ICD-10-CM | POA: Diagnosis not present

## 2020-05-04 DIAGNOSIS — F419 Anxiety disorder, unspecified: Secondary | ICD-10-CM | POA: Diagnosis not present

## 2020-05-04 DIAGNOSIS — M549 Dorsalgia, unspecified: Secondary | ICD-10-CM | POA: Insufficient documentation

## 2020-05-04 DIAGNOSIS — Z8249 Family history of ischemic heart disease and other diseases of the circulatory system: Secondary | ICD-10-CM | POA: Insufficient documentation

## 2020-05-04 DIAGNOSIS — R61 Generalized hyperhidrosis: Secondary | ICD-10-CM | POA: Diagnosis not present

## 2020-05-04 DIAGNOSIS — Z803 Family history of malignant neoplasm of breast: Secondary | ICD-10-CM | POA: Diagnosis not present

## 2020-05-04 DIAGNOSIS — Z806 Family history of leukemia: Secondary | ICD-10-CM | POA: Insufficient documentation

## 2020-05-04 DIAGNOSIS — Z8379 Family history of other diseases of the digestive system: Secondary | ICD-10-CM | POA: Diagnosis not present

## 2020-05-04 DIAGNOSIS — R109 Unspecified abdominal pain: Secondary | ICD-10-CM

## 2020-05-04 DIAGNOSIS — R232 Flushing: Secondary | ICD-10-CM | POA: Diagnosis not present

## 2020-05-04 DIAGNOSIS — R197 Diarrhea, unspecified: Secondary | ICD-10-CM | POA: Insufficient documentation

## 2020-05-04 DIAGNOSIS — Z8269 Family history of other diseases of the musculoskeletal system and connective tissue: Secondary | ICD-10-CM | POA: Insufficient documentation

## 2020-05-04 DIAGNOSIS — G43909 Migraine, unspecified, not intractable, without status migrainosus: Secondary | ICD-10-CM | POA: Diagnosis not present

## 2020-05-04 DIAGNOSIS — C8108 Nodular lymphocyte predominant Hodgkin lymphoma, lymph nodes of multiple sites: Secondary | ICD-10-CM

## 2020-05-04 DIAGNOSIS — Z8261 Family history of arthritis: Secondary | ICD-10-CM | POA: Diagnosis not present

## 2020-05-04 LAB — CMP (CANCER CENTER ONLY)
ALT: 11 U/L (ref 0–44)
AST: 17 U/L (ref 15–41)
Albumin: 3.8 g/dL (ref 3.5–5.0)
Alkaline Phosphatase: 59 U/L (ref 38–126)
Anion gap: 9 (ref 5–15)
BUN: 10 mg/dL (ref 6–20)
CO2: 29 mmol/L (ref 22–32)
Calcium: 9.7 mg/dL (ref 8.9–10.3)
Chloride: 104 mmol/L (ref 98–111)
Creatinine: 1.12 mg/dL — ABNORMAL HIGH (ref 0.44–1.00)
GFR, Estimated: >60 mL/min (ref >60)
Glucose, Bld: 98 mg/dL (ref 70–99)
Potassium: 4.2 mmol/L (ref 3.5–5.1)
Sodium: 142 mmol/L (ref 135–145)
Total Bilirubin: 0.4 mg/dL (ref 0.3–1.2)
Total Protein: 7.1 g/dL (ref 6.5–8.1)

## 2020-05-04 LAB — CBC WITH DIFFERENTIAL/PLATELET
Abs Immature Granulocytes: 0.01 10*3/uL (ref 0.00–0.07)
Basophils Absolute: 0 10*3/uL (ref 0.0–0.1)
Basophils Relative: 0 %
Eosinophils Absolute: 0.1 10*3/uL (ref 0.0–0.5)
Eosinophils Relative: 1 %
HCT: 42.2 % (ref 36.0–46.0)
Hemoglobin: 13.2 g/dL (ref 12.0–15.0)
Immature Granulocytes: 0 %
Lymphocytes Relative: 21 %
Lymphs Abs: 1.1 10*3/uL (ref 0.7–4.0)
MCH: 25.6 pg — ABNORMAL LOW (ref 26.0–34.0)
MCHC: 31.3 g/dL (ref 30.0–36.0)
MCV: 81.9 fL (ref 80.0–100.0)
Monocytes Absolute: 0.5 10*3/uL (ref 0.1–1.0)
Monocytes Relative: 9 %
Neutro Abs: 3.7 10*3/uL (ref 1.7–7.7)
Neutrophils Relative %: 69 %
Platelets: 231 10*3/uL (ref 150–400)
RBC: 5.15 MIL/uL — ABNORMAL HIGH (ref 3.87–5.11)
RDW: 13.8 % (ref 11.5–15.5)
WBC: 5.4 10*3/uL (ref 4.0–10.5)
nRBC: 0 % (ref 0.0–0.2)

## 2020-05-04 LAB — SEDIMENTATION RATE: Sed Rate: 6 mm/hr (ref 0–22)

## 2020-05-04 LAB — LACTATE DEHYDROGENASE: LDH: 130 U/L (ref 98–192)

## 2020-05-04 NOTE — Progress Notes (Signed)
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   HISTORY OF PRESENTING ILLNESS:   Bonnie Larsen is a wonderful 39 y.o. female who has been referred to [place] by Dr .Patient, No Pcp Per for evaluation and management of thoracic paraspinal masses and upper abdominal extensive lymphadenopathy.  Patient has a history of uterine fibroids, right breast benign lesion biopsied in 2016, anxiety/panic attacks, migraine headaches and chronic sinus issues who presented to the emergency room brought by EMS for chest pain and shortness of breath about 7-10 days ago. She was noted to be hypertensive with a blood pressure of 180/110 which improved with nitroglycerin. She was also having some back pain and felt like her chest pain was radiating to the back. D-dimer level was borderline elevated. She had a CTA of the chest on 01/06/2017 that showed no evidence of pulmonary embolism. She was however noted to have Multiple prevertebral paraspinal masses, differential diagnosis includes extramedullary hematopoiesis and metastasis/lymphadenopathy. Recommend correlation for laboratory analysis for anemia and lymphoproliferative disease.  Patient was subsequently seen by her primary care physician and had a CT of the abdomen and pelvis with contrast on 01/09/2017 which showed Extensive upper abdominal adenopathy favoring lymphoma/lymphoproliferative disease, tissue diagnosis recommended. No splenomegaly or focal splenic lesion.  She was referred to [place] for further evaluation and management.  She was here with her mother and husband.  She notes no abdominal, or chest trauma. No fevers chills night sweats or unexpected weight loss. Mild constipation but no other change in bowel  habits. No GI bleeding noted. No change in food intake or swallowing. No urinary discomfort or abnormalities. Currently having no chest pain or shortness of breath. Mild upper abdominal discomfort. No other obvious peripheral lymphadenopathy noted.   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 The patient's last visit with [place] was on 12/31/2019. The pt reports that she is doing well overall.  The pt reports that some days she feels well, but other days she does not. On the days that she does not feel great she experiences fatigue, abdominal pain, and/or upper back pain. Her back pain is not persistent or progressive and can last from a few minutes to most of the day. She denies any back injuries or an overly sedentary lifestyle. Pt denies diarrhea, but has had looser stool recently. Pt was experiencing hot flashes, but these episodes started lasting longer and longer, and pt is currently hot most of the time. She often wakes up drenched in sweat. She is experiencing shortness of breath that is not associated with any activity. Pt began feeling palpitations in the last few weeks, but has not been able to observe any abnormal HR readings on her Fitbit.  Lab results today (05/04/20) of CBC w/diff and CMP is as follows: all values are WNL except for RBC at 5.15, MCH at 25.6, Creatinine at 1.12. 05/04/2020 LDH at 130 05/04/2020 Sed rate at 6  On review of systems, pt reports abdominal pain, back pain, fatigue, inconsistent appetite, hot flashes, night sweats, anxiety, loose stools and denies fevers, unexpected weight loss, new lumps/bumps and any other symptoms.  MEDICAL HISTORY:  Past Medical History:  Diagnosis Date  . Anxiety   . Difficult intravenous access   . Family history of adverse reaction to anesthesia    pt's mother has hx. of  post-op N/V and hard to wake up post-op  . History of panic attacks    02/08/17- has not had a panic attacks  . Lymphoma ([place]) 01/2017  . Mesenteric lymphadenopathy 01/2017  . Morbid obesity with BMI of 40.0-44.9, adult ([place])   . Runny nose 02/19/2017   clear drainage, per pt.  . Seasonal allergies     SURGICAL HISTORY: Past Surgical History:  Procedure Laterality Date  . LYMPH NODE BIOPSY N/A 02/11/2017   Procedure: HAND ASSISTED LAPAROSCOPIC LYMPH NODE BIOPSY;  Surgeon: Stark Klein, MD;  Location: [place];  Service: General;  Laterality: N/A;  . PORT-A-CATH REMOVAL N/A 09/04/2017   Procedure: REMOVAL PORT-A-CATH;  Surgeon: Stark Klein, MD;  Location: [place];  Service: General;  Laterality: N/A;  . PORTACATH PLACEMENT N/A 02/20/2017   Procedure: INSERTION PORT-A-CATH;  Surgeon: Stark Klein, MD;  Location: [place];  Service: General;  Laterality: N/A;  . TUBAL LIGATION    . UMBILICAL HERNIA REPAIR  02/11/2017    SOCIAL HISTORY: Social History   Socioeconomic History  . Marital status: Married    Spouse name: Gwyndolyn Saxon  . Number of children: 2  . Years of education: Not on file  . Highest education level: Associate degree: occupational, Hotel manager, or vocational program  Occupational History  . Not on file  Tobacco Use  . Smoking status: Never Smoker  . Smokeless tobacco: Never Used  Vaping Use  . Vaping Use: Never used  Substance and Sexual Activity  . Alcohol use: No    Alcohol/week: 0.0 standard drinks  . Drug use: No  . Sexual activity: Yes    Partners: Male    Birth control/protection: I.U.D.    Comment: tubal ligation  Other Topics Concern  . Not on file  Social History Narrative  . Not on file   Social Determinants of Health   Financial Resource Strain:   . Difficulty of Paying Living Expenses: Not on file  Food Insecurity:   . Worried About Charity fundraiser in the Last Year: Not on file  . Ran Out of Food in the Last Year: Not on file   Transportation Needs:   . Lack of Transportation (Medical): Not on file  . Lack of Transportation (Non-Medical): Not on file  Physical Activity:   . Days of Exercise per Week: Not on file  . Minutes of Exercise per Session: Not on file  Stress:   . Feeling of Stress : Not on file  Social Connections:   . Frequency of Communication with Friends and Family: Not on file  . Frequency of Social Gatherings with Friends and Family: Not on file  . Attends Religious Services: Not on file  . Active Member of Clubs or Organizations: Not on file  . Attends Archivist Meetings: Not on file  . Marital Status: Not on file  Intimate Partner Violence:   . Fear of Current or Ex-Partner: Not on file  . Emotionally Abused: Not on file  . Physically Abused: Not on file  . Sexually Abused: Not on file    FAMILY HISTORY: Family History  Problem Relation Age of Onset  . Breast cancer Mother 45  . Fibromyalgia Mother   . Rheum arthritis Mother   . Neuropathy Mother   .  Anesthesia problems Mother        post-op N/V; hard to wake up post-op  . Congestive Heart Failure Father        died at age 69  . Cirrhosis Father   . Heart disease Maternal Grandmother   . Diabetes Maternal Grandmother   . Other Brother        Benign spinal tumor  . Cancer Paternal Grandfather        Leukemia    ALLERGIES:  is allergic to clindamycin/lincomycin and cymbalta [duloxetine hcl].  MEDICATIONS:  Current Outpatient Medications  Medication Sig Dispense Refill  . Biotin 10 MG TABS Take by mouth.    . levonorgestrel (MIRENA) 20 MCG/24HR IUD 1 each by Intrauterine route once.    . Multiple Vitamin (MULTIVITAMIN) tablet Take 1 tablet by mouth daily.     No current facility-administered medications for this visit.   Facility-Administered Medications Ordered in Other Visits  Medication Dose Route Frequency Provider Last Rate Last Admin  . sodium chloride flush (NS) 0.9 % injection 10 mL  10 mL Intravenous  PRN Brunetta Genera, MD   10 mL at 04/16/17 1938    REVIEW OF SYSTEMS:  A 10+ POINT REVIEW OF SYSTEMS WAS OBTAINED including neurology, dermatology, psychiatry, cardiac, respiratory, lymph, extremities, GI, GU, Musculoskeletal, constitutional, breasts, reproductive, HEENT.  All pertinent positives are noted in the HPI.  All others are negative.   PHYSICAL EXAMINATION:  ECOG FS:0 - Asymptomatic  Vitals:   05/04/20 1416  BP: 114/70  Pulse: (!) 102  Resp: 18  Temp: 97.8 F (36.6 C)  SpO2: 100%   Wt Readings from Last 3 Encounters:  05/04/20 256 lb 3.2 oz (116.2 kg)  12/31/19 256 lb 8 oz (116.3 kg)  08/27/19 253 lb 12.8 oz (115.1 kg)   Body mass index is 45.38 kg/m.     ECOG FS:0 - Asymptomatic  Vitals:   05/04/20 1416  BP: 114/70  Pulse: (!) 102  Resp: 18  Temp: 97.8 F (36.6 C)  SpO2: 100%   Wt Readings from Last 3 Encounters:  05/04/20 256 lb 3.2 oz (116.2 kg)  12/31/19 256 lb 8 oz (116.3 kg)  08/27/19 253 lb 12.8 oz (115.1 kg)   Body mass index is 45.38 kg/m.    GENERAL:alert, in no acute distress and comfortable SKIN: no acute rashes, no significant lesions EYES: conjunctiva are pink and non-injected, sclera anicteric OROPHARYNX: MMM, no exudates, no oropharyngeal erythema or ulceration NECK: supple, no JVD LYMPH:  no palpable lymphadenopathy in the cervical, axillary or inguinal regions LUNGS: clear to auscultation b/l with normal respiratory effort HEART: regular rate & rhythm ABDOMEN:  normoactive bowel sounds , non tender, not distended. No palpable hepatosplenomegaly.  Extremity: no pedal edema PSYCH: alert & oriented x 3 with fluent speech NEURO: no focal motor/sensory deficits  LABORATORY DATA:  I have reviewed the data as listed   . CBC Latest Ref Rng & Units 05/04/2020 12/24/2019 08/27/2019  WBC 4.0 - 10.5 K/uL 5.4 5.0 5.9  Hemoglobin 12.0 - 15.0 g/dL 13.2 14.0 14.4  Hematocrit 36 - 46 % 42.2 43.8 46.1(H)  Platelets 150 - 400 K/uL 231 234  262     . CMP Latest Ref Rng & Units 05/04/2020 12/24/2019 08/27/2019  Glucose 70 - 99 mg/dL 98 87 79  BUN 6 - 20 mg/dL 10 8 9   Creatinine 0.44 - 1.00 mg/dL 1.12(H) 0.90 0.93  Sodium 135 - 145 mmol/L 142 139 141  Potassium 3.5 - 5.1  mmol/L 4.2 4.1 4.6  Chloride 98 - 111 mmol/L 104 103 105  CO2 22 - 32 mmol/L 29 25 28   Calcium 8.9 - 10.3 mg/dL 9.7 9.4 9.3  Total Protein 6.5 - 8.1 g/dL 7.1 7.1 7.2  Total Bilirubin 0.3 - 1.2 mg/dL 0.4 0.4 0.4  Alkaline Phos 38 - 126 U/L 59 61 69  AST 15 - 41 U/L 17 17 18   ALT 0 - 44 U/L 11 14 14    . Lab Results  Component Value Date   LDH 130 05/04/2020    Lymphnode biopsy, 02/11/2017 Diagnosis Lymph node for lymphoma, Mesenteric Lymphadenopathy - HODGKIN LYMPHOMA.         PROCEDURES  ECHO 03/13/17 Study Conclusions - Left ventricle: The cavity size was normal. Systolic function was  normal. The estimated ejection fraction was in the range of 55%  to 60%. Wall motion was normal; there were no regional wall  motion abnormalities. Left ventricular diastolic function  parameters were normal. - Atrial septum: No defect or patent foramen ovale was identified. - Impressions: Normal GLS -20.2.   RADIOGRAPHIC STUDIES: I have personally reviewed the radiological images as listed and agreed with the findings in the report. No results found.    ASSESSMENT & PLAN:   39 y.o. 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 -PET/CT showed fairly active disease. Abdominal disease if progression occurs could potentially cause organ injury. -LDH level and sedimentation rate within normal limits . -HIV negative  -Hepatitis C negative.  -Baseline ECHO from 03/13/17 shows normal heart function, EF  55%-60%  Rpt PET/CT on 05/29/2017 - showed Significant reduction in size and metabolic activity of the formerly extensive adenopathy in the chest and abdomen, with primarily Deauville 3 activity today, and some Deauville 2 activity. Previously this was Deauville 5.  S/p R-CHOP x 6 cycles  PET/CT 08/06/2017: No evidence active lymphoma with minimal activity within the remaining periaortic and mesenteric lymph nodes - Deauville 2   3) Patient Active Problem List   Diagnosis Date Noted  . Migraine 09/03/2017  . Counseling regarding advanced care planning and goals of care 06/24/2017  . Nodular lymphocyte predominant Hodgkin lymphoma of lymph nodes of multiple regions ([place]) 03/19/2017  . Mesenteric lymphadenopathy 02/11/2017  . Adenopathy 01/10/2017  . 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 Abnormal chest CT 01/07/2017  . History of panic attacks   . Bacterial pharyngitis 07/30/2016  . 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 -continue taking Vitamin B complex with meals to assist with this.     PLAN: -Discussed pt labwork today, 05/04/20; blood counts look good, blood chemistries suggest dehydration, LDH & Sed Rate are WNL. -Concern for symptomatic progression of pt's Hodgkins lymphoma at this time. No laboratory evidence of recurrence.  -Discussed CDC guidelines regarding the COVID19 booster. Recommend pt wait two months after initial vaccination to receive booster.  -Recommended that the pt continue to eat well, cut down on caffeine, drink at least 48-64 oz of water each day, and stay physically active. -Will get repeat CT C/A/P in 3 weeks  -Will see back in 4 weeks via phone  FOLLOW UP: CT chest/abd/pelvis in 3 weeks Phone visit with Dr [name] in 4 weeks   The total time spent in the appt was  20 minutes and more than 50% was on counseling and direct patient cares.  All of the patient's questions were answered with apparent satisfaction. The patient knows to call the clinic with any problems, questions or concerns.   Sullivan Lone MD [place] AAHIVMS [facility] Hematology/Oncology Physician [facility], [facility], [facility]  (Office):       [phone] (Work cell):  [phone] (Fax):           [phone]  I, Yevette Edwards, am acting as a scribe for Dr. Sullivan Lone.   .I have reviewed the above documentation for accuracy and completeness, and I agree with the above. Brunetta Genera MD

## 2020-05-24 ENCOUNTER — Ambulatory Visit (HOSPITAL_COMMUNITY)
Admission: RE | Admit: 2020-05-24 | Discharge: 2020-05-24 | Disposition: A | Discharge status: Home / Self Care | Payer: 59 | Source: Ambulatory Visit | Attending: Hematology | Admitting: Hematology

## 2020-05-24 ENCOUNTER — Encounter (HOSPITAL_COMMUNITY): Payer: Self-pay

## 2020-05-24 ENCOUNTER — Other Ambulatory Visit: Payer: Self-pay

## 2020-05-24 DIAGNOSIS — R61 Generalized hyperhidrosis: Secondary | ICD-10-CM | POA: Diagnosis present

## 2020-05-24 DIAGNOSIS — R109 Unspecified abdominal pain: Secondary | ICD-10-CM | POA: Diagnosis present

## 2020-05-24 DIAGNOSIS — C8108 Nodular lymphocyte predominant Hodgkin lymphoma, lymph nodes of multiple sites: Secondary | ICD-10-CM | POA: Diagnosis present

## 2020-05-24 MED ORDER — IOHEXOL 300 MG/ML  SOLN
100.0000 mL | Freq: Once | INTRAMUSCULAR | Status: AC | PRN
  Administered 2020-05-24: 100 mL via INTRAVENOUS

## 2020-05-30 ENCOUNTER — Telehealth: Payer: Self-pay | Admitting: Hematology

## 2020-05-30 NOTE — Telephone Encounter (Signed)
Contacted patient to verify phone visit for pre reg °

## 2020-05-31 ENCOUNTER — Inpatient Hospital Stay: Payer: 59 | Attending: Hematology | Admitting: Hematology

## 2020-05-31 DIAGNOSIS — Z806 Family history of leukemia: Secondary | ICD-10-CM | POA: Diagnosis not present

## 2020-05-31 DIAGNOSIS — Z833 Family history of diabetes mellitus: Secondary | ICD-10-CM | POA: Insufficient documentation

## 2020-05-31 DIAGNOSIS — Z8379 Family history of other diseases of the digestive system: Secondary | ICD-10-CM | POA: Insufficient documentation

## 2020-05-31 DIAGNOSIS — C8108 Nodular lymphocyte predominant Hodgkin lymphoma, lymph nodes of multiple sites: Secondary | ICD-10-CM | POA: Diagnosis not present

## 2020-05-31 DIAGNOSIS — Z803 Family history of malignant neoplasm of breast: Secondary | ICD-10-CM | POA: Insufficient documentation

## 2020-05-31 DIAGNOSIS — Z8261 Family history of arthritis: Secondary | ICD-10-CM | POA: Insufficient documentation

## 2020-05-31 DIAGNOSIS — Z79899 Other long term (current) drug therapy: Secondary | ICD-10-CM | POA: Insufficient documentation

## 2020-05-31 DIAGNOSIS — Z820 Family history of epilepsy and other diseases of the nervous system: Secondary | ICD-10-CM | POA: Diagnosis not present

## 2020-05-31 DIAGNOSIS — Z8249 Family history of ischemic heart disease and other diseases of the circulatory system: Secondary | ICD-10-CM | POA: Diagnosis not present

## 2020-05-31 DIAGNOSIS — F419 Anxiety disorder, unspecified: Secondary | ICD-10-CM | POA: Diagnosis not present

## 2020-05-31 DIAGNOSIS — Z8269 Family history of other diseases of the musculoskeletal system and connective tissue: Secondary | ICD-10-CM | POA: Diagnosis not present

## 2020-05-31 NOTE — Progress Notes (Signed)
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   HISTORY OF PRESENTING ILLNESS:   Bonnie Larsen is a wonderful 39 y.o. female who has been referred to [place] by Dr .Patient, No Pcp Per for evaluation and management of thoracic paraspinal masses and upper abdominal extensive lymphadenopathy.  Patient has a history of uterine fibroids, right breast benign lesion biopsied in 2016, anxiety/panic attacks, migraine headaches and chronic sinus issues who presented to the emergency room brought by EMS for chest pain and shortness of breath about 7-10 days ago. She was noted to be hypertensive with a blood pressure of 180/110 which improved with nitroglycerin. She was also having some back pain and felt like her chest pain was radiating to the back. D-dimer level was borderline elevated. She had a CTA of the chest on 01/06/2017 that showed no evidence of pulmonary embolism. She was however noted to have Multiple prevertebral paraspinal masses, differential diagnosis includes extramedullary hematopoiesis and metastasis/lymphadenopathy. Recommend correlation for laboratory analysis for anemia and lymphoproliferative disease.  Patient was subsequently seen by her primary care physician and had a CT of the abdomen and pelvis with contrast on 01/09/2017 which showed Extensive upper abdominal adenopathy favoring lymphoma/lymphoproliferative disease, tissue diagnosis recommended. No splenomegaly or focal splenic lesion.  She was referred to [place] for further evaluation and management.  She was here with her mother and husband.  She notes no abdominal, or chest trauma. No fevers chills night sweats or unexpected weight loss. Mild constipation but no other change in bowel  habits. No GI bleeding noted. No change in food intake or swallowing. No urinary discomfort or abnormalities. Currently having no chest pain or shortness of breath. Mild upper abdominal discomfort. No other obvious peripheral lymphadenopathy noted.   PREVIOUS THERAPY:    R-CHOP x 6 cycles (Fanale et al 2010)   INTERVAL HISTORY: I connected with  [name] on 05/31/20 by telephone and verified that I am speaking with the correct person using two identifiers.   I discussed the limitations of evaluation and management by telemedicine. The patient expressed understanding and agreed to proceed.  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 The patient's last visit with [place] was on 05/04/2020. The pt reports that she is doing well overall.  The pt reports intermittent abdominal burning. She notes that the burning sensation in her back has improved. Pt currently has an IUD and cannot assess if she is in menopause.  Pt has noticed a new lump the size of a grape along her chin. She denies any pain in the area. This lump is new in the last two weeks and has been stable during that time. Pt does not believe that she has any current dental issues and has not received any recent vaccinations. Pt has been experiencing a cough and sore throat for the last week.   Of note since the patient's last visit, pt has had CT C/A/P ([id number]) completed on 05/24/2020 with results revealing "1. Stable adenopathy in the low chest and  abdomen. No evidence of disease progression. Spleen is normal in size. 2. Mild hepatomegaly. 3. Right breast soft tissue mass, stable. Correlate with physical exam, mammogram and ultrasound, as clinically indicated."  On  review of systems, pt reports cough, sore throat, abdominal discomfort, hot flashes and denies dental pain, gum bleed/swelling and any other symptoms.   MEDICAL HISTORY:  Past Medical History:  Diagnosis Date  . Anxiety   . Difficult intravenous access   . Family history of adverse reaction to anesthesia    pt's mother has hx. of post-op N/V and hard to wake up post-op  . History of panic attacks    02/08/17- has not had a panic attacks  . Lymphoma ([place]) 01/2017  . Mesenteric lymphadenopathy 01/2017  . Morbid obesity with BMI of 40.0-44.9, adult ([place])   . Runny nose 02/19/2017   clear drainage, per pt.  . Seasonal allergies     SURGICAL HISTORY: Past Surgical History:  Procedure Laterality Date  . LYMPH NODE BIOPSY N/A 02/11/2017   Procedure: HAND ASSISTED LAPAROSCOPIC LYMPH NODE BIOPSY;  Surgeon: Stark Klein, MD;  Location: [facility];  Service: General;  Laterality: N/A;  . PORT-A-CATH REMOVAL N/A 09/04/2017   Procedure: REMOVAL PORT-A-CATH;  Surgeon: Stark Klein, MD;  Location: [facility];  Service: General;  Laterality: N/A;  . PORTACATH PLACEMENT N/A 02/20/2017   Procedure: INSERTION PORT-A-CATH;  Surgeon: Stark Klein, MD;  Location: [facility];  Service: General;  Laterality: N/A;  . TUBAL LIGATION    . UMBILICAL HERNIA REPAIR  02/11/2017    SOCIAL HISTORY: Social History   Socioeconomic History  . Marital status: Married    Spouse name: Gwyndolyn Saxon  . Number of children: 2  . Years of education: Not on file  . Highest education level: Associate degree: occupational, Hotel manager, or vocational program  Occupational History  . Not on file  Tobacco Use  . Smoking status: Never Smoker  . Smokeless tobacco: Never Used  Vaping Use  . Vaping Use: Never used  Substance and Sexual Activity  . Alcohol use: No    Alcohol/week: 0.0 standard drinks  . Drug use: No  . Sexual activity: Yes    Partners: Male    Birth control/protection: I.U.D.    Comment: tubal ligation   Other Topics Concern  . Not on file  Social History Narrative  . Not on file   Social Determinants of Health   Financial Resource Strain:   . Difficulty of Paying Living Expenses: Not on file  Food Insecurity:   . Worried About Charity fundraiser in the Last Year: Not on file  . Ran Out of Food in the Last Year: Not on file  Transportation Needs:   . Lack of Transportation (Medical): Not on file  . Lack of Transportation (Non-Medical): Not on file  Physical Activity:   . Days of Exercise per Week: Not on file  . Minutes of Exercise per Session: Not on file  Stress:   . Feeling of Stress : Not on file  Social Connections:   . Frequency of Communication with Friends and Family: Not on file  . Frequency of Social Gatherings with Friends and Family: Not on file  . Attends Religious Services: Not on file  . Active Member of Clubs or Organizations: Not on file  . Attends Archivist Meetings: Not on file  . Marital Status: Not on file  Intimate Partner Violence:   . Fear of Current or Ex-Partner: Not on file  .  Emotionally Abused: Not on file  . Physically Abused: Not on file  . Sexually Abused: Not on file    FAMILY HISTORY: Family History  Problem Relation Age of Onset  . Breast cancer Mother 83  . Fibromyalgia Mother   . Rheum arthritis Mother   . Neuropathy Mother   . Anesthesia problems Mother        post-op N/V; hard to wake up post-op  . Congestive Heart Failure Father        died at age 61  . Cirrhosis Father   . Heart disease Maternal Grandmother   . Diabetes Maternal Grandmother   . Other Brother        Benign spinal tumor  . Cancer Paternal Grandfather        Leukemia    ALLERGIES:  is allergic to clindamycin/lincomycin and cymbalta [duloxetine hcl].  MEDICATIONS:  Current Outpatient Medications  Medication Sig Dispense Refill  . Biotin 10 MG TABS Take by mouth.    . levonorgestrel (MIRENA) 20 MCG/24HR IUD 1 each by Intrauterine route once.     . Multiple Vitamin (MULTIVITAMIN) tablet Take 1 tablet by mouth daily.     No current facility-administered medications for this visit.   Facility-Administered Medications Ordered in Other Visits  Medication Dose Route Frequency Provider Last Rate Last Admin  . sodium chloride flush (NS) 0.9 % injection 10 mL  10 mL Intravenous PRN Brunetta Genera, MD   10 mL at 04/16/17 1938    REVIEW OF SYSTEMS:  A 10+ POINT REVIEW OF SYSTEMS WAS OBTAINED including neurology, dermatology, psychiatry, cardiac, respiratory, lymph, extremities, GI, GU, Musculoskeletal, constitutional, breasts, reproductive, HEENT.  All pertinent positives are noted in the HPI.  All others are negative.   PHYSICAL EXAMINATION:  ECOG FS:0 - Asymptomatic  There were no vitals filed for this visit. Wt Readings from Last 3 Encounters:  05/04/20 256 lb 3.2 oz (116.2 kg)  12/31/19 256 lb 8 oz (116.3 kg)  08/27/19 253 lb 12.8 oz (115.1 kg)   There is no height or weight on file to calculate BMI.     ECOG FS:0 - Asymptomatic  There were no vitals filed for this visit. Wt Readings from Last 3 Encounters:  05/04/20 256 lb 3.2 oz (116.2 kg)  12/31/19 256 lb 8 oz (116.3 kg)  08/27/19 253 lb 12.8 oz (115.1 kg)   There is no height or weight on file to calculate BMI.    Telehealth visit 05/31/2020  LABORATORY DATA:  I have reviewed the data as listed   . CBC Latest Ref Rng & Units 05/04/2020 12/24/2019 08/27/2019  WBC 4.0 - 10.5 K/uL 5.4 5.0 5.9  Hemoglobin 12.0 - 15.0 g/dL 13.2 14.0 14.4  Hematocrit 36 - 46 % 42.2 43.8 46.1(H)  Platelets 150 - 400 K/uL 231 234 262     . CMP Latest Ref Rng & Units 05/04/2020 12/24/2019 08/27/2019  Glucose 70 - 99 mg/dL 98 87 79  BUN 6 - 20 mg/dL 10 8 9   Creatinine 0.44 - 1.00 mg/dL 1.12(H) 0.90 0.93  Sodium 135 - 145 mmol/L 142 139 141  Potassium 3.5 - 5.1 mmol/L 4.2 4.1 4.6  Chloride 98 - 111 mmol/L 104 103 105  CO2 22 - 32 mmol/L 29 25 28   Calcium 8.9 - 10.3 mg/dL 9.7 9.4  9.3  Total Protein 6.5 - 8.1 g/dL 7.1 7.1 7.2  Total Bilirubin 0.3 - 1.2 mg/dL 0.4 0.4 0.4  Alkaline Phos 38 - 126 U/L 59 61  69  AST 15 - 41 U/L 17 17 18   ALT 0 - 44 U/L 11 14 14    . Lab Results  Component Value Date   LDH 130 05/04/2020    Lymphnode biopsy, 02/11/2017 Diagnosis Lymph node for lymphoma, Mesenteric Lymphadenopathy - HODGKIN LYMPHOMA.         PROCEDURES  ECHO 03/13/17 Study Conclusions - Left ventricle: The cavity size was normal. Systolic function was  normal. The estimated ejection fraction was in the range of 55%  to 60%. Wall motion was normal; there were no regional wall  motion abnormalities. Left ventricular diastolic function  parameters were normal. - Atrial septum: No defect or patent foramen ovale was identified. - Impressions: Normal GLS -20.2.   RADIOGRAPHIC STUDIES: I have personally reviewed the radiological images as listed and agreed with the findings in the report. CT CHEST ABDOMEN PELVIS W CONTRAST  Result Date: 05/25/2020 CLINICAL DATA:  Hodgkin lymphoma with abdominal discomfort and night sweats. EXAM: CT CHEST, ABDOMEN, AND PELVIS WITH CONTRAST TECHNIQUE: Multidetector CT imaging of the chest, abdomen and pelvis was performed following the standard protocol during bolus administration of intravenous contrast. CONTRAST:  131mL OMNIPAQUE IOHEXOL 300 MG/ML  SOLN COMPARISON:  12/24/2019. FINDINGS: CT CHEST FINDINGS Cardiovascular: Vascular structures are unremarkable. Heart is enlarged. No pericardial effusion. Mediastinum/Nodes: Lymph nodes adjacent to the distal descending thoracic aorta measure up to 11 mm (2/45), as before. Otherwise, no pathologically enlarged mediastinal, hilar or axillary lymph nodes. Esophagus is unremarkable. Lungs/Pleura: 2 mm apical right upper lobe nodule (6/13), unchanged. Linear subpleural scarring in the anterior right upper lobe (6/51), stable. Lungs are otherwise clear. No pleural fluid. Airway is unremarkable.  Musculoskeletal: Degenerative changes in the spine. No worrisome lytic or sclerotic lesions. Soft tissue mass in the upper central right breast measures 2.1 x 4.1 cm, similar. CT ABDOMEN PELVIS FINDINGS Hepatobiliary: Liver is slightly enlarged, measuring 19.1 cm. Liver and gallbladder are otherwise unremarkable. No biliary ductal dilatation. Pancreas: Negative. Spleen: Normal in size, 8.7 cm. Adrenals/Urinary Tract: Adrenal glands are unremarkable. Right kidney is non rotated. Kidneys are otherwise unremarkable. Ureters are decompressed. Bladder is low in volume. Stomach/Bowel: Stomach, small bowel, appendix and colon are unremarkable. Vascular/Lymphatic: Vascular structures are unremarkable. Gastrohepatic ligament lymph nodes measure up to 10 mm, stable. Small bowel mesenteric adenopathy measures up to 2.1 cm (2/67) at the root, stable. Abdominal retroperitoneal lymph nodes measure up to 10 mm in the left periaortic station (2/71), also stable. No pelvic or inguinal adenopathy. Reproductive: Intrauterine contraceptive device is in place. No adnexal mass. Other: Trace pelvic free fluid. Mesenteries and peritoneum are otherwise unremarkable. Laxity/herniation of the ventral abdominal wall at the umbilicus. Musculoskeletal: No worrisome lytic or sclerotic lesions. IMPRESSION: 1. Stable adenopathy in the low chest and abdomen. No evidence of disease progression. Spleen is normal in size. 2. Mild hepatomegaly. 3. Right breast soft tissue mass, stable. Correlate with physical exam, mammogram and ultrasound, as clinically indicated. Electronically Signed   By: Lorin Picket M.D.   On: 05/25/2020 09:33      ASSESSMENT & PLAN:   39 y.o. 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 -PET/CT showed fairly active disease. Abdominal disease if progression occurs could potentially cause organ injury. -LDH level and sedimentation rate within normal limits . -HIV negative  -Hepatitis C negative.  -  Baseline ECHO from 03/13/17 shows normal heart function, EF 55%-60%  Rpt PET/CT on 05/29/2017 - showed Significant reduction in size and metabolic activity of the formerly extensive adenopathy in the chest and abdomen, with primarily Deauville 3 activity today, and some Deauville 2 activity. Previously this was Deauville 5.  S/p R-CHOP x 6 cycles  PET/CT 08/06/2017: No evidence active lymphoma with minimal activity within the remaining periaortic and mesenteric lymph nodes - Deauville 2   3) Patient Active Problem List   Diagnosis Date Noted  . Migraine 09/03/2017  . Counseling regarding advanced care planning and goals of care 06/24/2017  . Nodular lymphocyte predominant Hodgkin lymphoma of lymph nodes of multiple regions ([place]) 03/19/2017  . Mesenteric lymphadenopathy 02/11/2017  . Adenopathy 01/10/2017  . 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 Abnormal chest CT 01/07/2017  . History of panic attacks   . Bacterial pharyngitis 07/30/2016  . 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 -continue taking Vitamin B complex with meals to assist with this.     PLAN: -Discussed 05/24/2020 CT C/A/P ([id number]) which revealed "1. Stable adenopathy in the low chest and abdomen. No evidence of disease progression. Spleen is normal in size." -Advised pt that nml Sed Rate & LDH and stable findings on CT scan suggests no new or recurrent disease. -Advised pt that some of her symptoms could be caused by hormonal changes.  -Advised pt that the nodule along her chin is likely a  reactive lymph node and should resolve in 2-4 weeks - will continue to watch.  -Discussed CDC guidelines regarding the [place] booster. Recommend pt seek booster at this time. -Recommend pt contact her PCP if her cough and sore throat persist.  -Will see back in 4 months with labs   FOLLOW UP: RTC with Dr [name] with labs in 4 months   The total time spent in the appt was 20 minutes and more than 50% was on counseling and direct patient cares.  All of the patient's questions were answered with apparent satisfaction. The patient knows to call the clinic with any problems, questions or concerns.   Sullivan Lone MD [place] AAHIVMS [facility] Hematology/Oncology Physician [facility]  (Office):       [phone] (Work cell):  [phone] (Fax):           [phone]  I, Yevette Edwards, am acting as a scribe for Dr. Sullivan Lone.   .I have reviewed the above documentation for accuracy and completeness, and I agree with the above. Brunetta Genera MD

## 2020-09-26 NOTE — Progress Notes (Signed)
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   HISTORY OF PRESENTING ILLNESS:   Bonnie Larsen is a wonderful 40 y.o. female who has been referred to [place] by Dr .Patient, No Pcp Per (Inactive) for evaluation and management of thoracic paraspinal masses and upper abdominal extensive lymphadenopathy.  Patient has a history of uterine fibroids, right breast benign lesion biopsied in 2016, anxiety/panic attacks, migraine headaches and chronic sinus issues who presented to the emergency room brought by EMS for chest pain and shortness of breath about 7-10 days ago. She was noted to be hypertensive with a blood pressure of 180/110 which improved with nitroglycerin. She was also having some back pain and felt like her chest pain was radiating to the back. D-dimer level was borderline elevated. She had a CTA of the chest on 01/06/2017 that showed no evidence of pulmonary embolism. She was however noted to have Multiple prevertebral paraspinal masses, differential diagnosis includes extramedullary hematopoiesis and metastasis/lymphadenopathy. Recommend correlation for laboratory analysis for anemia and lymphoproliferative disease.  Patient was subsequently seen by her primary care physician and had a CT of the abdomen and pelvis with contrast on 01/09/2017 which showed Extensive upper abdominal adenopathy favoring lymphoma/lymphoproliferative disease, tissue diagnosis recommended. No splenomegaly or focal splenic lesion.  She was referred to [place] for further evaluation and management.  She was here with her mother and husband.  She notes no abdominal, or chest trauma. No fevers chills night sweats or unexpected weight loss. Mild constipation but no other  change in bowel habits. No GI bleeding noted. No change in food intake or swallowing. No urinary discomfort or abnormalities. Currently having no chest pain or shortness of breath. Mild upper abdominal discomfort. No other obvious peripheral lymphadenopathy noted.   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 The patient's last visit with [place] was on 05/31/2020. The pt reports that she is doing well overall.  The pt reports no acute new concerns.  No fevers no chills no night sweats no weight loss. No other acute new focal symptoms. Notes that she has very good energy levels. She notes that she has been talking to a counselor and this has helped significantly with her anxiety.  Lab results today 09/27/2020 of CBC w/diff and CMP and LDH are stable.  On review of systems, pt reports no other acute symptoms other than those noted above.  MEDICAL HISTORY:  Past Medical History:  Diagnosis Date  . Anxiety   . Difficult intravenous access   . Family history of adverse reaction to anesthesia    pt's mother has hx. of post-op N/V and hard to wake up post-op  . History of panic attacks    02/08/17- has not had a panic attacks  . Lymphoma ([place]) 01/2017  . Mesenteric lymphadenopathy 01/2017  . Morbid obesity with BMI of 40.0-44.9, adult ([place])   . Runny nose 02/19/2017   clear drainage, per pt.  . Seasonal allergies     SURGICAL HISTORY: Past Surgical History:  Procedure Laterality Date  . LYMPH NODE BIOPSY N/A 02/11/2017   Procedure: HAND ASSISTED LAPAROSCOPIC LYMPH  NODE BIOPSY;  Surgeon: Stark Klein, MD;  Location: [facility];  Service: General;  Laterality: N/A;  . PORT-A-CATH REMOVAL N/A 09/04/2017   Procedure: REMOVAL PORT-A-CATH;  Surgeon: Stark Klein, MD;  Location: [place] [facility];  Service: General;  Laterality: N/A;  . PORTACATH  PLACEMENT N/A 02/20/2017   Procedure: INSERTION PORT-A-CATH;  Surgeon: Stark Klein, MD;  Location: [facility];  Service: General;  Laterality: N/A;  . TUBAL LIGATION    . UMBILICAL HERNIA REPAIR  02/11/2017    SOCIAL HISTORY: Social History   Socioeconomic History  . Marital status: Married    Spouse name: Gwyndolyn Saxon  . Number of children: 2  . Years of education: Not on file  . Highest education level: Associate degree: occupational, Hotel manager, or vocational program  Occupational History  . Not on file  Tobacco Use  . Smoking status: Never Smoker  . Smokeless tobacco: Never Used  Vaping Use  . Vaping Use: Never used  Substance and Sexual Activity  . Alcohol use: No    Alcohol/week: 0.0 standard drinks  . Drug use: No  . Sexual activity: Yes    Partners: Male    Birth control/protection: I.U.D.    Comment: tubal ligation  Other Topics Concern  . Not on file  Social History Narrative  . Not on file   Social Determinants of Health   Financial Resource Strain: Not on file  Food Insecurity: Not on file  Transportation Needs: Not on file  Physical Activity: Not on file  Stress: Not on file  Social Connections: Not on file  Intimate Partner Violence: Not on file    FAMILY HISTORY: Family History  Problem Relation Age of Onset  . Breast cancer Mother 39  . Fibromyalgia Mother   . Rheum arthritis Mother   . Neuropathy Mother   . Anesthesia problems Mother        post-op N/V; hard to wake up post-op  . Congestive Heart Failure Father        died at age 62  . Cirrhosis Father   . Heart disease Maternal Grandmother   . Diabetes Maternal Grandmother   . Other Brother        Benign spinal tumor  . Cancer Paternal Grandfather        Leukemia    ALLERGIES:  is allergic to clindamycin/lincomycin and cymbalta [duloxetine hcl].  MEDICATIONS:  Current Outpatient Medications  Medication Sig Dispense Refill  . Biotin 10 MG TABS Take by mouth.    .  levonorgestrel (MIRENA) 20 MCG/24HR IUD 1 each by Intrauterine route once.    . Multiple Vitamin (MULTIVITAMIN) tablet Take 1 tablet by mouth daily.     No current facility-administered medications for this visit.   Facility-Administered Medications Ordered in Other Visits  Medication Dose Route Frequency Provider Last Rate Last Admin  . sodium chloride flush (NS) 0.9 % injection 10 mL  10 mL Intravenous PRN Brunetta Genera, MD   10 mL at 04/16/17 1938    REVIEW OF SYSTEMS:  10 Point review of Systems was done is negative except as noted above.  PHYSICAL EXAMINATION:  ECOG FS:0 - Asymptomatic  There were no vitals filed for this visit. Wt Readings from Last 3 Encounters:  05/04/20 256 lb 3.2 oz (116.2 kg)  12/31/19 256 lb 8 oz (116.3 kg)  08/27/19 253 lb 12.8 oz (115.1 kg)   There is no height or weight on file to calculate BMI.     ECOG FS:0 - Asymptomatic  There were no vitals filed for this visit. Wt Readings from Last 3 Encounters:  05/04/20 256 lb 3.2 oz (116.2 kg)  12/31/19 256 lb 8 oz (116.3 kg)  08/27/19 253 lb 12.8 oz (115.1 kg)   There is no height or weight on file to calculate BMI.    NAD GENERAL:alert, in no acute distress and comfortable SKIN: no acute rashes, no significant lesions EYES: conjunctiva are pink and non-injected, sclera anicteric OROPHARYNX: MMM, no exudates, no oropharyngeal erythema or ulceration NECK: supple, no JVD LYMPH:  no palpable lymphadenopathy in the cervical, axillary or inguinal regions LUNGS: clear to auscultation b/l with normal respiratory effort HEART: regular rate & rhythm ABDOMEN:  normoactive bowel sounds , non tender, not distended. Extremity: no pedal edema PSYCH: alert & oriented x 3 with fluent speech NEURO: no focal motor/sensory deficits  LABORATORY DATA:  I have reviewed the data as listed   . CBC Latest Ref Rng & Units 09/27/2020 05/04/2020 12/24/2019  WBC 4.0 - 10.5 K/uL 5.1 5.4 5.0  Hemoglobin 12.0 -  15.0 g/dL 14.1 13.2 14.0  Hematocrit 36.0 - 46.0 % 44.4 42.2 43.8  Platelets 150 - 400 K/uL 224 231 234     . CMP Latest Ref Rng & Units 09/27/2020 05/04/2020 12/24/2019  Glucose 70 - 99 mg/dL 151(H) 98 87  BUN 6 - 20 mg/dL 9 10 8   Creatinine 0.44 - 1.00 mg/dL 1.06(H) 1.12(H) 0.90  Sodium 135 - 145 mmol/L 143 142 139  Potassium 3.5 - 5.1 mmol/L 4.1 4.2 4.1  Chloride 98 - 111 mmol/L 105 104 103  CO2 22 - 32 mmol/L 24 29 25   Calcium 8.9 - 10.3 mg/dL 8.9 9.7 9.4  Total Protein 6.5 - 8.1 g/dL 6.7 7.1 7.1  Total Bilirubin 0.3 - 1.2 mg/dL 0.5 0.4 0.4  Alkaline Phos 38 - 126 U/L 54 59 61  AST 15 - 41 U/L 15 17 17   ALT 0 - 44 U/L 12 11 14    . Lab Results  Component Value Date   LDH 136 09/27/2020    Lymphnode biopsy, 02/11/2017 Diagnosis Lymph node for lymphoma, Mesenteric Lymphadenopathy - HODGKIN LYMPHOMA.         PROCEDURES  ECHO 03/13/17 Study Conclusions - Left ventricle: The cavity size was normal. Systolic function was  normal. The estimated ejection fraction was in the range of 55%  to 60%. Wall motion was normal; there were no regional wall  motion abnormalities. Left ventricular diastolic function  parameters were normal. - Atrial septum: No defect or patent foramen ovale was identified. - Impressions: Normal GLS -20.2.   RADIOGRAPHIC STUDIES: I have personally reviewed the radiological images as listed and agreed with the findings in the report. No results found.    ASSESSMENT & PLAN:   40 y.o. 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 -PET/CT showed fairly active disease. Abdominal disease if progression occurs could potentially cause organ injury. -LDH level and sedimentation rate within normal  limits . -HIV negative  -Hepatitis C negative.  -Baseline ECHO from 03/13/17 shows normal heart function, EF 55%-60%  Rpt PET/CT on 05/29/2017 - showed Significant reduction in size and metabolic activity of the formerly extensive adenopathy in the chest and abdomen, with primarily Deauville 3 activity today, and some Deauville 2 activity. Previously this was Deauville 5.  S/p R-CHOP x 6 cycles  PET/CT 08/06/2017: No evidence active lymphoma with minimal activity within the remaining periaortic and mesenteric lymph nodes - Deauville 2   3) Patient Active Problem List   Diagnosis Date Noted  . Migraine 09/03/2017  . Counseling regarding advanced care planning and goals of care 06/24/2017  . Nodular lymphocyte predominant Hodgkin lymphoma of lymph nodes of multiple regions ([place]) 03/19/2017  . Mesenteric lymphadenopathy 02/11/2017  . Adenopathy 01/10/2017  . 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 Abnormal chest CT 01/07/2017  . History of panic attacks   . Bacterial pharyngitis 07/30/2016  . 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 -continue taking Vitamin B complex with meals to assist with this.     PLAN: -Discussed pt labwork today, 09/27/2020; stable -No lab or clinical evidence for lymphoma recurrence/progression at this time. -Neuropathy has mostly resolved. -Her anxiety has improved with counseling. -Discussed maintaining active lifestyle and focusing on weight loss. -Will see back in 6 months   FOLLOW UP: RTC with Dr [name] with labs in 6 months   The total time spent in the appt was 20 minutes and more than 50% was on counseling and direct patient cares.  All of the patient's questions were answered with apparent satisfaction. The patient knows to call the clinic with any problems,  questions or concerns.   Sullivan Lone MD [place] AAHIVMS [facility] Hematology/Oncology Physician [facility]   (Office):       [phone] (Work cell):  [phone] (Fax):           [phone]  I, Reinaldo Raddle, am acting as scribe for Dr. Sullivan Lone, MD.     .I have reviewed the above documentation for accuracy and completeness, and I agree with the above. Brunetta Genera MD

## 2020-09-27 ENCOUNTER — Inpatient Hospital Stay (HOSPITAL_BASED_OUTPATIENT_CLINIC_OR_DEPARTMENT_OTHER): Payer: 59 | Admitting: Hematology

## 2020-09-27 ENCOUNTER — Inpatient Hospital Stay: Payer: 59 | Attending: Hematology

## 2020-09-27 ENCOUNTER — Other Ambulatory Visit: Payer: Self-pay

## 2020-09-27 VITALS — BP 130/67 | HR 94 | Temp 97.7°F | Resp 20 | Ht 63.0 in | Wt 255.9 lb

## 2020-09-27 DIAGNOSIS — Z8269 Family history of other diseases of the musculoskeletal system and connective tissue: Secondary | ICD-10-CM | POA: Diagnosis not present

## 2020-09-27 DIAGNOSIS — F419 Anxiety disorder, unspecified: Secondary | ICD-10-CM | POA: Insufficient documentation

## 2020-09-27 DIAGNOSIS — C8108 Nodular lymphocyte predominant Hodgkin lymphoma, lymph nodes of multiple sites: Secondary | ICD-10-CM

## 2020-09-27 DIAGNOSIS — Z806 Family history of leukemia: Secondary | ICD-10-CM | POA: Insufficient documentation

## 2020-09-27 DIAGNOSIS — Z820 Family history of epilepsy and other diseases of the nervous system: Secondary | ICD-10-CM | POA: Diagnosis not present

## 2020-09-27 DIAGNOSIS — Z833 Family history of diabetes mellitus: Secondary | ICD-10-CM | POA: Insufficient documentation

## 2020-09-27 DIAGNOSIS — Z803 Family history of malignant neoplasm of breast: Secondary | ICD-10-CM | POA: Insufficient documentation

## 2020-09-27 DIAGNOSIS — Z8379 Family history of other diseases of the digestive system: Secondary | ICD-10-CM | POA: Diagnosis not present

## 2020-09-27 DIAGNOSIS — Z8249 Family history of ischemic heart disease and other diseases of the circulatory system: Secondary | ICD-10-CM | POA: Insufficient documentation

## 2020-09-27 DIAGNOSIS — Z8261 Family history of arthritis: Secondary | ICD-10-CM | POA: Diagnosis not present

## 2020-09-27 DIAGNOSIS — Z79899 Other long term (current) drug therapy: Secondary | ICD-10-CM | POA: Diagnosis not present

## 2020-09-27 LAB — CBC WITH DIFFERENTIAL/PLATELET
Abs Immature Granulocytes: 0.01 10*3/uL (ref 0.00–0.07)
Basophils Absolute: 0 10*3/uL (ref 0.0–0.1)
Basophils Relative: 0 %
Eosinophils Absolute: 0.1 10*3/uL (ref 0.0–0.5)
Eosinophils Relative: 1 %
HCT: 44.4 % (ref 36.0–46.0)
Hemoglobin: 14.1 g/dL (ref 12.0–15.0)
Immature Granulocytes: 0 %
Lymphocytes Relative: 21 %
Lymphs Abs: 1.1 10*3/uL (ref 0.7–4.0)
MCH: 25.5 pg — ABNORMAL LOW (ref 26.0–34.0)
MCHC: 31.8 g/dL (ref 30.0–36.0)
MCV: 80.1 fL (ref 80.0–100.0)
Monocytes Absolute: 0.3 10*3/uL (ref 0.1–1.0)
Monocytes Relative: 6 %
Neutro Abs: 3.7 10*3/uL (ref 1.7–7.7)
Neutrophils Relative %: 72 %
Platelets: 224 10*3/uL (ref 150–400)
RBC: 5.54 MIL/uL — ABNORMAL HIGH (ref 3.87–5.11)
RDW: 13.9 % (ref 11.5–15.5)
WBC: 5.1 10*3/uL (ref 4.0–10.5)
nRBC: 0 % (ref 0.0–0.2)

## 2020-09-27 LAB — CMP (CANCER CENTER ONLY)
ALT: 12 U/L (ref 0–44)
AST: 15 U/L (ref 15–41)
Albumin: 3.8 g/dL (ref 3.5–5.0)
Alkaline Phosphatase: 54 U/L (ref 38–126)
Anion gap: 14 (ref 5–15)
BUN: 9 mg/dL (ref 6–20)
CO2: 24 mmol/L (ref 22–32)
Calcium: 8.9 mg/dL (ref 8.9–10.3)
Chloride: 105 mmol/L (ref 98–111)
Creatinine: 1.06 mg/dL — ABNORMAL HIGH (ref 0.44–1.00)
GFR, Estimated: >60 mL/min (ref >60)
Glucose, Bld: 151 mg/dL — ABNORMAL HIGH (ref 70–99)
Potassium: 4.1 mmol/L (ref 3.5–5.1)
Sodium: 143 mmol/L (ref 135–145)
Total Bilirubin: 0.5 mg/dL (ref 0.3–1.2)
Total Protein: 6.7 g/dL (ref 6.5–8.1)

## 2020-09-27 LAB — LACTATE DEHYDROGENASE: LDH: 136 U/L (ref 98–192)

## 2021-01-13 ENCOUNTER — Other Ambulatory Visit: Payer: Self-pay

## 2021-01-13 ENCOUNTER — Emergency Department (HOSPITAL_BASED_OUTPATIENT_CLINIC_OR_DEPARTMENT_OTHER)
Admission: EM | Admit: 2021-01-13 | Discharge: 2021-01-13 | Disposition: A | Discharge status: Home / Self Care | Payer: 59 | Attending: Emergency Medicine | Admitting: Emergency Medicine

## 2021-01-13 DIAGNOSIS — H60502 Unspecified acute noninfective otitis externa, left ear: Secondary | ICD-10-CM

## 2021-01-13 DIAGNOSIS — H6092 Unspecified otitis externa, left ear: Secondary | ICD-10-CM | POA: Insufficient documentation

## 2021-01-13 DIAGNOSIS — H9202 Otalgia, left ear: Secondary | ICD-10-CM | POA: Diagnosis present

## 2021-01-13 MED ORDER — AMOXICILLIN-POT CLAVULANATE 875-125 MG PO TABS: 1.0000 | ORAL_TABLET | Freq: Two times a day (BID) | ORAL | 0 refills | Status: AC

## 2021-01-13 MED ORDER — CIPRO HC 0.2-1 % OT SUSP: 3.0000 [drp] | Freq: Two times a day (BID) | OTIC | 0 refills | Status: AC

## 2021-01-13 NOTE — Discharge Instructions (Addendum)
Take the antibiotics as prescribed for your ear.  Follow-up with your doctor or an ENT doctor for further evaluation of the symptoms persist

## 2021-01-13 NOTE — ED Notes (Signed)
Pt verbalizes understanding of discharge instructions. Opportunity for questioning and answers were provided. Armand removed by staff, pt discharged from ED to home. Educated to pick up Rx.  

## 2021-01-13 NOTE — ED Notes (Signed)
Pt left without discharge papers. 

## 2021-01-13 NOTE — ED Provider Notes (Signed)
[facility] EMERGENCY DEPT Provider Note   CSN: [id number] Arrival date & time: 01/13/21  1905     History Chief Complaint  Patient presents with   Ear Fullness    left    Bonnie Larsen is a 40 y.o. female.   Ear Fullness   Patient presented to the ED for evaluation of left ear pain.  Patient states she had symptoms about 10 days ago with some sinus drainage and ear discomfort.  She saw her doctor and was diagnosed with a bilateral ear infection.  Patient states she completed her course of amoxicillin.  Patient states the right ear got better but her left ear is still painful.  She cannot hear out of it.  She had some fever earlier in the week.  No sore throat.  No difficulty breathing.  Past Medical History:  Diagnosis Date   Anxiety    Difficult intravenous access    Family history of adverse reaction to anesthesia    pt's mother has hx. of post-op N/V and hard to wake up post-op   History of panic attacks    02/08/17- has not had a panic attacks   Lymphoma ([place]) 01/2017   Mesenteric lymphadenopathy 01/2017   Morbid obesity with BMI of 40.0-44.9, adult ([place])    Runny nose 02/19/2017   clear drainage, per pt.   Seasonal allergies     Patient Active Problem List   Diagnosis Date Noted   Migraine 09/03/2017   Counseling regarding advanced care planning and goals of care 06/24/2017   Nodular lymphocyte predominant Hodgkin lymphoma of lymph nodes of multiple regions ([place]) 03/19/2017   Mesenteric lymphadenopathy 02/11/2017   Adenopathy 01/10/2017   Paraspinal mass 01/07/2017   Abnormal chest CT 01/07/2017   History of panic attacks    Bacterial pharyngitis 07/30/2016   Obesity, Class II, BMI 35-39.9, isolated 04/25/2015    Past Surgical History:  Procedure Laterality Date   LYMPH NODE BIOPSY N/A 02/11/2017   Procedure: HAND ASSISTED LAPAROSCOPIC LYMPH NODE BIOPSY;  Surgeon: Stark Klein, MD;  Location: [facility];  Service: General;  Laterality: N/A;    PORT-A-CATH REMOVAL N/A 09/04/2017   Procedure: REMOVAL PORT-A-CATH;  Surgeon: Stark Klein, MD;  Location: [facility];  Service: General;  Laterality: N/A;   PORTACATH PLACEMENT N/A 02/20/2017   Procedure: INSERTION PORT-A-CATH;  Surgeon: Stark Klein, MD;  Location: [facility];  Service: General;  Laterality: N/A;   TUBAL LIGATION     UMBILICAL HERNIA REPAIR  02/11/2017     OB History     Gravida  4   Para  2   Term  2   Preterm      AB  2   Living  2      SAB      IAB      Ectopic      Multiple      Live Births  2           Family History  Problem Relation Age of Onset   Breast cancer Mother 68   Fibromyalgia Mother    Rheum arthritis Mother    Neuropathy Mother    Anesthesia problems Mother        post-op N/V; hard to wake up post-op   Congestive Heart Failure Father        died at age 54   Cirrhosis Father    Heart disease Maternal Grandmother    Diabetes Maternal Grandmother    Other Brother  Benign spinal tumor   Cancer Paternal Grandfather        Leukemia    Social History   Tobacco Use   Smoking status: Never   Smokeless tobacco: Never  Vaping Use   Vaping Use: Never used  Substance Use Topics   Alcohol use: No    Alcohol/week: 0.0 standard drinks   Drug use: No    Home Medications Prior to Admission medications   Medication Sig Start Date End Date Taking? Authorizing Provider  amoxicillin-clavulanate (AUGMENTIN) 875-125 MG tablet Take 1 tablet by mouth 2 (two) times daily for 7 days. 01/13/21 01/20/21 Yes Dorie Rank, MD  ciprofloxacin-hydrocortisone (CIPRO [facility]) OTIC suspension Place 3 drops into the left ear 2 (two) times daily for 7 days. 01/13/21 01/20/21 Yes Dorie Rank, MD  Biotin 10 MG TABS Take by mouth.    [provider]  levonorgestrel (MIRENA) 20 MCG/24HR IUD 1 each by Intrauterine route once.    [provider]  Multiple Vitamin (MULTIVITAMIN) tablet Take 1 tablet by mouth daily.     [provider]    Allergies    Clindamycin/lincomycin and Cymbalta [duloxetine hcl]  Review of Systems   Review of Systems  All other systems reviewed and are negative.  Physical Exam Updated Vital Signs BP 124/81 (BP Location: Right Arm)   Pulse 88   Temp 97.9 F (36.6 C) (Oral)   Resp 17   Ht 1.626 m ('5\' 4"'$ )   Wt 115.2 kg   SpO2 100%   BMI 43.60 kg/m   Physical Exam Vitals and nursing note reviewed.  Constitutional:      General: She is not in acute distress.    Appearance: She is well-developed.  HENT:     Head: Normocephalic and atraumatic.     Right Ear: Tympanic membrane, ear canal and external ear normal.     Left Ear: External ear normal.     Ears:     Comments: Erythema and drainage from the left external auditory canal, left TM difficult to visualize Eyes:     General: No scleral icterus.       Right eye: No discharge.        Left eye: No discharge.     Conjunctiva/sclera: Conjunctivae normal.  Neck:     Trachea: No tracheal deviation.  Cardiovascular:     Rate and Rhythm: Normal rate and regular rhythm.  Pulmonary:     Effort: Pulmonary effort is normal. No respiratory distress.     Breath sounds: Normal breath sounds. No stridor.  Abdominal:     General: There is no distension.  Musculoskeletal:        General: No swelling or deformity.     Cervical back: Neck supple.  Skin:    General: Skin is warm and dry.     Findings: No rash.  Neurological:     Mental Status: She is alert.     Cranial Nerves: Cranial nerve deficit: no gross deficits.    ED Results / Procedures / Treatments   Labs (all labs ordered are listed, but only abnormal results are displayed) Labs Reviewed - No data to display  EKG None  Radiology No results found.  Procedures Procedures   Medications Ordered in ED Medications - No data to display  ED Course  I have reviewed the triage vital signs and the nursing notes.  Pertinent labs & imaging results  that were available during my care of the patient were reviewed by me and considered  in my medical decision making (see chart for details).    MDM Rules/Calculators/A&P                           Patient's exam is consistent with otitis externa.  She did have otitis media recently and I cannot visualize the TM so I will start her on a course of Augmentin.  Ciprodex for her otitis externa.  Discussed outpatient follow-up with PCP or ENT Final Clinical Impression(s) / ED Diagnoses Final diagnoses:  Acute otitis externa of left ear, unspecified type    Rx / DC Orders ED Discharge Orders          Ordered    amoxicillin-clavulanate (AUGMENTIN) 875-125 MG tablet  2 times daily        01/13/21 2302    ciprofloxacin-hydrocortisone (CIPRO [facility]) OTIC suspension  2 times daily        01/13/21 2302             Dorie Rank, MD 01/13/21 2304

## 2021-01-13 NOTE — ED Triage Notes (Signed)
Pt to ED from home with c/o ear fullness/infection in left ear after pt had appointment with primary care and was started on amoxicillin x10 days for sinus drainage and ear drainage. Pt states that her sinuses and right ear symptoms have resolved but feels like she cant hear out of her left ear. Pt reports no dizziness, N/V but states it is painful.

## 2021-02-09 ENCOUNTER — Ambulatory Visit: Payer: 59 | Admitting: Internal Medicine

## 2021-03-28 ENCOUNTER — Inpatient Hospital Stay (HOSPITAL_BASED_OUTPATIENT_CLINIC_OR_DEPARTMENT_OTHER): Payer: 59 | Admitting: Hematology

## 2021-03-28 ENCOUNTER — Other Ambulatory Visit: Payer: Self-pay

## 2021-03-28 ENCOUNTER — Inpatient Hospital Stay: Payer: 59 | Attending: Hematology

## 2021-03-28 VITALS — BP 127/72 | Temp 98.8°F | Resp 20 | Ht 63.0 in | Wt 256.8 lb

## 2021-03-28 DIAGNOSIS — Z8269 Family history of other diseases of the musculoskeletal system and connective tissue: Secondary | ICD-10-CM | POA: Insufficient documentation

## 2021-03-28 DIAGNOSIS — Z8379 Family history of other diseases of the digestive system: Secondary | ICD-10-CM | POA: Diagnosis not present

## 2021-03-28 DIAGNOSIS — F419 Anxiety disorder, unspecified: Secondary | ICD-10-CM | POA: Diagnosis not present

## 2021-03-28 DIAGNOSIS — Z803 Family history of malignant neoplasm of breast: Secondary | ICD-10-CM | POA: Insufficient documentation

## 2021-03-28 DIAGNOSIS — K5900 Constipation, unspecified: Secondary | ICD-10-CM | POA: Insufficient documentation

## 2021-03-28 DIAGNOSIS — R109 Unspecified abdominal pain: Secondary | ICD-10-CM | POA: Diagnosis not present

## 2021-03-28 DIAGNOSIS — Z833 Family history of diabetes mellitus: Secondary | ICD-10-CM | POA: Insufficient documentation

## 2021-03-28 DIAGNOSIS — G629 Polyneuropathy, unspecified: Secondary | ICD-10-CM | POA: Insufficient documentation

## 2021-03-28 DIAGNOSIS — C8108 Nodular lymphocyte predominant Hodgkin lymphoma, lymph nodes of multiple sites: Secondary | ICD-10-CM | POA: Diagnosis not present

## 2021-03-28 DIAGNOSIS — G43909 Migraine, unspecified, not intractable, without status migrainosus: Secondary | ICD-10-CM | POA: Insufficient documentation

## 2021-03-28 DIAGNOSIS — Z806 Family history of leukemia: Secondary | ICD-10-CM | POA: Insufficient documentation

## 2021-03-28 DIAGNOSIS — Z8249 Family history of ischemic heart disease and other diseases of the circulatory system: Secondary | ICD-10-CM | POA: Insufficient documentation

## 2021-03-28 DIAGNOSIS — Z79899 Other long term (current) drug therapy: Secondary | ICD-10-CM | POA: Insufficient documentation

## 2021-03-28 LAB — CBC WITH DIFFERENTIAL/PLATELET
Abs Immature Granulocytes: 0.01 10*3/uL (ref 0.00–0.07)
Basophils Absolute: 0 10*3/uL (ref 0.0–0.1)
Basophils Relative: 0 %
Eosinophils Absolute: 0.1 10*3/uL (ref 0.0–0.5)
Eosinophils Relative: 2 %
HCT: 44.2 % (ref 36.0–46.0)
Hemoglobin: 14 g/dL (ref 12.0–15.0)
Immature Granulocytes: 0 %
Lymphocytes Relative: 26 %
Lymphs Abs: 1.5 10*3/uL (ref 0.7–4.0)
MCH: 25.5 pg — ABNORMAL LOW (ref 26.0–34.0)
MCHC: 31.7 g/dL (ref 30.0–36.0)
MCV: 80.7 fL (ref 80.0–100.0)
Monocytes Absolute: 0.5 10*3/uL (ref 0.1–1.0)
Monocytes Relative: 9 %
Neutro Abs: 3.7 10*3/uL (ref 1.7–7.7)
Neutrophils Relative %: 63 %
Platelets: 266 10*3/uL (ref 150–400)
RBC: 5.48 MIL/uL — ABNORMAL HIGH (ref 3.87–5.11)
RDW: 13.6 % (ref 11.5–15.5)
WBC: 5.9 10*3/uL (ref 4.0–10.5)
nRBC: 0 % (ref 0.0–0.2)

## 2021-03-28 LAB — CMP (CANCER CENTER ONLY)
ALT: 12 U/L (ref 0–44)
AST: 18 U/L (ref 15–41)
Albumin: 4.2 g/dL (ref 3.5–5.0)
Alkaline Phosphatase: 58 U/L (ref 38–126)
Anion gap: 11 (ref 5–15)
BUN: 14 mg/dL (ref 6–20)
CO2: 26 mmol/L (ref 22–32)
Calcium: 9.9 mg/dL (ref 8.9–10.3)
Chloride: 107 mmol/L (ref 98–111)
Creatinine: 1.15 mg/dL — ABNORMAL HIGH (ref 0.44–1.00)
GFR, Estimated: >60 mL/min (ref >60)
Glucose, Bld: 94 mg/dL (ref 70–99)
Potassium: 4.2 mmol/L (ref 3.5–5.1)
Sodium: 144 mmol/L (ref 135–145)
Total Bilirubin: 0.3 mg/dL (ref 0.3–1.2)
Total Protein: 7.4 g/dL (ref 6.5–8.1)

## 2021-03-28 LAB — LACTATE DEHYDROGENASE: LDH: 224 U/L — ABNORMAL HIGH (ref 98–192)

## 2021-03-28 MED ORDER — SACCHAROMYCES BOULARDII 500 MG PO PACK: 500.0000 mg | PACK | Freq: Two times a day (BID) | ORAL | 0 refills | Status: AC

## 2021-03-29 ENCOUNTER — Telehealth: Payer: Self-pay | Admitting: Hematology

## 2021-03-29 NOTE — Telephone Encounter (Signed)
Scheduled follow-up appointment per 10/4 los. Patient is aware. 

## 2021-04-03 ENCOUNTER — Encounter: Payer: Self-pay | Admitting: Hematology

## 2021-04-03 NOTE — Progress Notes (Signed)
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   HISTORY OF PRESENTING ILLNESS:   Bonnie Larsen is a wonderful 40 y.o. female who has been referred to [place] by Dr .Patient, No Pcp Per (Inactive) for evaluation and management of thoracic paraspinal masses and upper abdominal extensive lymphadenopathy.  Patient has a history of uterine fibroids, right breast benign lesion biopsied in 2016, anxiety/panic attacks, migraine headaches and chronic sinus issues who presented to the emergency room brought by EMS for chest pain and shortness of breath about 7-10 days ago. She was noted to be hypertensive with a blood pressure of 180/110 which improved with nitroglycerin. She was also having some back pain and felt like her chest pain was radiating to the back. D-dimer level was borderline elevated. She had a CTA of the chest on 01/06/2017 that showed no evidence of pulmonary embolism. She was however noted to have Multiple prevertebral paraspinal masses, differential diagnosis includes extramedullary hematopoiesis and metastasis/lymphadenopathy. Recommend correlation for laboratory analysis for anemia and lymphoproliferative disease.  Patient was subsequently seen by her primary care physician and had a CT of the abdomen and pelvis with contrast on 01/09/2017 which showed Extensive upper abdominal adenopathy favoring lymphoma/lymphoproliferative disease, tissue diagnosis recommended. No splenomegaly or focal splenic lesion.  She was referred to [place] for further evaluation and management.  She was here with her mother and husband.  She notes no abdominal, or chest trauma. No fevers chills night sweats or unexpected weight loss. Mild constipation but no other  change in bowel habits. No GI bleeding noted. No change in food intake or swallowing. No urinary discomfort or abnormalities. Currently having no chest pain or shortness of breath. Mild upper abdominal discomfort. No other obvious peripheral lymphadenopathy noted.   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 The patient's last visit with [place] was on 09/27/2020.   She notes that she has had some issues with acute frontal sinusitis with a persistent postnasal drip and cough in July and then also had acute ear infection later in the same month requiring antibiotic treatment. Patient notes most of the symptoms of URI have resolved.  No fevers no chills no night sweats no other acute focal symptoms at this time. Good p.o. intake.. She notes her anxiety has been under reasonable control. No new lumps or bumps noted. No new shortness of breath. No abdominal pain or distention  Lab results today 03/28/2021 CBC with differential within normal limits, CMP within normal limits, LDH 224.   MEDICAL HISTORY:  Past Medical History:  Diagnosis Date   Anxiety    Difficult intravenous access    Family history of adverse reaction to anesthesia    pt's mother has hx. of post-op N/V and hard to wake up post-op   History of panic attacks    02/08/17- has not had a panic attacks   Lymphoma ([place]) 01/2017   Mesenteric lymphadenopathy 01/2017   Morbid obesity with BMI of 40.0-44.9, adult ([place])    Runny nose 02/19/2017   clear drainage, per pt.   Seasonal allergies     SURGICAL HISTORY: Past Surgical History:  Procedure Laterality Date   LYMPH NODE BIOPSY N/A 02/11/2017   Procedure: HAND ASSISTED LAPAROSCOPIC LYMPH NODE BIOPSY;  Surgeon: Stark Klein, MD;  Location: [place];  Service: General;  Laterality: N/A;   PORT-A-CATH REMOVAL N/A  09/04/2017   Procedure: REMOVAL PORT-A-CATH;  Surgeon: Stark Klein, MD;  Location: [place];  Service: General;  Laterality: N/A;   PORTACATH PLACEMENT N/A 02/20/2017   Procedure: INSERTION PORT-A-CATH;  Surgeon: Stark Klein, MD;  Location: [place];  Service: General;  Laterality: N/A;   TUBAL LIGATION     UMBILICAL HERNIA REPAIR  02/11/2017    SOCIAL HISTORY: Social History   Socioeconomic History   Marital status: Married    Spouse name: Gwyndolyn Saxon   Number of children: 2   Years of education: Not on file   Highest education level: Associate degree: occupational, Hotel manager, or vocational program  Occupational History   Not on file  Tobacco Use   Smoking status: Never   Smokeless tobacco: Never  Vaping Use   Vaping Use: Never used  Substance and Sexual Activity   Alcohol use: No    Alcohol/week: 0.0 standard drinks   Drug use: No   Sexual activity: Yes    Partners: Male    Birth control/protection: I.U.D.    Comment: tubal ligation  Other Topics Concern   Not on file  Social History Narrative   Not on file   Social Determinants of Health   Financial Resource Strain: Not on file  Food Insecurity: Not on file  Transportation Needs: Not on file  Physical Activity: Not on file  Stress: Not on file  Social Connections: Not on file  Intimate Partner Violence: Not on file    FAMILY HISTORY: Family History  Problem Relation Age of Onset   Breast cancer Mother 64   Fibromyalgia Mother    Rheum arthritis Mother    Neuropathy Mother    Anesthesia problems Mother        post-op N/V; hard to wake up post-op   Congestive Heart Failure Father        died at age 11   Cirrhosis Father    Heart disease Maternal Grandmother    Diabetes Maternal Grandmother    Other Brother        Benign spinal tumor   Cancer Paternal Grandfather        Leukemia    ALLERGIES:  is allergic to clindamycin/lincomycin and cymbalta [duloxetine hcl].  MEDICATIONS:  Current  Outpatient Medications  Medication Sig Dispense Refill   levonorgestrel (MIRENA) 20 MCG/24HR IUD 1 each by Intrauterine route once.     Saccharomyces boulardii 500 MG PACK Take 500 mg by mouth in the morning and at bedtime for 15 days. 30 each 0   Biotin 10 MG TABS Take by mouth. (Patient not taking: Reported on 03/28/2021)     Multiple Vitamin (MULTIVITAMIN) tablet Take 1 tablet by mouth daily. (Patient not taking: Reported on 03/28/2021)     No current facility-administered medications for this visit.   Facility-Administered Medications Ordered in Other Visits  Medication Dose Route Frequency Provider Last Rate Last Admin   sodium chloride flush (NS) 0.9 % injection 10 mL  10 mL Intravenous PRN Brunetta Genera, MD   10 mL at 04/16/17 1938    REVIEW OF SYSTEMS:  .10 Point review of Systems was done is negative except as noted above.   PHYSICAL EXAMINATION:  ECOG FS:0 - Asymptomatic  Vitals:   03/28/21 1400  BP:  127/72  Resp: 20  Temp: 98.8 F (37.1 C)  SpO2: 99%   Wt Readings from Last 3 Encounters:  03/28/21 256 lb 12.8 oz (116.5 kg)  01/13/21 254 lb (115.2 kg)  09/27/20 255 lb 14.4 oz (116.1 kg)   Body mass index is 45.49 kg/m.     ECOG FS:0 - Asymptomatic  Vitals:   03/28/21 1400  BP: 127/72  Resp: 20  Temp: 98.8 F (37.1 C)  SpO2: 99%   Wt Readings from Last 3 Encounters:  03/28/21 256 lb 12.8 oz (116.5 kg)  01/13/21 254 lb (115.2 kg)  09/27/20 255 lb 14.4 oz (116.1 kg)   Body mass index is 45.49 kg/m.   Marland Kitchen GENERAL:alert, in no acute distress and comfortable SKIN: no acute rashes, no significant lesions EYES: conjunctiva are pink and non-injected, sclera anicteric OROPHARYNX: MMM, no exudates, no oropharyngeal erythema or ulceration NECK: supple, no JVD LYMPH:  no palpable lymphadenopathy in the cervical, axillary or inguinal regions LUNGS: clear to auscultation b/l with normal respiratory effort HEART: regular rate & rhythm ABDOMEN:  normoactive  bowel sounds , non tender, not distended. Extremity: no pedal edema PSYCH: alert & oriented x 3 with fluent speech NEURO: no focal motor/sensory deficits  LABORATORY DATA:  I have reviewed the data as listed  CBC Latest Ref Rng & Units 03/28/2021 09/27/2020 05/04/2020  WBC 4.0 - 10.5 K/uL 5.9 5.1 5.4  Hemoglobin 12.0 - 15.0 g/dL 14.0 14.1 13.2  Hematocrit 36.0 - 46.0 % 44.2 44.4 42.2  Platelets 150 - 400 K/uL 266 224 231     . CMP Latest Ref Rng & Units 03/28/2021 09/27/2020 05/04/2020  Glucose 70 - 99 mg/dL 94 151(H) 98  BUN 6 - 20 mg/dL 14 9 10   Creatinine 0.44 - 1.00 mg/dL 1.15(H) 1.06(H) 1.12(H)  Sodium 135 - 145 mmol/L 144 143 142  Potassium 3.5 - 5.1 mmol/L 4.2 4.1 4.2  Chloride 98 - 111 mmol/L 107 105 104  CO2 22 - 32 mmol/L 26 24 29   Calcium 8.9 - 10.3 mg/dL 9.9 8.9 9.7  Total Protein 6.5 - 8.1 g/dL 7.4 6.7 7.1  Total Bilirubin 0.3 - 1.2 mg/dL 0.3 0.5 0.4  Alkaline Phos 38 - 126 U/L 58 54 59  AST 15 - 41 U/L 18 15 17   ALT 0 - 44 U/L 12 12 11    . Lab Results  Component Value Date   LDH 224 (H) 03/28/2021    Lymphnode biopsy, 02/11/2017 Diagnosis Lymph node for lymphoma, Mesenteric Lymphadenopathy - HODGKIN LYMPHOMA.         PROCEDURES  ECHO 03/13/17 Study Conclusions  - Left ventricle: The cavity size was normal. Systolic function was  normal. The estimated ejection fraction was in the range of 55%  to 60%. Wall motion was normal; there were no regional wall  motion abnormalities. Left ventricular diastolic function  parameters were normal. - Atrial septum: No defect or patent foramen ovale was identified. - Impressions: Normal GLS -20.2.   RADIOGRAPHIC STUDIES: I have personally reviewed the radiological images as listed and agreed with the findings in the report. No results found.    ASSESSMENT & PLAN:   40 y.o. 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 -PET/CT showed fairly active disease. -LDH level and sedimentation rate within normal limits . -HIV negative  -  Hepatitis C negative.  -Baseline ECHO from 03/13/17 shows normal heart function, EF 55%-60%  Rpt PET/CT on 05/29/2017 - showed Significant reduction in size and metabolic activity of the formerly extensive adenopathy in the chest and abdomen, with primarily Deauville 3 activity today, and some Deauville 2 activity. Previously this was Deauville 5.  S/p R-CHOP x 6 cycles  PET/CT 08/06/2017: No evidence active lymphoma with minimal activity within the remaining periaortic and mesenteric lymph nodes - Deauville 2   3) Patient Active Problem List   Diagnosis Date Noted   Migraine 09/03/2017   Counseling regarding advanced care planning and goals of care 06/24/2017   Nodular lymphocyte predominant Hodgkin lymphoma of lymph nodes of multiple regions ([place]) 03/19/2017   Mesenteric lymphadenopathy 02/11/2017   Adenopathy 01/10/2017   Paraspinal mass 01/07/2017   Abnormal chest CT 01/07/2017   History of panic attacks    Bacterial pharyngitis 07/30/2016   Obesity, Class II, BMI 35-39.9, isolated 04/25/2015  -continue f/u with PCP for other chronic medical issues  4) Anxiety -ativan prn Continue f/u with BH  5) 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 -continue taking Vitamin B complex with meals to assist with this.     PLAN: -Discussed pt labwork today, 03/28/2021 CBC and CMP unremarkable.  LDH borderline elevated at 224 but this could be due to her recent URIs will monitor. -No lab or clinical evidence of lymphoma recurrence/progression at this time -Discussed maintaining active lifestyle and focusing on weight loss. -Will see back in 6 months   FOLLOW UP: Return to  clinic with Dr. [name] with labs in 6 months   The total time spent in the appt was 20 minutes and more than 50% was on counseling and direct patient cares.  All of the patient's questions were answered with apparent satisfaction. The patient knows to call the clinic with any problems, questions or concerns.   Sullivan Lone MD [place] AAHIVMS [facility] Hematology/Oncology Physician [facility]

## 2021-05-24 ENCOUNTER — Telehealth: Payer: Self-pay | Admitting: Hematology

## 2021-05-24 NOTE — Telephone Encounter (Signed)
Scheduled per sch msg. Called and left msg  

## 2021-05-27 ENCOUNTER — Encounter (HOSPITAL_COMMUNITY): Payer: Self-pay | Admitting: *Deleted

## 2021-05-27 ENCOUNTER — Emergency Department (HOSPITAL_COMMUNITY)
Admission: EM | Admit: 2021-05-27 | Discharge: 2021-05-28 | Disposition: A | Discharge status: Home / Self Care | Payer: 59 | Attending: Emergency Medicine | Admitting: Emergency Medicine

## 2021-05-27 ENCOUNTER — Other Ambulatory Visit: Payer: Self-pay

## 2021-05-27 DIAGNOSIS — R630 Anorexia: Secondary | ICD-10-CM | POA: Insufficient documentation

## 2021-05-27 DIAGNOSIS — R1013 Epigastric pain: Secondary | ICD-10-CM | POA: Diagnosis not present

## 2021-05-27 DIAGNOSIS — Z5321 Procedure and treatment not carried out due to patient leaving prior to being seen by health care provider: Secondary | ICD-10-CM | POA: Insufficient documentation

## 2021-05-27 DIAGNOSIS — R110 Nausea: Secondary | ICD-10-CM | POA: Insufficient documentation

## 2021-05-27 LAB — CBC WITH DIFFERENTIAL/PLATELET
Abs Immature Granulocytes: 0.02 10*3/uL (ref 0.00–0.07)
Basophils Absolute: 0 10*3/uL (ref 0.0–0.1)
Basophils Relative: 0 %
Eosinophils Absolute: 0.1 10*3/uL (ref 0.0–0.5)
Eosinophils Relative: 2 %
HCT: 44.4 % (ref 36.0–46.0)
Hemoglobin: 13.6 g/dL (ref 12.0–15.0)
Immature Granulocytes: 0 %
Lymphocytes Relative: 20 %
Lymphs Abs: 1.3 10*3/uL (ref 0.7–4.0)
MCH: 25.5 pg — ABNORMAL LOW (ref 26.0–34.0)
MCHC: 30.6 g/dL (ref 30.0–36.0)
MCV: 83.1 fL (ref 80.0–100.0)
Monocytes Absolute: 0.6 10*3/uL (ref 0.1–1.0)
Monocytes Relative: 9 %
Neutro Abs: 4.4 10*3/uL (ref 1.7–7.7)
Neutrophils Relative %: 69 %
Platelets: 236 10*3/uL (ref 150–400)
RBC: 5.34 MIL/uL — ABNORMAL HIGH (ref 3.87–5.11)
RDW: 13.8 % (ref 11.5–15.5)
WBC: 6.4 10*3/uL (ref 4.0–10.5)
nRBC: 0 % (ref 0.0–0.2)

## 2021-05-27 LAB — COMPREHENSIVE METABOLIC PANEL
ALT: 15 U/L (ref 0–44)
AST: 20 U/L (ref 15–41)
Albumin: 4 g/dL (ref 3.5–5.0)
Alkaline Phosphatase: 55 U/L (ref 38–126)
Anion gap: 7 (ref 5–15)
BUN: 13 mg/dL (ref 6–20)
CO2: 28 mmol/L (ref 22–32)
Calcium: 9.3 mg/dL (ref 8.9–10.3)
Chloride: 103 mmol/L (ref 98–111)
Creatinine, Ser: 1.12 mg/dL — ABNORMAL HIGH (ref 0.44–1.00)
GFR, Estimated: >60 mL/min (ref >60)
Glucose, Bld: 109 mg/dL — ABNORMAL HIGH (ref 70–99)
Potassium: 3.9 mmol/L (ref 3.5–5.1)
Sodium: 138 mmol/L (ref 135–145)
Total Bilirubin: 0.4 mg/dL (ref 0.3–1.2)
Total Protein: 6.7 g/dL (ref 6.5–8.1)

## 2021-05-27 LAB — URINALYSIS, ROUTINE W REFLEX MICROSCOPIC
Bacteria, UA: NONE SEEN
Bilirubin Urine: NEGATIVE
Glucose, UA: NEGATIVE mg/dL
Ketones, ur: NEGATIVE mg/dL
Leukocytes,Ua: NEGATIVE
Nitrite: NEGATIVE
Protein, ur: NEGATIVE mg/dL
Specific Gravity, Urine: 1.016 (ref 1.005–1.030)
pH: 6 (ref 5.0–8.0)

## 2021-05-27 LAB — I-STAT BETA HCG BLOOD, ED (MC, WL, AP ONLY): I-stat hCG, quantitative: <5 m[IU]/mL (ref <5)

## 2021-05-27 LAB — LIPASE, BLOOD: Lipase: 30 U/L (ref 11–51)

## 2021-05-27 NOTE — ED Provider Notes (Signed)
Emergency Medicine Provider Triage Evaluation Note  Bonnie Larsen , a 40 y.o. female  was evaluated in triage.  Pt complains of abdominal pain since Sunday.  She says it started in her epigastric region and is now radiating to her left lower quadrant.  She has had decreased appetite and nausea.  No vomiting.  She was seen by her OB/GYN because she has a history of fibroids.  She had ultrasound done at that time that did not show any changes of her ovaries or uterus.  She does have a history of lymphoma is remote.  She is not on her period.  Denies dysuria or hematuria.  She has no abnormal vaginal discharge.  Denies any fevers or chills.  Denies melena.  Is not an alcohol drinker.  Denies any other comorbidities.  Review of Systems  Positive: See above Negative:   Physical Exam  BP 137/88 (BP Location: Left Arm)   Pulse (!) 108   Temp 98.2 F (36.8 C) (Oral)   Resp 18   Ht 5\' 3"  (1.6 m)   Wt 117 kg   SpO2 99%   BMI 45.70 kg/m  Gen:   Awake, no distress   Resp:  Normal effort  MSK:   Moves extremities without difficulty  Other:  Abdomen is soft but moderate to severe generalized tenderness in all quadrants.  Medical Decision Making  Medically screening exam initiated at 9:40 PM.  Appropriate orders placed.  ETHIE CURLESS was informed that the remainder of the evaluation will be completed by another provider, this initial triage assessment does not replace that evaluation, and the importance of remaining in the ED until their evaluation is complete.    Sheila Oats 05/27/21 2143    Carmin Muskrat, MD 05/28/21 1115

## 2021-05-27 NOTE — ED Triage Notes (Signed)
The pt is c/o abd pain since Monday lt abd pain  lmp implant

## 2021-05-28 NOTE — ED Notes (Signed)
Pt stated that she has been here for a long time and cannot wait any longer. Pt decided to leave and follow up with her primary care.

## 2021-05-29 ENCOUNTER — Other Ambulatory Visit: Payer: Self-pay | Admitting: *Deleted

## 2021-05-29 DIAGNOSIS — C8108 Nodular lymphocyte predominant Hodgkin lymphoma, lymph nodes of multiple sites: Secondary | ICD-10-CM

## 2021-05-29 DIAGNOSIS — R61 Generalized hyperhidrosis: Secondary | ICD-10-CM

## 2021-05-30 ENCOUNTER — Inpatient Hospital Stay: Payer: 59

## 2021-05-30 ENCOUNTER — Other Ambulatory Visit: Payer: Self-pay

## 2021-05-30 ENCOUNTER — Inpatient Hospital Stay: Payer: 59 | Attending: Hematology | Admitting: Hematology

## 2021-05-30 VITALS — BP 129/88 | HR 106 | Temp 97.9°F | Resp 17 | Ht 63.0 in | Wt 259.7 lb

## 2021-05-30 DIAGNOSIS — Z79899 Other long term (current) drug therapy: Secondary | ICD-10-CM | POA: Insufficient documentation

## 2021-05-30 DIAGNOSIS — Z8269 Family history of other diseases of the musculoskeletal system and connective tissue: Secondary | ICD-10-CM | POA: Diagnosis not present

## 2021-05-30 DIAGNOSIS — C8108 Nodular lymphocyte predominant Hodgkin lymphoma, lymph nodes of multiple sites: Secondary | ICD-10-CM | POA: Diagnosis not present

## 2021-05-30 DIAGNOSIS — Z8249 Family history of ischemic heart disease and other diseases of the circulatory system: Secondary | ICD-10-CM | POA: Diagnosis not present

## 2021-05-30 DIAGNOSIS — F419 Anxiety disorder, unspecified: Secondary | ICD-10-CM | POA: Diagnosis not present

## 2021-05-30 DIAGNOSIS — Z833 Family history of diabetes mellitus: Secondary | ICD-10-CM | POA: Diagnosis not present

## 2021-05-30 DIAGNOSIS — Z806 Family history of leukemia: Secondary | ICD-10-CM | POA: Diagnosis not present

## 2021-05-30 DIAGNOSIS — Z8379 Family history of other diseases of the digestive system: Secondary | ICD-10-CM | POA: Diagnosis not present

## 2021-05-30 DIAGNOSIS — R109 Unspecified abdominal pain: Secondary | ICD-10-CM | POA: Diagnosis not present

## 2021-05-30 DIAGNOSIS — Z803 Family history of malignant neoplasm of breast: Secondary | ICD-10-CM | POA: Insufficient documentation

## 2021-05-30 DIAGNOSIS — G629 Polyneuropathy, unspecified: Secondary | ICD-10-CM | POA: Diagnosis not present

## 2021-05-30 DIAGNOSIS — R61 Generalized hyperhidrosis: Secondary | ICD-10-CM

## 2021-05-30 LAB — CBC WITH DIFFERENTIAL (CANCER CENTER ONLY)
Abs Immature Granulocytes: 0.01 10*3/uL (ref 0.00–0.07)
Basophils Absolute: 0 10*3/uL (ref 0.0–0.1)
Basophils Relative: 0 %
Eosinophils Absolute: 0.1 10*3/uL (ref 0.0–0.5)
Eosinophils Relative: 2 %
HCT: 43 % (ref 36.0–46.0)
Hemoglobin: 13.7 g/dL (ref 12.0–15.0)
Immature Granulocytes: 0 %
Lymphocytes Relative: 23 %
Lymphs Abs: 1.2 10*3/uL (ref 0.7–4.0)
MCH: 25.9 pg — ABNORMAL LOW (ref 26.0–34.0)
MCHC: 31.9 g/dL (ref 30.0–36.0)
MCV: 81.3 fL (ref 80.0–100.0)
Monocytes Absolute: 0.4 10*3/uL (ref 0.1–1.0)
Monocytes Relative: 7 %
Neutro Abs: 3.6 10*3/uL (ref 1.7–7.7)
Neutrophils Relative %: 68 %
Platelet Count: 232 10*3/uL (ref 150–400)
RBC: 5.29 MIL/uL — ABNORMAL HIGH (ref 3.87–5.11)
RDW: 13.7 % (ref 11.5–15.5)
WBC Count: 5.3 10*3/uL (ref 4.0–10.5)
nRBC: 0 % (ref 0.0–0.2)

## 2021-05-30 LAB — CMP (CANCER CENTER ONLY)
ALT: 11 U/L (ref 0–44)
AST: 17 U/L (ref 15–41)
Albumin: 4 g/dL (ref 3.5–5.0)
Alkaline Phosphatase: 56 U/L (ref 38–126)
Anion gap: 11 (ref 5–15)
BUN: 11 mg/dL (ref 6–20)
CO2: 25 mmol/L (ref 22–32)
Calcium: 9.4 mg/dL (ref 8.9–10.3)
Chloride: 105 mmol/L (ref 98–111)
Creatinine: 1.06 mg/dL — ABNORMAL HIGH (ref 0.44–1.00)
GFR, Estimated: >60 mL/min (ref >60)
Glucose, Bld: 129 mg/dL — ABNORMAL HIGH (ref 70–99)
Potassium: 4 mmol/L (ref 3.5–5.1)
Sodium: 141 mmol/L (ref 135–145)
Total Bilirubin: 0.4 mg/dL (ref 0.3–1.2)
Total Protein: 7.1 g/dL (ref 6.5–8.1)

## 2021-05-30 LAB — LACTATE DEHYDROGENASE: LDH: 143 U/L (ref 98–192)

## 2021-05-30 NOTE — Progress Notes (Signed)
HEMATOLOGY/ONCOLOGY CLINIC NOTE  Date of Service: .05/30/2021  Patient Care Team: Patient, No Pcp Per (Inactive) as PCP - General (General Practice)  CHIEF COMPLAINTS/PURPOSE OF CONSULTATION:  F/u for nodular lymphocyte predominant Hodgkin's lymphoma  HISTORY OF PRESENTING ILLNESS:   Please see previous note for details on initial presentation.  PREVIOUS THERAPY:    R-CHOP x 6 cycles (Fanale et al 2010)   INTERVAL HISTORY:  Bonnie Larsen is here for continued evaluation and management of her nodular lymphocyte predominant Hodgkin's lymphoma. Patient presented to the emergency room on 05/27/2021 with abdominal pain in the epigastric region and now over the left lower quadrant of the abdomen associated with nausea and decreased appetite.  No vomiting. No imaging was done in the emergency room. Patient notes no abnormal vaginal discharge. Patient notes no significant change in bowel habits. No fevers no chills no night sweats no unexpected weight loss.  Labs today 05/30/2021 show normal CBC with differential, CMP unremarkable and LDH within normal limits at 143.   MEDICAL HISTORY:  Past Medical History:  Diagnosis Date   Anxiety    Difficult intravenous access    Family history of adverse reaction to anesthesia    pt's mother has hx. of post-op N/V and hard to wake up post-op   History of panic attacks    02/08/17- has not had a panic attacks   Lymphoma ([place]) 01/2017   Mesenteric lymphadenopathy 01/2017   Morbid obesity with BMI of 40.0-44.9, adult ([place])    Runny nose 02/19/2017   clear drainage, per pt.   Seasonal allergies     SURGICAL HISTORY: Past Surgical History:  Procedure Laterality Date   LYMPH NODE BIOPSY N/A 02/11/2017   Procedure: HAND ASSISTED LAPAROSCOPIC LYMPH NODE BIOPSY;  Surgeon: Stark Klein, MD;  Location: [place];  Service: General;  Laterality: N/A;   PORT-A-CATH REMOVAL N/A 09/04/2017   Procedure: REMOVAL PORT-A-CATH;  Surgeon: Stark Klein, MD;   Location: [place];  Service: General;  Laterality: N/A;   PORTACATH PLACEMENT N/A 02/20/2017   Procedure: INSERTION PORT-A-CATH;  Surgeon: Stark Klein, MD;  Location: [place];  Service: General;  Laterality: N/A;   TUBAL LIGATION     UMBILICAL HERNIA REPAIR  02/11/2017    SOCIAL HISTORY: Social History   Socioeconomic History   Marital status: Married    Spouse name: Gwyndolyn Saxon   Number of children: 2   Years of education: Not on file   Highest education level: Associate degree: occupational, Hotel manager, or vocational program  Occupational History   Not on file  Tobacco Use   Smoking status: Never   Smokeless tobacco: Never  Vaping Use   Vaping Use: Never used  Substance and Sexual Activity   Alcohol use: No    Alcohol/week: 0.0 standard drinks   Drug use: No   Sexual activity: Yes    Partners: Male    Birth control/protection: I.U.D.    Comment: tubal ligation  Other Topics Concern   Not on file  Social History Narrative   Not on file   Social Determinants of Health   Financial Resource Strain: Not on file  Food Insecurity: Not on file  Transportation Needs: Not on file  Physical Activity: Not on file  Stress: Not on file  Social Connections: Not on file  Intimate Partner Violence: Not on file    FAMILY HISTORY: Family History  Problem Relation Age of Onset   Breast cancer Mother 60   Fibromyalgia Mother  Rheum arthritis Mother    Neuropathy Mother    Anesthesia problems Mother        post-op N/V; hard to wake up post-op   Congestive Heart Failure Father        died at age 5   Cirrhosis Father    Heart disease Maternal Grandmother    Diabetes Maternal Grandmother    Other Brother        Benign spinal tumor   Cancer Paternal Grandfather        Leukemia    ALLERGIES:  is allergic to clindamycin/lincomycin and cymbalta [duloxetine hcl].  MEDICATIONS:  Current Outpatient Medications  Medication Sig Dispense Refill   levonorgestrel  (MIRENA) 20 MCG/24HR IUD 1 each by Intrauterine route once.     Multiple Vitamin (MULTIVITAMIN) tablet Take 1 tablet by mouth daily.     Biotin 10 MG TABS Take by mouth.     No current facility-administered medications for this visit.   Facility-Administered Medications Ordered in Other Visits  Medication Dose Route Frequency Provider Last Rate Last Admin   sodium chloride flush (NS) 0.9 % injection 10 mL  10 mL Intravenous PRN Brunetta Genera, MD   10 mL at 04/16/17 1938    REVIEW OF SYSTEMS:  .10 Point review of Systems was done is negative except as noted above.    PHYSICAL EXAMINATION:  ECOG FS:0 - Asymptomatic  Vitals:   05/30/21 1303  BP: 129/88  Pulse: (!) 106  Resp: 17  Temp: 97.9 F (36.6 C)  SpO2: 97%   Wt Readings from Last 3 Encounters:  05/30/21 259 lb 11.2 oz (117.8 kg)  05/27/21 258 lb (117 kg)  03/28/21 256 lb 12.8 oz (116.5 kg)   Body mass index is 46 kg/m.     ECOG FS:0 - Asymptomatic  Vitals:   05/30/21 1303  BP: 129/88  Pulse: (!) 106  Resp: 17  Temp: 97.9 F (36.6 C)  SpO2: 97%   Wt Readings from Last 3 Encounters:  05/30/21 259 lb 11.2 oz (117.8 kg)  05/27/21 258 lb (117 kg)  03/28/21 256 lb 12.8 oz (116.5 kg)   Body mass index is 46 kg/m.   . . GENERAL:alert, in no acute distress and comfortable SKIN: no acute rashes, no significant lesions EYES: conjunctiva are pink and non-injected, sclera anicteric OROPHARYNX: MMM, no exudates, no oropharyngeal erythema or ulceration NECK: supple, no JVD LYMPH:  no palpable lymphadenopathy in the cervical, axillary or inguinal regions LUNGS: clear to auscultation b/l with normal respiratory effort HEART: regular rate & rhythm ABDOMEN:  normoactive bowel sounds , non tender, not distended.  Tenderness to palpation over the epigastric area and central abdomen.  No guarding rigidity or rebound  extremity: no pedal edema PSYCH: alert & oriented x 3 with fluent speech NEURO: no focal  motor/sensory deficits   LABORATORY DATA:  I have reviewed the data as listed  CBC Latest Ref Rng & Units 05/30/2021 05/27/2021 03/28/2021  WBC 4.0 - 10.5 K/uL 5.3 6.4 5.9  Hemoglobin 12.0 - 15.0 g/dL 13.7 13.6 14.0  Hematocrit 36.0 - 46.0 % 43.0 44.4 44.2  Platelets 150 - 400 K/uL 232 236 266     . CMP Latest Ref Rng & Units 05/27/2021 03/28/2021 09/27/2020  Glucose 70 - 99 mg/dL 109(H) 94 151(H)  BUN 6 - 20 mg/dL 13 14 9   Creatinine 0.44 - 1.00 mg/dL 1.12(H) 1.15(H) 1.06(H)  Sodium 135 - 145 mmol/L 138 144 143  Potassium 3.5 - 5.1 mmol/L 3.9  4.2 4.1  Chloride 98 - 111 mmol/L 103 107 105  CO2 22 - 32 mmol/L 28 26 24   Calcium 8.9 - 10.3 mg/dL 9.3 9.9 8.9  Total Protein 6.5 - 8.1 g/dL 6.7 7.4 6.7  Total Bilirubin 0.3 - 1.2 mg/dL 0.4 0.3 0.5  Alkaline Phos 38 - 126 U/L 55 58 54  AST 15 - 41 U/L 20 18 15   ALT 0 - 44 U/L 15 12 12    . Lab Results  Component Value Date   LDH 143 05/30/2021    Lymphnode biopsy, 02/11/2017 Diagnosis Lymph node for lymphoma, Mesenteric Lymphadenopathy - HODGKIN LYMPHOMA.         PROCEDURES  ECHO 03/13/17 Study Conclusions  - Left ventricle: The cavity size was normal. Systolic function was  normal. The estimated ejection fraction was in the range of 55%  to 60%. Wall motion was normal; there were no regional wall  motion abnormalities. Left ventricular diastolic function  parameters were normal. - Atrial septum: No defect or patent foramen ovale was identified. - Impressions: Normal GLS -20.2.   RADIOGRAPHIC STUDIES: I have personally reviewed the radiological images as listed and agreed with the findings in the report. No results found.    ASSESSMENT & PLAN:   40 y.o. 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 -PET/CT showed fairly active disease. -LDH level and sedimentation rate within normal limits . -HIV negative  -Hepatitis C negative.  -Baseline ECHO from 03/13/17 shows normal heart function, EF 55%-60%  Rpt PET/CT on 05/29/2017 - showed Significant reduction in size and metabolic activity of the formerly extensive adenopathy in the chest and abdomen, with primarily Deauville 3 activity today, and some Deauville 2 activity. Previously this was Deauville 5.  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 -continue taking Vitamin B complex with meals to assist with this.     PLAN: -Discussed patient's labs today.  Normal CBC, CMP and normal LDH -Patient is having significant abdominal pain which took her to the emergency room.  This is continuing.  We discussed and have ordered a CT of the abdomen and pelvis for further evaluation to rule out lymphoma recurrence or other abdominal pathology. -CT scan was ordered stat and has been scheduled for 06/09/2021 -Discussed maintaining active lifestyle and focusing on weight loss. -No lab evidence of lymphoma  progression at this time.  Will follow up on CT scan   FOLLOW UP: CT abd/pelvis stat today/tomorrow Phone visit with Dr [name] after CT   . The total time  spent in the appointment was 20 minutes and more than 50% was on counseling and direct patient cares.   All of the patient's questions were answered with apparent satisfaction. The patient knows to call the clinic with any problems, questions or concerns.   Sullivan Lone MD [place] AAHIVMS [facility] Hematology/Oncology Physician [facility]

## 2021-05-31 ENCOUNTER — Ambulatory Visit (INDEPENDENT_AMBULATORY_CARE_PROVIDER_SITE_OTHER): Payer: 59 | Admitting: Family

## 2021-05-31 ENCOUNTER — Encounter: Payer: Self-pay | Admitting: Family

## 2021-05-31 DIAGNOSIS — Z23 Encounter for immunization: Secondary | ICD-10-CM | POA: Diagnosis not present

## 2021-05-31 DIAGNOSIS — R1011 Right upper quadrant pain: Secondary | ICD-10-CM

## 2021-05-31 HISTORY — DX: Right upper quadrant pain: R10.11

## 2021-05-31 HISTORY — DX: Encounter for immunization: Z23

## 2021-05-31 MED ORDER — PANTOPRAZOLE SODIUM 20 MG PO TBEC: 20.0000 mg | DELAYED_RELEASE_TABLET | Freq: Every day | ORAL | 0 refills | Status: DC

## 2021-05-31 NOTE — Assessment & Plan Note (Signed)
Seen by GYN and had  T/V U/S negative, just small fibroids that pt has had long time. Pt has IUD. Pain continued and went to ER, no UTI, had to leave due to long wait. Also seen by oncology due to hx of Lymphoma w/hx of abdominal tumor, blood work neg, waiting on CT scan. Reports occ. Diarrhea, no constipation. Can't remember if r/t eating, but does feel full quickly, denies greasy foods, as she has been steadily losing weight and trying to eat healthier. A1C 6.1 at GYN office.

## 2021-05-31 NOTE — Patient Instructions (Signed)
Welcome to [facility]! It was a pleasure meeting you today.  As discussed, I have sent generic Protonix to your pharmacy, you can start this today and see if helps your symptoms. Let me know if having trouble getting your CT scan scheduled, or be sure to let me know the results after your oncologist reviews.  Check with your insurance company to see if weight loss management is covered and these meds: Saxenda, Wegovy, Victoza, Ozempic, Trulicity, or [place]. If not under weight loss, ask if covered under Type 2 Diabetes. If yes, then go ahead and schedule an office visit to discuss.  PLEASE NOTE:  If you had any LAB tests please let us know if you have not heard back within a few days. You may see your results on MyChart before we have a chance to review them but we will give you a call once they are reviewed by [place]. If we ordered any REFERRALS today, please let us know if you have not heard from their office within the next week.  Let us know through MyChart if you are needing REFILLS, or have your pharmacy send [place] the request. You can also use MyChart to communicate with me or any office staff.  Please try these tips to maintain a healthy lifestyle:  Eat most of your calories during the day when you are active. Eliminate processed foods including packaged sweets (pies, cakes, cookies), reduce intake of potatoes, white bread, white pasta, and white rice. Look for whole grain options, oat flour or almond flour.  Each meal should contain half fruits/vegetables, one quarter protein, and one quarter carbs (no bigger than a computer mouse).  Cut down on sweet beverages. This includes juice, soda, and sweet tea. Also watch fruit intake, though this is a healthier sweet option, it still contains natural sugar! Limit to 3 servings daily.  Drink at least 1 glass of water with each meal and aim for at least 8 glasses per day  Exercise at least 150 minutes every week.

## 2021-05-31 NOTE — Assessment & Plan Note (Signed)
A1C 6.1 at last GYN appt, told she was borderline T2DM and to lose weight. Pt reports she has steadily been losing by cutting calories and exercising. Advised pt to call her ins. And see if any GLP-1 meds are covered and if yes, schedule an office visit to get started. Wt. Loss strategies reviewed including portion control, less carbs including sweets, eating most of calories earlier in day, drinking 64oz water qd, and establishing daily exercise routine.

## 2021-05-31 NOTE — Assessment & Plan Note (Deleted)
Seen by GYN and had  T/V U/S negative, just small fibroids that pt has had long time. Pt has IUD. Pain continued and went to ER, no UTI, had to leave due to long wait. Also seen by oncology due to hx of Lymphoma w/hx of abdominal tumor, blood work neg, waiting on CT scan. Reports occ. Diarrhea, no constipation. Can't remember if r/t eating, but does feel full quickly. A1C 6.1 at GYN office.

## 2021-05-31 NOTE — Progress Notes (Signed)
New Patient Office Visit  Subjective:  Patient ID: Bonnie Larsen, female    DOB: [birth year]  Age: 40 y.o. MRN: [id number]  CC:  Chief Complaint  Patient presents with   Abdominal Pain    Started 2 weeks ago. She says that it is a constant with shooting pain that comes and goes. She has taken Tums, Gas X, and Tylenol. But symptoms have not subsided.   Back Pain    Along with shooting pain.   Flu Vaccine    Requesting    HPI Bonnie Larsen presents for establishing care and to discuss 1 new problem. Abdominal Pain: Patient complains of abdominal pain. The pain is described as aching, dull, sharp, and stabbing.  Pain is located in the RUQ, epigastric with radiation to her back.  Onset was 2 weeks ago. Symptoms have been unchanged since. Aggravating factors: eating (sometimes).  Alleviating factors: antacids and belching. Associated symptoms: diarrhea and nausea. The patient denies constipation, fever, and flatus. Obesity: Patient complains of obesity. Patient cites health, increased physical ability, self-image as reasons for wanting to lose weight. Highest adult weight: 270lbs. Greatest amount of weight lost: 20 lb over 3 months. Circumstances associated with regain of weight: medical issues. Successful weight loss techniques attempted: prescription appetite suppressants: Phentermine . Unsuccessful weight loss techniques attempted:  none .  Current Exercise Habits walking a few days per week. Number of regular meals per day: 3. Number of snacking episodes per day: 2. Who prepares food? patient. Binge behavior?: no. Eating precipitated by stress? yes. Guilt feelings associated with eating? sometimes. Use of alcohol: average 0 drinks/week. Use of medications that may cause weight gain none. Psych History: none.  Past Medical History:  Diagnosis Date   Anemia    Anxiety    Depression    Difficult intravenous access    Family history of adverse reaction to anesthesia    pt's mother has hx.  of post-op N/V and hard to wake up post-op   History of panic attacks    02/08/17- has not had a panic attacks   Lymphoma ([place]) 01/2017   Mesenteric lymphadenopathy 01/2017   Morbid obesity with BMI of 40.0-44.9, adult ([place])    Runny nose 02/19/2017   clear drainage, per pt.   Seasonal allergies     Past Surgical History:  Procedure Laterality Date   LYMPH NODE BIOPSY N/A 02/11/2017   Procedure: HAND ASSISTED LAPAROSCOPIC LYMPH NODE BIOPSY;  Surgeon: Stark Klein, MD;  Location: [place];  Service: General;  Laterality: N/A;   PORT-A-CATH REMOVAL N/A 09/04/2017   Procedure: REMOVAL PORT-A-CATH;  Surgeon: Stark Klein, MD;  Location: [place];  Service: General;  Laterality: N/A;   PORTACATH PLACEMENT N/A 02/20/2017   Procedure: INSERTION PORT-A-CATH;  Surgeon: Stark Klein, MD;  Location: [facility];  Service: General;  Laterality: N/A;   TUBAL LIGATION     UMBILICAL HERNIA REPAIR  02/11/2017    Family History  Problem Relation Age of Onset   Cancer Mother    Arthritis Mother    Breast cancer Mother 61   Fibromyalgia Mother    Rheum arthritis Mother    Neuropathy Mother    Anesthesia problems Mother        post-op N/V; hard to wake up post-op   Early death Father    Diabetes Father    COPD Father    Alcohol abuse Father    Congestive Heart Failure Father        died  at age 24   Cirrhosis Father    Other Brother        Benign spinal tumor   Heart disease Maternal Grandmother    Diabetes Maternal Grandmother    Cancer Paternal Grandfather        Leukemia    Social History   Socioeconomic History   Marital status: Married    Spouse name: Gwyndolyn Saxon   Number of children: 2   Years of education: Not on file   Highest education level: Associate degree: occupational, Hotel manager, or vocational program  Occupational History   Not on file  Tobacco Use   Smoking status: Never   Smokeless tobacco: Never  Vaping Use   Vaping Use: Never used  Substance and Sexual  Activity   Alcohol use: No    Alcohol/week: 0.0 standard drinks   Drug use: No   Sexual activity: Yes    Partners: Male    Birth control/protection: I.U.D.    Comment: tubal ligation  Other Topics Concern   Not on file  Social History Narrative   Not on file   Social Determinants of Health   Financial Resource Strain: Not on file  Food Insecurity: Not on file  Transportation Needs: Not on file  Physical Activity: Not on file  Stress: Not on file  Social Connections: Not on file  Intimate Partner Violence: Not on file    Objective:   Today's Vitals: BP 123/86   Pulse 96   Temp 98.4 F (36.9 C) (Temporal)   Ht 5\' 3"  (1.6 m)   Wt 259 lb (117.5 kg)   SpO2 100%   BMI 45.88 kg/m   Physical Exam Vitals and nursing note reviewed.  Constitutional:      Appearance: Normal appearance. She is morbidly obese.  Cardiovascular:     Rate and Rhythm: Normal rate and regular rhythm.  Pulmonary:     Effort: Pulmonary effort is normal.     Breath sounds: Normal breath sounds.  Musculoskeletal:        General: Normal range of motion.  Skin:    General: Skin is warm and dry.  Neurological:     Mental Status: She is alert.  Psychiatric:        Mood and Affect: Mood normal.        Behavior: Behavior normal.   Assessment & Plan:   Problem List Items Addressed This Visit       Other   Morbid obesity ([place]) - Primary    A1C 6.1 at last GYN appt, told she was borderline T2DM and to lose weight. Pt reports she has steadily been losing by cutting calories and exercising. Advised pt to call her ins. And see if any GLP-1 meds are covered and if yes, schedule an office visit to get started. Wt. Loss strategies reviewed including portion control, less carbs including sweets, eating most of calories earlier in day, drinking 64oz water qd, and establishing daily exercise routine.      Right upper quadrant abdominal pain    Seen by GYN and had  T/V U/S negative, just small fibroids that  pt has had long time. Pt has IUD. Pain continued and went to ER, no UTI, had to leave due to long wait. Also seen by oncology due to hx of Lymphoma w/hx of abdominal tumor, blood work neg, waiting on CT scan. Reports occ. Diarrhea, no constipation. Can't remember if r/t eating, but does feel full quickly, denies greasy foods, as she has been steadily  losing weight and trying to eat healthier. A1C 6.1 at GYN office.       Relevant Medications   pantoprazole (PROTONIX) 20 MG tablet   Other Visit Diagnoses     Need for immunization against influenza       Relevant Orders   Flu Vaccine QUAD 30mo+IM (Fluarix, Fluzone & Alfiuria Quad PF) (Completed)       Outpatient Encounter Medications as of 05/31/2021  Medication Sig   fluticasone (FLONASE) 50 MCG/ACT nasal spray Place 2 sprays into both nostrils daily.   levonorgestrel (MIRENA) 20 MCG/24HR IUD 1 each by Intrauterine route once.   LORazepam (ATIVAN PO) Take by mouth.   Multiple Vitamin (MULTIVITAMIN) tablet Take 1 tablet by mouth daily.   pantoprazole (PROTONIX) 20 MG tablet Take 1 tablet (20 mg total) by mouth daily. Take 1 pill twice a day for 1 week, then 1 pill every morning.   Vitamin D, Ergocalciferol, (DRISDOL) 1.25 MG (50000 UNIT) CAPS capsule Take 50,000 Units by mouth once a week.   Biotin 10 MG TABS Take by mouth. (Patient not taking: Reported on 05/31/2021)   Facility-Administered Encounter Medications as of 05/31/2021  Medication   sodium chloride flush (NS) 0.9 % injection 10 mL    Follow-up: Return for as needed, call back for weight loss management if covered by ins.   Jeanie Sewer, NP

## 2021-06-05 ENCOUNTER — Encounter: Payer: Self-pay | Admitting: Hematology

## 2021-06-08 ENCOUNTER — Other Ambulatory Visit: Payer: Self-pay | Admitting: Family

## 2021-06-08 DIAGNOSIS — E119 Type 2 diabetes mellitus without complications: Secondary | ICD-10-CM | POA: Insufficient documentation

## 2021-06-08 DIAGNOSIS — R7303 Prediabetes: Secondary | ICD-10-CM | POA: Insufficient documentation

## 2021-06-08 DIAGNOSIS — E10A2 Type 1 diabetes mellitus, presymptomatic, stage 2: Secondary | ICD-10-CM | POA: Insufficient documentation

## 2021-06-08 MED ORDER — METFORMIN HCL ER 500 MG PO TB24: ORAL_TABLET | ORAL | 0 refills | Status: DC

## 2021-06-08 MED ORDER — "NEEDLE (DISP) 30G X 1/2"" MISC": 1.0000 | Freq: Every day | 0 refills | Status: DC

## 2021-06-08 MED ORDER — LIRAGLUTIDE 18 MG/3ML ~~LOC~~ SOPN: PEN_INJECTOR | SUBCUTANEOUS | 0 refills | Status: DC

## 2021-06-09 ENCOUNTER — Other Ambulatory Visit: Payer: Self-pay

## 2021-06-09 ENCOUNTER — Ambulatory Visit (HOSPITAL_BASED_OUTPATIENT_CLINIC_OR_DEPARTMENT_OTHER)
Admission: RE | Admit: 2021-06-09 | Discharge: 2021-06-09 | Disposition: A | Discharge status: Home / Self Care | Payer: 59 | Source: Ambulatory Visit | Attending: Hematology | Admitting: Hematology

## 2021-06-09 DIAGNOSIS — C8108 Nodular lymphocyte predominant Hodgkin lymphoma, lymph nodes of multiple sites: Secondary | ICD-10-CM | POA: Diagnosis not present

## 2021-06-09 DIAGNOSIS — R109 Unspecified abdominal pain: Secondary | ICD-10-CM | POA: Insufficient documentation

## 2021-06-09 MED ORDER — IOHEXOL 300 MG/ML  SOLN
100.0000 mL | Freq: Once | INTRAMUSCULAR | Status: AC | PRN
  Administered 2021-06-09: 100 mL via INTRAVENOUS

## 2021-06-14 ENCOUNTER — Telehealth: Payer: Self-pay

## 2021-06-14 ENCOUNTER — Ambulatory Visit: Payer: 59 | Admitting: Family

## 2021-06-14 NOTE — Telephone Encounter (Signed)
Cover My Meds:  [place]#[id number]  Prior Auth determination for Victoza 18MG /3ML Sent to [facility] 06/14/2021.  Pending response.

## 2021-06-21 ENCOUNTER — Ambulatory Visit (INDEPENDENT_AMBULATORY_CARE_PROVIDER_SITE_OTHER): Payer: 59 | Admitting: Primary Care

## 2021-06-23 ENCOUNTER — Inpatient Hospital Stay (HOSPITAL_BASED_OUTPATIENT_CLINIC_OR_DEPARTMENT_OTHER): Payer: 59 | Admitting: Hematology

## 2021-06-23 DIAGNOSIS — C8108 Nodular lymphocyte predominant Hodgkin lymphoma, lymph nodes of multiple sites: Secondary | ICD-10-CM

## 2021-06-27 ENCOUNTER — Telehealth: Payer: Self-pay | Admitting: Hematology

## 2021-06-27 NOTE — Telephone Encounter (Signed)
Scheduled follow-up appointment per 12/30 los. Patient is aware. °

## 2021-06-29 ENCOUNTER — Encounter: Payer: Self-pay | Admitting: Hematology

## 2021-06-29 NOTE — Progress Notes (Addendum)
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  PREVIOUS THERAPY:    R-CHOP x 6 cycles (Fanale et al 2010)   INTERVAL HISTORY:  .I connected with Bonnie Larsen on 07/02/21 at  1:00 PM EST by telephone visit and verified that I am speaking with the correct person using two identifiers.   I discussed the limitations, risks, security and privacy concerns of performing an evaluation and management service by telemedicine and the availability of in-person appointments. I also discussed with the patient that there may be a patient responsible charge related to this service. The patient expressed understanding and agreed to proceed.   Other persons participating in the visit and their role in the encounter: None  Patients location: Home Providers location: [place]  Chief Complaint: Review of CT scan of the abdomen due to abdominal pain in the context of history of Hodgkin's lymphoma.  Patient notes no acute new symptoms since her last clinic visit.  She notes that she has been started on PPI therapy for acid reflux by her primary care physician and this has helped her upper abdominal discomfort. We discussed her CT of the abdomen done on 06/09/2021 which shows no overt significant progression of her nodular lymphocyte predominant Hodgkin's lymphoma. No other acute new symptoms.   MEDICAL HISTORY:  Past Medical History:  Diagnosis Date   Anemia    Anxiety    Depression    Difficult intravenous access    Family history of adverse reaction to anesthesia    pt's mother has hx. of post-op N/V and hard to wake up post-op   History of panic  attacks    02/08/17- has not had a panic attacks   Lymphoma ([place]) 01/2017   Mesenteric lymphadenopathy 01/2017   Morbid obesity with BMI of 40.0-44.9, adult ([place])    Runny nose 02/19/2017   clear drainage, per pt.   Seasonal allergies     SURGICAL HISTORY: Past Surgical History:  Procedure Laterality Date   LYMPH NODE BIOPSY N/A 02/11/2017   Procedure: HAND ASSISTED LAPAROSCOPIC LYMPH NODE BIOPSY;  Surgeon: Stark Klein, MD;  Location: [place];  Service: General;  Laterality: N/A;   PORT-A-CATH REMOVAL N/A 09/04/2017   Procedure: REMOVAL PORT-A-CATH;  Surgeon: Stark Klein, MD;  Location: [facility];  Service: General;  Laterality: N/A;   PORTACATH PLACEMENT N/A 02/20/2017   Procedure: INSERTION PORT-A-CATH;  Surgeon: Stark Klein, MD;  Location: [facility];  Service: General;  Laterality: N/A;   TUBAL LIGATION     UMBILICAL HERNIA REPAIR  02/11/2017    SOCIAL HISTORY: Social History   Socioeconomic History   Marital status: Married    Spouse name: Gwyndolyn Saxon   Number of children: 2   Years of education: Not on file   Highest education level: Associate degree: occupational, Hotel manager, or vocational program  Occupational History   Not on file  Tobacco Use   Smoking status: Never   Smokeless tobacco: Never  Vaping Use   Vaping Use: Never used  Substance and Sexual Activity   Alcohol use: No    Alcohol/week: 0.0 standard drinks   Drug use: No   Sexual activity: Yes  Partners: Male    Birth control/protection: I.U.D.    Comment: tubal ligation  Other Topics Concern   Not on file  Social History Narrative   Not on file   Social Determinants of Health   Financial Resource Strain: Not on file  Food Insecurity: Not on file  Transportation Needs: Not on file  Physical Activity: Not on file  Stress: Not on file  Social Connections: Not on file  Intimate Partner Violence: Not on file    FAMILY HISTORY: Family History  Problem Relation Age of Onset   Cancer  Mother    Arthritis Mother    Breast cancer Mother 33   Fibromyalgia Mother    Rheum arthritis Mother    Neuropathy Mother    Anesthesia problems Mother        post-op N/V; hard to wake up post-op   Early death Father    Diabetes Father    COPD Father    Alcohol abuse Father    Congestive Heart Failure Father        died at age 24   Cirrhosis Father    Other Brother        Benign spinal tumor   Heart disease Maternal Grandmother    Diabetes Maternal Grandmother    Cancer Paternal Grandfather        Leukemia    ALLERGIES:  is allergic to clindamycin/lincomycin and cymbalta [duloxetine hcl].  MEDICATIONS:  Current Outpatient Medications  Medication Sig Dispense Refill   fluticasone (FLONASE) 50 MCG/ACT nasal spray Place 2 sprays into both nostrils daily.     levonorgestrel (MIRENA) 20 MCG/24HR IUD 1 each by Intrauterine route once.     liraglutide (VICTOZA) 18 MG/3ML SOPN Start 0.6 mg [place] qd x 1 week, then 1.2 mg [place] qd thereafter if no nausea 6 mL 0   LORazepam (ATIVAN PO) Take by mouth.     metFORMIN (GLUCOPHAGE XR) 500 MG 24 hr tablet Start 500mg  PO qpm. 30 tablet 0   Multiple Vitamin (MULTIVITAMIN) tablet Take 1 tablet by mouth daily.     NEEDLE, DISP, 30 G 30G X 1/2" MISC Inject 1 each as directed daily. 50 each 0   pantoprazole (PROTONIX) 20 MG tablet Take 1 tablet (20 mg total) by mouth daily. Take 1 pill twice a day for 1 week, then 1 pill every morning. 30 tablet 0   Vitamin D, Ergocalciferol, (DRISDOL) 1.25 MG (50000 UNIT) CAPS capsule Take 50,000 Units by mouth once a week.     No current facility-administered medications for this visit.   Facility-Administered Medications Ordered in Other Visits  Medication Dose Route Frequency Provider Last Rate Last Admin   sodium chloride flush (NS) 0.9 % injection 10 mL  10 mL Intravenous PRN Brunetta Genera, MD   10 mL at 04/16/17 1938    REVIEW OF SYSTEMS:  negative except as noted above.    PHYSICAL EXAMINATION:   Telemedicine visit  LABORATORY DATA:  I have reviewed the data as listed  CBC Latest Ref Rng & Units 05/30/2021 05/27/2021 03/28/2021  WBC 4.0 - 10.5 K/uL 5.3 6.4 5.9  Hemoglobin 12.0 - 15.0 g/dL 13.7 13.6 14.0  Hematocrit 36.0 - 46.0 % 43.0 44.4 44.2  Platelets 150 - 400 K/uL 232 236 266   CMP Latest Ref Rng & Units 05/30/2021 05/27/2021 03/28/2021  Glucose 70 - 99 mg/dL 129(H) 109(H) 94  BUN 6 - 20 mg/dL 11 13 14   Creatinine 0.44 - 1.00 mg/dL 1.06(H) 1.12(H) 1.15(H)  Sodium 135 - 145 mmol/L 141 138 144  Potassium 3.5 - 5.1 mmol/L 4.0 3.9 4.2  Chloride 98 - 111 mmol/L 105 103 107  CO2 22 - 32 mmol/L 25 28 26   Calcium 8.9 - 10.3 mg/dL 9.4 9.3 9.9  Total Protein 6.5 - 8.1 g/dL 7.1 6.7 7.4  Total Bilirubin 0.3 - 1.2 mg/dL 0.4 0.4 0.3  Alkaline Phos 38 - 126 U/L 56 55 58  AST 15 - 41 U/L 17 20 18   ALT 0 - 44 U/L 11 15 12    . Lab Results  Component Value Date   LDH 143 05/30/2021    Lymphnode biopsy, 02/11/2017 Diagnosis Lymph node for lymphoma, Mesenteric Lymphadenopathy - HODGKIN LYMPHOMA.         PROCEDURES  ECHO 03/13/17 Study Conclusions  - Left ventricle: The cavity size was normal. Systolic function was  normal. The estimated ejection fraction was in the range of 55%  to 60%. Wall motion was normal; there were no regional wall  motion abnormalities. Left ventricular diastolic function  parameters were normal. - Atrial septum: No defect or patent foramen ovale was identified. - Impressions: Normal GLS -20.2.   RADIOGRAPHIC STUDIES: I have personally reviewed the radiological images as listed and agreed with the findings in the report. CT Abdomen Pelvis W Contrast  Result Date: 06/09/2021 CLINICAL DATA:  Abdominal pain greater in RIGHT upper quadrant and epigastrium, varies aching, dull, sharp, and stabbing, onset 2 weeks ago; history of nodular lymphocyte-predominant Hodgkin's lymphoma, mesenteric adenopathy EXAM: CT ABDOMEN AND PELVIS WITH CONTRAST TECHNIQUE:  Multidetector CT imaging of the abdomen and pelvis was performed using the standard protocol following bolus administration of intravenous contrast. CONTRAST:  128mL OMNIPAQUE IOHEXOL 300 MG/ML SOLN IV. Dilute oral contrast. COMPARISON:  05/24/2020 FINDINGS: Lower chest: Lung bases clear Hepatobiliary: Gallbladder and liver normal appearance Pancreas: Normal appearance Spleen: Normal size and appearance Adrenals/Urinary Tract: Adrenal glands normal appearance. LEFT kidney normal appearance. 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 No ureteral calcification or dilatation. Bladder unremarkable. Stomach/Bowel: Normal appendix. Stomach and bowel loops normal appearance Vascular/Lymphatic: Vascular structures patent. Multiple though normal sized para-aortic lymph nodes identified. Multiple normal sized and enlarged mesenteric lymph nodes up to 1.7 cm diameter previously 1.6 cm. Few normal sized aortocaval and peripancreatic nodes. No pelvic adenopathy. Reproductive: IUD within uterus. Uterus and ovaries otherwise normal appearance Other: Umbilical hernia containing fat. No free air or free fluid. No inflammatory process. Musculoskeletal: No acute osseous findings. IMPRESSION: Multiple normal sized and enlarged mesenteric lymph nodes up to 1.7 cm diameter, previously 1.6 cm, consistent with history of Hodgkin's disease. Umbilical hernia containing fat. No acute intra-abdominal or intrapelvic abnormalities. Electronically Signed   By: Lavonia Dana M.D.   On: 06/09/2021 16:25      ASSESSMENT & PLAN:   41 y.o. 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 Previously this was Deauville 5.  S/p R-CHOP x 6 cycles  PET/CT 08/06/2017: No evidence active lymphoma with minimal activity within the remaining periaortic and mesenteric lymph nodes - Deauville 2  2) Epigastric abdominal discomfort Significantly improved with PPI therapy. PLAN -Recent labs done on 06/15/2021 showed normal LDH and unremarkable CBC and  CMP. -CT of the abdomen was done in the context of the patient abdominal discomfort and shows no significant evidence of lymphoma progression that would account for the patient's symptoms. -Indication for additional treatment of the patient's history of nodular lymphocyte predominant Hodgkin's lymphoma at this time. -Continue follow-up with primary care physician for PPI therapy and to consider GI consultation if abdominal discomfort persists.  There might need to be consideration for EGD if symptoms do not resolve.  Will defer need for GI referral to her primary care physician.  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  All of the patient's questions were answered with apparent satisfaction. The  patient knows to call the clinic with any problems, questions or concerns.   Sullivan Lone MD MS AAHIVMS [facility] Hematology/Oncology Physician [facility]      .

## 2021-07-02 NOTE — Addendum Note (Signed)
Addended by: Sullivan Lone on: 07/02/2021 02:23 PM   Modules accepted: Level of Service

## 2021-07-04 ENCOUNTER — Other Ambulatory Visit: Payer: Self-pay | Admitting: Family

## 2021-07-04 DIAGNOSIS — R1011 Right upper quadrant pain: Secondary | ICD-10-CM

## 2021-07-04 DIAGNOSIS — E119 Type 2 diabetes mellitus without complications: Secondary | ICD-10-CM

## 2021-07-04 MED ORDER — METFORMIN HCL ER 500 MG PO TB24: ORAL_TABLET | ORAL | 0 refills | Status: DC

## 2021-07-04 MED ORDER — PANTOPRAZOLE SODIUM 20 MG PO TBEC: 20.0000 mg | DELAYED_RELEASE_TABLET | Freq: Every day | ORAL | 0 refills | Status: DC

## 2021-07-05 ENCOUNTER — Other Ambulatory Visit: Payer: Self-pay

## 2021-07-05 ENCOUNTER — Encounter: Payer: Self-pay | Admitting: Family

## 2021-07-05 ENCOUNTER — Ambulatory Visit (INDEPENDENT_AMBULATORY_CARE_PROVIDER_SITE_OTHER): Payer: 59 | Admitting: Family

## 2021-07-05 VITALS — BP 112/78 | HR 95 | Temp 97.8°F | Ht 63.0 in | Wt 255.6 lb

## 2021-07-05 DIAGNOSIS — E119 Type 2 diabetes mellitus without complications: Secondary | ICD-10-CM

## 2021-07-05 DIAGNOSIS — F411 Generalized anxiety disorder: Secondary | ICD-10-CM | POA: Diagnosis not present

## 2021-07-05 DIAGNOSIS — M7989 Other specified soft tissue disorders: Secondary | ICD-10-CM | POA: Diagnosis not present

## 2021-07-05 HISTORY — DX: Other specified soft tissue disorders: M79.89

## 2021-07-05 LAB — HEMOGLOBIN A1C: Hgb A1c MFr Bld: 5.9 % (ref 4.6–6.5)

## 2021-07-05 MED ORDER — HYDROXYZINE HCL 10 MG PO TABS: 10.0000 mg | ORAL_TABLET | Freq: Every evening | ORAL | 0 refills | Status: DC

## 2021-07-05 MED ORDER — HYDROCHLOROTHIAZIDE 12.5 MG PO TABS: 12.5000 mg | ORAL_TABLET | Freq: Every morning | ORAL | 0 refills | Status: DC

## 2021-07-05 MED ORDER — BUPROPION HCL 75 MG PO TABS: 75.0000 mg | ORAL_TABLET | Freq: Two times a day (BID) | ORAL | 0 refills | Status: DC

## 2021-07-05 NOTE — Assessment & Plan Note (Addendum)
bilateral feet and ankles, reports bothering her mostly in evenings and overnight. Advised on proper water hydration with low sodium diet, elevating feet during day when able. Starting low dose HCTZ, advised on use & SE. also recommend mild compression socks during day.

## 2021-07-05 NOTE — Progress Notes (Signed)
Subjective:     Patient ID: Bonnie Larsen, female    DOB: 1981-06-04, 41 y.o.   MRN: [id number]  Chief Complaint  Patient presents with   Anxiety    Pt was on Ativan, and seeing a Therapist. Therapy has worked, but needs PCP eval.    Joint Pain    Joint Cramps-started 2 months nightly.    Diabetes   HPI: T2DM: Pt is currently maintained on the following medications for diabetes:Metformin Failed meds include: none Denies polyuria/polydipsia. Denies hypoglycemia Home glucose readings range: not checking at home. Requesting A1C today.  Anxiety/Depression: Patient complains of anxiety symptoms.   She has the following symptoms: irritable, palpitations, racing thoughts, sweating.  Onset of symptoms was approximately  months ago, She denies current suicidal and homicidal ideation.  Possible organic causes contributing are: none.  Risk factors: negative life event cancer   Previous treatment includes Ativan and individual therapy.  She complains of the following side effects from the treatment: none. Depression screen [facility] 2/9 07/05/2021  Decreased Interest 0  Down, Depressed, Hopeless 0  PHQ - 2 Score 0   GAD 7 : Generalized Anxiety Score 07/05/2021  Nervous, Anxious, on Edge 2  Control/stop worrying 2  Worry too much - different things 3  Trouble relaxing 2  Restless 1  Easily annoyed or irritable 2  Afraid - awful might happen 2  Total GAD 7 Score 14  Anxiety Difficulty Very difficult   Pain She reports new onset leg and foot cramp pain. There was not an injury that may have caused the pain. The pain started about a month ago and is staying constant. The pain does radiate from the knees down. The pain is described as aching, soreness, and stiffness, is moderate in intensity, occurring intermittently. Symptoms are worse in the: evening, nighttime  Aggravating factors: edema.  She has not tried any home remedies.    Health Maintenance Due  Topic Date Due   HEMOGLOBIN A1C   Never done   Pneumococcal Vaccine 77-69 Years old (1 - PCV) Never done   FOOT EXAM  Never done   OPHTHALMOLOGY EXAM  Never done   URINE MICROALBUMIN  Never done   PAP SMEAR-Modifier  07/26/2017   COVID-19 Vaccine (4 - Booster for Pfizer series) 03/29/2021    Past Medical History:  Diagnosis Date   Anemia    Anxiety    Depression    Difficult intravenous access    Family history of adverse reaction to anesthesia    pt's mother has hx. of post-op N/V and hard to wake up post-op   History of panic attacks    02/08/17- has not had a panic attacks   Lymphoma ([place]) 01/2017   Mesenteric lymphadenopathy 01/2017   Morbid obesity with BMI of 40.0-44.9, adult ([place])    Runny nose 02/19/2017   clear drainage, per pt.   Seasonal allergies     Past Surgical History:  Procedure Laterality Date   LYMPH NODE BIOPSY N/A 02/11/2017   Procedure: HAND ASSISTED LAPAROSCOPIC LYMPH NODE BIOPSY;  Surgeon: Stark Klein, MD;  Location: [place];  Service: General;  Laterality: N/A;   PORT-A-CATH REMOVAL N/A 09/04/2017   Procedure: REMOVAL PORT-A-CATH;  Surgeon: Stark Klein, MD;  Location: [place];  Service: General;  Laterality: N/A;   PORTACATH PLACEMENT N/A 02/20/2017   Procedure: INSERTION PORT-A-CATH;  Surgeon: Stark Klein, MD;  Location: [place];  Service: General;  Laterality: N/A;   TUBAL LIGATION  UMBILICAL HERNIA REPAIR  02/11/2017    Outpatient Medications Prior to Visit  Medication Sig Dispense Refill   fluticasone (FLONASE) 50 MCG/ACT nasal spray Place 2 sprays into both nostrils daily.     levonorgestrel (MIRENA) 20 MCG/24HR IUD 1 each by Intrauterine route once.     LORazepam (ATIVAN PO) Take by mouth.     metFORMIN (GLUCOPHAGE XR) 500 MG 24 hr tablet Start 500mg  PO qpm. 30 tablet 0   Multiple Vitamin (MULTIVITAMIN) tablet Take 1 tablet by mouth daily.     NEEDLE, DISP, 30 G 30G X 1/2" MISC Inject 1 each as directed daily. 50 each 0   pantoprazole (PROTONIX) 20 MG  tablet Take 1 tablet (20 mg total) by mouth daily. Take 1 pill twice a day for 1 week, then 1 pill every morning. 30 tablet 0   Vitamin D, Ergocalciferol, (DRISDOL) 1.25 MG (50000 UNIT) CAPS capsule Take 50,000 Units by mouth once a week.     liraglutide (VICTOZA) 18 MG/3ML SOPN Start 0.6 mg [place] qd x 1 week, then 1.2 mg [place] qd thereafter if no nausea (Patient not taking: Reported on 07/05/2021) 6 mL 0   Facility-Administered Medications Prior to Visit  Medication Dose Route Frequency Provider Last Rate Last Admin   sodium chloride flush (NS) 0.9 % injection 10 mL  10 mL Intravenous PRN Brunetta Genera, MD   10 mL at 04/16/17 1938    Allergies  Allergen Reactions   Clindamycin/Lincomycin Swelling    FACIAL SWELLING   Morphine    Cymbalta [Duloxetine Hcl] Nausea Only    Causes pt to have nausea and stomach cramping.        Objective:    Physical Exam Vitals and nursing note reviewed.  Constitutional:      Appearance: Normal appearance. She is obese.  Cardiovascular:     Rate and Rhythm: Normal rate and regular rhythm.  Pulmonary:     Effort: Pulmonary effort is normal.     Breath sounds: Normal breath sounds.  Musculoskeletal:        General: Normal range of motion.     Right lower leg: Swelling present. 2+ Edema present.     Left lower leg: Swelling present. 2+ Edema present.     Right ankle: Swelling present.     Left ankle: Swelling present.     Right foot: Swelling present.     Left foot: No swelling.  Skin:    General: Skin is warm and dry.  Neurological:     Mental Status: She is alert.  Psychiatric:        Mood and Affect: Mood normal.        Behavior: Behavior normal.    BP 112/78    Pulse 95    Temp 97.8 F (36.6 C) (Temporal)    Ht 5\' 3"  (1.6 m)    Wt 255 lb 9.6 oz (115.9 kg)    LMP  (LMP Unknown)    SpO2 98%    BMI 45.28 kg/m  Wt Readings from Last 3 Encounters:  07/05/21 255 lb 9.6 oz (115.9 kg)  05/31/21 259 lb (117.5 kg)  05/30/21 259 lb 11.2 oz  (117.8 kg)       Assessment & Plan:   Problem List Items Addressed This Visit       Endocrine   New onset type 2 diabetes mellitus ([place])    checking A1C today, pt reports her ins. requires this to approve the Victoza RX. has been taking  Metformin daily, some diarrhea at first, but doing better now.      Relevant Orders   HgB A1c     Other   Foot swelling    bilateral feet and ankles, reports bothering her mostly in evenings and overnight. Advised on proper water hydration with low sodium diet, elevating feet during day when able. Starting low dose HCTZ, advised on use & SE. also recommend mild compression socks during day.      Relevant Medications   hydrochlorothiazide (HYDRODIURIL) 12.5 MG tablet   Generalized anxiety disorder - Primary    reports having in past and has a therapist that she talks to, but lately it has been worse, more often. has taken Ativan in past prn. Advised this can be addicting and would prefer she try a safer med prn and a maintenance med. discussed all options, and she would like to try Wellbutrin w/Vistaril prn. Advised on use & SE. f/u in 1 month.      Relevant Medications   buPROPion (WELLBUTRIN) 75 MG tablet   hydrOXYzine (ATARAX) 10 MG tablet    Meds ordered this encounter  Medications   hydrochlorothiazide (HYDRODIURIL) 12.5 MG tablet    Sig: Take 1 tablet (12.5 mg total) by mouth in the morning.    Dispense:  30 tablet    Refill:  0    Order Specific Question:   Supervising Provider    Answer:   ANDY, CAMILLE L [[id number]]   buPROPion (WELLBUTRIN) 75 MG tablet    Sig: Take 1 tablet (75 mg total) by mouth 2 (two) times daily.    Dispense:  60 tablet    Refill:  0    Order Specific Question:   Supervising Provider    Answer:   ANDY, CAMILLE L [[id number]]   hydrOXYzine (ATARAX) 10 MG tablet    Sig: Take 1-2 tablets (10-20 mg total) by mouth every evening. OK to take up to 3 pills at bedtime if needed for sleep, maximum of 5 pills qd    Dispense:   30 tablet    Refill:  0    Order Specific Question:   Supervising Provider    Answer:   ANDY, CAMILLE L [[id number]]

## 2021-07-05 NOTE — Assessment & Plan Note (Signed)
reports having in past and has a therapist that she talks to, but lately it has been worse, more often. has taken Ativan in past prn. Advised this can be addicting and would prefer she try a safer med prn and a maintenance med. discussed all options, and she would like to try Wellbutrin w/Vistaril prn. Advised on use & SE. f/u in 1 month.

## 2021-07-05 NOTE — Assessment & Plan Note (Signed)
checking A1C today, pt reports her ins. requires this to approve the Victoza RX. has been taking Metformin daily, some diarrhea at first, but doing better now.

## 2021-07-05 NOTE — Patient Instructions (Addendum)
It was very nice to see you today!  Go to the lab for blood work.  I have sent the Bupropion to your pharmacy to help with anxiety. Start 1 pill daily, then add 2nd pill if needed and no side effects. I also have sent Hydroxyzine to take in the evenings.  I sent the fluid pill, HCTZ, to see if this will help with some of your leg & foot pain, but also sent a referral to podiatry and you can schedule an appointment when they call you.   For your migraines, continue taking the Excedrin migraine but also take 1 generic Aleve with it, and we can discuss this more next visit.     PLEASE NOTE:  If you had any lab tests please let us know if you have not heard back within a few days. You may see your results on MyChart before we have a chance to review them but we will give you a call once they are reviewed by [place]. If we ordered any referrals today, please let us know if you have not heard from their office within the next week.   Please try these tips to maintain a healthy lifestyle:  Eat most of your calories during the day when you are active. Eliminate processed foods including packaged sweets (pies, cakes, cookies), reduce intake of potatoes, white bread, white pasta, and white rice. Look for whole grain options, oat flour or almond flour.  Each meal should contain half fruits/vegetables, one quarter protein, and one quarter carbs (no bigger than a computer mouse).  Cut down on sweet beverages. This includes juice, soda, and sweet tea. Also watch fruit intake, though this is a healthier sweet option, it still contains natural sugar! Limit to 3 servings daily.  Drink at least 1 glass of water with each meal and aim for at least 8 glasses per day  Exercise at least 150 minutes every week.

## 2021-07-09 NOTE — Progress Notes (Signed)
A1C is borderline. pt states her insurance required a A1C # for her Victoza PA, please let them know if we started the PA, or let Polly[facility] know to tell them.  thanks.

## 2021-08-02 ENCOUNTER — Ambulatory Visit: Payer: 59 | Admitting: Family

## 2021-08-03 ENCOUNTER — Other Ambulatory Visit: Payer: Self-pay

## 2021-08-03 ENCOUNTER — Other Ambulatory Visit: Payer: Self-pay | Admitting: Family

## 2021-08-03 DIAGNOSIS — F411 Generalized anxiety disorder: Secondary | ICD-10-CM

## 2021-08-03 DIAGNOSIS — M7989 Other specified soft tissue disorders: Secondary | ICD-10-CM

## 2021-08-03 DIAGNOSIS — R1011 Right upper quadrant pain: Secondary | ICD-10-CM

## 2021-08-03 MED ORDER — PANTOPRAZOLE SODIUM 20 MG PO TBEC: 20.0000 mg | DELAYED_RELEASE_TABLET | Freq: Every day | ORAL | 1 refills | Status: DC

## 2021-08-03 MED ORDER — HYDROCHLOROTHIAZIDE 12.5 MG PO TABS: 12.5000 mg | ORAL_TABLET | Freq: Every morning | ORAL | 1 refills | Status: DC

## 2021-08-03 MED ORDER — BUPROPION HCL 75 MG PO TABS: 75.0000 mg | ORAL_TABLET | Freq: Two times a day (BID) | ORAL | 2 refills | Status: DC

## 2021-08-08 ENCOUNTER — Other Ambulatory Visit: Payer: Self-pay | Admitting: Family

## 2021-08-08 DIAGNOSIS — E119 Type 2 diabetes mellitus without complications: Secondary | ICD-10-CM

## 2021-08-08 MED ORDER — METFORMIN HCL ER 500 MG PO TB24: ORAL_TABLET | ORAL | 0 refills | Status: DC

## 2021-08-30 ENCOUNTER — Other Ambulatory Visit: Payer: Self-pay | Admitting: Family

## 2021-08-30 DIAGNOSIS — E119 Type 2 diabetes mellitus without complications: Secondary | ICD-10-CM

## 2021-08-30 MED ORDER — LIRAGLUTIDE 18 MG/3ML ~~LOC~~ SOPN: PEN_INJECTOR | SUBCUTANEOUS | 0 refills | Status: DC

## 2021-09-04 ENCOUNTER — Encounter: Payer: Self-pay | Admitting: Hematology

## 2021-09-04 ENCOUNTER — Other Ambulatory Visit: Payer: Self-pay | Admitting: Family

## 2021-09-04 DIAGNOSIS — E119 Type 2 diabetes mellitus without complications: Secondary | ICD-10-CM

## 2021-09-04 MED ORDER — METFORMIN HCL ER 500 MG PO TB24: ORAL_TABLET | ORAL | 0 refills | Status: DC

## 2021-09-26 ENCOUNTER — Other Ambulatory Visit: Payer: 59

## 2021-09-26 ENCOUNTER — Ambulatory Visit: Payer: 59 | Admitting: Hematology

## 2021-10-13 ENCOUNTER — Other Ambulatory Visit: Payer: Self-pay | Admitting: Family

## 2021-10-13 DIAGNOSIS — E119 Type 2 diabetes mellitus without complications: Secondary | ICD-10-CM

## 2021-10-13 MED ORDER — METFORMIN HCL ER 500 MG PO TB24: ORAL_TABLET | ORAL | 0 refills | Status: DC

## 2021-10-13 MED ORDER — LIRAGLUTIDE 18 MG/3ML ~~LOC~~ SOPN: PEN_INJECTOR | SUBCUTANEOUS | 0 refills | Status: DC

## 2021-11-13 ENCOUNTER — Other Ambulatory Visit: Payer: Self-pay | Admitting: Family

## 2021-11-13 ENCOUNTER — Telehealth: Payer: Self-pay | Admitting: Family

## 2021-11-13 DIAGNOSIS — E119 Type 2 diabetes mellitus without complications: Secondary | ICD-10-CM

## 2021-11-13 DIAGNOSIS — F411 Generalized anxiety disorder: Secondary | ICD-10-CM

## 2021-11-14 NOTE — Telephone Encounter (Signed)
Patient would like to know why this medication was denied. No apts have been sch at the moment- Last DOS 06/2021 - AVS stated pt needed to be seen 4 weeks after - Please advise.

## 2021-11-14 NOTE — Telephone Encounter (Signed)
LVM for pt letting her know she will need to schedule an appointment if she would like further refills. Pt did not follow up in Feb.

## 2021-11-15 ENCOUNTER — Encounter: Payer: Self-pay | Admitting: Family

## 2021-11-15 ENCOUNTER — Ambulatory Visit: Payer: 59 | Admitting: Family

## 2021-11-15 ENCOUNTER — Ambulatory Visit (INDEPENDENT_AMBULATORY_CARE_PROVIDER_SITE_OTHER): Payer: 59 | Admitting: Family

## 2021-11-15 VITALS — BP 111/78 | HR 103 | Temp 98.6°F | Ht 63.0 in | Wt 230.8 lb

## 2021-11-15 DIAGNOSIS — M7989 Other specified soft tissue disorders: Secondary | ICD-10-CM

## 2021-11-15 DIAGNOSIS — E119 Type 2 diabetes mellitus without complications: Secondary | ICD-10-CM

## 2021-11-15 DIAGNOSIS — K2960 Other gastritis without bleeding: Secondary | ICD-10-CM

## 2021-11-15 DIAGNOSIS — F411 Generalized anxiety disorder: Secondary | ICD-10-CM

## 2021-11-15 MED ORDER — LIRAGLUTIDE 18 MG/3ML ~~LOC~~ SOPN: PEN_INJECTOR | SUBCUTANEOUS | 1 refills | Status: DC

## 2021-11-15 MED ORDER — BUPROPION HCL 100 MG PO TABS: 100.0000 mg | ORAL_TABLET | Freq: Two times a day (BID) | ORAL | 2 refills | Status: DC

## 2021-11-15 MED ORDER — METFORMIN HCL ER 500 MG PO TB24: ORAL_TABLET | ORAL | 0 refills | Status: DC

## 2021-11-15 MED ORDER — PANTOPRAZOLE SODIUM 20 MG PO TBEC: 20.0000 mg | DELAYED_RELEASE_TABLET | Freq: Every day | ORAL | 1 refills | Status: DC

## 2021-11-15 MED ORDER — HYDROCHLOROTHIAZIDE 12.5 MG PO TABS: 12.5000 mg | ORAL_TABLET | Freq: Every morning | ORAL | 1 refills | Status: DC

## 2021-11-15 NOTE — Assessment & Plan Note (Signed)
Chronic - pt reports doing much better on Bupropion, would like to increase to '100mg'$  bid, continue Hydroxyzine prn. f/u 3 mos.

## 2021-11-15 NOTE — Assessment & Plan Note (Signed)
tolerating Metformin & Victoza, up to 1.'2mg'$  qd, states the Metformin helps keep her from being constipated, so will continue for now, though her A1C in good range. f/u 1 month.

## 2021-11-15 NOTE — Assessment & Plan Note (Signed)
pt reports abdominal sx are much better taking the Protonix qd. f/u in 3-79mo.

## 2021-11-15 NOTE — Progress Notes (Signed)
Subjective:     Patient ID: Bonnie Larsen, female    DOB: 28-Mar-1981, 40 y.o.   MRN: [id number]  Chief Complaint  Patient presents with   Anxiety    Pt states her anxiety has been good.    Weight Loss   HPI: Edema: Patient complains of edema. The location of the edema is ankle(s) bilateral, feet bilateral.  The edema has been moderate.  Onset of symptoms was  months ago, gradually improving since that time. The edema is present in the afternoon, in the evening, and at bedtime.  The swelling has been aggravated by dependency of involved area and increased salt intake, relieved by diuretics, support stockings, elevation of involved area,. Cardiac risk factors include diabetes mellitus and obesity (BMI >= 30 kg/m2). GERD: Patient is presenting for follow up. This has been associated with nausea, belching, fullness after meals, heartburn, and midespigastric pain.  She denies chest pain, cough, and hoarseness. Symptoms have been present for  months. She denies dysphagia.  She has not lost weight. She denies melena, hematochezia, hematemesis, and coffee ground emesis. Medical therapy in the past has included proton pump inhibitors. T2DM: Pt is currently maintained on the following medications for diabetes: Metformin, Victoza Failed meds include: none Denies polyuria/polydipsia. Denies hypoglycemia Home glucose readings range: not checking at home. Last A1C 5.9. pt has lost 25lbs. Anxiety/Depression: Patient complains of anxiety symptoms.   She has the following symptoms: irritable, palpitations, racing thoughts, sweating.  Onset of symptoms was approximately  months ago, She denies current suicidal and homicidal ideation.  Possible organic causes contributing are: none.  Risk factors: negative life event cancer  Pt started on Wellbutrin and Vistaril prn last visit, doing much better, feeling more social, less anxiety.  Assessment & Plan:   Problem List Items Addressed This Visit        Digestive   Reflux gastritis    pt reports abdominal sx are much better taking the Protonix qd. f/u in 3-30mo.       Relevant Medications   pantoprazole (PROTONIX) 20 MG tablet     Endocrine   New onset type 2 diabetes mellitus ([place] - Primary    tolerating Metformin & Victoza, up to 1.'2mg'$  qd, states the Metformin helps keep her from being constipated, so will continue for now, though her A1C in good range. f/u 1 month.       Relevant Medications   liraglutide (VICTOZA) 18 MG/3ML SOPN   metFORMIN (GLUCOPHAGE XR) 500 MG 24 hr tablet     Other   Foot swelling    doing better - started HCTZ qd, elevating legs when resting, wearing compression socks during day when on her feet for long periods. f/u 3-6 mos.       Relevant Medications   hydrochlorothiazide (HYDRODIURIL) 12.5 MG tablet   Generalized anxiety disorder    Chronic - pt reports doing much better on Bupropion, would like to increase to '100mg'$  bid, continue Hydroxyzine prn. f/u 3 mos.       Relevant Medications   buPROPion (WELLBUTRIN) 100 MG tablet    Outpatient Medications Prior to Visit  Medication Sig Dispense Refill   fluticasone (FLONASE) 50 MCG/ACT nasal spray Place 2 sprays into both nostrils daily.     levonorgestrel (MIRENA) 20 MCG/24HR IUD 1 each by Intrauterine route once.     Multiple Vitamin (MULTIVITAMIN) tablet Take 1 tablet by mouth daily.     NEEDLE, DISP, 30 G 30G X 1/2" MISC Inject 1 each  as directed daily. 50 each 0   Vitamin D, Ergocalciferol, (DRISDOL) 1.25 MG (50000 UNIT) CAPS capsule Take 50,000 Units by mouth once a week.     buPROPion (WELLBUTRIN) 75 MG tablet Take 1 tablet (75 mg total) by mouth 2 (two) times daily. 60 tablet 2   hydrochlorothiazide (HYDRODIURIL) 12.5 MG tablet Take 1 tablet (12.5 mg total) by mouth in the morning. 90 tablet 1   hydrOXYzine (ATARAX) 10 MG tablet Take 1-2 tablets (10-20 mg total) by mouth every evening. OK to take up to 3 pills at bedtime if needed for sleep,  maximum of 5 pills qd 30 tablet 0   liraglutide (VICTOZA) 18 MG/3ML SOPN Start 0.6 mg [place] qd x 1 week, then 1.2 mg [place] qd thereafter if no nausea 6 mL 0   metFORMIN (GLUCOPHAGE XR) 500 MG 24 hr tablet Start '500mg'$  PO qpm. 30 tablet 0   pantoprazole (PROTONIX) 20 MG tablet Take 1 tablet (20 mg total) by mouth daily. Take 1 pill twice a day for 1 week, then 1 pill every morning. 90 tablet 1   LORazepam (ATIVAN PO) Take by mouth.     Facility-Administered Medications Prior to Visit  Medication Dose Route Frequency Provider Last Rate Last Admin   sodium chloride flush (NS) 0.9 % injection 10 mL  10 mL Intravenous PRN Brunetta Genera, MD   10 mL at 04/16/17 1938    Past Medical History:  Diagnosis Date   Adenopathy 01/10/2017   Upper abdominal. Per CT   Anemia    Anxiety    Bacterial pharyngitis 07/30/2016   Depression    Difficult intravenous access    Family history of adverse reaction to anesthesia    pt's mother has hx. of post-op N/V and hard to wake up post-op   History of panic attacks    02/08/17- has not had a panic attacks   Lymphoma ([place]) 01/2017   Mesenteric lymphadenopathy 01/2017   Morbid obesity with BMI of 40.0-44.9, adult ([place])    Runny nose 02/19/2017   clear drainage, per pt.   Seasonal allergies     Past Surgical History:  Procedure Laterality Date   LYMPH NODE BIOPSY N/A 02/11/2017   Procedure: HAND ASSISTED LAPAROSCOPIC LYMPH NODE BIOPSY;  Surgeon: Stark Klein, MD;  Location: [place];  Service: General;  Laterality: N/A;   PORT-A-CATH REMOVAL N/A 09/04/2017   Procedure: REMOVAL PORT-A-CATH;  Surgeon: Stark Klein, MD;  Location: [place];  Service: General;  Laterality: N/A;   PORTACATH PLACEMENT N/A 02/20/2017   Procedure: INSERTION PORT-A-CATH;  Surgeon: Stark Klein, MD;  Location: [facility];  Service: General;  Laterality: N/A;   TUBAL LIGATION     UMBILICAL HERNIA REPAIR  02/11/2017    Allergies  Allergen Reactions   Clindamycin/Lincomycin  Swelling    FACIAL SWELLING   Morphine    Cymbalta [Duloxetine Hcl] Nausea Only    Causes pt to have nausea and stomach cramping.       Objective:    Physical Exam Vitals and nursing note reviewed.  Constitutional:      Appearance: Normal appearance. She is obese.  Cardiovascular:     Rate and Rhythm: Normal rate and regular rhythm.  Pulmonary:     Effort: Pulmonary effort is normal.     Breath sounds: Normal breath sounds.  Musculoskeletal:        General: Normal range of motion.     Right lower leg: No edema.     Left  lower leg: No edema.  Skin:    General: Skin is warm and dry.  Neurological:     Mental Status: She is alert.  Psychiatric:        Mood and Affect: Mood normal.        Behavior: Behavior normal.    BP 111/78 (BP Location: Left Arm, Patient Position: Sitting, Cuff Size: Large)   Pulse (!) 103   Temp 98.6 F (37 C) (Temporal)   Ht '5\' 3"'$  (1.6 m)   Wt 230 lb 12.8 oz (104.7 kg)   SpO2 94%   BMI 40.88 kg/m  Wt Readings from Last 3 Encounters:  11/15/21 230 lb 12.8 oz (104.7 kg)  07/05/21 255 lb 9.6 oz (115.9 kg)  05/31/21 259 lb (117.5 kg)        Meds ordered this encounter  Medications   liraglutide (VICTOZA) 18 MG/3ML SOPN    Sig: Start 1.2 mg [place] qd and then increase to 1.'8mg'$  as tolerated.    Dispense:  6 mL    Refill:  1   buPROPion (WELLBUTRIN) 100 MG tablet    Sig: Take 1 tablet (100 mg total) by mouth 2 (two) times daily.    Dispense:  60 tablet    Refill:  2   metFORMIN (GLUCOPHAGE XR) 500 MG 24 hr tablet    Sig: Start '500mg'$  PO qpm.    Dispense:  90 tablet    Refill:  0   DISCONTD: pantoprazole (PROTONIX) 20 MG tablet    Sig: Take 1 tablet (20 mg total) by mouth daily. Take 1 pill twice a day for 1 week, then 1 pill every morning.    Dispense:  90 tablet    Refill:  1   hydrochlorothiazide (HYDRODIURIL) 12.5 MG tablet    Sig: Take 1 tablet (12.5 mg total) by mouth in the morning.    Dispense:  90 tablet    Refill:  1    pantoprazole (PROTONIX) 20 MG tablet    Sig: Take 1 tablet (20 mg total) by mouth daily.    Dispense:  90 tablet    Refill:  1    Changed instructions    Order Specific Question:   Supervising Provider    Answer:   ANDY, CAMILLE L [[id number]]    Jeanie Sewer, NP

## 2021-11-15 NOTE — Patient Instructions (Addendum)
It was very nice to see you today!  Great job with your weight loss!! Keep working on exercise & drinking at least 2 liters of water daily.  Continue the Victoza and increase to 1.'8mg'$  when you are ready, but if you are still losing weight on 1.'2mg'$ , just stay at that dose.  Continue the Metformin if helping your bowels, but ok to decrease to every other day or cut in half daily.  Continue the HCTZ for swelling every morning.   Follow up in 1 month.   PLEASE NOTE:  If you had any lab tests please let us know if you have not heard back within a few days. You may see your results on MyChart before we have a chance to review them but we will give you a call once they are reviewed by [place]. If we ordered any referrals today, please let us know if you have not heard from their office within the next week.

## 2021-11-15 NOTE — Assessment & Plan Note (Signed)
doing better - started HCTZ qd, elevating legs when resting, wearing compression socks during day when on her feet for long periods. f/u 3-6 mos.

## 2021-12-13 ENCOUNTER — Ambulatory Visit: Payer: 59 | Admitting: Family

## 2021-12-22 ENCOUNTER — Ambulatory Visit: Payer: 59 | Admitting: Hematology

## 2021-12-22 ENCOUNTER — Other Ambulatory Visit: Payer: 59

## 2022-01-01 LAB — HM MAMMOGRAPHY

## 2022-01-16 ENCOUNTER — Other Ambulatory Visit: Payer: Self-pay | Admitting: Family

## 2022-01-16 DIAGNOSIS — E119 Type 2 diabetes mellitus without complications: Secondary | ICD-10-CM

## 2022-01-23 ENCOUNTER — Other Ambulatory Visit: Payer: Self-pay

## 2022-01-23 ENCOUNTER — Inpatient Hospital Stay (HOSPITAL_BASED_OUTPATIENT_CLINIC_OR_DEPARTMENT_OTHER): Payer: 59 | Admitting: Hematology

## 2022-01-23 ENCOUNTER — Other Ambulatory Visit: Payer: Self-pay | Admitting: Family

## 2022-01-23 ENCOUNTER — Inpatient Hospital Stay: Payer: 59 | Attending: Hematology

## 2022-01-23 VITALS — BP 113/71 | HR 71 | Temp 97.9°F | Resp 17 | Ht 63.0 in | Wt 227.0 lb

## 2022-01-23 DIAGNOSIS — Z79899 Other long term (current) drug therapy: Secondary | ICD-10-CM | POA: Diagnosis not present

## 2022-01-23 DIAGNOSIS — C8108 Nodular lymphocyte predominant Hodgkin lymphoma, lymph nodes of multiple sites: Secondary | ICD-10-CM | POA: Diagnosis present

## 2022-01-23 DIAGNOSIS — E119 Type 2 diabetes mellitus without complications: Secondary | ICD-10-CM

## 2022-01-23 DIAGNOSIS — Z8269 Family history of other diseases of the musculoskeletal system and connective tissue: Secondary | ICD-10-CM | POA: Insufficient documentation

## 2022-01-23 DIAGNOSIS — R109 Unspecified abdominal pain: Secondary | ICD-10-CM | POA: Insufficient documentation

## 2022-01-23 DIAGNOSIS — Z803 Family history of malignant neoplasm of breast: Secondary | ICD-10-CM | POA: Insufficient documentation

## 2022-01-23 DIAGNOSIS — Z806 Family history of leukemia: Secondary | ICD-10-CM | POA: Insufficient documentation

## 2022-01-23 DIAGNOSIS — Z8249 Family history of ischemic heart disease and other diseases of the circulatory system: Secondary | ICD-10-CM | POA: Diagnosis not present

## 2022-01-23 DIAGNOSIS — Z833 Family history of diabetes mellitus: Secondary | ICD-10-CM | POA: Diagnosis not present

## 2022-01-23 DIAGNOSIS — Z8379 Family history of other diseases of the digestive system: Secondary | ICD-10-CM | POA: Diagnosis not present

## 2022-01-23 DIAGNOSIS — F419 Anxiety disorder, unspecified: Secondary | ICD-10-CM | POA: Insufficient documentation

## 2022-01-23 LAB — CMP (CANCER CENTER ONLY)
ALT: 11 U/L (ref 0–44)
AST: 16 U/L (ref 15–41)
Albumin: 4.5 g/dL (ref 3.5–5.0)
Alkaline Phosphatase: 44 U/L (ref 38–126)
Anion gap: 8 (ref 5–15)
BUN: 15 mg/dL (ref 6–20)
CO2: 30 mmol/L (ref 22–32)
Calcium: 9.8 mg/dL (ref 8.9–10.3)
Chloride: 100 mmol/L (ref 98–111)
Creatinine: 1.09 mg/dL — ABNORMAL HIGH (ref 0.44–1.00)
GFR, Estimated: >60 mL/min (ref >60)
Glucose, Bld: 105 mg/dL — ABNORMAL HIGH (ref 70–99)
Potassium: 3.9 mmol/L (ref 3.5–5.1)
Sodium: 138 mmol/L (ref 135–145)
Total Bilirubin: 0.4 mg/dL (ref 0.3–1.2)
Total Protein: 7.5 g/dL (ref 6.5–8.1)

## 2022-01-23 LAB — CBC WITH DIFFERENTIAL/PLATELET
Abs Immature Granulocytes: 0.01 10*3/uL (ref 0.00–0.07)
Basophils Absolute: 0 10*3/uL (ref 0.0–0.1)
Basophils Relative: 0 %
Eosinophils Absolute: 0.1 10*3/uL (ref 0.0–0.5)
Eosinophils Relative: 1 %
HCT: 43.6 % (ref 36.0–46.0)
Hemoglobin: 14.2 g/dL (ref 12.0–15.0)
Immature Granulocytes: 0 %
Lymphocytes Relative: 23 %
Lymphs Abs: 1.3 10*3/uL (ref 0.7–4.0)
MCH: 25.8 pg — ABNORMAL LOW (ref 26.0–34.0)
MCHC: 32.6 g/dL (ref 30.0–36.0)
MCV: 79.3 fL — ABNORMAL LOW (ref 80.0–100.0)
Monocytes Absolute: 0.5 10*3/uL (ref 0.1–1.0)
Monocytes Relative: 9 %
Neutro Abs: 3.7 10*3/uL (ref 1.7–7.7)
Neutrophils Relative %: 67 %
Platelets: 255 10*3/uL (ref 150–400)
RBC: 5.5 MIL/uL — ABNORMAL HIGH (ref 3.87–5.11)
RDW: 13.7 % (ref 11.5–15.5)
WBC: 5.6 10*3/uL (ref 4.0–10.5)
nRBC: 0 % (ref 0.0–0.2)

## 2022-01-23 LAB — LACTATE DEHYDROGENASE: LDH: 106 U/L (ref 98–192)

## 2022-01-23 NOTE — Telephone Encounter (Signed)
Duplicate request , refill already responded to.

## 2022-01-24 ENCOUNTER — Telehealth: Payer: Self-pay | Admitting: Hematology

## 2022-01-24 NOTE — Telephone Encounter (Signed)
Scheduled follow-up appointment per 8/1 los. Patient is aware.

## 2022-01-29 ENCOUNTER — Encounter: Payer: Self-pay | Admitting: Hematology

## 2022-01-29 NOTE — Progress Notes (Signed)
HEMATOLOGY/ONCOLOGY PHONE VISIT NOTE  Date of Service: 01/23/2022  Patient Care Team: Jeanie Sewer, NP as PCP - General (Family Medicine)  CHIEF COMPLAINTS/PURPOSE OF CONSULTATION:   Follow-up for continued evaluation and management of nodular lymphocyte predominant Hodgkin's lymphoma  HISTORY OF PRESENTING ILLNESS:   Please see previous note for details on initial presentation.  PREVIOUS THERAPY:    R-CHOP x 6 cycles (Fanale et al 2010)   INTERVAL HISTORY:  Patient is seen in follow-up for her nodular lymphocyte predominant Hodgkin's lymphoma after her last clinic visit more than 6 months ago.  She notes no significant new abdominal discomfort.  No chest pain or shortness of breath.  No new lumps or bumps.  Notes that her anxiety has been better controlled. Good energy levels.  Has been staying active to try to lose some weight. Labs done today were discussed in detail with the patient.Marland Kitchen   MEDICAL HISTORY:  Past Medical History:  Diagnosis Date   Adenopathy 01/10/2017   Upper abdominal. Per CT   Anemia    Anxiety    Bacterial pharyngitis 07/30/2016   Counseling regarding advanced care planning and goals of care 06/24/2017   Depression    Difficult intravenous access    Family history of adverse reaction to anesthesia    pt's mother has hx. of post-op N/V and hard to wake up post-op   History of panic attacks    02/08/17- has not had a panic attacks   Lymphoma ([place]) 01/2017   Mesenteric lymphadenopathy 01/2017   Morbid obesity with BMI of 40.0-44.9, adult ([place])    Need for immunization against influenza 05/31/2021   Runny nose 02/19/2017   clear drainage, per pt.   Seasonal allergies     SURGICAL HISTORY: Past Surgical History:  Procedure Laterality Date   LYMPH NODE BIOPSY N/A 02/11/2017   Procedure: HAND ASSISTED LAPAROSCOPIC LYMPH NODE BIOPSY;  Surgeon: Stark Klein, MD;  Location: [place];  Service: General;  Laterality: N/A;   PORT-A-CATH REMOVAL N/A  09/04/2017   Procedure: REMOVAL PORT-A-CATH;  Surgeon: Stark Klein, MD;  Location: [facility];  Service: General;  Laterality: N/A;   PORTACATH PLACEMENT N/A 02/20/2017   Procedure: INSERTION PORT-A-CATH;  Surgeon: Stark Klein, MD;  Location: [place];  Service: General;  Laterality: N/A;   TUBAL LIGATION     UMBILICAL HERNIA REPAIR  02/11/2017    SOCIAL HISTORY: Social History   Socioeconomic History   Marital status: Married    Spouse name: Gwyndolyn Saxon   Number of children: 2   Years of education: Not on file   Highest education level: Associate degree: occupational, Hotel manager, or vocational program  Occupational History   Not on file  Tobacco Use   Smoking status: Never   Smokeless tobacco: Never  Vaping Use   Vaping Use: Never used  Substance and Sexual Activity   Alcohol use: No    Alcohol/week: 0.0 standard drinks of alcohol   Drug use: No   Sexual activity: Yes    Partners: Male    Birth control/protection: I.U.D.    Comment: tubal ligation  Other Topics Concern   Not on file  Social History Narrative   Not on file   Social Determinants of Health   Financial Resource Strain: Low Risk  (09/03/2017)   Overall Financial Resource Strain (CARDIA)    Difficulty of Paying Living Expenses: Not hard at all  Food Insecurity: No Food Insecurity (09/03/2017)   Hunger Vital Sign  Worried About Charity fundraiser in the Last Year: Never true    [place] in the Last Year: Never true  Transportation Needs: No Transportation Needs (09/03/2017)   PRAPARE - Hydrologist (Medical): No    Lack of Transportation (Non-Medical): No  Physical Activity: Inactive (09/03/2017)   Exercise Vital Sign    Days of Exercise per Week: 0 days    Minutes of Exercise per Session: 0 min  Stress: Stress Concern Present (09/03/2017)   [place]    Feeling of Stress : Rather much   Social Connections: Somewhat Isolated (09/03/2017)   Social Connection and Isolation Panel [NHANES]    Frequency of Communication with Friends and Family: More than three times a week    Frequency of Social Gatherings with Friends and Family: Never    Attends Religious Services: Never    Marine scientist or Organizations: No    Attends Archivist Meetings: Never    Marital Status: Married  Human resources officer Violence: Not At Risk (09/03/2017)   Humiliation, Afraid, Rape, and Kick questionnaire    Fear of Current or Ex-Partner: No    Emotionally Abused: No    Physically Abused: No    Sexually Abused: No    FAMILY HISTORY: Family History  Problem Relation Age of Onset   Cancer Mother    Arthritis Mother    Breast cancer Mother 62   Fibromyalgia Mother    Rheum arthritis Mother    Neuropathy Mother    Anesthesia problems Mother        post-op N/V; hard to wake up post-op   Early death Father    Diabetes Father    COPD Father    Alcohol abuse Father    Congestive Heart Failure Father        died at age 76   Cirrhosis Father    Other Brother        Benign spinal tumor   Heart disease Maternal Grandmother    Diabetes Maternal Grandmother    Cancer Paternal Grandfather        Leukemia    ALLERGIES:  is allergic to clindamycin/lincomycin, morphine, and cymbalta [duloxetine hcl].  MEDICATIONS:  Current Outpatient Medications  Medication Sig Dispense Refill   buPROPion (WELLBUTRIN) 100 MG tablet Take 1 tablet (100 mg total) by mouth 2 (two) times daily. 60 tablet 2   fluticasone (FLONASE) 50 MCG/ACT nasal spray Place 2 sprays into both nostrils daily.     hydrochlorothiazide (HYDRODIURIL) 12.5 MG tablet Take 1 tablet (12.5 mg total) by mouth in the morning. 90 tablet 1   levonorgestrel (MIRENA) 20 MCG/24HR IUD 1 each by Intrauterine route once.     liraglutide (VICTOZA) 18 MG/3ML SOPN START 1.2 MG SUBCUTANEOUSLY EVERY DAY AND THEN INCREASE TO 1.8 MG AS TOLERATED  6 mL 0   metFORMIN (GLUCOPHAGE XR) 500 MG 24 hr tablet Start '500mg'$  PO qpm. 90 tablet 0   Multiple Vitamin (MULTIVITAMIN) tablet Take 1 tablet by mouth daily.     NEEDLE, DISP, 30 G 30G X 1/2" MISC Inject 1 each as directed daily. 50 each 0   pantoprazole (PROTONIX) 20 MG tablet Take 1 tablet (20 mg total) by mouth daily. 90 tablet 1   Vitamin D, Ergocalciferol, (DRISDOL) 1.25 MG (50000 UNIT) CAPS capsule Take 50,000 Units by mouth once a week.     No current facility-administered medications for this visit.  Facility-Administered Medications Ordered in Other Visits  Medication Dose Route Frequency Provider Last Rate Last Admin   sodium chloride flush (NS) 0.9 % injection 10 mL  10 mL Intravenous PRN Brunetta Genera, MD   10 mL at 04/16/17 1938    REVIEW OF SYSTEMS:  10 Point review of Systems was done is negative except as noted above.  This is a PHYSICAL EXAMINATION:  .BP 113/71 (BP Location: Left Arm, Patient Position: Sitting)   Pulse 71   Temp 97.9 F (36.6 C) (Temporal)   Resp 17   Ht '5\' 3"'$  (1.6 m)   Wt 227 lb (103 kg)   SpO2 98%   BMI 40.21 kg/m  . GENERAL:alert, in no acute distress and comfortable SKIN: no acute rashes, no significant lesions EYES: conjunctiva are pink and non-injected, sclera anicteric OROPHARYNX: MMM, no exudates, no oropharyngeal erythema or ulceration NECK: supple, no JVD LYMPH:  no palpable lymphadenopathy in the cervical, axillary or inguinal regions LUNGS: clear to auscultation b/l with normal respiratory effort HEART: regular rate & rhythm ABDOMEN:  normoactive bowel sounds , non tender, not distended. Extremity: no pedal edema PSYCH: alert & oriented x 3 with fluent speech NEURO: no focal motor/sensory deficits   LABORATORY DATA:  I have reviewed the data as listed     Latest Ref Rng & Units 01/23/2022    2:13 PM 05/30/2021   12:44 PM 05/27/2021    9:46 PM  CBC  WBC 4.0 - 10.5 K/uL 5.6  5.3  6.4   Hemoglobin 12.0 - 15.0 g/dL  14.2  13.7  13.6   Hematocrit 36.0 - 46.0 % 43.6  43.0  44.4   Platelets 150 - 400 K/uL 255  232  236       Latest Ref Rng & Units 01/23/2022    2:13 PM 05/30/2021   12:44 PM 05/27/2021    9:46 PM  CMP  Glucose 70 - 99 mg/dL 105  129  109   BUN 6 - 20 mg/dL '15  11  13   '$ Creatinine 0.44 - 1.00 mg/dL 1.09  1.06  1.12   Sodium 135 - 145 mmol/L 138  141  138   Potassium 3.5 - 5.1 mmol/L 3.9  4.0  3.9   Chloride 98 - 111 mmol/L 100  105  103   CO2 22 - 32 mmol/L '30  25  28   '$ Calcium 8.9 - 10.3 mg/dL 9.8  9.4  9.3   Total Protein 6.5 - 8.1 g/dL 7.5  7.1  6.7   Total Bilirubin 0.3 - 1.2 mg/dL 0.4  0.4  0.4   Alkaline Phos 38 - 126 U/L 44  56  55   AST 15 - 41 U/L '16  17  20   '$ ALT 0 - 44 U/L '11  11  15    '$ . Lab Results  Component Value Date   LDH 106 01/23/2022    Lymphnode biopsy, 02/11/2017 Diagnosis Lymph node for lymphoma, Mesenteric Lymphadenopathy - HODGKIN LYMPHOMA.         PROCEDURES  ECHO 03/13/17 Study Conclusions  - Left ventricle: The cavity size was normal. Systolic function was  normal. The estimated ejection fraction was in the range of 55%  to 60%. Wall motion was normal; there were no regional wall  motion abnormalities. Left ventricular diastolic function  parameters were normal. - Atrial septum: No defect or patent foramen ovale was identified. - Impressions: Normal GLS -20.2.   RADIOGRAPHIC STUDIES: I have personally  reviewed the radiological images as listed and agreed with the findings in the report. No results found.    ASSESSMENT & PLAN:   41 y.o. 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 Previously this  was Deauville 5.  S/p R-CHOP x 6 cycles  PET/CT 08/06/2017: No evidence active lymphoma with minimal activity within the remaining periaortic and mesenteric lymph nodes - Deauville 2  2) Epigastric abdominal discomfort Significantly improved with PPI therapy. PLAN -No clinical signs or symptoms suggestive of lymphoma recurrence/progression -Labs done today show normal CBC stable CMP and normal LDH levels -Patient has no lab or clinical evidence of disease recurrence/progression. No indication for additional treatment of the patient's nodular lymphocyte predominant Hodgkin's lymphoma at this time. 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   Foot swelling 07/05/2021   Generalized anxiety disorder 07/05/2021   New onset type 2 diabetes mellitus ([place]) 06/08/2021   Morbid obesity ([place]) 05/31/2021   Right upper quadrant abdominal pain 05/31/2021   Migraine 09/03/2017   Counseling regarding advanced care planning and goals of care 06/24/2017   Nodular lymphocyte predominant Hodgkin lymphoma of lymph nodes of multiple regions ([place]) 03/19/2017   Mesenteric lymphadenopathy 02/11/2017   Paraspinal mass 01/07/2017   Abnormal chest CT 01/07/2017   History of panic attacks    Obesity, Class II, BMI 35-39.9, isolated 04/25/2015  -continue f/u with PCP for other chronic medical issues  FOLLOW UP: RTC with Dr [name] with labs in 6 months  The total time spent in the appointment was 20 minutes*.  All of the patient's questions were answered with apparent satisfaction. The patient knows to call the clinic with any problems, questions or concerns.   Sullivan Lone MD MS AAHIVMS [facility] Hematology/Oncology Physician [facility]  .*Total Encounter Time as defined by the Centers for Medicare and Medicaid Services includes, in addition to the face-to-face time of a patient visit (documented in the note above)  non-face-to-face time: obtaining and reviewing outside history, ordering and reviewing medications, tests or procedures, care coordination (communications with other health care professionals or caregivers) and documentation in the medical record.

## 2022-03-13 ENCOUNTER — Other Ambulatory Visit: Payer: Self-pay | Admitting: Family

## 2022-03-13 DIAGNOSIS — E119 Type 2 diabetes mellitus without complications: Secondary | ICD-10-CM

## 2022-03-13 DIAGNOSIS — F411 Generalized anxiety disorder: Secondary | ICD-10-CM

## 2022-03-13 MED ORDER — VICTOZA 18 MG/3ML ~~LOC~~ SOPN: PEN_INJECTOR | SUBCUTANEOUS | 0 refills | Status: DC

## 2022-03-13 MED ORDER — METFORMIN HCL ER 500 MG PO TB24: ORAL_TABLET | ORAL | 0 refills | Status: DC

## 2022-03-19 ENCOUNTER — Encounter: Payer: Self-pay | Admitting: *Deleted

## 2022-03-27 LAB — HM PAP SMEAR: HM Pap smear: NORMAL

## 2022-04-23 ENCOUNTER — Other Ambulatory Visit: Payer: Self-pay | Admitting: Family

## 2022-04-23 DIAGNOSIS — E119 Type 2 diabetes mellitus without complications: Secondary | ICD-10-CM

## 2022-04-23 MED ORDER — VICTOZA 18 MG/3ML ~~LOC~~ SOPN: PEN_INJECTOR | SUBCUTANEOUS | 0 refills | Status: DC

## 2022-05-07 ENCOUNTER — Ambulatory Visit (INDEPENDENT_AMBULATORY_CARE_PROVIDER_SITE_OTHER): Payer: 59 | Admitting: Family

## 2022-05-07 ENCOUNTER — Encounter: Payer: Self-pay | Admitting: Family

## 2022-05-07 VITALS — BP 106/68 | HR 100 | Temp 98.4°F | Ht 63.0 in | Wt 239.6 lb

## 2022-05-07 DIAGNOSIS — K439 Ventral hernia without obstruction or gangrene: Secondary | ICD-10-CM

## 2022-05-07 NOTE — Progress Notes (Signed)
Patient ID: Bonnie Larsen, female    DOB: 1981-05-26, 41 y.o.   MRN: [id number]  Chief Complaint  Patient presents with   soreness    Pt states soreness breast bone to naval for a week. Pt states when she moves a certain way she notices a lump in the middle of her abdomin     HPI:   Abdominal pain:    hx of umbilical hernia and had repair along w/lymph node removal. First noticed this hernia about 4 years ago and then became less noticeable or painful until recently.  She reports doing some stretches lately and noticed it more. But also feels sometimes when sitting for awhile, denies pain after meals. Feels achy pain if she is driving a long time or sitting up in chair working for long time. Per chart review, she does have an umbilical hernia as of 52/7782.       Assessment & Plan:  1. Hernia of abdominal wall - pt has hx of umbilical hernia repair years ago, this hernia is proximal to umbilicus, appears as more ventral location possible diastasis recti, but does not run length of abd, last CT scan in 05/2021 which showed active umbilical hernia only. will refer to surgery to see if update CT scan needed and pt can discuss options. Advised pt on no heavy lifting or pulling/pushing.  - Ambulatory referral to General Surgery   Subjective:    Outpatient Medications Prior to Visit  Medication Sig Dispense Refill   buPROPion (WELLBUTRIN) 100 MG tablet Take 1 tablet by mouth twice daily 180 tablet 1   fluticasone (FLONASE) 50 MCG/ACT nasal spray Place 2 sprays into both nostrils daily.     hydrochlorothiazide (HYDRODIURIL) 12.5 MG tablet Take 1 tablet (12.5 mg total) by mouth in the morning. 90 tablet 1   levonorgestrel (MIRENA) 20 MCG/24HR IUD 1 each by Intrauterine route once.     liraglutide (VICTOZA) 18 MG/3ML SOPN START 1.2 MG SUBCUTANEOUSLY EVERY DAY AND THEN INCREASE TO 1.8 MG AS TOLERATED 6 mL 0   metFORMIN (GLUCOPHAGE XR) 500 MG 24 hr tablet Start '500mg'$  PO qpm. 90 tablet 0    Multiple Vitamin (MULTIVITAMIN) tablet Take 1 tablet by mouth daily.     NEEDLE, DISP, 30 G 30G X 1/2" MISC Inject 1 each as directed daily. 50 each 0   pantoprazole (PROTONIX) 20 MG tablet Take 1 tablet (20 mg total) by mouth daily. 90 tablet 1   Vitamin D, Ergocalciferol, (DRISDOL) 1.25 MG (50000 UNIT) CAPS capsule Take 50,000 Units by mouth once a week.     Facility-Administered Medications Prior to Visit  Medication Dose Route Frequency Provider Last Rate Last Admin   sodium chloride flush (NS) 0.9 % injection 10 mL  10 mL Intravenous PRN Brunetta Genera, MD   10 mL at 04/16/17 1938   Past Medical History:  Diagnosis Date   Adenopathy 01/10/2017   Upper abdominal. Per CT   Anemia    Anxiety    Bacterial pharyngitis 07/30/2016   Counseling regarding advanced care planning and goals of care 06/24/2017   Depression    Difficult intravenous access    Family history of adverse reaction to anesthesia    pt's mother has hx. of post-op N/V and hard to wake up post-op   History of panic attacks    02/08/17- has not had a panic attacks   Lymphoma ([place]) 01/2017   Mesenteric lymphadenopathy 01/2017   Morbid obesity with BMI of 40.0-44.9, adult (  [place])    Need for immunization against influenza 05/31/2021   Runny nose 02/19/2017   clear drainage, per pt.   Seasonal allergies    Past Surgical History:  Procedure Laterality Date   LYMPH NODE BIOPSY N/A 02/11/2017   Procedure: HAND ASSISTED LAPAROSCOPIC LYMPH NODE BIOPSY;  Surgeon: Stark Klein, MD;  Location: [facility];  Service: General;  Laterality: N/A;   PORT-A-CATH REMOVAL N/A 09/04/2017   Procedure: REMOVAL PORT-A-CATH;  Surgeon: Stark Klein, MD;  Location: [place];  Service: General;  Laterality: N/A;   PORTACATH PLACEMENT N/A 02/20/2017   Procedure: INSERTION PORT-A-CATH;  Surgeon: Stark Klein, MD;  Location: [facility];  Service: General;  Laterality: N/A;   TUBAL LIGATION     UMBILICAL HERNIA REPAIR  02/11/2017    Allergies  Allergen Reactions   Clindamycin/Lincomycin Swelling    FACIAL SWELLING   Morphine    Cymbalta [Duloxetine Hcl] Nausea Only    Causes pt to have nausea and stomach cramping.      Objective:    Physical Exam Vitals and nursing note reviewed.  Constitutional:      Appearance: Normal appearance.  Cardiovascular:     Rate and Rhythm: Normal rate and regular rhythm.  Pulmonary:     Effort: Pulmonary effort is normal.     Breath sounds: Normal breath sounds.  Abdominal:     Tenderness: There is abdominal tenderness in the periumbilical area.     Hernia: A hernia is present. Hernia is present in the ventral area.  Musculoskeletal:        General: Normal range of motion.  Skin:    General: Skin is warm and dry.  Neurological:     Mental Status: She is alert.  Psychiatric:        Mood and Affect: Mood normal.        Behavior: Behavior normal.    BP 106/68 (BP Location: Left Arm, Patient Position: Sitting)   Pulse 100   Temp 98.4 F (36.9 C) (Temporal)   Ht '5\' 3"'$  (1.6 m)   Wt 239 lb 9.6 oz (108.7 kg)   SpO2 98%   BMI 42.44 kg/m  Wt Readings from Last 3 Encounters:  05/07/22 239 lb 9.6 oz (108.7 kg)  01/23/22 227 lb (103 kg)  11/15/21 230 lb 12.8 oz (104.7 kg)       Jeanie Sewer, NP

## 2022-05-09 ENCOUNTER — Other Ambulatory Visit: Payer: Self-pay | Admitting: Family

## 2022-05-09 DIAGNOSIS — E119 Type 2 diabetes mellitus without complications: Secondary | ICD-10-CM

## 2022-05-24 DIAGNOSIS — K429 Umbilical hernia without obstruction or gangrene: Secondary | ICD-10-CM | POA: Diagnosis not present

## 2022-05-24 DIAGNOSIS — M6208 Separation of muscle (nontraumatic), other site: Secondary | ICD-10-CM | POA: Diagnosis not present

## 2022-05-28 ENCOUNTER — Encounter: Payer: Self-pay | Admitting: Hematology

## 2022-05-30 ENCOUNTER — Encounter: Payer: Self-pay | Admitting: Family

## 2022-05-30 ENCOUNTER — Ambulatory Visit (INDEPENDENT_AMBULATORY_CARE_PROVIDER_SITE_OTHER): Payer: 59 | Admitting: Family

## 2022-05-30 VITALS — BP 119/88 | HR 100 | Temp 97.7°F | Ht 63.0 in | Wt 238.1 lb

## 2022-05-30 DIAGNOSIS — R252 Cramp and spasm: Secondary | ICD-10-CM

## 2022-05-30 NOTE — Progress Notes (Signed)
Patient ID: Bonnie Larsen, female    DOB: 1981-03-19, 41 y.o.   MRN: [id number]  Chief Complaint  Patient presents with   Leg Pain    Pt c/o Bilateral leg cramping that starts at the ankle or back of knee to up the leg off an on for the past 2 week. Pt states it wakes her up at night. Has been worse for the past couple days. Has tried tylenol and a teaspoon of mustard to stop the pain, which does help sometimes depending on pain.     HPI:      Bilateral leg pain:  over last 2 weeks and happens mostly in evenings, but also during day some, denies any more increased swelling than usual, takes HCTZ 12.'5mg'$  qam, drinking about 1 liter of water daily, denies any increased sodium in diet, or eating more take-out foods.   Assessment & Plan:  1. Leg cramping - checking electrolytes today, pt has 2+ LLE.  advised pt on daily hydration, trying OTC magnesium (provided type, dose, frequency), performing calf stretches during day & prior to bedtime, and wearing mild compression socks during the day, elevating legs during day when able.   - Basic Metabolic Panel (BMET) - Magnesium   Subjective:    Outpatient Medications Prior to Visit  Medication Sig Dispense Refill   buPROPion (WELLBUTRIN) 100 MG tablet Take 1 tablet by mouth twice daily 180 tablet 1   fluticasone (FLONASE) 50 MCG/ACT nasal spray Place 2 sprays into both nostrils daily.     hydrochlorothiazide (HYDRODIURIL) 12.5 MG tablet Take 1 tablet (12.5 mg total) by mouth in the morning. 90 tablet 1   levonorgestrel (MIRENA) 20 MCG/24HR IUD 1 each by Intrauterine route once.     liraglutide (VICTOZA) 18 MG/3ML SOPN START 1.'2MG'$  SUBCUTANEOUSLY EVERY DAY AND THEN INCREASE TO 1.'8MG'$  AS TOLERATED 6 mL 0   metFORMIN (GLUCOPHAGE XR) 500 MG 24 hr tablet Start '500mg'$  PO qpm. 90 tablet 0   Multiple Vitamin (MULTIVITAMIN) tablet Take 1 tablet by mouth daily.     NEEDLE, DISP, 30 G 30G X 1/2" MISC Inject 1 each as directed daily. 50 each 0   pantoprazole  (PROTONIX) 20 MG tablet Take 1 tablet (20 mg total) by mouth daily. 90 tablet 1   Vitamin D, Ergocalciferol, (DRISDOL) 1.25 MG (50000 UNIT) CAPS capsule Take 50,000 Units by mouth once a week.     Facility-Administered Medications Prior to Visit  Medication Dose Route Frequency Provider Last Rate Last Admin   sodium chloride flush (NS) 0.9 % injection 10 mL  10 mL Intravenous PRN Brunetta Genera, MD   10 mL at 04/16/17 1938   Past Medical History:  Diagnosis Date   Adenopathy 01/10/2017   Upper abdominal. Per CT   Anemia    Anxiety    Bacterial pharyngitis 07/30/2016   Counseling regarding advanced care planning and goals of care 06/24/2017   Depression    Difficult intravenous access    Family history of adverse reaction to anesthesia    pt's mother has hx. of post-op N/V and hard to wake up post-op   History of panic attacks    02/08/17- has not had a panic attacks   Lymphoma ([place]) 01/2017   Mesenteric lymphadenopathy 01/2017   Morbid obesity with BMI of 40.0-44.9, adult ([place])    Need for immunization against influenza 05/31/2021   Runny nose 02/19/2017   clear drainage, per pt.   Seasonal allergies    Past Surgical History:  Procedure Laterality Date   LYMPH NODE BIOPSY N/A 02/11/2017   Procedure: HAND ASSISTED LAPAROSCOPIC LYMPH NODE BIOPSY;  Surgeon: Stark Klein, MD;  Location: [place];  Service: General;  Laterality: N/A;   PORT-A-CATH REMOVAL N/A 09/04/2017   Procedure: REMOVAL PORT-A-CATH;  Surgeon: Stark Klein, MD;  Location: [place];  Service: General;  Laterality: N/A;   PORTACATH PLACEMENT N/A 02/20/2017   Procedure: INSERTION PORT-A-CATH;  Surgeon: Stark Klein, MD;  Location: [place];  Service: General;  Laterality: N/A;   TUBAL LIGATION     UMBILICAL HERNIA REPAIR  02/11/2017   Allergies  Allergen Reactions   Clindamycin/Lincomycin Swelling    FACIAL SWELLING   Morphine    Cymbalta [Duloxetine Hcl] Nausea Only    Causes pt to have nausea  and stomach cramping.      Objective:    Physical Exam Vitals and nursing note reviewed.  Constitutional:      Appearance: Normal appearance.  Cardiovascular:     Rate and Rhythm: Normal rate and regular rhythm.  Pulmonary:     Effort: Pulmonary effort is normal.     Breath sounds: Normal breath sounds.  Musculoskeletal:        General: Normal range of motion.     Right lower leg: 2+ Edema present.     Left lower leg: 2+ Edema present.  Skin:    General: Skin is warm and dry.  Neurological:     Mental Status: She is alert.  Psychiatric:        Mood and Affect: Mood normal.        Behavior: Behavior normal.    BP 119/88 (BP Location: Left Arm, Patient Position: Sitting, Cuff Size: Large)   Pulse 100   Temp 97.7 F (36.5 C) (Temporal)   Ht '5\' 3"'$  (1.6 m)   Wt 238 lb 2 oz (108 kg)   LMP  (LMP Unknown)   SpO2 96%   BMI 42.18 kg/m  Wt Readings from Last 3 Encounters:  05/30/22 238 lb 2 oz (108 kg)  05/07/22 239 lb 9.6 oz (108.7 kg)  01/23/22 227 lb (103 kg)       Jeanie Sewer, NP

## 2022-05-30 NOTE — Patient Instructions (Addendum)
It was very nice to see you today!  I will review your lab results via MyChart in a few days.  Try your compression socks to help with your leg circulation. Wear these during the day and take off at night. OK to elevate your legs during the night and try to during the day as well when able, even with your heart or higher.  Look for chelated Magnesium Taurate, Glycinate or L-threonate and start with 1 pill before bedtime then increase to max dose that the bottle indicates gradually just making sure your bowels are not too loose.          PLEASE NOTE:  If you had any lab tests please let us know if you have not heard back within a few days. You may see your results on MyChart before we have a chance to review them but we will give you a call once they are reviewed by [place]. If we ordered any referrals today, please let us know if you have not heard from their office within the next week.

## 2022-05-31 LAB — BASIC METABOLIC PANEL
BUN: 12 mg/dL (ref 6–23)
CO2: 31 mEq/L (ref 19–32)
Calcium: 10 mg/dL (ref 8.4–10.5)
Chloride: 101 mEq/L (ref 96–112)
Creatinine, Ser: 1.14 mg/dL (ref 0.40–1.20)
GFR: 58.87 mL/min — ABNORMAL LOW (ref >60.00)
Glucose, Bld: 92 mg/dL (ref 70–99)
Potassium: 4.8 mEq/L (ref 3.5–5.1)
Sodium: 142 mEq/L (ref 135–145)

## 2022-05-31 LAB — MAGNESIUM: Magnesium: 2 mg/dL (ref 1.5–2.5)

## 2022-05-31 NOTE — Progress Notes (Signed)
Your electrolytes look good, but your kidney function is down slightly. Like we discussed, bump up your water intake to 64oz daily, starting in the morning.  Take care :-)

## 2022-06-05 ENCOUNTER — Encounter: Payer: Self-pay | Admitting: Hematology

## 2022-06-13 ENCOUNTER — Encounter: Payer: Self-pay | Admitting: Family

## 2022-06-13 ENCOUNTER — Ambulatory Visit (INDEPENDENT_AMBULATORY_CARE_PROVIDER_SITE_OTHER): Payer: 59 | Admitting: Family

## 2022-06-13 VITALS — BP 119/82 | HR 117 | Temp 98.1°F | Ht 63.0 in | Wt 232.0 lb

## 2022-06-13 DIAGNOSIS — R059 Cough, unspecified: Secondary | ICD-10-CM

## 2022-06-13 DIAGNOSIS — J101 Influenza due to other identified influenza virus with other respiratory manifestations: Secondary | ICD-10-CM | POA: Diagnosis not present

## 2022-06-13 LAB — POCT INFLUENZA A/B
Influenza A, POC: POSITIVE — AB
Influenza B, POC: NEGATIVE

## 2022-06-13 LAB — POC COVID19 BINAXNOW: SARS Coronavirus 2 Ag: NEGATIVE

## 2022-06-13 MED ORDER — CHERATUSSIN AC 100-10 MG/5ML PO SOLN: 5.0000 mL | Freq: Three times a day (TID) | ORAL | 0 refills | Status: DC | PRN

## 2022-06-13 MED ORDER — OSELTAMIVIR PHOSPHATE 75 MG PO CAPS: 75.0000 mg | ORAL_CAPSULE | Freq: Two times a day (BID) | ORAL | 0 refills | Status: DC

## 2022-06-13 MED ORDER — BENZONATATE 200 MG PO CAPS: 200.0000 mg | ORAL_CAPSULE | Freq: Three times a day (TID) | ORAL | 0 refills | Status: AC | PRN

## 2022-06-13 NOTE — Progress Notes (Signed)
Patient ID: Bonnie Larsen, female    DOB: 05/24/1981, 41 y.o.   MRN: [id number]  Chief Complaint  Patient presents with   Cough    Pt c/o Dry cough, chill, body aches, Nasal/chest congestion, Headaches, Present for 3 days. Has tried mucinex which did not help. Symptoms are getting worse.   HPI:      URI sx:  Pt c/o Dry cough, chill, body aches, Nasal/chest congestion, Headaches, Present for 3 days, but states it hit her hard and fast on Monday with fever, has had chills since. Has tried mucinex which did not help. Symptoms are getting worse. Pt did not get flu shot yet this year.   Assessment & Plan:  1. Cough, unspecified type rapid covid neg. + flu  - POC COVID-19 - POCT Influenza A/B - benzonatate (TESSALON) 200 MG capsule; Take 1 capsule (200 mg total) by mouth 3 (three) times daily as needed for up to 10 days for cough.  Dispense: 30 capsule; Refill: 0 - guaiFENesin-codeine (CHERATUSSIN AC) 100-10 MG/5ML syrup; Take 5 mLs by mouth 3 (three) times daily as needed for cough (Can cause drowsiness, do NOT drive while taking).  Dispense: 120 mL; Refill: 0  2. Influenza A (H1N1) - sending Tamiflu - must get 2 doses in today to have best affect, also sending Tessalon pearles and cough syrup, advised on use & SE of all meds. Drink plenty of fluids, continue Mucines, Ibuprofen prn.  - benzonatate (TESSALON) 200 MG capsule; Take 1 capsule (200 mg total) by mouth 3 (three) times daily as needed for up to 10 days for cough.  Dispense: 30 capsule; Refill: 0 - oseltamivir (TAMIFLU) 75 MG capsule; Take 1 capsule (75 mg total) by mouth 2 (two) times daily.  Dispense: 10 capsule; Refill: 0 - guaiFENesin-codeine (CHERATUSSIN AC) 100-10 MG/5ML syrup; Take 5 mLs by mouth 3 (three) times daily as needed for cough (Can cause drowsiness, do NOT drive while taking).  Dispense: 120 mL; Refill: 0    Subjective:    Outpatient Medications Prior to Visit  Medication Sig Dispense Refill   buPROPion  (WELLBUTRIN) 100 MG tablet Take 1 tablet by mouth twice daily 180 tablet 1   fluticasone (FLONASE) 50 MCG/ACT nasal spray Place 2 sprays into both nostrils daily.     hydrochlorothiazide (HYDRODIURIL) 12.5 MG tablet Take 1 tablet (12.5 mg total) by mouth in the morning. 90 tablet 1   levonorgestrel (MIRENA) 20 MCG/24HR IUD 1 each by Intrauterine route once.     liraglutide (VICTOZA) 18 MG/3ML SOPN START 1.'2MG'$  SUBCUTANEOUSLY EVERY DAY AND THEN INCREASE TO 1.'8MG'$  AS TOLERATED 6 mL 0   metFORMIN (GLUCOPHAGE XR) 500 MG 24 hr tablet Start '500mg'$  PO qpm. 90 tablet 0   Multiple Vitamin (MULTIVITAMIN) tablet Take 1 tablet by mouth daily.     NEEDLE, DISP, 30 G 30G X 1/2" MISC Inject 1 each as directed daily. 50 each 0   pantoprazole (PROTONIX) 20 MG tablet Take 1 tablet (20 mg total) by mouth daily. 90 tablet 1   Vitamin D, Ergocalciferol, (DRISDOL) 1.25 MG (50000 UNIT) CAPS capsule Take 50,000 Units by mouth once a week.     Facility-Administered Medications Prior to Visit  Medication Dose Route Frequency Provider Last Rate Last Admin   sodium chloride flush (NS) 0.9 % injection 10 mL  10 mL Intravenous PRN Brunetta Genera, MD   10 mL at 04/16/17 1938   Past Medical History:  Diagnosis Date   Adenopathy 01/10/2017  Upper abdominal. Per CT   Anemia    Anxiety    Bacterial pharyngitis 07/30/2016   Counseling regarding advanced care planning and goals of care 06/24/2017   Depression    Difficult intravenous access    Family history of adverse reaction to anesthesia    pt's mother has hx. of post-op N/V and hard to wake up post-op   History of panic attacks    02/08/17- has not had a panic attacks   Lymphoma ([place]) 01/2017   Mesenteric lymphadenopathy 01/2017   Morbid obesity with BMI of 40.0-44.9, adult ([place])    Need for immunization against influenza 05/31/2021   Runny nose 02/19/2017   clear drainage, per pt.   Seasonal allergies    Past Surgical History:  Procedure Laterality Date    LYMPH NODE BIOPSY N/A 02/11/2017   Procedure: HAND ASSISTED LAPAROSCOPIC LYMPH NODE BIOPSY;  Surgeon: Stark Klein, MD;  Location: [facility];  Service: General;  Laterality: N/A;   PORT-A-CATH REMOVAL N/A 09/04/2017   Procedure: REMOVAL PORT-A-CATH;  Surgeon: Stark Klein, MD;  Location: [facility];  Service: General;  Laterality: N/A;   PORTACATH PLACEMENT N/A 02/20/2017   Procedure: INSERTION PORT-A-CATH;  Surgeon: Stark Klein, MD;  Location: [facility];  Service: General;  Laterality: N/A;   TUBAL LIGATION     UMBILICAL HERNIA REPAIR  02/11/2017   Allergies  Allergen Reactions   Clindamycin/Lincomycin Swelling    FACIAL SWELLING   Morphine    Cymbalta [Duloxetine Hcl] Nausea Only    Causes pt to have nausea and stomach cramping.      Objective:    Physical Exam Vitals and nursing note reviewed.  Constitutional:      Appearance: Normal appearance. She is ill-appearing.     Interventions: Face mask in place.  HENT:     Right Ear: Tympanic membrane and ear canal normal.     Left Ear: Tympanic membrane and ear canal normal.     Nose:     Right Sinus: No frontal sinus tenderness.     Left Sinus: No frontal sinus tenderness.     Mouth/Throat:     Mouth: Mucous membranes are moist.     Pharynx: Posterior oropharyngeal erythema present. No pharyngeal swelling, oropharyngeal exudate or uvula swelling.     Tonsils: No tonsillar exudate or tonsillar abscesses.  Cardiovascular:     Rate and Rhythm: Normal rate and regular rhythm.  Pulmonary:     Effort: Pulmonary effort is normal.     Breath sounds: Normal breath sounds.  Musculoskeletal:        General: Normal range of motion.  Lymphadenopathy:     Head:     Right side of head: No preauricular or posterior auricular adenopathy.     Left side of head: No preauricular or posterior auricular adenopathy.     Cervical: No cervical adenopathy.  Skin:    General: Skin is warm and dry.  Neurological:     Mental Status: She is  alert.  Psychiatric:        Mood and Affect: Mood normal.        Behavior: Behavior normal.    BP 119/82 (BP Location: Left Arm, Patient Position: Sitting, Cuff Size: Large)   Pulse (!) 117   Temp 98.1 F (36.7 C) (Temporal)   Ht '5\' 3"'$  (1.6 m)   Wt 232 lb (105.2 kg)   LMP  (LMP Unknown)   SpO2 98%   BMI 41.10 kg/m  Wt Readings from  Last 3 Encounters:  06/13/22 232 lb (105.2 kg)  05/30/22 238 lb 2 oz (108 kg)  05/07/22 239 lb 9.6 oz (108.7 kg)       Jeanie Sewer, NP

## 2022-06-21 ENCOUNTER — Encounter: Payer: Self-pay | Admitting: Family

## 2022-06-21 DIAGNOSIS — R110 Nausea: Secondary | ICD-10-CM

## 2022-06-22 ENCOUNTER — Other Ambulatory Visit: Payer: Self-pay | Admitting: Family

## 2022-06-22 DIAGNOSIS — E119 Type 2 diabetes mellitus without complications: Secondary | ICD-10-CM

## 2022-06-22 MED ORDER — METFORMIN HCL ER 500 MG PO TB24: ORAL_TABLET | ORAL | 0 refills | Status: DC

## 2022-06-22 MED ORDER — ONDANSETRON HCL 4 MG PO TABS: 4.0000 mg | ORAL_TABLET | Freq: Three times a day (TID) | ORAL | 1 refills | Status: DC | PRN

## 2022-06-22 MED ORDER — VICTOZA 18 MG/3ML ~~LOC~~ SOPN: PEN_INJECTOR | SUBCUTANEOUS | 0 refills | Status: DC

## 2022-06-22 MED ORDER — VITAMIN D (ERGOCALCIFEROL) 1.25 MG (50000 UNIT) PO CAPS: 50000.0000 [IU] | ORAL_CAPSULE | ORAL | 0 refills | Status: DC

## 2022-07-26 ENCOUNTER — Other Ambulatory Visit: Payer: Self-pay

## 2022-07-26 DIAGNOSIS — C8108 Nodular lymphocyte predominant Hodgkin lymphoma, lymph nodes of multiple sites: Secondary | ICD-10-CM

## 2022-07-27 ENCOUNTER — Other Ambulatory Visit: Payer: Self-pay

## 2022-07-27 ENCOUNTER — Inpatient Hospital Stay: Payer: 59

## 2022-07-27 ENCOUNTER — Inpatient Hospital Stay: Payer: 59 | Attending: Hematology | Admitting: Hematology

## 2022-07-27 VITALS — BP 118/73 | HR 98 | Temp 97.9°F | Resp 17 | Wt 231.1 lb

## 2022-07-27 DIAGNOSIS — Z8249 Family history of ischemic heart disease and other diseases of the circulatory system: Secondary | ICD-10-CM | POA: Diagnosis not present

## 2022-07-27 DIAGNOSIS — E860 Dehydration: Secondary | ICD-10-CM | POA: Diagnosis not present

## 2022-07-27 DIAGNOSIS — Z79899 Other long term (current) drug therapy: Secondary | ICD-10-CM | POA: Diagnosis not present

## 2022-07-27 DIAGNOSIS — Z833 Family history of diabetes mellitus: Secondary | ICD-10-CM | POA: Diagnosis not present

## 2022-07-27 DIAGNOSIS — Z8379 Family history of other diseases of the digestive system: Secondary | ICD-10-CM | POA: Diagnosis not present

## 2022-07-27 DIAGNOSIS — F419 Anxiety disorder, unspecified: Secondary | ICD-10-CM | POA: Insufficient documentation

## 2022-07-27 DIAGNOSIS — C8108 Nodular lymphocyte predominant Hodgkin lymphoma, lymph nodes of multiple sites: Secondary | ICD-10-CM | POA: Diagnosis not present

## 2022-07-27 DIAGNOSIS — Z806 Family history of leukemia: Secondary | ICD-10-CM | POA: Diagnosis not present

## 2022-07-27 DIAGNOSIS — Z803 Family history of malignant neoplasm of breast: Secondary | ICD-10-CM | POA: Insufficient documentation

## 2022-07-27 DIAGNOSIS — Z8269 Family history of other diseases of the musculoskeletal system and connective tissue: Secondary | ICD-10-CM | POA: Insufficient documentation

## 2022-07-27 LAB — CMP (CANCER CENTER ONLY)
ALT: 8 U/L (ref 0–44)
AST: 13 U/L — ABNORMAL LOW (ref 15–41)
Albumin: 4.3 g/dL (ref 3.5–5.0)
Alkaline Phosphatase: 42 U/L (ref 38–126)
Anion gap: 6 (ref 5–15)
BUN: 13 mg/dL (ref 6–20)
CO2: 31 mmol/L (ref 22–32)
Calcium: 10 mg/dL (ref 8.9–10.3)
Chloride: 102 mmol/L (ref 98–111)
Creatinine: 1.09 mg/dL — ABNORMAL HIGH (ref 0.44–1.00)
GFR, Estimated: >60 mL/min (ref >60)
Glucose, Bld: 79 mg/dL (ref 70–99)
Potassium: 4.1 mmol/L (ref 3.5–5.1)
Sodium: 139 mmol/L (ref 135–145)
Total Bilirubin: 0.5 mg/dL (ref 0.3–1.2)
Total Protein: 6.8 g/dL (ref 6.5–8.1)

## 2022-07-27 LAB — CBC WITH DIFFERENTIAL (CANCER CENTER ONLY)
Abs Immature Granulocytes: 0 10*3/uL (ref 0.00–0.07)
Basophils Absolute: 0 10*3/uL (ref 0.0–0.1)
Basophils Relative: 0 %
Eosinophils Absolute: 0 10*3/uL (ref 0.0–0.5)
Eosinophils Relative: 1 %
HCT: 42.7 % (ref 36.0–46.0)
Hemoglobin: 13.9 g/dL (ref 12.0–15.0)
Immature Granulocytes: 0 %
Lymphocytes Relative: 25 %
Lymphs Abs: 1.3 10*3/uL (ref 0.7–4.0)
MCH: 26 pg (ref 26.0–34.0)
MCHC: 32.6 g/dL (ref 30.0–36.0)
MCV: 79.8 fL — ABNORMAL LOW (ref 80.0–100.0)
Monocytes Absolute: 0.4 10*3/uL (ref 0.1–1.0)
Monocytes Relative: 8 %
Neutro Abs: 3.5 10*3/uL (ref 1.7–7.7)
Neutrophils Relative %: 66 %
Platelet Count: 260 10*3/uL (ref 150–400)
RBC: 5.35 MIL/uL — ABNORMAL HIGH (ref 3.87–5.11)
RDW: 13.6 % (ref 11.5–15.5)
WBC Count: 5.4 10*3/uL (ref 4.0–10.5)
nRBC: 0 % (ref 0.0–0.2)

## 2022-07-27 LAB — LACTATE DEHYDROGENASE: LDH: 104 U/L (ref 98–192)

## 2022-07-27 NOTE — Progress Notes (Signed)
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  PREVIOUS THERAPY:    R-CHOP x 6 cycles (Fanale et al 2010)   INTERVAL HISTORY:  Patient is seen in follow-up for her nodular lymphocyte predominant Hodgkin's lymphoma.  Patient was last seen by me on 01/23/22 and reported that her anxiety had been better controlled and that she was doing well overall with no new medical concerns.  Today, she complains of fatigue and overall lack of energy that began a few weeks ago. She occasionally experiences a lack of appetite, which occurred more frequently in January 2024 vs December 2023. She regularly consumes 64 oz of water daily. She reports sleeping well but is tired upon awakening.   She reports that she tested positive for Influenza A on 12/20. She endorsed a frequent cough during the time and endorsed a lingering cough for a while afterwards. She had not received the influenza vaccination.  She previously endorsed leg cramps, but this has been improved with her increase in water intake. Her stool has been loser recently, which she attributes to metformin.  She previously complained of medial stomach pain. She describes a raised superficial area in the abdomin with movement. It was previously though to be either a hernia or separation of muscle.  She does not regularly check her sugars at home, but reports normal levels at clinics. Her level in clinic is 79 and she is slightly dehydrated.  She reports a decrease in appetite which she attributes to a medication which she has been on for a couple of months. She has lost 30 pounds over the course of 6 months,  but her weight has been stable recently. Her weight in clinic today is 231 pounds. Her appetite has since normalized.   Shew complains of a change in her taste buds. Her vitamins and typical food choices have suddenly become distasteful to her. She does not regularly consume fruits and vegetables. She reports that her periods are still discontinued.   MEDICAL HISTORY:  Past Medical History:  Diagnosis Date   Adenopathy 01/10/2017   Upper abdominal. Per CT   Anemia    Anxiety    Bacterial pharyngitis 07/30/2016   Counseling regarding advanced care planning and goals of care 06/24/2017   Depression    Difficult intravenous access    Family history of adverse reaction to anesthesia    pt's mother has hx. of post-op N/V and hard to wake up post-op   History of panic attacks    02/08/17- has not had a panic attacks   Lymphoma ([facility]) 01/2017   Mesenteric lymphadenopathy 01/2017   Morbid obesity with BMI of 40.0-44.9, adult ([facility])    Need for immunization against influenza 05/31/2021   Runny nose 02/19/2017   clear drainage, per pt.   Seasonal allergies     SURGICAL HISTORY: Past Surgical History:  Procedure Laterality Date   LYMPH NODE BIOPSY N/A 02/11/2017   Procedure: HAND ASSISTED LAPAROSCOPIC LYMPH NODE BIOPSY;  Surgeon: Almond Lint, MD;  Location[facility];  Service: General;  Laterality: N/A;   PORT-A-CATH REMOVAL N/A 09/04/2017   Procedure: REMOVAL PORT-A-CATH;  Surgeon: Almond Lint, MD;  Location: [facility];  Service: General;  Laterality:  N/A;   PORTACATH PLACEMENT N/A 02/20/2017   Procedure: INSERTION PORT-A-CATH;  Surgeon: Almond Lint, MD;  Location: [facility];  Service: General;  Laterality: N/A;   TUBAL LIGATION     UMBILICAL HERNIA REPAIR  02/11/2017    SOCIAL HISTORY: Social History   Socioeconomic History   Marital status: Married    Spouse name: Chrissie Noa   Number of children: 2   Years of education: Not on file   Highest education level: Associate  degree: occupational, Scientist, product/process development, or vocational program  Occupational History   Not on file  Tobacco Use   Smoking status: Never   Smokeless tobacco: Never  Vaping Use   Vaping Use: Never used  Substance and Sexual Activity   Alcohol use: No    Alcohol/week: 0.0 standard drinks of alcohol   Drug use: No   Sexual activity: Yes    Partners: Male    Birth control/protection: I.U.D.    Comment: tubal ligation  Other Topics Concern   Not on file  Social History Narrative   Not on file   Social Determinants of Health   Financial Resource Strain: Low Risk  (09/03/2017)   Overall Financial Resource Strain (CARDIA)    Difficulty of Paying Living Expenses: Not hard at all  Food Insecurity: No Food Insecurity (09/03/2017)   Hunger Vital Sign    Worried About Running Out of Food in the Last Year: Never true    Ran Out of Food in the Last Year: Never true  Transportation Needs: No Transportation Needs (09/03/2017)   PRAPARE - Administrator, Civil Service (Medical): No    Lack of Transportation (Non-Medical): No  Physical Activity: Inactive (09/03/2017)   Exercise Vital Sign    Days of Exercise per Week: 0 days    Minutes of Exercise per Session: 0 min  Stress: Stress Concern Present (09/03/2017)   [employer] of Occupational Health - Occupational Stress Questionnaire    Feeling of Stress : Rather much  Social Connections: Somewhat Isolated (09/03/2017)   Social Connection and Isolation Panel [NHANES]    Frequency of Communication with Friends and Family: More than three times a week    Frequency of Social Gatherings with Friends and Family: Never    Attends Religious Services: Never    Database administrator or Organizations: No    Attends Banker Meetings: Never    Marital Status: Married  Catering manager Violence: Not At Risk (09/03/2017)   Humiliation, Afraid, Rape, and Kick questionnaire    Fear of Current or Ex-Partner: No    Emotionally Abused: No     Physically Abused: No    Sexually Abused: No    FAMILY HISTORY: Family History  Problem Relation Age of Onset   Cancer Mother    Arthritis Mother    Breast cancer Mother 10   Fibromyalgia Mother    Rheum arthritis Mother    Neuropathy Mother    Anesthesia problems Mother        post-op N/V; hard to wake up post-op   Early death Father    Diabetes Father    COPD Father    Alcohol abuse Father    Congestive Heart Failure Father        died at age 73   Cirrhosis Father    Other Brother        Benign spinal tumor   Heart disease Maternal Grandmother    Diabetes Maternal Grandmother    Cancer Paternal  Grandfather        Leukemia    ALLERGIES:  is allergic to clindamycin/lincomycin, morphine, and cymbalta [duloxetine hcl].  MEDICATIONS:  Current Outpatient Medications  Medication Sig Dispense Refill   buPROPion (WELLBUTRIN) 100 MG tablet Take 1 tablet by mouth twice daily 180 tablet 1   fluticasone (FLONASE) 50 MCG/ACT nasal spray Place 2 sprays into both nostrils daily.     guaiFENesin-codeine (CHERATUSSIN AC) 100-10 MG/5ML syrup Take 5 mLs by mouth 3 (three) times daily as needed for cough (Can cause drowsiness, do NOT drive while taking). 120 mL 0   hydrochlorothiazide (HYDRODIURIL) 12.5 MG tablet Take 1 tablet (12.5 mg total) by mouth in the morning. 90 tablet 1   levonorgestrel (MIRENA) 20 MCG/24HR IUD 1 each by Intrauterine route once.     liraglutide (VICTOZA) 18 MG/3ML SOPN START 1.2MG  SUBCUTANEOUSLY EVERY DAY AND THEN INCREASE TO 1.8MG  AS TOLERATED 6 mL 0   metFORMIN (GLUCOPHAGE XR) 500 MG 24 hr tablet Start 500mg  PO qpm. 90 tablet 0   Multiple Vitamin (MULTIVITAMIN) tablet Take 1 tablet by mouth daily.     NEEDLE, DISP, 30 G 30G X 1/2" MISC Inject 1 each as directed daily. 50 each 0   ondansetron (ZOFRAN) 4 MG tablet Take 1 tablet (4 mg total) by mouth every 8 (eight) hours as needed for nausea or vomiting. 20 tablet 1   oseltamivir (TAMIFLU) 75 MG capsule Take 1  capsule (75 mg total) by mouth 2 (two) times daily. 10 capsule 0   pantoprazole (PROTONIX) 20 MG tablet Take 1 tablet (20 mg total) by mouth daily. 90 tablet 1   Vitamin D, Ergocalciferol, (DRISDOL) 1.25 MG (50000 UNIT) CAPS capsule Take 1 capsule (50,000 Units total) by mouth once a week. 5 capsule 0   No current facility-administered medications for this visit.   Facility-Administered Medications Ordered in Other Visits  Medication Dose Route Frequency Provider Last Rate Last Admin   sodium chloride flush (NS) 0.9 % injection 10 mL  10 mL Intravenous PRN Johney Maine, MD   10 mL at 04/16/17 1938    REVIEW OF SYSTEMS:   10 Point review of Systems was done is negative except as noted above.   This is a PHYSICAL EXAMINATION:  .BP 118/73 (BP Location: Left Arm, Patient Position: Sitting)   Pulse 98   Temp 97.9 F (36.6 C) (Temporal)   Resp 17   Wt 231 lb 1.6 oz (104.8 kg)   SpO2 100%   BMI 40.94 kg/m   GENERAL:alert, in no acute distress and comfortable SKIN: no acute rashes, no significant lesions EYES: conjunctiva are pink and non-injected, sclera anicteric OROPHARYNX: MMM, no exudates, no oropharyngeal erythema or ulceration NECK: supple, no JVD LYMPH:  no palpable lymphadenopathy in the cervical, axillary or inguinal regions LUNGS: clear to auscultation b/l with normal respiratory effort HEART: regular rate & rhythm ABDOMEN:  normoactive bowel sounds , non tender, not distended. Extremity: no pedal edema PSYCH: alert & oriented x 3 with fluent speech NEURO: no focal motor/sensory deficits   LABORATORY DATA:  I have reviewed the data as listed     Latest Ref Rng & Units 07/27/2022   12:53 PM 01/23/2022    2:13 PM 05/30/2021   12:44 PM  CBC  WBC 4.0 - 10.5 K/uL 5.4  5.6  5.3   Hemoglobin 12.0 - 15.0 g/dL 40.9  81.1  91.4   Hematocrit 36.0 - 46.0 % 42.7  43.6  43.0   Platelets 150 -  400 K/uL 260  255  232       Latest Ref Rng & Units 07/27/2022   12:53 PM  05/30/2022    4:37 PM 01/23/2022    2:13 PM  CMP  Glucose 70 - 99 mg/dL 79  92  161   BUN 6 - 20 mg/dL 13  12  15    Creatinine 0.44 - 1.00 mg/dL 0.96  0.45  4.09   Sodium 135 - 145 mmol/L 139  142  138   Potassium 3.5 - 5.1 mmol/L 4.1  4.8  3.9   Chloride 98 - 111 mmol/L 102  101  100   CO2 22 - 32 mmol/L 31  31  30    Calcium 8.9 - 10.3 mg/dL 81.1  91.4  9.8   Total Protein 6.5 - 8.1 g/dL 6.8   7.5   Total Bilirubin 0.3 - 1.2 mg/dL 0.5   0.4   Alkaline Phos 38 - 126 U/L 42   44   AST 15 - 41 U/L 13   16   ALT 0 - 44 U/L 8   11    . Lab Results  Component Value Date   LDH 104 07/27/2022    Lymphnode biopsy, 02/11/2017 Diagnosis Lymph node for lymphoma, Mesenteric Lymphadenopathy - HODGKIN LYMPHOMA.         PROCEDURES  ECHO 03/13/17 Study Conclusions  - Left ventricle: The cavity size was normal. Systolic function was  normal. The estimated ejection fraction was in the range of 55%  to 60%. Wall motion was normal; there were no regional wall  motion abnormalities. Left ventricular diastolic function  parameters were normal. - Atrial septum: No defect or patent foramen ovale was identified. - Impressions: Normal GLS -20.2.   RADIOGRAPHIC STUDIES: I have personally reviewed the radiological images as listed and agreed with the findings in the report. No results found.    ASSESSMENT & PLAN:   42 y.o. 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 Previously this was Deauville 5.  S/p R-CHOP x 6 cycles  PET/CT 08/06/2017: No evidence active lymphoma with minimal activity within the remaining periaortic and mesenteric lymph nodes -  Deauville 2  2) Epigastric abdominal discomfort Significantly improved with PPI therapy.  PLAN -Discussed lab results on 07/27/22  with patient. CBC showed WBC of 5.4 K, hemoglobin of 13.9, and platelets of 260 K. -microcytic slightly. Red cells slightly small in size. Iron supplements or iron rich foods recommended. -CMP stable, LDH normal. -recommended nutrient rich foods including foods and vegetable. Recommend vitamin B complex and sunlight exposure -recommend exercise to strengthen core muscles including abdominus and rectus -pt would not like to repeat scans at this time but would like to be clinically watched.  - no lab, or clinical evidence of HL progression at this time.  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  All of the patient's questions were answered with apparent satisfaction. The patient knows to call the clinic with any problems, questions or concerns.   Wyvonnia Lora MD MS AAHIVMS [facility] Hematology/Oncology Physician [facility]  .*Total Encounter Time as defined by the Centers for Medicare and Medicaid Services includes, in addition to the face-to-face time  of a patient visit (documented in the note above) non-face-to-face time: obtaining and reviewing outside history, ordering and reviewing medications, tests or procedures, care coordination (communications with other health care professionals or caregivers) and documentation in the medical record.   I,Mitra Faeizi,acting as a Neurosurgeon for Wyvonnia Lora, MD.,have documented all relevant documentation on the behalf of Wyvonnia Lora, MD,as directed by  Wyvonnia Lora, MD while in the presence of Wyvonnia Lora, MD.  .I have reviewed the above documentation for accuracy and completeness, and I agree with the above. Johney Maine MD

## 2022-07-30 ENCOUNTER — Telehealth: Payer: Self-pay | Admitting: Hematology

## 2022-07-30 NOTE — Telephone Encounter (Signed)
Called patient per 2/2 los notes to schedule f/u. Patient scheduled and notified.

## 2022-08-02 ENCOUNTER — Encounter: Payer: Self-pay | Admitting: Hematology

## 2022-08-15 ENCOUNTER — Encounter: Payer: Self-pay | Admitting: Family

## 2022-08-15 ENCOUNTER — Encounter: Payer: 59 | Admitting: Family

## 2022-08-15 ENCOUNTER — Ambulatory Visit (INDEPENDENT_AMBULATORY_CARE_PROVIDER_SITE_OTHER): Payer: 59 | Admitting: Family

## 2022-08-15 VITALS — BP 105/76 | HR 86 | Temp 98.1°F | Ht 63.0 in | Wt 236.6 lb

## 2022-08-15 DIAGNOSIS — M7989 Other specified soft tissue disorders: Secondary | ICD-10-CM

## 2022-08-15 DIAGNOSIS — E559 Vitamin D deficiency, unspecified: Secondary | ICD-10-CM

## 2022-08-15 DIAGNOSIS — E119 Type 2 diabetes mellitus without complications: Secondary | ICD-10-CM | POA: Diagnosis not present

## 2022-08-15 DIAGNOSIS — Z0000 Encounter for general adult medical examination without abnormal findings: Secondary | ICD-10-CM | POA: Diagnosis not present

## 2022-08-15 LAB — TSH: TSH: 1.14 u[IU]/mL (ref 0.35–5.50)

## 2022-08-15 LAB — VITAMIN D 25 HYDROXY (VIT D DEFICIENCY, FRACTURES): VITD: 32.51 ng/mL (ref 30.00–100.00)

## 2022-08-15 MED ORDER — PHENTERMINE HCL 37.5 MG PO CAPS: 37.5000 mg | ORAL_CAPSULE | ORAL | 2 refills | Status: DC

## 2022-08-15 MED ORDER — PHENTERMINE HCL 37.5 MG PO TABS: 18.7500 mg | ORAL_TABLET | Freq: Every day | ORAL | 0 refills | Status: DC

## 2022-08-15 MED ORDER — VICTOZA 18 MG/3ML ~~LOC~~ SOPN: PEN_INJECTOR | SUBCUTANEOUS | 0 refills | Status: DC

## 2022-08-15 NOTE — Patient Instructions (Addendum)
It was very nice to see you today!   I will review your lab results via MyChart in a few days.  I have sent Phentermine to help with your weight loss. Remember to take just 1/2 pill every morning. Use a pill splitter from the pharmacy. Continue Victoza daily. Remember to watch your portion size, reduce carbs, no sweets other than whole fruit. DRINK AT LEAST 8 cups of water every day!  Schedule a 1 month follow up visit.   Have a great rest of the week!     PLEASE NOTE:  If you had any lab tests please let us know if you have not heard back within a few days. You may see your results on MyChart before we have a chance to review them but we will give you a call once they are reviewed by [place]. If we ordered any referrals today, please let us know if you have not heard from their office within the next week.

## 2022-08-15 NOTE — Progress Notes (Signed)
Phone [phone]  Subjective:   Patient is a 42 y.o. female presenting for annual physical.    Chief Complaint  Patient presents with  . Annual Exam    Fasting w/ Labs  . Leg Pain    Pt c/o leg cramps off and on for about a month and gets worse at night. Pt stopped the hydrochlorothiazide and leg cramps aren't as often.     See problem oriented charting- ROS- full  review of systems was completed and negative except for: ***noted in HPI above.  The following were reviewed and entered/updated in epic: Past Medical History:  Diagnosis Date  . Adenopathy 01/10/2017   Upper abdominal. Per CT  . Anemia   . Anxiety   . Bacterial pharyngitis 07/30/2016  . Counseling regarding advanced care planning and goals of care 06/24/2017  . Depression   . Difficult intravenous access   . Family history of adverse reaction to anesthesia    pt's mother has hx. of post-op N/V and hard to wake up post-op  . History of panic attacks    02/08/17- has not had a panic attacks  . Lymphoma ([place]) 01/2017  . Mesenteric lymphadenopathy 01/2017  . Morbid obesity with BMI of 40.0-44.9, adult ([place])   . Need for immunization against influenza 05/31/2021  . Runny nose 02/19/2017   clear drainage, per pt.  . Seasonal allergies    Patient Active Problem List   Diagnosis Date Noted  . Reflux gastritis 11/15/2021  . Foot swelling 07/05/2021  . Generalized anxiety disorder 07/05/2021  . New onset type 2 diabetes mellitus ([place]) 06/08/2021  . Morbid obesity ([place]) 05/31/2021  . Right upper quadrant abdominal pain 05/31/2021  . Migraine 09/03/2017  . Counseling regarding advanced care planning and goals of care 06/24/2017  . 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 Mesenteric lymphadenopathy 02/11/2017  . 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 Abnormal chest CT 01/07/2017  . History of panic attacks   . Obesity, Class II, BMI 35-39.9, isolated 04/25/2015    Past Surgical History:  Procedure Laterality Date  . LYMPH NODE BIOPSY N/A 02/11/2017   Procedure: HAND ASSISTED LAPAROSCOPIC LYMPH NODE BIOPSY;  Surgeon: Stark Klein, MD;  Location: [facility];  Service: General;  Laterality: N/A;  . PORT-A-CATH REMOVAL N/A 09/04/2017   Procedure: REMOVAL PORT-A-CATH;  Surgeon: Stark Klein, MD;  Location: [facility];  Service: General;  Laterality: N/A;  . PORTACATH PLACEMENT N/A 02/20/2017   Procedure: INSERTION PORT-A-CATH;  Surgeon: Stark Klein, MD;  Location: [facility];  Service: General;  Laterality: N/A;  . TUBAL LIGATION    . UMBILICAL HERNIA REPAIR  02/11/2017    Family History  Problem Relation Age of Onset  . Cancer Mother   . Arthritis Mother   . Breast cancer Mother 38  . Fibromyalgia Mother   . Rheum arthritis Mother   . Neuropathy Mother   . Anesthesia problems Mother        post-op N/V; hard to wake up post-op  . Early death Father   . Diabetes Father   . COPD Father   . Alcohol abuse Father   . Congestive Heart Failure Father        died at age 24  . Cirrhosis Father   . Other Brother        Benign spinal tumor  . Heart disease Maternal Grandmother   . Diabetes Maternal Grandmother   . Cancer Paternal Grandfather  Leukemia    Medications- reviewed and updated Current Outpatient Medications  Medication Sig Dispense Refill  . buPROPion (WELLBUTRIN) 100 MG tablet Take 1 tablet by mouth twice daily 180 tablet 1  . fluticasone (FLONASE) 50 MCG/ACT nasal spray Place 2 sprays into both nostrils daily.    Marland Kitchen levonorgestrel (MIRENA) 20 MCG/24HR IUD 1 each by Intrauterine route once.    . liraglutide (VICTOZA) 18 MG/3ML SOPN START 1.2MG SUBCUTANEOUSLY EVERY DAY AND THEN INCREASE TO 1.8MG AS TOLERATED 6 mL 0  . metFORMIN (GLUCOPHAGE XR) 500 MG 24 hr tablet Start 572m PO qpm. 90 tablet 0  . Multiple Vitamin (MULTIVITAMIN) tablet Take 1 tablet by mouth daily.    .Marland KitchenNEEDLE, DISP, 30 G 30G X 1/2" MISC Inject 1 each as  directed daily. 50 each 0  . ondansetron (ZOFRAN) 4 MG tablet Take 1 tablet (4 mg total) by mouth every 8 (eight) hours as needed for nausea or vomiting. 20 tablet 1  . pantoprazole (PROTONIX) 20 MG tablet Take 1 tablet (20 mg total) by mouth daily. 90 tablet 1  . Vitamin D, Ergocalciferol, (DRISDOL) 1.25 MG (50000 UNIT) CAPS capsule Take 1 capsule (50,000 Units total) by mouth once a week. 5 capsule 0  . hydrochlorothiazide (HYDRODIURIL) 12.5 MG tablet Take 1 tablet (12.5 mg total) by mouth in the morning. (Patient not taking: Reported on 08/15/2022) 90 tablet 1   No current facility-administered medications for this visit.   Facility-Administered Medications Ordered in Other Visits  Medication Dose Route Frequency Provider Last Rate Last Admin  . sodium chloride flush (NS) 0.9 % injection 10 mL  10 mL Intravenous PRN KBrunetta Genera MD   10 mL at 04/16/17 1938    Allergies-reviewed and updated Allergies  Allergen Reactions  . Clindamycin/Lincomycin Swelling    FACIAL SWELLING  . Morphine   . Cymbalta [Duloxetine Hcl] Nausea Only    Causes pt to have nausea and stomach cramping.    Social History   Social History Narrative  . Not on file    Objective:  BP 105/76 (BP Location: Left Arm, Patient Position: Sitting, Cuff Size: Large)   Pulse 86   Temp 98.1 F (36.7 C) (Temporal)   Ht 5' 3"$  (1.6 m)   Wt 236 lb 9.6 oz (107.3 kg)   SpO2 98%   BMI 41.91 kg/m  Physical Exam Vitals and nursing note reviewed.  Constitutional:      Appearance: Normal appearance.  HENT:     Head: Normocephalic.     Right Ear: Tympanic membrane normal.     Left Ear: Tympanic membrane normal.     Nose: Nose normal.     Mouth/Throat:     Mouth: Mucous membranes are moist.  Eyes:     Pupils: Pupils are equal, round, and reactive to light.  Cardiovascular:     Rate and Rhythm: Normal rate and regular rhythm.  Pulmonary:     Effort: Pulmonary effort is normal.     Breath sounds: Normal  breath sounds.  Musculoskeletal:        General: Normal range of motion.     Cervical back: Normal range of motion.  Lymphadenopathy:     Cervical: No cervical adenopathy.  Skin:    General: Skin is warm and dry.  Neurological:     Mental Status: She is alert.  Psychiatric:        Mood and Affect: Mood normal.        Behavior: Behavior normal.  Assessment and Plan   Health Maintenance counseling: 1. Anticipatory guidance: Patient counseled regarding regular dental exams q6 months, eye exams,  avoiding smoking and second hand smoke, limiting alcohol to 1 beverage per day, no illicit drugs.   2. Risk factor reduction:  Advised patient of need for regular exercise and diet rich with fruits and vegetables to reduce risk of heart attack and stroke. Exercise- ***.  Wt Readings from Last 3 Encounters:  08/15/22 236 lb 9.6 oz (107.3 kg)  07/27/22 231 lb 1.6 oz (104.8 kg)  06/13/22 232 lb (105.2 kg)   3. Immunizations/screenings/ancillary studies Immunization History  Administered Date(s) Administered  . Influenza,inj,Quad PF,6+ Mos 06/06/2018, 05/31/2021  . PFIZER(Purple Top)SARS-COV-2 Vaccination 02/07/2020, 02/26/2020, 02/01/2021  . Tdap 04/25/2015   Health Maintenance Due  Topic Date Due  . FOOT EXAM  Never done  . OPHTHALMOLOGY EXAM  Never done  . Diabetic kidney evaluation - Urine ACR  Never done  . PAP SMEAR-Modifier  07/26/2017  . HEMOGLOBIN A1C  01/02/2022    4. Cervical cancer screening- *** 5. Breast cancer screening-  mammogram *** 6. Colon cancer screening - *** 7. Skin cancer screening- advised regular sunscreen use. Denies worrisome, changing, or new skin lesions.  8. Birth control/STD check- *** 9. Osteoporosis screening- *** 10. Alcohol screening: *** 11. Smoking associated screening (lung cancer screening, AAA screen 65-75, UA)- *** smoker- ***ppd  Problem List Items Addressed This Visit   None   Recommended follow up: ***No follow-ups on  file. Future Appointments  Date Time Provider [place]  01/25/2023  1:30 PM [facility] None  01/25/2023  2:00 PM Brunetta Genera, MD [facility] None    Lab/Order associations:fasting   Jeanie Sewer, NP

## 2022-08-16 LAB — LIPID PANEL
Cholesterol: 188 mg/dL (ref 0–200)
HDL: 49.8 mg/dL (ref >39.00)
LDL Cholesterol: 111 mg/dL — ABNORMAL HIGH (ref 0–99)
NonHDL: 138.68
Total CHOL/HDL Ratio: 4
Triglycerides: 139 mg/dL (ref 0.0–149.0)
VLDL: 27.8 mg/dL (ref 0.0–40.0)

## 2022-08-16 MED ORDER — HYDROCHLOROTHIAZIDE 12.5 MG PO TABS: 12.5000 mg | ORAL_TABLET | Freq: Every day | ORAL | 1 refills | Status: DC | PRN

## 2022-08-16 NOTE — Assessment & Plan Note (Signed)
chronic had been taking HCTZ 12.3m qd, which helped swelling but caused leg cramps pt has hard time getting in enough water qd advised ok to take prn, if start swelling take for 3 days the stop encouraged water intake, exercise f/u prn

## 2022-08-16 NOTE — Assessment & Plan Note (Signed)
tolerating Metformin & Victoza, up to 1.88m qd states the Metformin helps keep her from being constipated, so will continue for now, though her A1C in good range has regained some weight, states she no longer feels the Victoza is controlling her hunger adding low dose phentermine f/u 1 month.

## 2022-08-16 NOTE — Assessment & Plan Note (Signed)
chronic, unstable pt has regained about 5lbs since being on max dose of Victoza adding phentermine 37.68m, advised to start with 1/2 pill for first month along with Victoza, advised on use & SE, pt has taken med in past Wt. Loss strategies reviewed including portion control, less carbs including sweets, eating most of calories earlier in day, drinking 64oz water qd, and establishing daily exercise routine.  f/u 1 mo

## 2022-08-17 ENCOUNTER — Encounter: Payer: Self-pay | Admitting: Family

## 2022-08-20 ENCOUNTER — Encounter: Payer: Self-pay | Admitting: Family

## 2022-08-20 NOTE — Progress Notes (Signed)
Your glucose, electrolytes, thyroid, liver & kidney function are all normal. Also your Vitamin D level is good, but on the low end, so start taking an over the counter Vitamin D3 up to 5,000units daily.  Your total cholesterol numbers are good & LDL (bad #) is just slightly high. Try to reduce any fried foods, alcohol, nonnutritional snacks e.g. chips/cookies,pies, cakes and candies, fatty meat (red meat), high fat dairy foods:  including cheese, milk, ice cream.  Increase fruits/vegetables/fiber.   Continue or restart an exercise routine, shooting for 17mn 5-7days per week.   I forgot to ask you about  getting a mammogram done (don't see where you have had in the past?) and also when was your last PAP smear?

## 2022-08-22 ENCOUNTER — Encounter: Payer: Self-pay | Admitting: Family

## 2022-09-03 ENCOUNTER — Encounter: Payer: Self-pay | Admitting: Hematology

## 2022-09-14 ENCOUNTER — Other Ambulatory Visit: Payer: Self-pay | Admitting: Family

## 2022-09-14 DIAGNOSIS — E119 Type 2 diabetes mellitus without complications: Secondary | ICD-10-CM

## 2022-09-17 ENCOUNTER — Other Ambulatory Visit: Payer: Self-pay | Admitting: Family

## 2022-09-18 ENCOUNTER — Other Ambulatory Visit: Payer: Self-pay | Admitting: Family

## 2022-09-18 DIAGNOSIS — E119 Type 2 diabetes mellitus without complications: Secondary | ICD-10-CM

## 2022-12-03 ENCOUNTER — Encounter: Payer: Self-pay | Admitting: Family

## 2022-12-03 ENCOUNTER — Ambulatory Visit (INDEPENDENT_AMBULATORY_CARE_PROVIDER_SITE_OTHER): Payer: 59 | Admitting: Family

## 2022-12-03 VITALS — BP 119/82 | HR 94 | Temp 97.6°F | Ht 63.0 in | Wt 234.1 lb

## 2022-12-03 DIAGNOSIS — E559 Vitamin D deficiency, unspecified: Secondary | ICD-10-CM | POA: Insufficient documentation

## 2022-12-03 DIAGNOSIS — R252 Cramp and spasm: Secondary | ICD-10-CM

## 2022-12-03 MED ORDER — CYCLOBENZAPRINE HCL 5 MG PO TABS: 5.0000 mg | ORAL_TABLET | Freq: Three times a day (TID) | ORAL | 0 refills | Status: DC | PRN

## 2022-12-03 MED ORDER — VITAMIN D3 1.25 MG (50000 UT) PO CAPS: 1.0000 | ORAL_CAPSULE | ORAL | 1 refills | Status: DC

## 2022-12-03 NOTE — Assessment & Plan Note (Addendum)
chronic - started 06/2021 w/swelling taking HCTZ 12.5mg  prn, reports cramping even on days not taking med electrolytes wnl 07/2022 spasms in foot,  curls under, hard to relax  reports hydrating better up to 2L per day advised to avoid sports drinks sending generic Flexeril 5-10mg  tid prn, advised on use & SE advised to try OTC magnesium glycinate, taurate or L-threonate, as preventative, start low dose & gradually titrate up to 800mg  per day without causing diarrhea if above not helping, can try OTC potassium (99mg ) qd f/u 1 mo or prn

## 2022-12-03 NOTE — Assessment & Plan Note (Addendum)
chronic, stable last checked 07/2022, level normal but low while taking high dose Vit D2 RX, ran out 2 weeks prior advised to take OTC Vit. D3, pt did not start sending RX Vit. D3 today recheck labs in 3-77mos

## 2022-12-03 NOTE — Assessment & Plan Note (Signed)
chronic had been taking HCTZ 12.5mg qd, which helped swelling but caused leg cramps pt has hard time getting in enough water qd advised ok to take prn, if start swelling take for 3 days the stop encouraged water intake, exercise f/u prn 

## 2022-12-03 NOTE — Progress Notes (Signed)
Patient ID: Bonnie Larsen, female    DOB: July 25, 1980, 42 y.o.   MRN: [id number]  Chief Complaint  Patient presents with   Leg Pain    Pt c/o bilateral leg and foot bending/cramping randomly and more often, usually at night. Friday night pt states she was on the step ladder and her left foot bent/leg cramped up after 20 mins it was getting better. Has tried drinking more water and Gatorade.     HPI: Bilateral leg/foot cramps:  HX:  over last few months and happens mostly in evenings, but also during day some, denies any more increased swelling than usual, reduced the HCTZ 12.5mg  to prn use which seemed to help the cramps, but they are happening more often even on days she is not taking the med. Denies any increased sodium in diet, or eating more take-out foods. * Today Pt reports she has been taking the HCTZ just prn, but still having leg cramps day & night, reports her foot will spasm and curl under, she feels it is getting worse.   Assessment & Plan:  Leg cramping Assessment & Plan: chronic - started 06/2021 w/swelling taking HCTZ 12.5mg  prn, reports cramping even on days not taking med electrolytes wnl 07/2022 spasms in foot,  curls under, hard to relax  reports hydrating better up to 2L per day advised to avoid sports drinks sending generic Flexeril 5-10mg  tid prn, advised on use & SE advised to try OTC magnesium glycinate, taurate or L-threonate, as preventative, start low dose & gradually titrate up to 800mg  per day without causing diarrhea if above not helping, can try OTC potassium (99mg ) qd f/u 1 mo or prn  Orders: -     Cyclobenzaprine HCl; Take 1-2 tablets (5-10 mg total) by mouth 3 (three) times daily as needed for muscle spasms (Leg cramps).  Dispense: 60 tablet; Refill: 0  Vitamin D deficiency Assessment & Plan: chronic, stable last checked 07/2022, level normal but low while taking high dose Vit D2 RX, ran out 2 weeks prior advised to take OTC Vit. D3, pt did not  start sending RX Vit. D3 today recheck labs in 3-72mos  Orders: -     Vitamin D3; Take 1 capsule (1.25 mg total) by mouth once a week.  Dispense: 12 capsule; Refill: 1   Subjective:    Outpatient Medications Prior to Visit  Medication Sig Dispense Refill   buPROPion (WELLBUTRIN) 100 MG tablet Take 1 tablet by mouth twice daily 180 tablet 1   fluticasone (FLONASE) 50 MCG/ACT nasal spray Place 2 sprays into both nostrils daily.     hydrochlorothiazide (HYDRODIURIL) 12.5 MG tablet Take 1 tablet (12.5 mg total) by mouth daily as needed. 90 tablet 1   levonorgestrel (MIRENA) 20 MCG/24HR IUD 1 each by Intrauterine route once.     liraglutide (VICTOZA) 18 MG/3ML SOPN INJECT 1.8 MG SUBCUTANEOUSLY  ONCE DAILY 6 mL 0   metFORMIN (GLUCOPHAGE-XR) 500 MG 24 hr tablet Take 1 tablet by mouth in the evening 90 tablet 0   Multiple Vitamin (MULTIVITAMIN) tablet Take 1 tablet by mouth daily.     NEEDLE, DISP, 30 G 30G X 1/2" MISC Inject 1 each as directed daily. 50 each 0   ondansetron (ZOFRAN) 4 MG tablet Take 1 tablet (4 mg total) by mouth every 8 (eight) hours as needed for nausea or vomiting. 20 tablet 1   pantoprazole (PROTONIX) 20 MG tablet Take 1 tablet (20 mg total) by mouth daily. 90 tablet 1  phentermine (ADIPEX-P) 37.5 MG tablet Take 0.5 tablets (18.75 mg total) by mouth daily before breakfast. 30 tablet 0   Vitamin D, Ergocalciferol, (DRISDOL) 1.25 MG (50000 UNIT) CAPS capsule Take 1 capsule (50,000 Units total) by mouth once a week. 5 capsule 0   Facility-Administered Medications Prior to Visit  Medication Dose Route Frequency Provider Last Rate Last Admin   sodium chloride flush (NS) 0.9 % injection 10 mL  10 mL Intravenous PRN Johney Maine, MD   10 mL at 04/16/17 1938   Past Medical History:  Diagnosis Date   Adenopathy 01/10/2017   Upper abdominal. Per CT   Anemia    Anxiety    Bacterial pharyngitis 07/30/2016   Counseling regarding advanced care planning and goals of care  06/24/2017   Depression    Difficult intravenous access    Family history of adverse reaction to anesthesia    pt's mother has hx. 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   Seasonal allergies    Past Surgical History:  Procedure Laterality Date   LYMPH NODE BIOPSY N/A 02/11/2017   Procedure: HAND ASSISTED LAPAROSCOPIC LYMPH NODE BIOPSY;  Surgeon: Almond Lint, MD;  [facility];  Service: General;  Laterality: N/A;   PORT-A-CATH REMOVAL N/A 09/04/2017   Procedure: REMOVAL PORT-A-CATH;  Surgeon: Almond Lint, MD;  Location[facility];  Service: General;  Laterality: N/A;   PORTACATH PLACEMENT N/A 02/20/2017   Procedure: INSERTION PORT-A-CATH;  Surgeon: Almond Lint, MD;  Location: [facility];  Service: General;  Laterality: N/A;   TUBAL LIGATION     UMBILICAL HERNIA REPAIR  02/11/2017   Allergies  Allergen Reactions   Clindamycin/Lincomycin Swelling    FACIAL SWELLING   Morphine    Cymbalta [Duloxetine Hcl] Nausea Only    Causes pt to have nausea and stomach cramping.      Objective:    Physical Exam Vitals and nursing note reviewed.  Constitutional:      Appearance: Normal appearance.  Cardiovascular:     Rate and Rhythm: Normal rate and regular rhythm.  Pulmonary:     Effort: Pulmonary effort is normal.     Breath sounds: Normal breath sounds.  Musculoskeletal:        General: Normal range of motion.     Right lower leg: No tenderness or bony tenderness. No edema.     Left lower leg: No tenderness or bony tenderness. No edema.     Right ankle: No swelling.     Left ankle: No swelling.     Right foot: No swelling.      Left foot: No swelling.  Skin:    General: Skin is warm and dry.  Neurological:     Mental Status: She is alert.  Psychiatric:        Mood and Affect: Mood normal.        Behavior: Behavior normal.    BP 119/82 (BP Location: Left Arm, Patient Position: Sitting, Cuff Size: Large)   Pulse 94   Temp 97.6 F (36.4 C) (Temporal)   Ht 5\' 3"  (1.6 m)   Wt  234 lb 2 oz (106.2 kg)   SpO2 99%   BMI 41.47 kg/m  Wt Readings from Last 3 Encounters:  12/03/22 234 lb 2 oz (106.2 kg)  08/15/22 236 lb 9.6 oz (107.3 kg)  07/27/22 231 lb 1.6 oz (104.8 kg)      Dulce Sellar, NP

## 2022-12-06 ENCOUNTER — Ambulatory Visit: Payer: 59 | Admitting: Family

## 2022-12-07 ENCOUNTER — Telehealth: Payer: Self-pay | Admitting: Family

## 2022-12-07 NOTE — Telephone Encounter (Signed)
Patient states she is supposed to be referred to [facility]ist  Requests to be advised

## 2022-12-09 NOTE — Telephone Encounter (Signed)
I was holding off as we discussed trying OTC magnesium, potassium, and I sent in Flexeril (muscle relaxer) to help with the pain. Based on her symptoms, this is a muscular problem, not a vascular one. If still not getting relief with the above after 1-2 weeks, then I would refer to Ortho. Remind her also to drink at least 2 liters = 8 cups of water daily. Thx

## 2022-12-11 ENCOUNTER — Telehealth: Payer: Self-pay | Admitting: Family

## 2022-12-11 NOTE — Telephone Encounter (Signed)
Patient states she was advised at [facility] on 12/03/22  a Referral to [facility] V[facility] would be placed/ordered.  Patient requests to be called for status of Referral

## 2022-12-11 NOTE — Telephone Encounter (Signed)
I Called pt in regards, pt gave a verbalized understanding and stated she will keep [place] updated.

## 2022-12-13 ENCOUNTER — Other Ambulatory Visit: Payer: Self-pay | Admitting: Family

## 2022-12-13 DIAGNOSIS — E119 Type 2 diabetes mellitus without complications: Secondary | ICD-10-CM

## 2022-12-19 ENCOUNTER — Telehealth: Payer: Self-pay | Admitting: Hematology

## 2022-12-28 ENCOUNTER — Encounter: Payer: Self-pay | Admitting: Family

## 2022-12-28 DIAGNOSIS — M79604 Pain in right leg: Secondary | ICD-10-CM

## 2023-01-13 ENCOUNTER — Other Ambulatory Visit: Payer: Self-pay | Admitting: Family

## 2023-01-13 DIAGNOSIS — K2960 Other gastritis without bleeding: Secondary | ICD-10-CM

## 2023-01-13 DIAGNOSIS — E119 Type 2 diabetes mellitus without complications: Secondary | ICD-10-CM

## 2023-01-15 ENCOUNTER — Other Ambulatory Visit: Payer: Self-pay | Admitting: Family

## 2023-01-15 DIAGNOSIS — R252 Cramp and spasm: Secondary | ICD-10-CM

## 2023-01-17 ENCOUNTER — Encounter: Payer: Self-pay | Admitting: Family

## 2023-01-18 ENCOUNTER — Encounter: Payer: Self-pay | Admitting: Family

## 2023-01-18 ENCOUNTER — Other Ambulatory Visit (HOSPITAL_BASED_OUTPATIENT_CLINIC_OR_DEPARTMENT_OTHER): Payer: Self-pay

## 2023-01-18 ENCOUNTER — Ambulatory Visit (INDEPENDENT_AMBULATORY_CARE_PROVIDER_SITE_OTHER): Payer: 59 | Admitting: Family

## 2023-01-18 ENCOUNTER — Encounter: Payer: Self-pay | Admitting: Hematology

## 2023-01-18 VITALS — BP 107/73 | HR 111 | Temp 97.5°F | Ht 63.0 in | Wt 243.2 lb

## 2023-01-18 DIAGNOSIS — F411 Generalized anxiety disorder: Secondary | ICD-10-CM | POA: Diagnosis not present

## 2023-01-18 DIAGNOSIS — R252 Cramp and spasm: Secondary | ICD-10-CM

## 2023-01-18 DIAGNOSIS — R7303 Prediabetes: Secondary | ICD-10-CM | POA: Diagnosis not present

## 2023-01-18 MED ORDER — CYCLOBENZAPRINE HCL 5 MG PO TABS
5.0000 mg | ORAL_TABLET | Freq: Two times a day (BID) | ORAL | 2 refills | Status: DC
Start: 2023-01-18 — End: 2023-07-09
  Filled 2023-01-18 – 2023-02-02 (×3): qty 60, 30d supply, fill #0
  Filled 2023-03-23: qty 60, 30d supply, fill #1
  Filled 2023-05-03: qty 60, 30d supply, fill #2

## 2023-01-18 MED ORDER — OZEMPIC (0.25 OR 0.5 MG/DOSE) 2 MG/3ML ~~LOC~~ SOPN
0.5000 mg | PEN_INJECTOR | SUBCUTANEOUS | 0 refills | Status: DC
Start: 2023-01-18 — End: 2023-02-17
  Filled 2023-01-18: qty 3, 28d supply, fill #0

## 2023-01-18 MED ORDER — VORTIOXETINE HBR 5 MG PO TABS
5.0000 mg | ORAL_TABLET | Freq: Every day | ORAL | 1 refills | Status: DC
Start: 2023-01-18 — End: 2023-02-08
  Filled 2023-01-18 – 2023-02-02 (×3): qty 30, 30d supply, fill #0

## 2023-01-18 NOTE — Assessment & Plan Note (Addendum)
chronic - started 06/2021 w/swelling low dose hydrochlorothiazide only prn taking generic Flexeril 5-10mg  bid, pt reports this was helping until last weekend she had been standing for long time & sx worsened also taking OTC magnesium & potassium (99mg ) every day, and wearing compression socks when working [employer] on [employer] about persistent sx and she reduced Bupropion & stopped which only helped a little with cramping ordered vascular studies - scheduled for August 14 & 15, advised to get on call back list continue POC for now f/u 3mos or prn

## 2023-01-18 NOTE — Assessment & Plan Note (Signed)
Chronic, unstable, had to stop Bupropion due to leg cramping avoiding other SNRI - may have same SE, SSRIs d/t possible sexual SE or weight gain, pt weight is distressing to her starting Trintellix 5mg  daily, advised on use & SE f/u 1-2 mo or prn

## 2023-01-18 NOTE — Telephone Encounter (Signed)
please call and schedule Bonnie Larsen for a visit to address above concerns, thx

## 2023-01-18 NOTE — Progress Notes (Signed)
Patient ID: Bonnie Larsen, female    DOB: 08-17-1980, 42 y.o.   MRN: [id number]  Chief Complaint  Patient presents with   Weight Loss   Anxiety    Discuss medications   Leg Pain    HPI: T2DM: Pt is currently maintained on the following medications for diabetes: Metformin, Victoza. Failed meds include: none. Denies polyuria/polydipsia. Denies hypoglycemia Home glucose readings range: not checking at home. Last A1C 5.9. pt has lost 25lbs. Anxiety/Depression: Patient complains of anxiety symptoms.   She has the following symptoms: irritable, palpitations, racing thoughts, sweating.  Onset of symptoms was approximately  months ago, She denies current suicidal and homicidal ideation. Possible organic causes contributing are: none.  Risk factors: negative life event cancer. Pt started on Wellbutrin and Vistaril prn last visit, doing much better, feeling more social, less anxiety. *Stopped Wellbutrin d/t possibly causing leg cramps. Bilateral leg/foot cramps:  HX:  over last few months and happens mostly in evenings, but also during day some, denies any more increased swelling than usual, reduced the HCTZ 12.5mg  to prn use which seemed to help the cramps, but they are happening more often even on days she is not taking the med. Denies any increased sodium in diet, or eating more take-out foods. Last visit pt reported she has been taking the HCTZ just prn, but still having leg cramps day & night, reported her foot will spasm and curl under, she felt it was getting worse.  *Today, she stopped the Bupropion and feels like this did help reduce the amount of cramping, but still having bad muscle spasms. States the Flexeril has helped taking in afternoon after work and then at bedtime.    Assessment & Plan:  Stage 2 type 1 prediabetes Assessment & Plan: tolerating Metformin & Victoza, up to 1.8mg  qd however, she is inconsistent with the Victoza due to requiring a PA w/every refill switching to Ozempic  (only tried Victoza first), but also will ask about why repeated PA is needed. has regained some weight sending 0.5 mg Ozempic, advised on use & SE. f/u 3 month.   Orders: -     Ozempic (0.25 or 0.5 MG/DOSE); Inject 0.5 mg into the skin once a week.  Dispense: 3 mL; Refill: 0  Generalized anxiety disorder Assessment & Plan: Chronic, unstable, had to stop Bupropion due to leg cramping avoiding other SNRI - may have same SE, SSRIs d/t possible sexual SE or weight gain, pt weight is distressing to her starting Trintellix 5mg  daily, advised on use & SE f/u 1-2 mo or prn  Orders: -     Vortioxetine HBr; Take 1 tablet (5 mg total) by mouth daily.  Dispense: 30 tablet; Refill: 1  Leg cramping Assessment & Plan: chronic - started 06/2021 w/swelling low dose hydrochlorothiazide only prn taking generic Flexeril 5-10mg  bid, pt reports this was helping until last weekend she had been standing for long time & sx worsened also taking OTC magnesium & potassium (99mg ) every day, and wearing compression socks when working [employer] on [employer] about persistent sx and she reduced Bupropion & stopped which only helped a little with cramping ordered vascular studies - scheduled for August 14 & 15, advised to get on call back list continue POC for now f/u 3mos or prn  Orders: -     Cyclobenzaprine HCl; Take 1 tablet (5 mg total) by mouth in the morning and at bedtime. For leg cramping.  Dispense: 60 tablet; Refill: 2  Morbid obesity (HCC) -  Ozempic (0.25 or 0.5 MG/DOSE); Inject 0.5 mg into the skin once a week.  Dispense: 3 mL; Refill: 0   Subjective:    Outpatient Medications Prior to Visit  Medication Sig Dispense Refill   Cholecalciferol (VITAMIN D3) 1.25 MG (50000 UT) CAPS Take 1 capsule (1.25 mg total) by mouth once a week. 12 capsule 1   fluticasone (FLONASE) 50 MCG/ACT nasal spray Place 2 sprays into both nostrils daily.     hydrochlorothiazide (HYDRODIURIL) 12.5 MG tablet Take 1 tablet  (12.5 mg total) by mouth daily as needed. 90 tablet 1   levonorgestrel (MIRENA) 20 MCG/24HR IUD 1 each by Intrauterine route once.     MAGNESIUM PO Take by mouth.     metFORMIN (GLUCOPHAGE-XR) 500 MG 24 hr tablet Take 1 tablet by mouth in the evening 90 tablet 0   Multiple Vitamin (MULTIVITAMIN) tablet Take 1 tablet by mouth daily.     NEEDLE, DISP, 30 G 30G X 1/2" MISC Inject 1 each as directed daily. 50 each 0   ondansetron (ZOFRAN) 4 MG tablet Take 1 tablet (4 mg total) by mouth every 8 (eight) hours as needed for nausea or vomiting. 20 tablet 1   pantoprazole (PROTONIX) 20 MG tablet TAKE 1 TABLET BY MOUTH TWICE DAILY FOR  1  WEEK,  THEN  1  TABLET  EVERY  MORNING 60 tablet 0   POTASSIUM PO Take by mouth.     cyclobenzaprine (FLEXERIL) 5 MG tablet TAKE 1 TO 2 TABLETS BY MOUTH THREE TIMES DAILY AS NEEDED FOR MUSCLE SPASM (LEG  CRAMPS). 60 tablet 0   liraglutide (VICTOZA) 18 MG/3ML SOPN INJECT 1.8MG  SUBCUTANEOUSLY ONCE DAILY 6 mL 0   buPROPion (WELLBUTRIN) 100 MG tablet Take 1 tablet by mouth twice daily (Patient not taking: Reported on 01/18/2023) 180 tablet 1   phentermine (ADIPEX-P) 37.5 MG tablet Take 0.5 tablets (18.75 mg total) by mouth daily before breakfast. (Patient not taking: Reported on 01/18/2023) 30 tablet 0   Facility-Administered Medications Prior to Visit  Medication Dose Route Frequency Provider Last Rate Last Admin   sodium chloride flush (NS) 0.9 % injection 10 mL  10 mL Intravenous PRN Johney Maine, MD   10 mL at 04/16/17 1938   Past Medical History:  Diagnosis Date   Adenopathy 01/10/2017   Upper abdominal. Per CT   Anemia    Anxiety    Bacterial pharyngitis 07/30/2016   Counseling regarding advanced care planning and goals of care 06/24/2017   Depression    Difficult intravenous access    Family history of adverse reaction to anesthesia    pt's mother has hx. of post-op N/V and hard to wake up post-op   History of panic attacks    02/08/17- has not had a  panic attacks   Lymphoma (HCC) 01/2017   Mesenteric lymphadenopathy 01/2017   Morbid obesity with BMI of 40.0-44.9, adult (HCC)    Need for immunization against influenza 05/31/2021   Obesity, Class II, BMI 35-39.9, isolated 04/25/2015   Runny nose 02/19/2017   clear drainage, per pt.   Seasonal allergies    Past Surgical History:  Procedure Laterality Date   LYMPH NODE BIOPSY N/A 02/11/2017   Procedure: HAND ASSISTED LAPAROSCOPIC LYMPH NODE BIOPSY;  Surgeon: Almond Lint, MD;  Location: [facility];  Service: General;  Laterality: N/A;   PORT-A-CATH REMOVAL N/A 09/04/2017   Procedure: REMOVAL PORT-A-CATH;  Surgeon: Almond Lint, MD;  Location: [facility] OR;  Service: General;  Laterality: N/A;   PORTACATH  PLACEMENT N/A 02/20/2017   Procedure: INSERTION PORT-A-CATH;  Surgeon: Almond Lint, MD;  Location: [facility];  Service: General;  Laterality: N/A;   TUBAL LIGATION     UMBILICAL HERNIA REPAIR  02/11/2017   Allergies  Allergen Reactions   Clindamycin/Lincomycin Swelling    FACIAL SWELLING   Morphine    Cymbalta [Duloxetine Hcl] Nausea Only    Causes pt to have nausea and stomach cramping.      Objective:    Physical Exam Vitals and nursing note reviewed.  Constitutional:      Appearance: Normal appearance. She is morbidly obese.  Cardiovascular:     Rate and Rhythm: Normal rate and regular rhythm.  Pulmonary:     Effort: Pulmonary effort is normal.     Breath sounds: Normal breath sounds.  Musculoskeletal:        General: Normal range of motion.     Right lower leg: 1+ Edema present.     Left lower leg: 1+ Edema present.  Skin:    General: Skin is warm and dry.  Neurological:     Mental Status: She is alert.  Psychiatric:        Mood and Affect: Mood normal.        Behavior: Behavior normal.    BP 107/73 (BP Location: Left Arm, Patient Position: Sitting, Cuff Size: Large)   Pulse (!) 111   Temp (!) 97.5 F (36.4 C) (Temporal)   Ht 5\' 3"  (1.6 m)   Wt 243  lb 3.2 oz (110.3 kg)   SpO2 100%   BMI 43.08 kg/m  Wt Readings from Last 3 Encounters:  01/18/23 243 lb 3.2 oz (110.3 kg)  12/03/22 234 lb 2 oz (106.2 kg)  08/15/22 236 lb 9.6 oz (107.3 kg)       Dulce Sellar, NP

## 2023-01-18 NOTE — Assessment & Plan Note (Signed)
tolerating Metformin & Victoza, up to 1.8mg  qd however, she is inconsistent with the Victoza due to requiring a PA w/every refill switching to Ozempic (only tried Victoza first), but also will ask about why repeated PA is needed. has regained some weight sending 0.5 mg Ozempic, advised on use & SE. f/u 3 month.

## 2023-01-18 NOTE — Telephone Encounter (Signed)
Pt scheduled for 7/26 @ 1:40 pm.

## 2023-01-21 ENCOUNTER — Other Ambulatory Visit (HOSPITAL_COMMUNITY): Payer: Self-pay

## 2023-01-21 ENCOUNTER — Telehealth: Payer: Self-pay

## 2023-01-21 NOTE — Telephone Encounter (Signed)
Pharmacy Patient Advocate Encounter   Received notification from Physician's Office that prior authorization for Trintellix 5MG  tablets is required/requested.   Insurance verification completed.   The patient is insured through [facility] .   Per test claim: PA required; PA submitted to [facility] via CoverMyMeds Key/confirmation #/EOC [id number] Status is pending

## 2023-01-22 ENCOUNTER — Other Ambulatory Visit (HOSPITAL_COMMUNITY): Payer: Self-pay

## 2023-01-22 ENCOUNTER — Encounter: Payer: Self-pay | Admitting: *Deleted

## 2023-01-22 NOTE — Telephone Encounter (Signed)
Pharmacy Patient Advocate Encounter  Received notification from [facility] that Prior Authorization for Trintellix (formerly Brintellix) 5MG  tablets has been APPROVED from 01/22/23 to 01/22/24. Ran test claim, Copay is $463.62 (Deductible not met)  PA #/Case ID/Reference #: : [id number]

## 2023-01-22 NOTE — Telephone Encounter (Signed)
PA resubmitted with additional documentation. New key: [id number]

## 2023-01-22 NOTE — Telephone Encounter (Signed)
Patient notified of message below.

## 2023-01-22 NOTE — Telephone Encounter (Signed)
Pharmacy Patient Advocate Encounter  Received notification from [facility] that Prior Authorization for Trintellix (formerly Brintellix) 5MG  tablets has been DENIED. Please advise how you'd like to proceed. Full denial letter will be uploaded to the media tab. See denial reason below.  PA #/Case ID/Reference #: [id number]

## 2023-01-25 ENCOUNTER — Other Ambulatory Visit: Payer: 59

## 2023-01-25 ENCOUNTER — Ambulatory Visit: Payer: 59 | Admitting: Hematology

## 2023-01-30 ENCOUNTER — Ambulatory Visit (HOSPITAL_COMMUNITY): Payer: 59

## 2023-02-01 ENCOUNTER — Other Ambulatory Visit: Payer: Self-pay

## 2023-02-01 ENCOUNTER — Other Ambulatory Visit (HOSPITAL_BASED_OUTPATIENT_CLINIC_OR_DEPARTMENT_OTHER): Payer: Self-pay

## 2023-02-02 ENCOUNTER — Encounter: Payer: Self-pay | Admitting: Family

## 2023-02-02 ENCOUNTER — Other Ambulatory Visit (HOSPITAL_BASED_OUTPATIENT_CLINIC_OR_DEPARTMENT_OTHER): Payer: Self-pay

## 2023-02-02 DIAGNOSIS — F411 Generalized anxiety disorder: Secondary | ICD-10-CM

## 2023-02-04 ENCOUNTER — Other Ambulatory Visit: Payer: Self-pay | Admitting: Family

## 2023-02-04 ENCOUNTER — Ambulatory Visit (INDEPENDENT_AMBULATORY_CARE_PROVIDER_SITE_OTHER)
Admission: RE | Admit: 2023-02-04 | Discharge: 2023-02-04 | Disposition: A | Discharge status: Home / Self Care | Payer: 59 | Source: Ambulatory Visit | Attending: Family | Admitting: Family

## 2023-02-04 ENCOUNTER — Other Ambulatory Visit (HOSPITAL_BASED_OUTPATIENT_CLINIC_OR_DEPARTMENT_OTHER): Payer: Self-pay

## 2023-02-04 ENCOUNTER — Ambulatory Visit (HOSPITAL_COMMUNITY)
Admission: RE | Admit: 2023-02-04 | Discharge: 2023-02-04 | Disposition: A | Discharge status: Home / Self Care | Payer: 59 | Source: Ambulatory Visit | Attending: Family | Admitting: Family

## 2023-02-04 DIAGNOSIS — M79605 Pain in left leg: Secondary | ICD-10-CM | POA: Diagnosis not present

## 2023-02-04 DIAGNOSIS — M79604 Pain in right leg: Secondary | ICD-10-CM

## 2023-02-04 LAB — VAS US ABI WITH/WO TBI
Left ABI: 1.1
Right ABI: 1.17

## 2023-02-05 ENCOUNTER — Ambulatory Visit (HOSPITAL_COMMUNITY): Payer: 59

## 2023-02-08 MED ORDER — VENLAFAXINE HCL ER 37.5 MG PO CP24
37.5000 mg | ORAL_CAPSULE | Freq: Every day | ORAL | 2 refills | Status: DC
Start: 2023-02-08 — End: 2023-04-01

## 2023-02-10 ENCOUNTER — Other Ambulatory Visit: Payer: Self-pay | Admitting: Family

## 2023-02-10 DIAGNOSIS — K2960 Other gastritis without bleeding: Secondary | ICD-10-CM

## 2023-02-15 ENCOUNTER — Other Ambulatory Visit: Payer: 59

## 2023-02-15 ENCOUNTER — Ambulatory Visit: Payer: 59 | Admitting: Hematology

## 2023-02-17 ENCOUNTER — Other Ambulatory Visit: Payer: Self-pay | Admitting: Family

## 2023-02-17 DIAGNOSIS — R7303 Prediabetes: Secondary | ICD-10-CM

## 2023-02-18 ENCOUNTER — Other Ambulatory Visit (HOSPITAL_BASED_OUTPATIENT_CLINIC_OR_DEPARTMENT_OTHER): Payer: Self-pay

## 2023-02-18 MED ORDER — OZEMPIC (0.25 OR 0.5 MG/DOSE) 2 MG/3ML ~~LOC~~ SOPN
0.5000 mg | PEN_INJECTOR | SUBCUTANEOUS | 0 refills | Status: AC
Start: 2023-02-18 — End: ?
  Filled 2023-02-18: qty 3, 28d supply, fill #0

## 2023-03-14 ENCOUNTER — Other Ambulatory Visit: Payer: Self-pay | Admitting: Family

## 2023-03-14 DIAGNOSIS — E119 Type 2 diabetes mellitus without complications: Secondary | ICD-10-CM

## 2023-03-15 ENCOUNTER — Other Ambulatory Visit: Payer: Self-pay

## 2023-03-15 DIAGNOSIS — C8108 Nodular lymphocyte predominant Hodgkin lymphoma, lymph nodes of multiple sites: Secondary | ICD-10-CM

## 2023-03-18 ENCOUNTER — Inpatient Hospital Stay: Payer: 59 | Attending: Hematology

## 2023-03-18 ENCOUNTER — Inpatient Hospital Stay (HOSPITAL_BASED_OUTPATIENT_CLINIC_OR_DEPARTMENT_OTHER): Payer: 59 | Admitting: Hematology

## 2023-03-18 VITALS — BP 119/82 | HR 105 | Temp 97.9°F | Resp 18 | Ht 63.0 in | Wt 229.1 lb

## 2023-03-18 DIAGNOSIS — C8108 Nodular lymphocyte predominant Hodgkin lymphoma, lymph nodes of multiple sites: Secondary | ICD-10-CM | POA: Diagnosis not present

## 2023-03-18 DIAGNOSIS — C8100 Nodular lymphocyte predominant Hodgkin lymphoma, unspecified site: Secondary | ICD-10-CM | POA: Insufficient documentation

## 2023-03-18 DIAGNOSIS — Z806 Family history of leukemia: Secondary | ICD-10-CM | POA: Insufficient documentation

## 2023-03-18 DIAGNOSIS — Z79899 Other long term (current) drug therapy: Secondary | ICD-10-CM | POA: Diagnosis not present

## 2023-03-18 DIAGNOSIS — Z803 Family history of malignant neoplasm of breast: Secondary | ICD-10-CM | POA: Diagnosis not present

## 2023-03-18 DIAGNOSIS — Z8379 Family history of other diseases of the digestive system: Secondary | ICD-10-CM | POA: Diagnosis not present

## 2023-03-18 DIAGNOSIS — Z888 Allergy status to other drugs, medicaments and biological substances status: Secondary | ICD-10-CM | POA: Diagnosis not present

## 2023-03-18 DIAGNOSIS — Z885 Allergy status to narcotic agent status: Secondary | ICD-10-CM | POA: Diagnosis not present

## 2023-03-18 DIAGNOSIS — R630 Anorexia: Secondary | ICD-10-CM | POA: Diagnosis not present

## 2023-03-18 DIAGNOSIS — Z809 Family history of malignant neoplasm, unspecified: Secondary | ICD-10-CM | POA: Diagnosis not present

## 2023-03-18 DIAGNOSIS — G2581 Restless legs syndrome: Secondary | ICD-10-CM | POA: Diagnosis not present

## 2023-03-18 DIAGNOSIS — Z8261 Family history of arthritis: Secondary | ICD-10-CM | POA: Diagnosis not present

## 2023-03-18 DIAGNOSIS — Z8249 Family history of ischemic heart disease and other diseases of the circulatory system: Secondary | ICD-10-CM | POA: Diagnosis not present

## 2023-03-18 DIAGNOSIS — Z8269 Family history of other diseases of the musculoskeletal system and connective tissue: Secondary | ICD-10-CM | POA: Insufficient documentation

## 2023-03-18 DIAGNOSIS — Z881 Allergy status to other antibiotic agents status: Secondary | ICD-10-CM | POA: Insufficient documentation

## 2023-03-18 DIAGNOSIS — Z833 Family history of diabetes mellitus: Secondary | ICD-10-CM | POA: Insufficient documentation

## 2023-03-18 DIAGNOSIS — R5383 Other fatigue: Secondary | ICD-10-CM | POA: Insufficient documentation

## 2023-03-18 LAB — CBC WITH DIFFERENTIAL (CANCER CENTER ONLY)
Abs Immature Granulocytes: 0.01 10*3/uL (ref 0.00–0.07)
Basophils Absolute: 0 10*3/uL (ref 0.0–0.1)
Basophils Relative: 0 %
Eosinophils Absolute: 0.1 10*3/uL (ref 0.0–0.5)
Eosinophils Relative: 1 %
HCT: 43.7 % (ref 36.0–46.0)
Hemoglobin: 14.1 g/dL (ref 12.0–15.0)
Immature Granulocytes: 0 %
Lymphocytes Relative: 26 %
Lymphs Abs: 1.4 10*3/uL (ref 0.7–4.0)
MCH: 25.7 pg — ABNORMAL LOW (ref 26.0–34.0)
MCHC: 32.3 g/dL (ref 30.0–36.0)
MCV: 79.7 fL — ABNORMAL LOW (ref 80.0–100.0)
Monocytes Absolute: 0.4 10*3/uL (ref 0.1–1.0)
Monocytes Relative: 7 %
Neutro Abs: 3.6 10*3/uL (ref 1.7–7.7)
Neutrophils Relative %: 66 %
Platelet Count: 244 10*3/uL (ref 150–400)
RBC: 5.48 MIL/uL — ABNORMAL HIGH (ref 3.87–5.11)
RDW: 13.8 % (ref 11.5–15.5)
WBC Count: 5.4 10*3/uL (ref 4.0–10.5)
nRBC: 0 % (ref 0.0–0.2)

## 2023-03-18 LAB — CMP (CANCER CENTER ONLY)
ALT: 12 U/L (ref 0–44)
AST: 15 U/L (ref 15–41)
Albumin: 4.4 g/dL (ref 3.5–5.0)
Alkaline Phosphatase: 57 U/L (ref 38–126)
Anion gap: 9 (ref 5–15)
BUN: 13 mg/dL (ref 6–20)
CO2: 27 mmol/L (ref 22–32)
Calcium: 9.6 mg/dL (ref 8.9–10.3)
Chloride: 103 mmol/L (ref 98–111)
Creatinine: 1.15 mg/dL — ABNORMAL HIGH (ref 0.44–1.00)
GFR, Estimated: >60 mL/min (ref >60)
Glucose, Bld: 100 mg/dL — ABNORMAL HIGH (ref 70–99)
Potassium: 4.1 mmol/L (ref 3.5–5.1)
Sodium: 139 mmol/L (ref 135–145)
Total Bilirubin: 0.6 mg/dL (ref 0.3–1.2)
Total Protein: 7.3 g/dL (ref 6.5–8.1)

## 2023-03-18 LAB — LACTATE DEHYDROGENASE: LDH: 114 U/L (ref 98–192)

## 2023-03-18 NOTE — Progress Notes (Signed)
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  PREVIOUS THERAPY:    R-CHOP x 6 cycles (Fanale et al 2010)   INTERVAL HISTORY:  Patient is seen in follow-up for her nodular lymphocyte predominant Hodgkin's lymphoma.  Patient was last seen by me on 07/27/2022 and she complained of fatigue, lethargy, occasional appetite loss, and taste aversion.   Patient notes she has been doing well overall since our last visit. She does complain of bilateral leg cramps. She has been following up with her PCP regarding leg cramps. She had an ultrasound, which did not show any abnormalities. Patient notes she has leg cramps couple times a week, especially at night. However, patient notes that her cramps are slowly improving after starting magnesium and potassium supplements. She has been staying well hydrated.  She denies any new infection issues, fever, SOB, abdominal pain, chest pain, back pain, abnormal bowel movement, or leg swelling. She does complain of occasional restless leg syndrome at night. Patient also notes that her hands stay cold even if it is hot outside.  She has discontinued Hydrochlorothiazide. She is still taking Protonix.  Patient notes her anxiety is well controlled with her medication.    MEDICAL HISTORY:  Past Medical History:  Diagnosis Date   Adenopathy 01/10/2017   Upper abdominal. Per CT   Anemia    Anxiety    Bacterial pharyngitis 07/30/2016   Counseling regarding advanced care planning and goals of care 06/24/2017   Depression    Difficult intravenous access    Family history of adverse reaction to anesthesia    pt's mother  has hx. 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   Seasonal allergies     SURGICAL HISTORY: Past Surgical History:  Procedure Laterality Date   LYMPH NODE BIOPSY N/A 02/11/2017   Procedure: HAND ASSISTED LAPAROSCOPIC LYMPH NODE BIOPSY;  Surgeon: Almond Lint, MD;  Location: [facility];  Service: General;  Laterality: N/A;   PORT-A-CATH REMOVAL N/A 09/04/2017   Procedure: REMOVAL PORT-A-CATH;  Surgeon: Almond Lint, MD;  Location: [facility];  Service: General;  Laterality: N/A;   PORTACATH PLACEMENT N/A 02/20/2017   Procedure: INSERTION PORT-A-CATH;  Surgeon: Almond Lint, MD;  Location: [facility];  Service: General;  Laterality: N/A;   TUBAL LIGATION     UMBILICAL HERNIA REPAIR  02/11/2017    SOCIAL HISTORY: Social History   Socioeconomic History   Marital status: Married    Spouse name: Chrissie Noa   Number of children: 2   Years of education: Not on file   Highest education level: Associate degree: occupational, Scientist, product/process development, or vocational program  Occupational History   Not on file  Tobacco Use   Smoking status: Never   Smokeless tobacco: Never  Vaping Use   Vaping status: Never Used  Substance and Sexual Activity  Alcohol use: No    Alcohol/week: 0.0 standard drinks of alcohol   Drug use: No   Sexual activity: Yes    Partners: Male    Birth control/protection: I.U.D.    Comment: tubal ligation  Other Topics Concern   Not on file  Social History Narrative   Not on file   Social Determinants of Health   Financial Resource Strain: Low Risk  (09/03/2017)   Overall Financial Resource Strain (CARDIA)    Difficulty of Paying  Living Expenses: Not hard at all  Food Insecurity: No Food Insecurity (09/03/2017)   Hunger Vital Sign    Worried About Running Out of Food in the Last Year: Never true    Ran Out of Food in the Last Year: Never true  Transportation Needs: No Transportation Needs (09/03/2017)   PRAPARE - Administrator, Civil Service (Medical): No    Lack of Transportation (Non-Medical): No  Physical Activity: Inactive (09/03/2017)   Exercise Vital Sign    Days of Exercise per Week: 0 days    Minutes of Exercise per Session: 0 min  Stress: Stress Concern Present (09/03/2017)   [employer] of Occupational Health - Occupational Stress Questionnaire    Feeling of Stress : Rather much  Social Connections: Somewhat Isolated (09/03/2017)   Social Connection and Isolation Panel [NHANES]    Frequency of Communication with Friends and Family: More than three times a week    Frequency of Social Gatherings with Friends and Family: Never    Attends Religious Services: Never    Database administrator or Organizations: No    Attends Banker Meetings: Never    Marital Status: Married  Catering manager Violence: Not At Risk (09/03/2017)   Humiliation, Afraid, Rape, and Kick questionnaire    Fear of Current or Ex-Partner: No    Emotionally Abused: No    Physically Abused: No    Sexually Abused: No    FAMILY HISTORY: Family History  Problem Relation Age of Onset   Cancer Mother    Arthritis Mother    Breast cancer Mother 24   Fibromyalgia Mother    Rheum arthritis Mother    Neuropathy Mother    Anesthesia problems Mother        post-op N/V; hard to wake up post-op   Early death Father    Diabetes Father    COPD Father    Alcohol abuse Father    Congestive Heart Failure Father        died at age 82   Cirrhosis Father    Other Brother        Benign spinal tumor   Heart disease Maternal Grandmother    Diabetes Maternal Grandmother    Cancer Paternal Grandfather         Leukemia    ALLERGIES:  is allergic to clindamycin/lincomycin, morphine, and cymbalta [duloxetine hcl].  MEDICATIONS:  Current Outpatient Medications  Medication Sig Dispense Refill   Cholecalciferol (VITAMIN D3) 1.25 MG (50000 UT) CAPS Take 1 capsule (1.25 mg total) by mouth once a week. 12 capsule 1   cyclobenzaprine (FLEXERIL) 5 MG tablet Take 1 tablet (5 mg total) by mouth in the morning and at bedtime. For leg cramping. 60 tablet 2   fluticasone (FLONASE) 50 MCG/ACT nasal spray Place 2 sprays into both nostrils daily.     hydrochlorothiazide (HYDRODIURIL) 12.5 MG tablet Take 1 tablet (12.5 mg total) by mouth daily as needed. 90 tablet 1   levonorgestrel (MIRENA)  20 MCG/24HR IUD 1 each by Intrauterine route once.     MAGNESIUM PO Take by mouth.     metFORMIN (GLUCOPHAGE-XR) 500 MG 24 hr tablet Take 1 tablet by mouth in the evening 90 tablet 0   Multiple Vitamin (MULTIVITAMIN) tablet Take 1 tablet by mouth daily.     NEEDLE, DISP, 30 G 30G X 1/2" MISC Inject 1 each as directed daily. 50 each 0   ondansetron (ZOFRAN) 4 MG tablet Take 1 tablet (4 mg total) by mouth every 8 (eight) hours as needed for nausea or vomiting. 20 tablet 1   pantoprazole (PROTONIX) 20 MG tablet TAKE 1 TABLET BY MOUTH TWICE DAILY FOR  1  WEEK,  THEN  1  TABLET  EVERY  MORNING 60 tablet 0   POTASSIUM PO Take by mouth.     Semaglutide,0.25 or 0.5MG /DOS, (OZEMPIC, 0.25 OR 0.5 MG/DOSE,) 2 MG/3ML SOPN Inject 0.5 mg into the skin once a week. 3 mL 0   venlafaxine XR (EFFEXOR XR) 37.5 MG 24 hr capsule Take 1 capsule (37.5 mg total) by mouth daily with breakfast. 30 capsule 2   No current facility-administered medications for this visit.   Facility-Administered Medications Ordered in Other Visits  Medication Dose Route Frequency Provider Last Rate Last Admin   sodium chloride flush (NS) 0.9 % injection 10 mL  10 mL Intravenous PRN Johney Maine, MD   10 mL at 04/16/17 1938    REVIEW OF SYSTEMS:   10 Point  review of Systems was done is negative except as noted above.   This is a PHYSICAL EXAMINATION:  .BP 119/82 (BP Location: Left Arm, Patient Position: Sitting)   Pulse (!) 105   Temp 97.9 F (36.6 C) (Temporal)   Resp 18   Ht 5\' 3"  (1.6 m)   Wt 229 lb 2 oz (103.9 kg)   SpO2 98%   BMI 40.59 kg/m   GENERAL:alert, in no acute distress and comfortable SKIN: no acute rashes, no significant lesions EYES: conjunctiva are pink and non-injected, sclera anicteric OROPHARYNX: MMM, no exudates, no oropharyngeal erythema or ulceration NECK: supple, no JVD LYMPH:  no palpable lymphadenopathy in the cervical, axillary or inguinal regions LUNGS: clear to auscultation b/l with normal respiratory effort HEART: regular rate & rhythm ABDOMEN:  normoactive bowel sounds , non tender, not distended. Extremity: no pedal edema PSYCH: alert & oriented x 3 with fluent speech NEURO: no focal motor/sensory deficits   LABORATORY DATA:  I have reviewed the data as listed     Latest Ref Rng & Units 07/27/2022   12:53 PM 01/23/2022    2:13 PM 05/30/2021   12:44 PM  CBC  WBC 4.0 - 10.5 K/uL 5.4  5.6  5.3   Hemoglobin 12.0 - 15.0 g/dL 59.5  63.8  75.6   Hematocrit 36.0 - 46.0 % 42.7  43.6  43.0   Platelets 150 - 400 K/uL 260  255  232       Latest Ref Rng & Units 07/27/2022   12:53 PM 05/30/2022    4:37 PM 01/23/2022    2:13 PM  CMP  Glucose 70 - 99 mg/dL 79  92  433   BUN 6 - 20 mg/dL 13  12  15    Creatinine 0.44 - 1.00 mg/dL 2.95  1.88  4.16   Sodium 135 - 145 mmol/L 139  142  138   Potassium 3.5 - 5.1 mmol/L 4.1  4.8  3.9   Chloride 98 - 111 mmol/L  102  101  100   CO2 22 - 32 mmol/L 31  31  30    Calcium 8.9 - 10.3 mg/dL 16.1  09.6  9.8   Total Protein 6.5 - 8.1 g/dL 6.8   7.5   Total Bilirubin 0.3 - 1.2 mg/dL 0.5   0.4   Alkaline Phos 38 - 126 U/L 42   44   AST 15 - 41 U/L 13   16   ALT 0 - 44 U/L 8   11    . Lab Results  Component Value Date   LDH 104 07/27/2022    Lymphnode biopsy,  02/11/2017 Diagnosis Lymph node for lymphoma, Mesenteric Lymphadenopathy - HODGKIN LYMPHOMA.         PROCEDURES  ECHO 03/13/17 Study Conclusions  - Left ventricle: The cavity size was normal. Systolic function was  normal. The estimated ejection fraction was in the range of 55%  to 60%. Wall motion was normal; there were no regional wall  motion abnormalities. Left ventricular diastolic function  parameters were normal. - Atrial septum: No defect or patent foramen ovale was identified. - Impressions: Normal GLS -20.2.   RADIOGRAPHIC STUDIES: I have personally reviewed the radiological images as listed and agreed with the findings in the report. No results found.    ASSESSMENT & PLAN:   42 y.o. 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 Previously this was Deauville 5.  S/p R-CHOP x 6 cycles  PET/CT 08/06/2017: No evidence active lymphoma with minimal activity within the remaining periaortic and mesenteric lymph nodes - Deauville 2  2) Epigastric abdominal discomfort Significantly improved with PPI therapy.  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 CBC is stable. CMP is stable LDH WNL at 114 -Start iron supplement 3 times a week for 3 months to see if it improves leg cramps.  -Goal is to keep Ferritin level above 100.  -Recommend labs with iron lab with PCP in 3 months.  -Continue Magnesium and Potassium supplements.  - no lab, or clinical evidence of HL progression at this time.   FOLLOW-UP: RTC with Dr Candise Che with labs in 12 months  The total time spent in the appointment was 23 minutes* .  All of the patient's questions were answered with apparent satisfaction. The patient knows to call the clinic with any problems, questions or concerns.   Wyvonnia Lora MD MS AAHIVMS [facility] Hematology/Oncology Physician [facility]  .*Total  Encounter Time as defined by the Centers for Medicare and Medicaid Services includes, in addition to the face-to-face time of a patient visit (documented in the note above) non-face-to-face time: obtaining and reviewing outside history, ordering and reviewing medications, tests or procedures, care coordination (communications with other health care professionals or caregivers) and documentation in the medical record.   I,Param Shah,acting as a Neurosurgeon for Wyvonnia Lora, MD.,have documented all relevant documentation on the behalf of Wyvonnia Lora, MD,as directed by  Wyvonnia Lora, MD while in the presence of Wyvonnia Lora, MD.  .I have reviewed the above documentation for accuracy and completeness, and I agree with the above. Johney Maine MD

## 2023-03-19 ENCOUNTER — Encounter: Payer: Self-pay | Admitting: Family

## 2023-03-19 ENCOUNTER — Encounter: Payer: Self-pay | Admitting: Hematology

## 2023-03-23 ENCOUNTER — Encounter: Payer: Self-pay | Admitting: Family

## 2023-03-23 ENCOUNTER — Other Ambulatory Visit: Payer: Self-pay | Admitting: Family

## 2023-03-23 ENCOUNTER — Other Ambulatory Visit (HOSPITAL_BASED_OUTPATIENT_CLINIC_OR_DEPARTMENT_OTHER): Payer: Self-pay

## 2023-03-23 DIAGNOSIS — R7303 Prediabetes: Secondary | ICD-10-CM

## 2023-03-24 ENCOUNTER — Encounter: Payer: Self-pay | Admitting: Hematology

## 2023-03-25 ENCOUNTER — Other Ambulatory Visit (HOSPITAL_BASED_OUTPATIENT_CLINIC_OR_DEPARTMENT_OTHER): Payer: Self-pay

## 2023-03-25 MED ORDER — OZEMPIC (0.25 OR 0.5 MG/DOSE) 2 MG/3ML ~~LOC~~ SOPN
0.5000 mg | PEN_INJECTOR | SUBCUTANEOUS | 0 refills | Status: DC
  Filled 2023-03-25: qty 3, 28d supply, fill #0

## 2023-03-25 NOTE — Telephone Encounter (Signed)
call pt pharmacy and ask to cancel refill sent today for Ozempic 0.5mg ,thx

## 2023-03-26 ENCOUNTER — Other Ambulatory Visit (HOSPITAL_BASED_OUTPATIENT_CLINIC_OR_DEPARTMENT_OTHER): Payer: Self-pay

## 2023-03-26 NOTE — Telephone Encounter (Signed)
please go ahead and send Ozempic 1mg  dose with 2 refills, thx

## 2023-03-28 ENCOUNTER — Ambulatory Visit (INDEPENDENT_AMBULATORY_CARE_PROVIDER_SITE_OTHER): Payer: 59 | Admitting: Family

## 2023-03-28 VITALS — BP 115/80 | HR 102 | Temp 97.8°F | Ht 63.0 in | Wt 230.0 lb

## 2023-03-28 DIAGNOSIS — R7303 Prediabetes: Secondary | ICD-10-CM

## 2023-03-28 DIAGNOSIS — R252 Cramp and spasm: Secondary | ICD-10-CM | POA: Diagnosis not present

## 2023-03-28 MED ORDER — SEMAGLUTIDE (1 MG/DOSE) 4 MG/3ML ~~LOC~~ SOPN: 1.0000 mg | PEN_INJECTOR | SUBCUTANEOUS | 2 refills | Status: DC

## 2023-03-28 NOTE — Progress Notes (Signed)
Patient ID: Bonnie Larsen, female    DOB: Dec 15, 1980, 42 y.o.   MRN: [id number]  Chief Complaint  Patient presents with   Muscle Pain   HPI: T2DM: Pt is currently maintained on the following medications for diabetes: Metformin, Victoza. Failed meds include: none. Denies polyuria/polydipsia. Denies hypoglycemia Home glucose readings range: not checking at home. Last A1C 5.9. pt has lost 25lbs.  Bilateral leg/foot cramps:  HX:  over last few months and happens mostly in evenings, but also during day some, denies any more increased swelling than usual, reduced the HCTZ 12.5mg  to prn use which seemed to help the cramps, but they are happening more often even on days she is not taking the med. Denies any increased sodium in diet, or eating more take-out foods. Last visit pt reported she has been taking the HCTZ just prn, but still having leg cramps day & night, reported her foot will spasm and curl under, she felt it was getting worse.   States the Flexeril has helped taking in afternoon after work and then at bedtime.  *Today, pt reports overall cramping does not happen as often, but still having & sometimes severe where her toes curl under and she can't straighten. Also reports having some cramping in her hands as well recently and fingers curl under and she tries to push them down.   Assessment & Plan:  Leg cramping Assessment & Plan: chronic - started 06/2021 w/swelling HCTZ stopped taking generic Flexeril 5-10mg  bid - helps OTC magnesium & potassium (99mg ) every day, and wearing compression socks when working, hydrating well recommend calf stretches and applying heat prior to bedtime oncologist rec taking iron tabs 3d/week advised to continue all the above checking inflammatory labs & autoimmune panel today f/u 3mos or prn  Orders: -     Sedimentation rate -     Vitamin B12 -     Vitamin B6 -     CK -     C-reactive protein -     Analyzer ANA IFA w/RFLX Titer/Pattern,Systemic  Autoimmune Panel 1  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   Orders: -     Semaglutide (1 MG/DOSE); Inject 1 mg as directed once a week.  Dispense: 3 mL; Refill: 2   Subjective:    Outpatient Medications Prior to Visit  Medication Sig Dispense Refill   Cholecalciferol (VITAMIN D3) 1.25 MG (50000 UT) CAPS Take 1 capsule (1.25 mg total) by mouth once a week. 12 capsule 1   cyclobenzaprine (FLEXERIL) 5 MG tablet Take 1 tablet (5 mg total) by mouth in the morning and at bedtime. For leg cramping. 60 tablet 2   fluticasone (FLONASE) 50 MCG/ACT nasal spray Place 2 sprays into both nostrils daily.     hydrochlorothiazide (HYDRODIURIL) 12.5 MG tablet Take 1 tablet (12.5 mg total) by mouth daily as needed. 90 tablet 1   levonorgestrel (MIRENA) 20 MCG/24HR IUD 1 each by Intrauterine route once.     MAGNESIUM PO Take by mouth.     metFORMIN (GLUCOPHAGE-XR) 500 MG 24 hr tablet Take 1 tablet by mouth in the evening 90 tablet 0   Multiple Vitamin (MULTIVITAMIN) tablet Take 1 tablet by mouth daily.     NEEDLE, DISP, 30 G 30G  X 1/2" MISC Inject 1 each as directed daily. 50 each 0   ondansetron (ZOFRAN) 4 MG tablet Take 1 tablet (4 mg total) by mouth every 8 (eight) hours as needed for nausea or vomiting. 20 tablet 1   pantoprazole (PROTONIX) 20 MG tablet TAKE 1 TABLET BY MOUTH TWICE DAILY FOR  1  WEEK,  THEN  1  TABLET  EVERY  MORNING 60 tablet 0   POTASSIUM PO Take by mouth.     venlafaxine XR (EFFEXOR XR) 37.5 MG 24 hr capsule Take 1 capsule (37.5 mg total) by mouth daily with breakfast. 30 capsule 2   Semaglutide,0.25 or 0.5MG /DOS, (OZEMPIC, 0.25 OR 0.5 MG/DOSE,) 2 MG/3ML SOPN Inject 0.5 mg into the skin once a week. 3 mL 0   Facility-Administered  Medications Prior to Visit  Medication Dose Route Frequency Provider Last Rate Last Admin   sodium chloride flush (NS) 0.9 % injection 10 mL  10 mL Intravenous PRN Johney Maine, MD   10 mL at 04/16/17 1938   Past Medical History:  Diagnosis Date   Adenopathy 01/10/2017   Upper abdominal. Per CT   Anemia    Anxiety    Bacterial pharyngitis 07/30/2016   Counseling regarding advanced care planning and goals of care 06/24/2017   Depression    Difficult intravenous access    Family history of adverse reaction to anesthesia    pt's mother has hx. 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   Seasonal allergies    Past Surgical History:  Procedure Laterality Date   LYMPH NODE BIOPSY N/A 02/11/2017   Procedure: HAND ASSISTED LAPAROSCOPIC LYMPH NODE BIOPSY;  Surgeon: Almond Lint, MD;  Location: [facility];  Service: General;  Laterality: N/A;   PORT-A-CATH REMOVAL N/A 09/04/2017   Procedure: REMOVAL PORT-A-CATH;  Surgeon: Almond Lint, MD;  Location: [facility] OR;  Service: General;  Laterality: N/A;   PORTACATH PLACEMENT N/A 02/20/2017   Procedure: INSERTION PORT-A-CATH;  Surgeon: Almond Lint, MD;  Location: [facility];  Service: General;  Laterality: N/A;   TUBAL LIGATION     UMBILICAL HERNIA REPAIR  02/11/2017   Allergies  Allergen Reactions   Clindamycin/Lincomycin Swelling    FACIAL SWELLING   Morphine    Cymbalta [Duloxetine Hcl] Nausea Only    Causes pt to have nausea and stomach cramping.      Objective:    Physical Exam BP 115/80 (BP Location: Left Arm, Patient Position: Sitting, Cuff Size: Large)   Pulse (!) 102   Temp  97.8 F (36.6 C) (Temporal)   Ht 5\' 3"  (1.6 m)   Wt 230 lb (104.3 kg)   SpO2 100%   BMI 40.74 kg/m  Wt Readings from Last 3 Encounters:  03/28/23 230 lb (104.3 kg)  03/18/23 229 lb 2 oz (103.9 kg)  01/18/23 243 lb 3.2 oz (110.3 kg)       Bonnie Sellar, NP

## 2023-03-28 NOTE — Assessment & Plan Note (Addendum)
chronic, stable tolerating Metformin & Ozempic 0.5mg  weight loss has leveled off again wants to increase dose just picked up 0.5mg  dose, advised ok to give herself 2 shots until pen runs out refilled 1mg  dose f/u 3 month.

## 2023-03-28 NOTE — Assessment & Plan Note (Addendum)
chronic - started 06/2021 w/swelling HCTZ stopped taking generic Flexeril 5-10mg  bid - helps OTC magnesium & potassium (99mg ) every day, and wearing compression socks when working, hydrating well recommend calf stretches and applying heat prior to bedtime oncologist rec taking iron tabs 3d/week advised to continue all the above checking inflammatory labs & autoimmune panel today f/u 3mos or prn

## 2023-03-29 LAB — SEDIMENTATION RATE: Sed Rate: 18 mm/h (ref 0–20)

## 2023-03-29 LAB — VITAMIN B12: Vitamin B-12: 237 pg/mL (ref 211–911)

## 2023-03-29 LAB — C-REACTIVE PROTEIN: CRP: <1 mg/dL (ref 0.5–20.0)

## 2023-03-29 LAB — CK: Total CK: 159 U/L (ref 7–177)

## 2023-04-01 ENCOUNTER — Other Ambulatory Visit: Payer: Self-pay | Admitting: Family

## 2023-04-01 ENCOUNTER — Other Ambulatory Visit: Payer: Self-pay

## 2023-04-01 ENCOUNTER — Other Ambulatory Visit (HOSPITAL_BASED_OUTPATIENT_CLINIC_OR_DEPARTMENT_OTHER): Payer: Self-pay

## 2023-04-01 ENCOUNTER — Encounter: Payer: Self-pay | Admitting: Family

## 2023-04-01 DIAGNOSIS — R7303 Prediabetes: Secondary | ICD-10-CM

## 2023-04-01 MED ORDER — SEMAGLUTIDE (1 MG/DOSE) 4 MG/3ML ~~LOC~~ SOPN
1.0000 mg | PEN_INJECTOR | SUBCUTANEOUS | 2 refills | Status: DC
Start: 2023-04-01 — End: 2023-06-10
  Filled 2023-04-01 – 2023-04-12 (×3): qty 3, 28d supply, fill #0
  Filled 2023-05-03 – 2023-05-12 (×3): qty 3, 28d supply, fill #1

## 2023-04-01 NOTE — Telephone Encounter (Signed)
Ozempic sent to [facility]. Advise to stop the Venlafaxine and let's hold off on all meds for now. She may need to see Psych next if ongoing issue, can discuss at her next f/u. Can send therapy referral if not already seeing someone. Let me know, thx

## 2023-04-01 NOTE — Telephone Encounter (Signed)
Ozempic Rx sent to [facility] location per patient preference. Please see pt message regarding other medication and advise

## 2023-04-02 NOTE — Progress Notes (Signed)
please check with lab on pt autoimmune panel results - not resulted yet - let me know

## 2023-04-02 NOTE — Telephone Encounter (Signed)
Pt made aware of recommendations. Pt inquiring about most recent lab results.

## 2023-04-08 NOTE — Progress Notes (Signed)
can we check again, still no results??

## 2023-04-09 NOTE — Progress Notes (Signed)
Please Let Liala know the lab we sent out to, [facility], are having problems with their machines, and all labs are backed up. Very sorry for the delay.

## 2023-04-12 ENCOUNTER — Encounter: Payer: Self-pay | Admitting: Hematology

## 2023-04-12 ENCOUNTER — Other Ambulatory Visit (HOSPITAL_BASED_OUTPATIENT_CLINIC_OR_DEPARTMENT_OTHER): Payer: Self-pay

## 2023-04-13 ENCOUNTER — Other Ambulatory Visit (HOSPITAL_BASED_OUTPATIENT_CLINIC_OR_DEPARTMENT_OTHER): Payer: Self-pay

## 2023-04-16 ENCOUNTER — Other Ambulatory Visit: Payer: Self-pay | Admitting: Family

## 2023-04-16 DIAGNOSIS — K2960 Other gastritis without bleeding: Secondary | ICD-10-CM

## 2023-04-17 ENCOUNTER — Encounter: Payer: Self-pay | Admitting: Family

## 2023-04-17 ENCOUNTER — Encounter: Payer: Self-pay | Admitting: Hematology

## 2023-04-17 ENCOUNTER — Other Ambulatory Visit (HOSPITAL_BASED_OUTPATIENT_CLINIC_OR_DEPARTMENT_OTHER): Payer: Self-pay

## 2023-04-17 NOTE — Telephone Encounter (Signed)
See below

## 2023-04-18 ENCOUNTER — Other Ambulatory Visit (HOSPITAL_COMMUNITY): Payer: Self-pay

## 2023-04-18 ENCOUNTER — Telehealth: Payer: Self-pay

## 2023-04-18 NOTE — Telephone Encounter (Signed)
Pharmacy Patient Advocate Encounter   Received notification from Physician's Office that prior authorization for Ozempic 4mg /65ml is required/requested.   Insurance verification completed.   The patient is insured through [facility] .   Per test claim: PA required; PA submitted to [facility] via CoverMyMeds Key/confirmation #/EOC [id number] Status is pending

## 2023-04-19 ENCOUNTER — Other Ambulatory Visit (HOSPITAL_COMMUNITY): Payer: Self-pay

## 2023-04-19 ENCOUNTER — Other Ambulatory Visit (HOSPITAL_BASED_OUTPATIENT_CLINIC_OR_DEPARTMENT_OTHER): Payer: Self-pay

## 2023-04-19 NOTE — Telephone Encounter (Signed)
Pharmacy Patient Advocate Encounter  Received notification from [facility] that Prior Authorization for Ozempic 4mg /28ml has been APPROVED from 04/18/23 to 04/17/24   PA #/Case ID/Reference #: [id number]

## 2023-04-24 ENCOUNTER — Encounter: Payer: Self-pay | Admitting: Family

## 2023-04-25 ENCOUNTER — Other Ambulatory Visit (HOSPITAL_BASED_OUTPATIENT_CLINIC_OR_DEPARTMENT_OTHER): Payer: Self-pay

## 2023-04-25 ENCOUNTER — Ambulatory Visit (INDEPENDENT_AMBULATORY_CARE_PROVIDER_SITE_OTHER): Payer: 59 | Admitting: Family

## 2023-04-25 ENCOUNTER — Encounter: Payer: Self-pay | Admitting: Family

## 2023-04-25 ENCOUNTER — Encounter: Payer: Self-pay | Admitting: Hematology

## 2023-04-25 VITALS — BP 124/85 | HR 105 | Temp 98.0°F | Ht 63.0 in | Wt 224.4 lb

## 2023-04-25 DIAGNOSIS — R252 Cramp and spasm: Secondary | ICD-10-CM | POA: Diagnosis not present

## 2023-04-25 LAB — BASIC METABOLIC PANEL
BUN: 9 mg/dL (ref 6–23)
CO2: 29 meq/L (ref 19–32)
Calcium: 9.9 mg/dL (ref 8.4–10.5)
Chloride: 101 meq/L (ref 96–112)
Creatinine, Ser: 1.05 mg/dL (ref 0.40–1.20)
GFR: 64.57 mL/min (ref >60.00)
Glucose, Bld: 89 mg/dL (ref 70–99)
Potassium: 4.4 meq/L (ref 3.5–5.1)
Sodium: 139 meq/L (ref 135–145)

## 2023-04-25 MED ORDER — NAPROXEN 500 MG PO TABS
500.0000 mg | ORAL_TABLET | Freq: Two times a day (BID) | ORAL | 0 refills | Status: DC
Start: 2023-04-25 — End: 2023-07-02
  Filled 2023-04-25: qty 60, 30d supply, fill #0

## 2023-04-25 NOTE — Telephone Encounter (Signed)
Addressed question today during visit.

## 2023-04-25 NOTE — Assessment & Plan Note (Addendum)
chronic, unstable taking potassium and magnesium supplements, drinking water, using compression hose, elevating legs, and applying heat.  Cramps start in toes and progress up the back of the leg still waiting on autoimmune results from [facility]. Referral to sports medicine, Dr. Denyse Amass, for further evaluation. BMP today to check current electrolyte levels after severe cramping last night. Increase Flexeril dose to 20mg  at bedtime, with the option to take additional 5mg  doses as needed during the day. Also sending Naproxen 500mg  to take in the morning after a bad night to help with soreness and pain or as needed for pain. f/u prn

## 2023-04-25 NOTE — Progress Notes (Signed)
Patient ID: Bonnie Larsen, female    DOB: [birth year], 42 y.o.   MRN: [id number]  Chief Complaint  Patient presents with   leg cramping    Pt c/o bilateral leg cramps, Started getting worse last night. Has tried muscle relaxer and drinking water. Pt states it was painful to stand up and walk.    Discussed the use of AI scribe software for clinical note transcription with the patient, who gave verbal consent to proceed.  History of Present Illness   The patient, with a history of severe leg cramps, presents with worsening symptoms. The cramps have been affecting both legs intermittently, sometimes shifting from one leg to the other. The severity of the cramps has escalated to the point where the patient was unable to stand or drive. The patient has been managing the cramps with potassium and magnesium supplements, hydration, compression hose, leg elevation, and heat application. Despite these measures, the cramps have persisted and have been associated with a throbbing sensation. The patient has also been taking a muscle relaxer, which has been causing drowsiness. Previous venous and arterial testing negative. Last visit labs performed, all negative, still waiting on autoimmune panel results.    Assessment & Plan:     Leg Cramps - Patient has been taking potassium and magnesium supplements, drinking water, using compression hose, elevating legs, and applying heat. Cramps start in toes and progress up the back of the leg. Waiting on autoimmune results from [facility]. -Referral to sports medicine, Dr. Denyse Amass, for further evaluation. -Order Basic Metabolic Panel (BMP) to check current electrolyte levels. -Increase Flexeril dose to 20mg  at bedtime, with the option to take additional 5mg  doses as needed during the day. -Also sending Naproxen 500mg  to take in the morning after a bad night to help with soreness and pain or as needed for pain.  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 -Provide work excuse note for today's date.      Subjective:    Outpatient Medications Prior to Visit  Medication Sig Dispense Refill   Cholecalciferol (VITAMIN D3) 1.25 MG (50000 UT) CAPS Take 1 capsule (1.25 mg total) by mouth once a week. 12 capsule 1   cyclobenzaprine (FLEXERIL) 5 MG tablet Take 1 tablet (5 mg total) by mouth in the morning and at bedtime. For leg cramping. 60 tablet 2   fluticasone (FLONASE) 50 MCG/ACT nasal spray Place 2 sprays into both nostrils daily.     hydrochlorothiazide (HYDRODIURIL) 12.5 MG tablet Take 1 tablet (12.5 mg total) by mouth daily as needed. 90 tablet 1   levonorgestrel (MIRENA) 20 MCG/24HR IUD 1 each by Intrauterine route once.     MAGNESIUM PO Take by mouth.     metFORMIN (GLUCOPHAGE-XR) 500 MG 24 hr tablet Take 1 tablet by mouth in the evening 90 tablet 0   Multiple Vitamin (MULTIVITAMIN) tablet Take 1 tablet by mouth daily.     NEEDLE, DISP, 30 G 30G X 1/2" MISC Inject 1 each as directed daily. 50 each 0   ondansetron (ZOFRAN) 4 MG tablet Take 1 tablet (4 mg total) by mouth every 8 (eight) hours as needed for nausea or vomiting. 20 tablet 1   pantoprazole (PROTONIX) 20 MG tablet TAKE 1 TABLET BY MOUTH TWICE DAILY FOR  1  WEEK,  THEN  1  TABLET  EVERY  MORNING. 60 tablet 0   POTASSIUM PO Take by mouth.     Semaglutide, 1 MG/DOSE, 4 MG/3ML  SOPN Inject 1 mg as directed once a week. 3 mL 2   Facility-Administered Medications Prior to Visit  Medication Dose Route Frequency Provider Last Rate Last Admin   sodium chloride flush (NS) 0.9 % injection 10 mL  10 mL Intravenous PRN Johney Maine, MD   10 mL at 04/16/17 1938   Past Medical History:  Diagnosis Date   Adenopathy 01/10/2017   Upper abdominal. Per CT   Anemia    Anxiety    Bacterial pharyngitis 07/30/2016   Counseling regarding advanced care planning and goals of care 06/24/2017   Depression    Difficult intravenous access    Family history of adverse  reaction to anesthesia    pt's mother has hx. 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   Seasonal allergies    Past Surgical History:  Procedure Laterality Date   LYMPH NODE BIOPSY N/A 02/11/2017   Procedure: HAND ASSISTED LAPAROSCOPIC LYMPH NODE BIOPSY;  Surgeon: Almond Lint, MD;  Location: [facility];  Service: General;  Laterality: N/A;   PORT-A-CATH REMOVAL N/A 09/04/2017   Procedure: REMOVAL PORT-A-CATH;  Surgeon: Almond Lint, MD;  Location: [facility];  Service: General;  Laterality: N/A;   PORTACATH PLACEMENT N/A 02/20/2017   Procedure: INSERTION PORT-A-CATH;  Surgeon: Almond Lint, MD;  Location: [facility];  Service: General;  Laterality: N/A;   TUBAL LIGATION     UMBILICAL HERNIA REPAIR  02/11/2017   Allergies  Allergen Reactions   Clindamycin/Lincomycin Swelling    FACIAL SWELLING   Morphine    Cymbalta [Duloxetine Hcl] Nausea Only    Causes pt to have nausea and stomach cramping.      Objective:    Physical Exam Vitals and nursing note reviewed.  Constitutional:      Appearance: Normal appearance.  Cardiovascular:     Rate and Rhythm: Normal rate and regular rhythm.  Pulmonary:     Effort: Pulmonary effort is normal.     Breath sounds: Normal breath sounds.  Musculoskeletal:        General: Normal range of motion.  Skin:    General: Skin is warm and dry.  Neurological:     Mental Status: She is alert.  Psychiatric:        Mood and Affect: Mood normal.        Behavior: Behavior normal.    BP 124/85 (BP Location: Left Arm, Patient Position: Sitting, Cuff Size: Large)   Pulse (!) 105   Temp 98 F (36.7 C)  (Temporal)   Ht 5\' 3"  (1.6 m)   Wt 224 lb 6 oz (101.8 kg)   SpO2 97%   BMI 39.75 kg/m  Wt Readings from Last 3 Encounters:  04/25/23 224 lb 6 oz (101.8 kg)  03/28/23 230 lb (104.3 kg)  03/18/23 229 lb 2 oz (103.9 kg)       Dulce Sellar, NP

## 2023-05-01 ENCOUNTER — Ambulatory Visit: Payer: 59 | Admitting: Family Medicine

## 2023-05-02 LAB — ANALYZER(R)ANA IFA WITH REFLEX TITER/PATTRN,SYS AUTOIMM PNL1
Anti Nuclear Antibody (ANA): NEGATIVE
Anticardiolipin IgA: <2 [APL'U]/mL
Anticardiolipin IgG: <2 [GPL'U]/mL
Anticardiolipin IgM: <2 [MPL'U]/mL
Beta-2 Glyco 1 IgA: <2 U/mL
Beta-2 Glyco 1 IgM: <2 U/mL
Beta-2 Glyco I IgG: <2 U/mL
C3 Complement: 210 mg/dL — ABNORMAL HIGH (ref 83–193)
C4 Complement: 36 mg/dL (ref 15–57)
Centromere Ab Screen: <1 AI
Chromatin (Nucleosomal) Antibody: <1 AI
Cyclic Citrullin Peptide Ab: <16 U
DNA Ab (DS) Crithidia, IFA: NEGATIVE
ENA SM Ab Ser-aCnc: <1 AI
Jo-1 Autoabs: <1 AI
MUTATED CITRULLINATED VIMENTIN (MCV) AB: <20 U/mL (ref <20)
Ribonucleic Protein(ENA) Antibody, IgG: <1 AI
SM/RNP: <1 AI
SSA (Ro) (ENA) Antibody, IgG: <1 AI
SSB (La) (ENA) Antibody, IgG: <1 AI
Scleroderma (Scl-70) (ENA) Antibody, IgG: <1 AI

## 2023-05-02 LAB — VITAMIN B6: Vitamin B6: 19 ng/mL (ref 2.1–21.7)

## 2023-05-03 ENCOUNTER — Other Ambulatory Visit: Payer: Self-pay | Admitting: Family

## 2023-05-03 ENCOUNTER — Other Ambulatory Visit (HOSPITAL_BASED_OUTPATIENT_CLINIC_OR_DEPARTMENT_OTHER): Payer: Self-pay

## 2023-05-03 ENCOUNTER — Other Ambulatory Visit: Payer: Self-pay

## 2023-05-03 DIAGNOSIS — R110 Nausea: Secondary | ICD-10-CM

## 2023-05-03 DIAGNOSIS — E559 Vitamin D deficiency, unspecified: Secondary | ICD-10-CM

## 2023-05-04 ENCOUNTER — Other Ambulatory Visit (HOSPITAL_BASED_OUTPATIENT_CLINIC_OR_DEPARTMENT_OTHER): Payer: Self-pay

## 2023-05-05 ENCOUNTER — Encounter: Payer: Self-pay | Admitting: Family

## 2023-05-06 NOTE — Telephone Encounter (Signed)
I removed phentermine, thx

## 2023-05-07 ENCOUNTER — Ambulatory Visit: Payer: 59 | Admitting: Family Medicine

## 2023-05-07 NOTE — Progress Notes (Deleted)
   IPhilbert Riser, PhD, LAT, ATC acting as a scribe for Clementeen Quinton, MD.  Bonnie Larsen is a 42 y.o. female who presents to [facility] at [facility] today for bilat leg cramping x ***, worsening end of Oct. Pt locates pain to ***  Low back pain: Radiating pain: LE numbness/tingling: LE weakness: Aggravates: Treatments tried: potassium and magnesium supplements, hydration, compression hose, leg elevation, heat, muscle relaxer, naproxen  Dx testing: 04/25/23, 10/3, & 9/23 Labs 02/04/23 Vasc US & ABI  Pertinent review of systems: ***  Relevant historical information: ***   Exam:  There were no vitals taken for this visit. General: Well Developed, well nourished, and in no acute distress.   MSK: ***    Lab and Radiology Results No results found for this or any previous visit (from the past 72 hour(s)). No results found.     Assessment and Plan: 42 y.o. female with ***   PDMP not reviewed this encounter. No orders of the defined types were placed in this encounter.  No orders of the defined types were placed in this encounter.    Discussed warning signs or symptoms. Please see discharge instructions. Patient expresses understanding.   ***

## 2023-05-09 ENCOUNTER — Other Ambulatory Visit (HOSPITAL_BASED_OUTPATIENT_CLINIC_OR_DEPARTMENT_OTHER): Payer: Self-pay

## 2023-05-13 ENCOUNTER — Ambulatory Visit (INDEPENDENT_AMBULATORY_CARE_PROVIDER_SITE_OTHER): Payer: 59

## 2023-05-13 ENCOUNTER — Ambulatory Visit (INDEPENDENT_AMBULATORY_CARE_PROVIDER_SITE_OTHER): Payer: 59 | Admitting: Family Medicine

## 2023-05-13 ENCOUNTER — Other Ambulatory Visit (HOSPITAL_BASED_OUTPATIENT_CLINIC_OR_DEPARTMENT_OTHER): Payer: Self-pay

## 2023-05-13 VITALS — BP 130/82 | HR 103 | Ht 63.0 in | Wt 229.0 lb

## 2023-05-13 DIAGNOSIS — M51369 Other intervertebral disc degeneration, lumbar region without mention of lumbar back pain or lower extremity pain: Secondary | ICD-10-CM | POA: Diagnosis not present

## 2023-05-13 DIAGNOSIS — R5382 Chronic fatigue, unspecified: Secondary | ICD-10-CM | POA: Diagnosis not present

## 2023-05-13 DIAGNOSIS — D649 Anemia, unspecified: Secondary | ICD-10-CM

## 2023-05-13 DIAGNOSIS — R252 Cramp and spasm: Secondary | ICD-10-CM

## 2023-05-13 DIAGNOSIS — M4807 Spinal stenosis, lumbosacral region: Secondary | ICD-10-CM | POA: Diagnosis not present

## 2023-05-13 LAB — TSH: TSH: 1.02 u[IU]/mL (ref 0.35–5.50)

## 2023-05-13 MED ORDER — BACLOFEN 10 MG PO TABS
10.0000 mg | ORAL_TABLET | Freq: Every evening | ORAL | 12 refills | Status: DC | PRN
  Filled 2023-05-13: qty 60, 30d supply, fill #0
  Filled 2023-07-02: qty 60, 30d supply, fill #1
  Filled 2023-07-31: qty 60, 30d supply, fill #2
  Filled 2023-11-06: qty 60, 30d supply, fill #3
  Filled 2023-12-26: qty 60, 30d supply, fill #4
  Filled 2024-02-07: qty 60, 30d supply, fill #5
  Filled 2024-05-07: qty 60, 30d supply, fill #6

## 2023-05-13 NOTE — Patient Instructions (Addendum)
Thank you for coming in today.   Please get an Xray today before you leave   Please get labs today before you leave   Try baclofen at bedtime.   You should hear from pulmonology about an appointment for sleep study soon.   Let me know how you are feeling.

## 2023-05-13 NOTE — Progress Notes (Unsigned)
IPhilbert Riser, PhD, LAT, ATC acting as a scribe for Clementeen Detjen, MD.  Bonnie Larsen is a 42 y.o. female who presents to [facility] at [facility] today for bilat leg cramping x 2-3 months, worsening end of Oct. Pt locates pain to below the knee, starts in her feet and radiates up her legs. patient and PCP has been working with her, changing medication, autoimmune panel (but some of the labs did not get processed), and then sent here to see if we could further help her. Once the cramping is over patient is sore for a very long time   Low back pain: no Radiating pain:yes  LE numbness/tingling: she will notices this before the cramps start LE weakness: yes very sore hard to walk Aggravates:  not sure what causes it.  Treatments tried: potassium and magnesium supplements, hydration, compression hose, leg elevation, heat, muscle relaxer, naproxen  Dx testing: 04/25/23, 10/3, & 9/23 Labs 02/04/23 Vasc US & ABI  Pertinent review of systems: Positive for fatigue. ***  Relevant historical information: ***   Exam:  BP 130/82   Pulse (!) 103   Ht 5\' 3"  (1.6 m)   Wt 229 lb (103.9 kg)   SpO2 98%   BMI 40.57 kg/m  General: Well Developed, well nourished, and in no acute distress.   MSK: ***    Lab and Radiology Results No results found for this or any previous visit (from the past 72 hour(s)). No results found.       05/13/2023    3:38 PM  Results of the Epworth flowsheet  Sitting and reading 2  Watching TV 3  Sitting, inactive in a public place (e.g. a theatre or a meeting) 3  As a passenger in a car for an hour without a break 1  Lying down to rest in the afternoon when circumstances permit 3  Sitting and talking to someone 1  Sitting quietly after a lunch without alcohol 3  In a car, while stopped for a few minutes in traffic 0  Total score 16      Assessment and Plan: 42 y.o. female with ***   PDMP not reviewed this encounter. Orders Placed  This Encounter  Procedures   DG Lumbar Spine 2-3 Views    Standing Status:   Future    Number of Occurrences:   1    Standing Expiration Date:   05/12/2024    Order Specific Question:   Reason for Exam (SYMPTOM  OR DIAGNOSIS REQUIRED)    Answer:   eval poss radicuopathy    Order Specific Question:   Is patient pregnant?    Answer:   No    Order Specific Question:   Preferred imaging location?    Answer:   [facility]   Iron, TIBC and Ferritin Panel    Standing Status:   Future    Standing Expiration Date:   11/10/2023   TSH   Ambulatory referral to Pulmonology    Referral Priority:   Routine    Referral Type:   Consultation    Referral Reason:   Specialty Services Required    Requested Specialty:   Pulmonary Disease    Number of Visits Requested:   1   Meds ordered this encounter  Medications   baclofen (LIORESAL) 10 MG tablet    Sig: Take 1-2 tablets (10-20 mg total) by mouth at bedtime as needed for muscle spasms.    Dispense:  60 each  Refill:  12     Discussed warning signs or symptoms. Please see discharge instructions. Patient expresses understanding.   ***

## 2023-05-14 LAB — IRON,TIBC AND FERRITIN PANEL
%SAT: 18 % (ref 16–45)
Ferritin: 59 ng/mL (ref 16–232)
Iron: 55 ug/dL (ref 40–190)
TIBC: 299 ug/dL (ref 250–450)

## 2023-05-14 NOTE — Progress Notes (Signed)
Thyroid and iron stores look normal.

## 2023-05-24 ENCOUNTER — Encounter: Payer: Self-pay | Admitting: Family Medicine

## 2023-05-24 DIAGNOSIS — R252 Cramp and spasm: Secondary | ICD-10-CM

## 2023-05-24 DIAGNOSIS — M5416 Radiculopathy, lumbar region: Secondary | ICD-10-CM

## 2023-05-27 NOTE — Telephone Encounter (Signed)
Forwarding Dr. Denyse Amass to review and advise.

## 2023-05-27 NOTE — Telephone Encounter (Signed)
Order for NCV w/ EMG pending. Forwarding to Dr. Denyse Amass to advise regarding increased dose of Baclofen.

## 2023-05-27 NOTE — Addendum Note (Signed)
Addended by: Dierdre Searles on: 05/27/2023 03:33 PM   Modules accepted: Orders

## 2023-05-27 NOTE — Progress Notes (Signed)
 Low back x-ray shows mild arthritis

## 2023-06-01 ENCOUNTER — Other Ambulatory Visit: Payer: 59

## 2023-06-02 ENCOUNTER — Ambulatory Visit: Payer: 59

## 2023-06-02 DIAGNOSIS — M549 Dorsalgia, unspecified: Secondary | ICD-10-CM | POA: Diagnosis not present

## 2023-06-02 DIAGNOSIS — R599 Enlarged lymph nodes, unspecified: Secondary | ICD-10-CM

## 2023-06-02 DIAGNOSIS — M5416 Radiculopathy, lumbar region: Secondary | ICD-10-CM

## 2023-06-02 DIAGNOSIS — M47816 Spondylosis without myelopathy or radiculopathy, lumbar region: Secondary | ICD-10-CM

## 2023-06-02 DIAGNOSIS — R252 Cramp and spasm: Secondary | ICD-10-CM

## 2023-06-02 DIAGNOSIS — M5126 Other intervertebral disc displacement, lumbar region: Secondary | ICD-10-CM | POA: Diagnosis not present

## 2023-06-02 DIAGNOSIS — M79604 Pain in right leg: Secondary | ICD-10-CM | POA: Diagnosis not present

## 2023-06-02 DIAGNOSIS — M79605 Pain in left leg: Secondary | ICD-10-CM | POA: Diagnosis not present

## 2023-06-03 ENCOUNTER — Encounter: Payer: Self-pay | Admitting: Family

## 2023-06-04 NOTE — Telephone Encounter (Signed)
Noted. Will await pt to schedule OV to advise.

## 2023-06-10 ENCOUNTER — Encounter: Payer: Self-pay | Admitting: Family

## 2023-06-10 ENCOUNTER — Ambulatory Visit: Payer: 59 | Admitting: Family

## 2023-06-10 ENCOUNTER — Encounter: Payer: Self-pay | Admitting: Family Medicine

## 2023-06-10 ENCOUNTER — Ambulatory Visit (INDEPENDENT_AMBULATORY_CARE_PROVIDER_SITE_OTHER): Payer: 59 | Admitting: Family

## 2023-06-10 ENCOUNTER — Other Ambulatory Visit (HOSPITAL_BASED_OUTPATIENT_CLINIC_OR_DEPARTMENT_OTHER): Payer: Self-pay

## 2023-06-10 DIAGNOSIS — R7303 Prediabetes: Secondary | ICD-10-CM | POA: Diagnosis not present

## 2023-06-10 MED ORDER — SEMAGLUTIDE (2 MG/DOSE) 8 MG/3ML ~~LOC~~ SOPN
2.0000 mg | PEN_INJECTOR | SUBCUTANEOUS | 5 refills | Status: DC
Start: 2023-06-10 — End: 2023-11-27
  Filled 2023-06-10: qty 3, 28d supply, fill #0
  Filled 2023-07-02: qty 3, 28d supply, fill #1
  Filled 2023-07-31: qty 3, 28d supply, fill #2
  Filled 2023-08-28: qty 3, 28d supply, fill #3
  Filled 2023-09-22: qty 3, 28d supply, fill #4
  Filled 2023-10-22: qty 3, 28d supply, fill #5

## 2023-06-10 NOTE — Progress Notes (Signed)
Patient ID: Bonnie Larsen, female    DOB: [birth year], 42 y.o.   MRN: [id number]  Chief Complaint  Patient presents with   Diabetes      HPI: T2DM: Pt is currently maintained on the following medications for diabetes: Metformin, Victoza. Failed meds include: none. Denies polyuria/polydipsia. Denies hypoglycemia Home glucose readings range: not checking at home. Last A1C 5.9. pt has lost 25lbs.  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   Orders: -     Semaglutide (2 MG/DOSE); Inject 2 mg as directed once a week.  Dispense: 3 mL; Refill: 5   Subjective:    Outpatient Medications Prior to Visit  Medication Sig Dispense Refill   baclofen (LIORESAL) 10 MG tablet Take 1-2 tablets (10-20 mg total) by mouth at bedtime as needed for muscle spasms. 60 each 12   Cholecalciferol (VITAMIN D3) 1.25 MG (50000 UT) CAPS Take 1 capsule by mouth once a week 12 capsule 0   cyclobenzaprine (FLEXERIL) 5 MG tablet Take 1 tablet (5 mg total) by mouth in the morning and at bedtime. For leg cramping. 60 tablet 2   fluticasone (FLONASE) 50 MCG/ACT nasal spray Place 2 sprays into both nostrils daily.     hydrochlorothiazide (HYDRODIURIL) 12.5 MG tablet Take 1 tablet (12.5 mg total) by mouth daily as needed. 90 tablet 1   levonorgestrel (MIRENA) 20 MCG/24HR IUD 1 each by Intrauterine route once.     MAGNESIUM PO Take by mouth.     metFORMIN (GLUCOPHAGE-XR) 500 MG 24 hr tablet Take 1 tablet by mouth in the evening 90 tablet 0   Multiple Vitamin (MULTIVITAMIN) tablet Take 1 tablet by mouth daily.      naproxen (NAPROSYN) 500 MG tablet Take 1 tablet (500 mg total) by mouth 2 (two) times daily with a meal. For leg pain. (Patient taking differently: Take 500 mg by mouth 2 (two) times daily as needed for moderate pain (pain score 4-6). For leg pain.) 60 tablet 0   NEEDLE, DISP, 30 G 30G X 1/2" MISC Inject 1 each as directed daily. 50 each 0   ondansetron (ZOFRAN) 4 MG tablet TAKE 1 TABLET BY MOUTH EVERY 8 HOURS AS NEEDED FOR NAUSEA OR VOMITING 20 tablet 0   pantoprazole (PROTONIX) 20 MG tablet TAKE 1 TABLET BY MOUTH TWICE DAILY FOR  1  WEEK,  THEN  1  TABLET  EVERY  MORNING. 60 tablet 0   POTASSIUM PO Take by mouth.     Semaglutide, 1 MG/DOSE, 4 MG/3ML SOPN Inject 1 mg as directed once a week. 3 mL 2   phentermine (ADIPEX-P) 37.5 MG tablet TAKE 1/2 (ONE-HALF) TABLET BY MOUTH ONCE DAILY BEFORE BREAKFAST (Patient not taking: Reported on 06/10/2023) 30 tablet 0   Facility-Administered Medications Prior to Visit  Medication Dose Route Frequency Provider Last Rate Last Admin   sodium chloride flush (NS) 0.9 % injection 10 mL  10 mL Intravenous PRN Johney Maine, MD   10 mL at 04/16/17 1938   Past Medical History:  Diagnosis Date   Adenopathy 01/10/2017   Upper abdominal. Per  CT   Anemia    Anxiety    Bacterial pharyngitis 07/30/2016   Counseling regarding advanced care planning and goals of care 06/24/2017   Depression    Difficult intravenous access    Family history of adverse reaction to anesthesia    pt's mother has hx. 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   Seasonal allergies    Past Surgical History:  Procedure Laterality Date    LYMPH NODE BIOPSY N/A 02/11/2017   Procedure: HAND ASSISTED LAPAROSCOPIC LYMPH NODE BIOPSY;  Surgeon: Almond Lint, MD;  Location: [facility] OR;  Service: General;  Laterality: N/A;   PORT-A-CATH REMOVAL N/A 09/04/2017   Procedure: REMOVAL PORT-A-CATH;  Surgeon: Almond Lint, MD;  Location: [facility] OR;  Service: General;  Laterality: N/A;   PORTACATH PLACEMENT N/A 02/20/2017   Procedure: INSERTION PORT-A-CATH;  Surgeon: Almond Lint, MD;  Location: [facility];  Service: General;  Laterality: N/A;   TUBAL LIGATION     UMBILICAL HERNIA REPAIR  02/11/2017   Allergies  Allergen Reactions   Clindamycin/Lincomycin Swelling    FACIAL SWELLING   Morphine    Cymbalta [Duloxetine Hcl] Nausea Only    Causes pt to have nausea and stomach cramping.      Objective:    Physical Exam Vitals and nursing note reviewed.  Constitutional:      Appearance: Normal appearance. She is morbidly obese.  Cardiovascular:     Rate and Rhythm: Normal rate and regular rhythm.  Pulmonary:     Effort: Pulmonary effort is normal.     Breath sounds: Normal breath sounds.  Musculoskeletal:        General: Normal range of motion.  Skin:    General: Skin is warm and dry.  Neurological:     Mental Status: She is alert.  Psychiatric:        Mood and Affect: Mood normal.        Behavior: Behavior normal.    BP 109/68 (BP Location: Left Arm, Patient Position: Sitting, Cuff Size: Large)   Pulse 74   Temp 98 F (36.7 C) (Temporal)   Ht 5\' 3"  (1.6 m)   Wt 227 lb (103 kg)   SpO2 98%   BMI 40.21 kg/m  Wt Readings from Last 3 Encounters:  06/10/23 227 lb (103 kg)  05/13/23 229 lb (103.9 kg)  04/25/23 224 lb 6 oz (101.8 kg)     Dulce Sellar, NP

## 2023-06-10 NOTE — Assessment & Plan Note (Signed)
chronic, stable tolerating Metformin & Ozempic 1mg  weight loss has leveled off again wants to increase dose increasing dose to 2mg  dose if difficulty getting dose, can add low dose phentermine, or start Contrave, Qsymia, etc pt advised to check if ins will cover weight loss meds continue to advise on low carb diet, exercise, reduce total daily caloric intake f/u 2 months.

## 2023-06-11 ENCOUNTER — Encounter: Payer: Self-pay | Admitting: Hematology

## 2023-06-11 ENCOUNTER — Encounter: Payer: Self-pay | Admitting: Family

## 2023-06-11 MED ORDER — PHENTERMINE HCL 37.5 MG PO TABS
18.7500 mg | ORAL_TABLET | Freq: Every day | ORAL | 0 refills | Status: DC
Start: 2023-06-11 — End: 2023-08-02

## 2023-06-11 MED ORDER — PHENTERMINE HCL 37.5 MG PO TABS
18.7500 mg | ORAL_TABLET | Freq: Every day | ORAL | 0 refills | Status: DC
Start: 2023-06-11 — End: 2023-06-11

## 2023-06-11 NOTE — Addendum Note (Signed)
Addended byDulce Sellar on: 06/11/2023 03:01 PM   Modules accepted: Orders

## 2023-06-12 NOTE — Progress Notes (Signed)
Pt contacted regarding My chart message. Dr Candise Che updated with pt's concerns. Per Dr Candise Che we will order a scan and then set up appt with pt. Pt called and updated.

## 2023-06-13 ENCOUNTER — Other Ambulatory Visit: Payer: Self-pay | Admitting: Hematology

## 2023-06-13 DIAGNOSIS — C8108 Nodular lymphocyte predominant Hodgkin lymphoma, lymph nodes of multiple sites: Secondary | ICD-10-CM

## 2023-06-13 NOTE — Progress Notes (Unsigned)
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

## 2023-06-14 ENCOUNTER — Encounter: Payer: Self-pay | Admitting: Family

## 2023-06-14 NOTE — Telephone Encounter (Signed)
 Care team updated and letter sent for eye exam notes.

## 2023-06-17 ENCOUNTER — Encounter: Payer: Self-pay | Admitting: Family

## 2023-06-17 ENCOUNTER — Other Ambulatory Visit: Payer: Self-pay | Admitting: Family

## 2023-06-17 DIAGNOSIS — E119 Type 2 diabetes mellitus without complications: Secondary | ICD-10-CM

## 2023-06-24 NOTE — Progress Notes (Signed)
Low back MRI shows areas of back arthritis that could cause back pain.  There is not a lot of pinched nerves causing leg pain or leg weakness or leg cramping.  Recommend return to clinic to go over the results of full detail and discussed treatment plan and options from here.

## 2023-06-25 ENCOUNTER — Encounter: Payer: Self-pay | Admitting: Hematology

## 2023-06-27 ENCOUNTER — Ambulatory Visit (HOSPITAL_COMMUNITY)
Admission: RE | Admit: 2023-06-27 | Discharge: 2023-06-27 | Disposition: A | Discharge status: Home / Self Care | Payer: 59 | Source: Ambulatory Visit | Attending: Hematology | Admitting: Hematology

## 2023-06-27 DIAGNOSIS — R590 Localized enlarged lymph nodes: Secondary | ICD-10-CM | POA: Diagnosis not present

## 2023-06-27 DIAGNOSIS — C8108 Nodular lymphocyte predominant Hodgkin lymphoma, lymph nodes of multiple sites: Secondary | ICD-10-CM | POA: Insufficient documentation

## 2023-06-27 DIAGNOSIS — C8192 Hodgkin lymphoma, unspecified, intrathoracic lymph nodes: Secondary | ICD-10-CM | POA: Diagnosis not present

## 2023-06-27 DIAGNOSIS — E041 Nontoxic single thyroid nodule: Secondary | ICD-10-CM | POA: Diagnosis not present

## 2023-06-27 MED ORDER — IOHEXOL 300 MG/ML  SOLN
100.0000 mL | Freq: Once | INTRAMUSCULAR | Status: AC | PRN
  Administered 2023-06-27: 100 mL via INTRAVENOUS

## 2023-06-27 MED ORDER — IOHEXOL 9 MG/ML PO SOLN
ORAL | Status: AC
  Filled 2023-06-27: qty 1000

## 2023-06-27 MED ORDER — IOHEXOL 9 MG/ML PO SOLN
500.0000 mL | ORAL | Status: AC
  Administered 2023-06-27 (×2): 500 mL via ORAL

## 2023-07-02 ENCOUNTER — Other Ambulatory Visit: Payer: Self-pay | Admitting: Family

## 2023-07-02 ENCOUNTER — Other Ambulatory Visit (HOSPITAL_BASED_OUTPATIENT_CLINIC_OR_DEPARTMENT_OTHER): Payer: Self-pay

## 2023-07-02 ENCOUNTER — Ambulatory Visit (INDEPENDENT_AMBULATORY_CARE_PROVIDER_SITE_OTHER): Payer: 59 | Admitting: Family Medicine

## 2023-07-02 ENCOUNTER — Other Ambulatory Visit: Payer: Self-pay

## 2023-07-02 VITALS — BP 128/86 | HR 117 | Ht 63.0 in | Wt 226.0 lb

## 2023-07-02 DIAGNOSIS — E041 Nontoxic single thyroid nodule: Secondary | ICD-10-CM

## 2023-07-02 DIAGNOSIS — R252 Cramp and spasm: Secondary | ICD-10-CM

## 2023-07-02 MED ORDER — NAPROXEN 500 MG PO TABS
500.0000 mg | ORAL_TABLET | Freq: Two times a day (BID) | ORAL | 0 refills | Status: DC
  Filled 2023-07-02: qty 60, 30d supply, fill #0

## 2023-07-02 NOTE — Patient Instructions (Addendum)
 Thank you for coming in today.   I have ordered that thyorid ultrasound. You should hear soon about scheduling.   Next step for your leg cramping is a nerve conduction study EMG.   Let me know if you would like to do this.   Recheck as needed.   Continue baclofen  as needed.

## 2023-07-02 NOTE — Progress Notes (Signed)
 LILLETTE Ileana Collet, PhD, LAT, ATC acting as a scribe for Artist Lloyd, MD.  Bonnie Larsen is a 43 y.o. female who presents to [facility] today for f/u leg cramping w/ L-spine MRI review. Pt was last seen by Dr. Lloyd on 05/24/23 and additional labs were obtained and she was prescribed baclofen .   Today, pt reports leg cramping doesn't seem to be as frequent, but still hurting. Less severe pain/cramping. She has been taking the baclofen   She had a CT scan recently that did show a thyroid  nodule.  This was ordered by her oncologist.  She has not seen her oncologist until the end of the month and the follow-up thyroid  ultrasound that was recommended by radiology has not been ordered yet.  Dx testing: 06/02/23 L-spine MRI 05/13/23 L-spine XR & Labs 04/25/23, 10/3, & 9/23 Labs 02/04/23 Vasc US  & ABI  Pertinent review of systems: No fevers or chills  Relevant historical information: [facility] lymphoma history   Exam:  BP 128/86   Pulse (!) 117   Ht 5' 3 (1.6 m)   Wt 226 lb (102.5 kg)   SpO2 98%   BMI 40.03 kg/m  General: Well Developed, well nourished, and in no acute distress.   MSK: Normal lumbar motion.    Lab and Radiology Results   EXAM: MRI LUMBAR SPINE WITHOUT CONTRAST   TECHNIQUE: Multiplanar, multisequence MR imaging of the lumbar spine was performed. No intravenous contrast was administered.   COMPARISON:  Lumbar radiographs 05/13/2023   FINDINGS: Segmentation: There are five lumbar type vertebral bodies. The last full intervertebral disc space is labeled L5-S1. This correlates with the lumbar radiographs.   Alignment:  Normal   Vertebrae:  Normal marrow signal.  No bone lesions or fractures.   Conus medullaris and cauda equina: Conus extends to the bottom of L1 level. Conus and cauda equina appear normal.   Paraspinal and other soft tissues: No significant paraspinal or retroperitoneal findings. A few small scattered  retroperitoneal nodes are noted., similar to prior CT scan from 2022.   Disc levels:   T12-L1: No significant findings.   L1-2: Mild facet disease but no disc protrusions, spinal foraminal stenosis.   L2-3: Mild facet disease but no disc protrusions, spinal foraminal stenosis.   L3-4: Mild to moderate facet disease but no disc protrusions, spinal or foraminal stenosis.   L4-5: Mild annular bulge with slight flattening of the ventral thecal sac. Moderate to advanced right-sided facet disease but no spinal or foraminal stenosis.   L5-S1: Moderate facet disease but no disc protrusions, spinal or foraminal stenosis.   IMPRESSION: 1. Mild annular bulge at L4-5 but no disc protrusions, spinal or foraminal stenosis. 2. Multilevel facet disease. 3. Scattered retroperitoneal lymph nodes.     Electronically Signed   By: MYRTIS Stammer M.D.   On: 06/15/2023 10:40 I, Artist Lloyd, personally (independently) visualized and performed the interpretation of the images attached in this note.  CT chest abdomen pelvis read on June 27, 2023 IMPRESSION: 1. Similar to slightly decreased size of lymph nodes above the diaphragm with similar lymphadenopathy below the diaphragm. No new pathologically enlarged or enlarging lymph nodes identified above or below the diaphragm. 2. No splenomegaly. 3. Soft tissue mass in the upper central right breast measures 3.1 x 1.1 cm previously 4.1 x 2.1 cm. 4. Heterogeneous 16 mm nodule in the right lobe of the thyroid  is new from prior, suggest further evaluation with thyroid  ultrasound.  Electronically Signed   By: Reyes Holder M.D.   On: 06/27/2023 14:50    Assessment and Plan: 43 y.o. female with leg cramping and leg pain.  Etiology is unclear.  She had a pretty good rheumatologic and metabolic workup recently which was unrevealing.  Lumbar spine MRI does not show obvious nerve impingement.  Fortunately she has had benefit from baclofen  which  seems to be working okay for the leg cramping.  Next step would typically be nerve conduction study.  Once I described that procedure she wants to wait on that test and continue with baclofen  for now.  I think this makes lots of sense.  We can get a nerve conduction study whenever she tells me she would like it.  Additionally I am going to go ahead and order a thyroid  ultrasound as recommended by radiology to follow-up the incidentally noted thyroid  nodule seen on CT scan earlier this month.  This should be back in time for her to have her follow-up appointment with her oncologist towards the end of the month.  Recheck back with me as needed.   PDMP not reviewed this encounter. Orders Placed This Encounter  Procedures   US  THYROID     [employer] EPIC WT: 226 NO NEEDS NO DEVICES PT AWARE 75 NO SHOW FEE KG AND PT    Standing Status:   Future    Expiration Date:   12/30/2023    Reason for Exam (SYMPTOM  OR DIAGNOSIS REQUIRED):   EVAL THYROID  NODULE SEEN ON CT    Preferred imaging location?:   [facility]   No orders of the defined types were placed in this encounter.    Discussed warning signs or symptoms. Please see discharge instructions. Patient expresses understanding.   The above documentation has been reviewed and is accurate and complete Artist Lloyd, M.D.

## 2023-07-03 ENCOUNTER — Other Ambulatory Visit: Payer: 59

## 2023-07-03 ENCOUNTER — Ambulatory Visit
Admission: RE | Admit: 2023-07-03 | Discharge: 2023-07-03 | Disposition: A | Discharge status: Home / Self Care | Payer: 59 | Source: Ambulatory Visit | Attending: Family Medicine | Admitting: Family Medicine

## 2023-07-03 DIAGNOSIS — E042 Nontoxic multinodular goiter: Secondary | ICD-10-CM | POA: Diagnosis not present

## 2023-07-03 DIAGNOSIS — E041 Nontoxic single thyroid nodule: Secondary | ICD-10-CM

## 2023-07-05 NOTE — Progress Notes (Signed)
 The nodule is benign appearing but radiology does want it to be followed up annually for 5 years.  I will make sure your treatment team is aware of this.

## 2023-07-09 ENCOUNTER — Encounter: Payer: Self-pay | Admitting: Primary Care

## 2023-07-09 ENCOUNTER — Ambulatory Visit: Payer: 59 | Admitting: Primary Care

## 2023-07-09 VITALS — BP 113/79 | HR 99 | Temp 96.9°F | Ht 63.0 in | Wt 229.8 lb

## 2023-07-09 DIAGNOSIS — G4719 Other hypersomnia: Secondary | ICD-10-CM

## 2023-07-09 DIAGNOSIS — R0683 Snoring: Secondary | ICD-10-CM

## 2023-07-09 NOTE — Patient Instructions (Addendum)
-  SUSPECTED SLEEP APNEA: Sleep apnea is a condition where breathing repeatedly stops and starts during sleep. We will conduct a home sleep study to determine if you have sleep apnea. Untreated sleep apnea puts you at higher risk for cardiac arrhythmias, pulmonary HTN, stroke and diabetes. Depending on the results, treatment options may include weight loss, changing your sleep position, using an oral appliance, CPAP therapy, or surgery.  Mild OSA 5-15 apneic events an hour Moderate OSA 15-30 apneic events an hour Severe OSA > 30 apneic events an hour  -GASTROESOPHAGEAL REFLUX DISEASE (GERD): GERD is a condition where stomach acid frequently flows back into the tube connecting your mouth and stomach, causing irritation. We will increase your Protonix  dose to twice daily, taken 30 minutes before meals, and evaluate your response over the next 2-4 weeks.  -OBESITY: Obesity is a condition characterized by excessive body fat. You have made significant progress by losing 40 pounds with the help of Ozempic . Continue with your current weight loss efforts. If the initial sleep study shows mild sleep apnea, we may consider a repeat study after you reach your weight loss goal.  Recommendations: Focus on side sleeping position or elevate head with wedge pillow 30 degrees Work on weight loss efforts if able  Do not drive if experiencing excessive daytime sleepiness of fatigue    Orders: Home sleep study re: loud snoring (ordered)   Follow-up: Please call to schedule follow-up 2 weeks after completing home sleep study to review results and treatment if needed (can be virtual)

## 2023-07-09 NOTE — Progress Notes (Signed)
 @Patient  ID: Bonnie Larsen, female    DOB: 1980-08-20, 44 y.o.   MRN: [id number]  No chief complaint on file.   Referring provider: Joane Artist RAMAN, MD  HPI: 43 year old female, never smoked. PMH significant for prediabetes, hodgkin lymphoma, gastric reflux, thyroid  nodules, GAD, obesity.  07/09/2023 Discussed the use of AI scribe software for clinical note transcription with the patient, who gave verbal consent to proceed.  History of Present Illness   The patient, with a medical history of prediabetes, Hodgkin's lymphoma, acid reflux, generalized anxiety disorder, and obesity, presents for a sleep consult due to persistent fatigue. They report difficulty maintaining sleep, often waking up five to six times a night. They describe episodes of waking up choking or gasping for air, followed by coughing fits that can last up to five minutes. These episodes are often accompanied by a sensation of shortness of breath that precedes the coughing.  The patient also reports excessive daytime sleepiness, including recent episodes of drowsiness while driving. They typically go to bed between 7:30-8:30 PM and wake up at 4 AM, estimating a total of five to six hours of sleep per night. They deny any history of seizures, sleepwalking, restless leg symptoms, or other sleep disorders.  The patient's family reports that they snore, but the patient only notices this when they are particularly tired or have allergies. They also report discomfort when lying down due to the weight of their breasts, which they feel may be contributing to their breathing difficulties during sleep.  The patient has been on Protonix  for acid reflux since undergoing chemotherapy. They sleep on an elevated pillow wedge to help manage their reflux symptoms. They have also been diagnosed with two thyroid  nodules and have recently lost 40 pounds with the help of Ozempic . Despite this weight loss, they have not noticed any significant  improvement in their sleep quality.      Sleep questionnaire Symptoms-  possible snoring, waking up gasping/choking, daytime fatigue, non-restorative sleep Prior sleep study- none Bedtime- 8-8:30pm Time to fall asleep- 20-30 mins Nocturnal awakenings- 5-6 times  Out of bed/start of day- 4am Weight changes- down 40 lbs  Do you operate heavy machinery- no Do you currently wear CPAP- no Do you current wear oxygen- no Epworth- 13 Medications- Ozempic      Allergies  Allergen Reactions   Clindamycin /Lincomycin Swelling    FACIAL SWELLING   Morphine     Cymbalta  [Duloxetine  Hcl] Nausea Only    Causes pt to have nausea and stomach cramping.    Immunization History  Administered Date(s) Administered   Influenza,inj,Quad PF,6+ Mos 06/06/2018, 05/31/2021   PFIZER(Purple Top)SARS-COV-2 Vaccination 02/07/2020, 02/26/2020, 02/01/2021   Tdap 04/25/2015    Past Medical History:  Diagnosis Date   Adenopathy 01/10/2017   Upper abdominal. Per CT   Anemia    Anxiety    Bacterial pharyngitis 07/30/2016   Counseling regarding advanced care planning and goals of care 06/24/2017   Depression    Difficult intravenous access    Family history of adverse reaction to anesthesia    pt's mother has hx. of post-op N/V and hard to wake up post-op   History of panic attacks    02/08/17- has not had a panic attacks   Lymphoma (HCC) 01/2017   Mesenteric lymphadenopathy 01/2017   Morbid obesity with BMI of 40.0-44.9, adult (HCC)    Need for immunization against influenza 05/31/2021   Obesity, Class II, BMI 35-39.9, isolated 04/25/2015   Runny nose 02/19/2017  clear drainage, per pt.   Seasonal allergies     Tobacco History: Social History   Tobacco Use  Smoking Status Never  Smokeless Tobacco Never   Counseling given: Not Answered   Outpatient Medications Prior to Visit  Medication Sig Dispense Refill   baclofen  (LIORESAL ) 10 MG tablet Take 1-2 tablets (10-20 mg total) by mouth at  bedtime as needed for muscle spasms. 60 each 12   Cholecalciferol (VITAMIN D3) 1.25 MG (50000 UT) CAPS Take 1 capsule by mouth once a week 12 capsule 0   cyclobenzaprine  (FLEXERIL ) 5 MG tablet Take 1 tablet (5 mg total) by mouth in the morning and at bedtime. For leg cramping. (Patient not taking: Reported on 07/02/2023) 60 tablet 2   fluticasone  (FLONASE ) 50 MCG/ACT nasal spray Place 2 sprays into both nostrils daily.     hydrochlorothiazide  (HYDRODIURIL ) 12.5 MG tablet Take 1 tablet (12.5 mg total) by mouth daily as needed. 90 tablet 1   levonorgestrel  (MIRENA ) 20 MCG/24HR IUD 1 each by Intrauterine route once.     MAGNESIUM PO Take by mouth.     metFORMIN  (GLUCOPHAGE -XR) 500 MG 24 hr tablet Take 1 tablet by mouth in the evening 90 tablet 0   Multiple Vitamin (MULTIVITAMIN) tablet Take 1 tablet by mouth daily.     naproxen  (NAPROSYN ) 500 MG tablet Take 1 tablet (500 mg total) by mouth 2 (two) times daily with a meal. For leg pain. 60 tablet 0   NEEDLE, DISP, 30 G 30G X 1/2 MISC Inject 1 each as directed daily. 50 each 0   ondansetron  (ZOFRAN ) 4 MG tablet TAKE 1 TABLET BY MOUTH EVERY 8 HOURS AS NEEDED FOR NAUSEA OR VOMITING 20 tablet 0   pantoprazole  (PROTONIX ) 20 MG tablet TAKE 1 TABLET BY MOUTH TWICE DAILY FOR  1  WEEK,  THEN  1  TABLET  EVERY  MORNING. 60 tablet 0   phentermine  (ADIPEX-P ) 37.5 MG tablet Take 0.5 tablets (18.75 mg total) by mouth daily after breakfast. 30 tablet 0   POTASSIUM PO Take by mouth.     Semaglutide , 2 MG/DOSE, 8 MG/3ML SOPN Inject 2 mg as directed once a week. 3 mL 5   Facility-Administered Medications Prior to Visit  Medication Dose Route Frequency Provider Last Rate Last Admin   sodium chloride  flush (NS) 0.9 % injection 10 mL  10 mL Intravenous PRN Kale, Gautam Kishore, MD   10 mL at 04/16/17 1938   Review of Systems  Review of Systems  Constitutional:  Positive for fatigue.  Respiratory:  Positive for cough. Negative for shortness of breath.    Psychiatric/Behavioral:  Positive for sleep disturbance.      Physical Exam  There were no vitals taken for this visit. Physical Exam Constitutional:      General: She is not in acute distress.    Appearance: Normal appearance. She is not ill-appearing.  HENT:     Head: Normocephalic and atraumatic.     Mouth/Throat:     Mouth: Mucous membranes are moist.     Pharynx: Oropharynx is clear.     Comments: Mallampati class III Cardiovascular:     Rate and Rhythm: Normal rate and regular rhythm.  Pulmonary:     Effort: Pulmonary effort is normal.     Breath sounds: Normal breath sounds.     Comments: Reactive cough, lungs CTA Musculoskeletal:        General: Normal range of motion.  Skin:    General: Skin is warm and dry.  Neurological:     General: No focal deficit present.     Mental Status: She is alert and oriented to person, place, and time. Mental status is at baseline.  Psychiatric:        Mood and Affect: Mood normal.        Behavior: Behavior normal.        Thought Content: Thought content normal.        Judgment: Judgment normal.      Lab Results:  CBC    Component Value Date/Time   WBC 5.4 03/18/2023 1205   WBC 5.6 01/23/2022 1413   RBC 5.48 (H) 03/18/2023 1205   HGB 14.1 03/18/2023 1205   HGB 12.3 06/19/2017 0912   HCT 43.7 03/18/2023 1205   HCT 37.6 06/19/2017 0912   PLT 244 03/18/2023 1205   PLT 257 06/19/2017 0912   MCV 79.7 (L) 03/18/2023 1205   MCV 83.6 06/19/2017 0912   MCH 25.7 (L) 03/18/2023 1205   MCHC 32.3 03/18/2023 1205   RDW 13.8 03/18/2023 1205   RDW 16.5 (H) 06/19/2017 0912   LYMPHSABS 1.4 03/18/2023 1205   LYMPHSABS 0.8 (L) 06/19/2017 0912   MONOABS 0.4 03/18/2023 1205   MONOABS 0.7 06/19/2017 0912   EOSABS 0.1 03/18/2023 1205   EOSABS 0.0 06/19/2017 0912   BASOSABS 0.0 03/18/2023 1205   BASOSABS 0.0 06/19/2017 0912    BMET    Component Value Date/Time   NA 139 04/25/2023 1350   NA 141 06/19/2017 0912   K 4.4 04/25/2023  1350   K 3.9 06/19/2017 0912   CL 101 04/25/2023 1350   CO2 29 04/25/2023 1350   CO2 25 06/19/2017 0912   GLUCOSE 89 04/25/2023 1350   GLUCOSE 90 06/19/2017 0912   BUN 9 04/25/2023 1350   BUN 10.7 06/19/2017 0912   CREATININE 1.05 04/25/2023 1350   CREATININE 1.15 (H) 03/18/2023 1205   CREATININE 0.9 06/19/2017 0912   CALCIUM 9.9 04/25/2023 1350   CALCIUM 9.5 06/19/2017 0912   GFRNONAA >60 03/18/2023 1205   GFRAA >60 12/24/2019 1230    BNP No results found for: BNP  ProBNP No results found for: PROBNP  Imaging: US  THYROID  Result Date: 07/04/2023 CLINICAL DATA:  Thyroid  nodule seen on recent CT EXAM: THYROID  ULTRASOUND TECHNIQUE: Ultrasound examination of the thyroid  gland and adjacent soft tissues was performed. COMPARISON:  CT chest abdomen pelvis 06/27/2023 FINDINGS: Parenchymal Echotexture: Mildly heterogenous Isthmus: 0.3 cm Right lobe: 5.3 x 1.8 x 2.0 cm Left lobe: 5.1 x 1.4 x 1.6 cm _________________________________________________________ Estimated total number of nodules >/= 1 cm: 2 Number of spongiform nodules >/=  2 cm not described below (TR1): 0 Number of mixed cystic and solid nodules >/= 1.5 cm not described below (TR2): 0 _________________________________________________________ Nodule # 1: Location: Right; superior Maximum size: 2.3 cm; Other 2 dimensions: 1.4 x 1.4 cm Composition: mixed cystic and solid (1) Echogenicity: isoechoic (1) Shape: not taller-than-wide (0) Margins: ill-defined (0) Echogenic foci: none (0) ACR TI-RADS total points: 2. ACR TI-RADS risk category: TR2 (2 points). ACR TI-RADS recommendations: This nodule does NOT meet TI-RADS criteria for biopsy or dedicated follow-up. This nodule corresponds to the abnormality seen on recent CT. _________________________________________________________ Nodule # 2: Location: Right; inferior Maximum size: 1.0 cm; Other 2 dimensions: 0.8 x 0.9 cm Composition: solid/almost completely solid (2) Echogenicity: hypoechoic  (2) Shape: not taller-than-wide (0) Margins: smooth (0) Echogenic foci: none (0) ACR TI-RADS total points: 4. ACR TI-RADS risk category: TR4 (4-6 points). ACR TI-RADS recommendations: *  Given size (>/= 1 - 1.4 cm) and appearance, a follow-up ultrasound in 1 year should be considered based on TI-RADS criteria. _________________________________________________________ 0.5 cm solid hypoechoic left mid thyroid  nodule does not meet criteria for imaging surveillance or FNA. IMPRESSION: Nodule 2 (TI-RADS 4), measuring 1.0 cm, located in the inferior right thyroid  lobe meets criteria for imaging follow-up. Annual ultrasound surveillance is recommended until 5 years of stability is documented. The above is in keeping with the ACR TI-RADS recommendations - J Am Coll Radiol 2017;14:587-595. Electronically Signed   By: Aliene Lloyd M.D.   On: 07/04/2023 19:41   CT CHEST ABDOMEN PELVIS W CONTRAST Result Date: 06/27/2023 CLINICAL DATA:  History of hematologic malignancy, increased fatigue. 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 * Tracking Code: BO * EXAM: CT CHEST, ABDOMEN, AND PELVIS WITH CONTRAST TECHNIQUE: Multidetector CT imaging of the chest, abdomen and pelvis was performed following the standard protocol during bolus administration of intravenous contrast. RADIATION DOSE REDUCTION: This exam was performed according to the departmental dose-optimization program which includes automated exposure control, adjustment of the mA and/or kV according to patient size and/or use of iterative reconstruction technique. CONTRAST:  OMNIPAQUE  IOHEXOL  300 MG/ML  SOLN COMPARISON:  Multiple priors including CT June 09, 2021 and May 24, 2020 FINDINGS: CT CHEST FINDINGS Cardiovascular: No significant vascular findings. Normal heart size. No pericardial effusion. Mediastinum/Nodes: Similar to slightly decreased size of prominent/mildly enlarged axillary and mediastinal lymph nodes. For  reference: -right paratracheal lymph node measures 6 mm in short axis on image 19/2, previously 8 mm. -lymph node adjacent to the distal descending thoracic aorta measures 8 mm in short axis on image 45/2 previously 11 mm. No new or enlarging lymph nodes identified in the chest. Heterogeneous 16 mm nodule in the right lobe of the thyroid  is new from prior. Lungs/Pleura: Stable tiny 3 mm pulmonary nodule in the anterior right upper lobe on image 55/4 and 2 mm nodule in the right lung apex on image 16/4, compatible with a benign finding. No new suspicious pulmonary nodules or masses. No pleural effusion. No pneumothorax. Musculoskeletal: No aggressive lytic or blastic lesion of bone. Soft tissue mass in the upper central right breast measures 3.1 x 1.1 cm on image 40/2 previously 4.1 x 2.1 cm. CT ABDOMEN PELVIS FINDINGS Hepatobiliary: No suspicious hepatic lesion. Gallbladder is unremarkable. No biliary ductal dilation. Pancreas: No pancreatic ductal dilation or evidence of acute inflammation. Spleen: No splenomegaly. Adrenals/Urinary Tract: Bilateral adrenal glands appear normal. No hydronephrosis. Kidneys demonstrate symmetric enhancement. Urinary bladder is unremarkable for degree of distension. Stomach/Bowel: Stomach is within normal limits. No evidence of bowel wall thickening, distention, or inflammatory changes. Vascular/Lymphatic: Normal caliber abdominal aorta. The portal, splenic and superior mesenteric veins are patent. Enlarged retroperitoneal and mesenteric lymph nodes are similar prior. For reference: -Gastrohepatic ligament lymph nodes measure up to 9 mm, unchanged. -lymph node anterior to the SMA measures 18 mm in short axis on image 17/2 previously 17 mm. Reproductive: Intrauterine device in place. No suspicious adnexal mass. Other: No aggressive lytic or blastic lesion of bone. Musculoskeletal: No aggressive lytic or blastic lesion of bone. Multilevel degenerative changes spine. IMPRESSION: 1.  Similar to slightly decreased size of lymph nodes above the diaphragm with similar lymphadenopathy below the diaphragm. No new pathologically enlarged or enlarging lymph nodes identified above or below the diaphragm. 2. No splenomegaly. 3. Soft tissue mass in the upper central right breast measures 3.1 x 1.1 cm previously 4.1 x 2.1 cm. 4.  Heterogeneous 16 mm nodule in the right lobe of the thyroid  is new from prior, suggest further evaluation with thyroid  ultrasound. Electronically Signed   By: Reyes Holder M.D.   On: 06/27/2023 14:50     Assessment & Plan:   1. Excessive daytime sleepiness (Primary) - Home sleep test; Future  2. Snoring - Home sleep test; Future     Suspected Sleep Apnea Reports of non-restorative sleep, daytime drowsiness, and nocturnal choking episodes suggestive of apneic periods. Obesity and reported snoring increase patients risk for OSA. -Order home sleep study to assess for sleep disordered breathing. -Reviewed risks of untreated sleep apnea and discussed potential treatment options including weight loss, positional sleep, oral appliance, CPAP, and surgical options.  Gastroesophageal Reflux Disease (GERD) Chronic cough and nocturnal choking episodes could be related to reflux. Currently on Protonix  once daily. -Increase Protonix  to twice daily, taken 30 minutes before meals. -Evaluate response to increased Protonix  dose over next 2-4 weeks.  Obesity Currently on Ozempic  for weight loss, has lost 40 pounds. -Continue Ozempic  and weight loss efforts. -Consider repeat sleep study after reaching weight loss goal if initial sleep study shows mild sleep apnea.  Follow-up after sleep study results are available to discuss further management.      Almarie LELON Ferrari, NP 07/09/2023

## 2023-07-15 ENCOUNTER — Other Ambulatory Visit: Payer: Self-pay

## 2023-07-15 DIAGNOSIS — C8108 Nodular lymphocyte predominant Hodgkin lymphoma, lymph nodes of multiple sites: Secondary | ICD-10-CM

## 2023-07-16 ENCOUNTER — Inpatient Hospital Stay: Payer: 59 | Attending: Hematology | Admitting: Hematology

## 2023-07-16 ENCOUNTER — Ambulatory Visit: Payer: 59 | Admitting: Hematology

## 2023-07-16 ENCOUNTER — Other Ambulatory Visit: Payer: 59

## 2023-07-16 ENCOUNTER — Other Ambulatory Visit: Payer: Self-pay | Admitting: Family

## 2023-07-16 DIAGNOSIS — E041 Nontoxic single thyroid nodule: Secondary | ICD-10-CM

## 2023-07-16 DIAGNOSIS — C8108 Nodular lymphocyte predominant Hodgkin lymphoma, lymph nodes of multiple sites: Secondary | ICD-10-CM | POA: Diagnosis not present

## 2023-07-16 DIAGNOSIS — K2960 Other gastritis without bleeding: Secondary | ICD-10-CM

## 2023-07-16 MED ORDER — PANTOPRAZOLE SODIUM 20 MG PO TBEC: 20.0000 mg | DELAYED_RELEASE_TABLET | Freq: Every day | ORAL | 0 refills | Status: DC

## 2023-07-16 NOTE — Progress Notes (Signed)
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  PREVIOUS THERAPY:    R-CHOP x 6 cycles (Fanale et al 2010)   INTERVAL HISTORY:  Patient is seen in follow-up for her nodular lymphocyte predominant Hodgkin's lymphoma.  Patient was last seen by me on 03/18/2023 and she complained of bilateral leg cramps and occasional restless leg syndrome.   .I connected with Bonnie Larsen on01/21/2025 at  3:00 PM EST by telephone visit and verified that I am speaking with the correct person using two identifiers.   Patient notes she has been doing fairly well since our last visit. She does complain of fatigue, lethargy, and insomnia. She reports of being tired throughout the day and day-time sleepiness She has been to sleep specialist and has suggested sleep study at home.   She has been taking Vitamin D-3 and B-12 supplement. Patient continues to be on ozempic.  She denies any new infection issues, fever, chills, unexpected weight loss, back pain, chest pain, abdominal pain, or leg swelling. She denies depression or anxiety. Denies waking up in middle of night with SOB.  She does complain of night sweats and hot flashes, which she contributes to menopausal symptoms.   Patient tries to walk at least 30 minutes everyday.   Patient takes Baclofen once daily and twice as needed for her leg cramps. She notes that Baclofen does improve her leg cramps. She has been to a sports medicine physician regarding leg cramps.   I discussed the limitations, risks, security and privacy concerns of performing an evaluation and management  service by telemedicine and the availability of in-person appointments. I also discussed with the patient that there may be a patient responsible charge related to this service. The patient expressed understanding and agreed to proceed.   Other persons participating in the visit and their role in the encounter: None   Patient's location: Home  Provider's location: [facility]   Chief Complaint: nodular lymphocyte predominant Hodgkin's lymphoma    MEDICAL HISTORY:  Past Medical History:  Diagnosis Date   Adenopathy 01/10/2017   Upper abdominal. Per CT   Anemia    Anxiety    Bacterial pharyngitis 07/30/2016   Counseling regarding advanced care planning and goals of care 06/24/2017   Depression    Difficult intravenous access    Family history of adverse reaction to anesthesia    pt's mother has hx. 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   Seasonal allergies     SURGICAL HISTORY: Past Surgical History:  Procedure Laterality Date   LYMPH NODE BIOPSY N/A 02/11/2017   Procedure: HAND ASSISTED LAPAROSCOPIC LYMPH NODE BIOPSY;  Surgeon: Almond Lint, MD;  Location: [facility] OR;  Service: General;  Laterality: N/A;   PORT-A-CATH REMOVAL N/A 09/04/2017   Procedure: REMOVAL PORT-A-CATH;  Surgeon: Almond Lint, MD;  Location: [facility] OR;  Service: General;  Laterality: N/A;   PORTACATH PLACEMENT N/A 02/20/2017   Procedure: INSERTION PORT-A-CATH;  Surgeon: Almond Lint, MD;  Location: [facility];  Service: General;  Laterality: N/A;   TUBAL LIGATION     UMBILICAL HERNIA REPAIR  02/11/2017    SOCIAL HISTORY: Social History   Socioeconomic History    Marital status: Married    Spouse name: Chrissie Noa   Number of children: 2   Years of education: Not on file   Highest education level: Associate degree: occupational, Scientist, product/process development, or vocational program  Occupational History   Not on file  Tobacco Use   Smoking status: Never   Smokeless tobacco: Never  Vaping Use   Vaping status: Never Used  Substance and Sexual Activity   Alcohol use: No    Alcohol/week: 0.0 standard drinks of alcohol   Drug use: No   Sexual activity: Yes    Partners: Male    Birth control/protection: I.U.D.    Comment: tubal ligation  Other Topics Concern   Not on file  Social History Narrative   Not on file   Social Drivers of Health   Financial Resource Strain: Low Risk  (09/03/2017)   Overall Financial Resource Strain (CARDIA)    Difficulty of Paying Living Expenses: Not hard at all  Food Insecurity: No Food Insecurity (09/03/2017)   Hunger Vital Sign    Worried About Running Out of Food in the Last Year: Never true    Ran Out of Food in the Last Year: Never true  Transportation Needs: No Transportation Needs (09/03/2017)   PRAPARE - Administrator, Civil Service (Medical): No    Lack of Transportation (Non-Medical): No  Physical Activity: Inactive (09/03/2017)   Exercise Vital Sign    Days of Exercise per Week: 0 days    Minutes of Exercise per Session: 0 min  Stress: Stress Concern Present (09/03/2017)   [employer] of Occupational Health - Occupational Stress Questionnaire    Feeling of Stress : Rather much  Social Connections: Somewhat Isolated (09/03/2017)   Social Connection and Isolation Panel [NHANES]    Frequency of Communication with Friends and Family: More than three times a week    Frequency of Social Gatherings with Friends and Family: Never    Attends Religious Services: Never    Database administrator or Organizations: No    Attends Banker Meetings: Never    Marital Status: Married  Catering manager  Violence: Not At Risk (09/03/2017)   Humiliation, Afraid, Rape, and Kick questionnaire    Fear of Current or Ex-Partner: No    Emotionally Abused: No    Physically Abused: No    Sexually Abused: No    FAMILY HISTORY: Family History  Problem Relation Age of Onset   Cancer Mother    Arthritis Mother    Breast cancer Mother 47   Fibromyalgia Mother    Rheum arthritis Mother    Neuropathy Mother    Anesthesia problems Mother        post-op N/V; hard to wake up post-op   Early death Father    Diabetes Father    COPD Father    Alcohol abuse Father    Congestive Heart Failure Father        died at age 55   Cirrhosis Father    Other Brother        Benign spinal tumor   Heart disease  Maternal Grandmother    Diabetes Maternal Grandmother    Cancer Paternal Grandfather        Leukemia    ALLERGIES:  is allergic to clindamycin/lincomycin, morphine, and cymbalta [duloxetine hcl].  MEDICATIONS:  Current Outpatient Medications  Medication Sig Dispense Refill   baclofen (LIORESAL) 10 MG tablet Take 1-2 tablets (10-20 mg total) by mouth at bedtime as needed for muscle spasms. 60 each 12   Cholecalciferol (VITAMIN D3) 1.25 MG (50000 UT) CAPS Take 1 capsule by mouth once a week 12 capsule 0   fluticasone (FLONASE) 50 MCG/ACT nasal spray Place 2 sprays into both nostrils daily.     hydrochlorothiazide (HYDRODIURIL) 12.5 MG tablet Take 1 tablet (12.5 mg total) by mouth daily as needed. 90 tablet 1   levonorgestrel (MIRENA) 20 MCG/24HR IUD 1 each by Intrauterine route once.     MAGNESIUM PO Take by mouth.     metFORMIN (GLUCOPHAGE-XR) 500 MG 24 hr tablet Take 1 tablet by mouth in the evening 90 tablet 0   Multiple Vitamin (MULTIVITAMIN) tablet Take 1 tablet by mouth daily.     naproxen (NAPROSYN) 500 MG tablet Take 1 tablet (500 mg total) by mouth 2 (two) times daily with a meal. For leg pain. 60 tablet 0   NEEDLE, DISP, 30 G 30G X 1/2" MISC Inject 1 each as directed daily. 50 each 0    ondansetron (ZOFRAN) 4 MG tablet TAKE 1 TABLET BY MOUTH EVERY 8 HOURS AS NEEDED FOR NAUSEA OR VOMITING 20 tablet 0   pantoprazole (PROTONIX) 20 MG tablet Take 1 tablet (20 mg total) by mouth daily. 60 tablet 0   phentermine (ADIPEX-P) 37.5 MG tablet Take 0.5 tablets (18.75 mg total) by mouth daily after breakfast. 30 tablet 0   POTASSIUM PO Take by mouth.     Semaglutide, 2 MG/DOSE, 8 MG/3ML SOPN Inject 2 mg as directed once a week. 3 mL 5   No current facility-administered medications for this visit.   Facility-Administered Medications Ordered in Other Visits  Medication Dose Route Frequency Provider Last Rate Last Admin   sodium chloride flush (NS) 0.9 % injection 10 mL  10 mL Intravenous PRN Johney Maine, MD   10 mL at 04/16/17 1938    REVIEW OF SYSTEMS:   10 Point review of Systems was done is negative except as noted above.   This is a PHYSICAL EXAMINATION: TELE-MED VISIT  LABORATORY DATA:  I have reviewed the data as listed     Latest Ref Rng & Units 03/18/2023   12:05 PM 07/27/2022   12:53 PM 01/23/2022    2:13 PM  CBC  WBC 4.0 - 10.5 K/uL 5.4  5.4  5.6   Hemoglobin 12.0 - 15.0 g/dL 91.4  78.2  95.6   Hematocrit 36.0 - 46.0 % 43.7  42.7  43.6   Platelets 150 - 400 K/uL 244  260  255       Latest Ref Rng & Units 04/25/2023    1:50 PM 03/18/2023   12:05 PM 07/27/2022   12:53 PM  CMP  Glucose 70 - 99 mg/dL 89  213  79   BUN 6 - 23 mg/dL 9  13  13    Creatinine 0.40 - 1.20 mg/dL 0.86  5.78  4.69   Sodium 135 - 145 mEq/L 139  139  139   Potassium 3.5 - 5.1 mEq/L 4.4  4.1  4.1   Chloride 96 - 112 mEq/L 101  103  102  CO2 19 - 32 mEq/L 29  27  31    Calcium 8.4 - 10.5 mg/dL 9.9  9.6  57.8   Total Protein 6.5 - 8.1 g/dL  7.3  6.8   Total Bilirubin 0.3 - 1.2 mg/dL  0.6  0.5   Alkaline Phos 38 - 126 U/L  57  42   AST 15 - 41 U/L  15  13   ALT 0 - 44 U/L  12  8    . Lab Results  Component Value Date   LDH 114 03/18/2023    Lymphnode biopsy,  02/11/2017 Diagnosis Lymph node for lymphoma, Mesenteric Lymphadenopathy - HODGKIN LYMPHOMA.         PROCEDURES  ECHO 03/13/17 Study Conclusions  - Left ventricle: The cavity size was normal. Systolic function was  normal. The estimated ejection fraction was in the range of 55%  to 60%. Wall motion was normal; there were no regional wall  motion abnormalities. Left ventricular diastolic function  parameters were normal. - Atrial septum: No defect or patent foramen ovale was identified. - Impressions: Normal GLS -20.2.   RADIOGRAPHIC STUDIES: I have personally reviewed the radiological images as listed and agreed with the findings in the report. US THYROID Result Date: 07/04/2023 CLINICAL DATA:  Thyroid nodule seen on recent CT EXAM: THYROID ULTRASOUND TECHNIQUE: Ultrasound examination of the thyroid gland and adjacent soft tissues was performed. COMPARISON:  CT chest abdomen pelvis 06/27/2023 FINDINGS: Parenchymal Echotexture: Mildly heterogenous Isthmus: 0.3 cm Right lobe: 5.3 x 1.8 x 2.0 cm Left lobe: 5.1 x 1.4 x 1.6 cm _________________________________________________________ Estimated total number of nodules >/= 1 cm: 2 Number of spongiform nodules >/=  2 cm not described below (TR1): 0 Number of mixed cystic and solid nodules >/= 1.5 cm not described below (TR2): 0 _________________________________________________________ Nodule # 1: Location: Right; superior Maximum size: 2.3 cm; Other 2 dimensions: 1.4 x 1.4 cm Composition: mixed cystic and solid (1) Echogenicity: isoechoic (1) Shape: not taller-than-wide (0) Margins: ill-defined (0) Echogenic foci: none (0) ACR TI-RADS total points: 2. ACR TI-RADS risk category: TR2 (2 points). ACR TI-RADS recommendations: This nodule does NOT meet TI-RADS criteria for biopsy or dedicated follow-up. This nodule corresponds to the abnormality seen on recent CT. _________________________________________________________ Nodule # 2: Location: Right; inferior  Maximum size: 1.0 cm; Other 2 dimensions: 0.8 x 0.9 cm Composition: solid/almost completely solid (2) Echogenicity: hypoechoic (2) Shape: not taller-than-wide (0) Margins: smooth (0) Echogenic foci: none (0) ACR TI-RADS total points: 4. ACR TI-RADS risk category: TR4 (4-6 points). ACR TI-RADS recommendations: *Given size (>/= 1 - 1.4 cm) and appearance, a follow-up ultrasound in 1 year should be considered based on TI-RADS criteria. _________________________________________________________ 0.5 cm solid hypoechoic left mid thyroid nodule does not meet criteria for imaging surveillance or FNA. IMPRESSION: Nodule 2 (TI-RADS 4), measuring 1.0 cm, located in the inferior right thyroid lobe meets criteria for imaging follow-up. Annual ultrasound surveillance is recommended until 5 years of stability is documented. The above is in keeping with the ACR TI-RADS recommendations - J Am Coll Radiol 2017;14:587-595. Electronically Signed   By: Acquanetta Belling M.D.   On: 07/04/2023 19:41   CT CHEST ABDOMEN PELVIS W CONTRAST Result Date: 06/27/2023 CLINICAL DATA:  History of hematologic malignancy, increased fatigue. 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 * Tracking Code: BO * EXAM: CT CHEST, ABDOMEN, AND PELVIS WITH CONTRAST TECHNIQUE: Multidetector CT imaging of the chest, abdomen and pelvis was performed following the standard protocol during bolus administration  of intravenous contrast. RADIATION DOSE REDUCTION: This exam was performed according to the departmental dose-optimization program which includes automated exposure control, adjustment of the mA and/or kV according to patient size and/or use of iterative reconstruction technique. CONTRAST:  OMNIPAQUE IOHEXOL 300 MG/ML  SOLN COMPARISON:  Multiple priors including CT June 09, 2021 and May 24, 2020 FINDINGS: CT CHEST FINDINGS Cardiovascular: No significant vascular findings. Normal heart size. No pericardial effusion.  Mediastinum/Nodes: Similar to slightly decreased size of prominent/mildly enlarged axillary and mediastinal lymph nodes. For reference: -right paratracheal lymph node measures 6 mm in short axis on image 19/2, previously 8 mm. -lymph node adjacent to the distal descending thoracic aorta measures 8 mm in short axis on image 45/2 previously 11 mm. No new or enlarging lymph nodes identified in the chest. Heterogeneous 16 mm nodule in the right lobe of the thyroid is new from prior. Lungs/Pleura: Stable tiny 3 mm pulmonary nodule in the anterior right upper lobe on image 55/4 and 2 mm nodule in the right lung apex on image 16/4, compatible with a benign finding. No new suspicious pulmonary nodules or masses. No pleural effusion. No pneumothorax. Musculoskeletal: No aggressive lytic or blastic lesion of bone. Soft tissue mass in the upper central right breast measures 3.1 x 1.1 cm on image 40/2 previously 4.1 x 2.1 cm. CT ABDOMEN PELVIS FINDINGS Hepatobiliary: No suspicious hepatic lesion. Gallbladder is unremarkable. No biliary ductal dilation. Pancreas: No pancreatic ductal dilation or evidence of acute inflammation. Spleen: No splenomegaly. Adrenals/Urinary Tract: Bilateral adrenal glands appear normal. No hydronephrosis. Kidneys demonstrate symmetric enhancement. Urinary bladder is unremarkable for degree of distension. Stomach/Bowel: Stomach is within normal limits. No evidence of bowel wall thickening, distention, or inflammatory changes. Vascular/Lymphatic: Normal caliber abdominal aorta. The portal, splenic and superior mesenteric veins are patent. Enlarged retroperitoneal and mesenteric lymph nodes are similar prior. For reference: -Gastrohepatic ligament lymph nodes measure up to 9 mm, unchanged. -lymph node anterior to the SMA measures 18 mm in short axis on image 17/2 previously 17 mm. Reproductive: Intrauterine device in place. No suspicious adnexal mass. Other: No aggressive lytic or blastic lesion of bone.  Musculoskeletal: No aggressive lytic or blastic lesion of bone. Multilevel degenerative changes spine. IMPRESSION: 1. Similar to slightly decreased size of lymph nodes above the diaphragm with similar lymphadenopathy below the diaphragm. No new pathologically enlarged or enlarging lymph nodes identified above or below the diaphragm. 2. No splenomegaly. 3. Soft tissue mass in the upper central right breast measures 3.1 x 1.1 cm previously 4.1 x 2.1 cm. 4. Heterogeneous 16 mm nodule in the right lobe of the thyroid is new from prior, suggest further evaluation with thyroid ultrasound. Electronically Signed   By: Maudry Mayhew M.D.   On: 06/27/2023 14:50      ASSESSMENT & PLAN:   43 y.o. 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 Previously this was Deauville 5.  S/p R-CHOP x 6 cycles  PET/CT 08/06/2017: No evidence active lymphoma with minimal activity within the remaining periaortic and mesenteric lymph nodes - Deauville 2  2) Epigastric abdominal discomfort Significantly improved with PPI therapy.  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 Showed Similar to slightly decreased size of lymph nodes above the diaphragm with similar lymphadenopathy below the diaphragm. No new pathologically enlarged or enlarging lymph nodes identified above or below the diaphragm. No splenomegaly. Soft tissue mass in the upper central right breast measures 3.1 x 1.1 cm previously 4.1 x 2.1 cm. Heterogeneous 16 mm nodule in the right lobe of the thyroid. -Discussed US Thyroid results from 07/04/2023 in detail with the patient. Showed Nodule 2 (TI-RADS 4), measuring 1.0 cm, located in the inferior right thyroid lobe meets criteria for imaging follow-up. Annual ultrasound surveillance is recommended until 5 years of stability is documented. -Discussed with the patient that Ozempic could cause fatigue and low energy.  -Continue to follow-up with PCP.  -Answered all of patient's questions regarding CT scan results and US Thyroid.   -Continue Magnesium and Potassium supplements.  - no lab, or clinical evidence of HL progression at this time. -Continue iron supplement.  -Patient had questions regarding bio-identical replacement, which were answered.  -Discussed with the patient that the Thyroid nodule does not look like it is related to Hodgkin's Lymphoma.  -Discussed with the patient that her symptoms could be related to perimenopause.  -Discussed with the patient that thyroid nodule related to lymphoma would be very unlikely.  -Will refer the patient to endocrinologist.    FOLLOW-UP: Referral to endocrinology for evaluation and and mx of thyroid nodules RTC with Dr Candise Che with labs in 12 months  The total time spent in the appointment was 20 minutes* .  All of the patient's questions were answered with  apparent satisfaction. The patient knows to call the clinic with any problems, questions or concerns.   Wyvonnia Lora MD MS AAHIVMS [facility] Hematology/Oncology Physician [facility]  .*Total Encounter Time as defined by the Centers for Medicare and Medicaid Services includes, in addition to the face-to-face time of a patient visit (documented in the note above) non-face-to-face time: obtaining and reviewing outside history, ordering and reviewing medications, tests or procedures, care coordination (communications with other health care professionals or caregivers) and documentation in the medical record.   I,Param Shah,acting as a Neurosurgeon for Wyvonnia Lora, MD.,have documented all relevant documentation on the behalf of Wyvonnia Lora, MD,as directed by  Wyvonnia Lora, MD while in the presence of Wyvonnia Lora, MD.  .I have reviewed the above documentation for accuracy and completeness, and I agree with the above. Johney Maine MD

## 2023-07-16 NOTE — Progress Notes (Incomplete)
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  PREVIOUS THERAPY:    R-CHOP x 6 cycles (Fanale et al 2010)   INTERVAL HISTORY:  Patient is seen in follow-up for her nodular lymphocyte predominant Hodgkin's lymphoma.  Patient was last seen by me on 03/18/2023 and she complained of bilateral leg cramps, occasional restless leg syndrome at night.    -Discussed lab results from today, 07/16/2023, in detail with the patient.  -Discussed CT Chest/Abdomen/Pelvis results from 06/27/2023 in detail with the patient. Showed Similar to slightly decreased size of lymph nodes above the diaphragm with similar lymphadenopathy below the diaphragm. No new pathologically enlarged or enlarging lymph nodes identified above or below the diaphragm. No splenomegaly. Soft tissue mass in the upper central right breast measures 3.1 x 1.1 cm previously 4.1 x 2.1 cm. Heterogeneous 16 mm nodule in the right lobe of the thyroid. -Discussed US Thyroid results from 07/03/2023 in detail with the patient. Showed Nodule 2 (TI-RADS 4), measuring 1.0 cm, located in the inferior right thyroid lobe meets criteria for imaging follow-up. Annual ultrasound surveillance is recommended until 5 years of stability is documented.   MEDICAL HISTORY:  Past Medical History:  Diagnosis Date   Adenopathy 01/10/2017   Upper abdominal. Per CT   Anemia    Anxiety    Bacterial pharyngitis 07/30/2016   Counseling regarding advanced care planning and goals of care 06/24/2017   Depression    Difficult intravenous access    Family history of adverse reaction to  anesthesia    pt's mother has hx. 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   Seasonal allergies     SURGICAL HISTORY: Past Surgical History:  Procedure Laterality Date   LYMPH NODE BIOPSY N/A 02/11/2017   Procedure: HAND ASSISTED LAPAROSCOPIC LYMPH NODE BIOPSY;  Surgeon: Almond Lint, MD;  Location: [facility] OR;  Service: General;  Laterality: N/A;   PORT-A-CATH REMOVAL N/A 09/04/2017   Procedure: REMOVAL PORT-A-CATH;  Surgeon: Almond Lint, MD;  Location: [facility] OR;  Service: General;  Laterality: N/A;   PORTACATH PLACEMENT N/A 02/20/2017   Procedure: INSERTION PORT-A-CATH;  Surgeon: Almond Lint, MD;  Location:  SURGERY CENTER;  Service: General;  Laterality: N/A;   TUBAL LIGATION     UMBILICAL HERNIA REPAIR  02/11/2017    SOCIAL HISTORY: Social History   Socioeconomic History   Marital status: Married    Spouse name: Chrissie Noa   Number of children: 2   Years of education: Not on file   Highest education level: Associate degree: occupational, Scientist, product/process development, or vocational program  Occupational History   Not on file  Tobacco Use   Smoking status: Never   Smokeless tobacco: Never  Vaping Use   Vaping status: Never Used  Substance and Sexual Activity   Alcohol use: No  Alcohol/week: 0.0 standard drinks of alcohol   Drug use: No   Sexual activity: Yes    Partners: Male    Birth control/protection: I.U.D.    Comment: tubal ligation  Other Topics Concern   Not on file  Social History Narrative   Not on file   Social Drivers of Health   Financial Resource Strain: Low Risk  (09/03/2017)   Overall Financial Resource Strain (CARDIA)     Difficulty of Paying Living Expenses: Not hard at all  Food Insecurity: No Food Insecurity (09/03/2017)   Hunger Vital Sign    Worried About Running Out of Food in the Last Year: Never true    Ran Out of Food in the Last Year: Never true  Transportation Needs: No Transportation Needs (09/03/2017)   PRAPARE - Administrator, Civil Service (Medical): No    Lack of Transportation (Non-Medical): No  Physical Activity: Inactive (09/03/2017)   Exercise Vital Sign    Days of Exercise per Week: 0 days    Minutes of Exercise per Session: 0 min  Stress: Stress Concern Present (09/03/2017)   [employer] of Occupational Health - Occupational Stress Questionnaire    Feeling of Stress : Rather much  Social Connections: Somewhat Isolated (09/03/2017)   Social Connection and Isolation Panel [NHANES]    Frequency of Communication with Friends and Family: More than three times a week    Frequency of Social Gatherings with Friends and Family: Never    Attends Religious Services: Never    Database administrator or Organizations: No    Attends Banker Meetings: Never    Marital Status: Married  Catering manager Violence: Not At Risk (09/03/2017)   Humiliation, Afraid, Rape, and Kick questionnaire    Fear of Current or Ex-Partner: No    Emotionally Abused: No    Physically Abused: No    Sexually Abused: No    FAMILY HISTORY: Family History  Problem Relation Age of Onset   Cancer Mother    Arthritis Mother    Breast cancer Mother 52   Fibromyalgia Mother    Rheum arthritis Mother    Neuropathy Mother    Anesthesia problems Mother        post-op N/V; hard to wake up post-op   Early death Father    Diabetes Father    COPD Father    Alcohol abuse Father    Congestive Heart Failure Father        died at age 26   Cirrhosis Father    Other Brother        Benign spinal tumor   Heart disease Maternal Grandmother    Diabetes Maternal Grandmother    Cancer Paternal  Grandfather        Leukemia    ALLERGIES:  is allergic to clindamycin/lincomycin, morphine, and cymbalta [duloxetine hcl].  MEDICATIONS:  Current Outpatient Medications  Medication Sig Dispense Refill   baclofen (LIORESAL) 10 MG tablet Take 1-2 tablets (10-20 mg total) by mouth at bedtime as needed for muscle spasms. 60 each 12   Cholecalciferol (VITAMIN D3) 1.25 MG (50000 UT) CAPS Take 1 capsule by mouth once a week 12 capsule 0   fluticasone (FLONASE) 50 MCG/ACT nasal spray Place 2 sprays into both nostrils daily.     hydrochlorothiazide (HYDRODIURIL) 12.5 MG tablet Take 1 tablet (12.5 mg total) by mouth daily as needed. 90 tablet 1   levonorgestrel (MIRENA) 20 MCG/24HR IUD 1 each by Intrauterine route once.  MAGNESIUM PO Take by mouth.     metFORMIN (GLUCOPHAGE-XR) 500 MG 24 hr tablet Take 1 tablet by mouth in the evening 90 tablet 0   Multiple Vitamin (MULTIVITAMIN) tablet Take 1 tablet by mouth daily.     naproxen (NAPROSYN) 500 MG tablet Take 1 tablet (500 mg total) by mouth 2 (two) times daily with a meal. For leg pain. 60 tablet 0   NEEDLE, DISP, 30 G 30G X 1/2" MISC Inject 1 each as directed daily. 50 each 0   ondansetron (ZOFRAN) 4 MG tablet TAKE 1 TABLET BY MOUTH EVERY 8 HOURS AS NEEDED FOR NAUSEA OR VOMITING 20 tablet 0   pantoprazole (PROTONIX) 20 MG tablet Take 1 tablet (20 mg total) by mouth daily. 60 tablet 0   phentermine (ADIPEX-P) 37.5 MG tablet Take 0.5 tablets (18.75 mg total) by mouth daily after breakfast. 30 tablet 0   POTASSIUM PO Take by mouth.     Semaglutide, 2 MG/DOSE, 8 MG/3ML SOPN Inject 2 mg as directed once a week. 3 mL 5   No current facility-administered medications for this visit.   Facility-Administered Medications Ordered in Other Visits  Medication Dose Route Frequency Provider Last Rate Last Admin   sodium chloride flush (NS) 0.9 % injection 10 mL  10 mL Intravenous PRN Johney Maine, MD   10 mL at 04/16/17 1938    REVIEW OF SYSTEMS:    10 Point review of Systems was done is negative except as noted above.   This is a PHYSICAL EXAMINATION:  .There were no vitals taken for this visit.  GENERAL:alert, in no acute distress and comfortable SKIN: no acute rashes, no significant lesions EYES: conjunctiva are pink and non-injected, sclera anicteric OROPHARYNX: MMM, no exudates, no oropharyngeal erythema or ulceration NECK: supple, no JVD LYMPH:  no palpable lymphadenopathy in the cervical, axillary or inguinal regions LUNGS: clear to auscultation b/l with normal respiratory effort HEART: regular rate & rhythm ABDOMEN:  normoactive bowel sounds , non tender, not distended. Extremity: no pedal edema PSYCH: alert & oriented x 3 with fluent speech NEURO: no focal motor/sensory deficits   LABORATORY DATA:  I have reviewed the data as listed     Latest Ref Rng & Units 03/18/2023   12:05 PM 07/27/2022   12:53 PM 01/23/2022    2:13 PM  CBC  WBC 4.0 - 10.5 K/uL 5.4  5.4  5.6   Hemoglobin 12.0 - 15.0 g/dL 09.8  11.9  14.7   Hematocrit 36.0 - 46.0 % 43.7  42.7  43.6   Platelets 150 - 400 K/uL 244  260  255       Latest Ref Rng & Units 04/25/2023    1:50 PM 03/18/2023   12:05 PM 07/27/2022   12:53 PM  CMP  Glucose 70 - 99 mg/dL 89  829  79   BUN 6 - 23 mg/dL 9  13  13    Creatinine 0.40 - 1.20 mg/dL 5.62  1.30  8.65   Sodium 135 - 145 mEq/L 139  139  139   Potassium 3.5 - 5.1 mEq/L 4.4  4.1  4.1   Chloride 96 - 112 mEq/L 101  103  102   CO2 19 - 32 mEq/L 29  27  31    Calcium 8.4 - 10.5 mg/dL 9.9  9.6  78.4   Total Protein 6.5 - 8.1 g/dL  7.3  6.8   Total Bilirubin 0.3 - 1.2 mg/dL  0.6  0.5   Alkaline Phos  38 - 126 U/L  57  42   AST 15 - 41 U/L  15  13   ALT 0 - 44 U/L  12  8    . Lab Results  Component Value Date   LDH 114 03/18/2023    Lymphnode biopsy, 02/11/2017 Diagnosis Lymph node for lymphoma, Mesenteric Lymphadenopathy - HODGKIN LYMPHOMA.         PROCEDURES  ECHO 03/13/17 Study Conclusions  -  Left ventricle: The cavity size was normal. Systolic function was  normal. The estimated ejection fraction was in the range of 55%  to 60%. Wall motion was normal; there were no regional wall  motion abnormalities. Left ventricular diastolic function  parameters were normal. - Atrial septum: No defect or patent foramen ovale was identified. - Impressions: Normal GLS -20.2.   RADIOGRAPHIC STUDIES: I have personally reviewed the radiological images as listed and agreed with the findings in the report. US THYROID Result Date: 07/04/2023 CLINICAL DATA:  Thyroid nodule seen on recent CT EXAM: THYROID ULTRASOUND TECHNIQUE: Ultrasound examination of the thyroid gland and adjacent soft tissues was performed. COMPARISON:  CT chest abdomen pelvis 06/27/2023 FINDINGS: Parenchymal Echotexture: Mildly heterogenous Isthmus: 0.3 cm Right lobe: 5.3 x 1.8 x 2.0 cm Left lobe: 5.1 x 1.4 x 1.6 cm _________________________________________________________ Estimated total number of nodules >/= 1 cm: 2 Number of spongiform nodules >/=  2 cm not described below (TR1): 0 Number of mixed cystic and solid nodules >/= 1.5 cm not described below (TR2): 0 _________________________________________________________ Nodule # 1: Location: Right; superior Maximum size: 2.3 cm; Other 2 dimensions: 1.4 x 1.4 cm Composition: mixed cystic and solid (1) Echogenicity: isoechoic (1) Shape: not taller-than-wide (0) Margins: ill-defined (0) Echogenic foci: none (0) ACR TI-RADS total points: 2. ACR TI-RADS risk category: TR2 (2 points). ACR TI-RADS recommendations: This nodule does NOT meet TI-RADS criteria for biopsy or dedicated follow-up. This nodule corresponds to the abnormality seen on recent CT. _________________________________________________________ Nodule # 2: Location: Right; inferior Maximum size: 1.0 cm; Other 2 dimensions: 0.8 x 0.9 cm Composition: solid/almost completely solid (2) Echogenicity: hypoechoic (2) Shape: not taller-than-wide  (0) Margins: smooth (0) Echogenic foci: none (0) ACR TI-RADS total points: 4. ACR TI-RADS risk category: TR4 (4-6 points). ACR TI-RADS recommendations: *Given size (>/= 1 - 1.4 cm) and appearance, a follow-up ultrasound in 1 year should be considered based on TI-RADS criteria. _________________________________________________________ 0.5 cm solid hypoechoic left mid thyroid nodule does not meet criteria for imaging surveillance or FNA. IMPRESSION: Nodule 2 (TI-RADS 4), measuring 1.0 cm, located in the inferior right thyroid lobe meets criteria for imaging follow-up. Annual ultrasound surveillance is recommended until 5 years of stability is documented. The above is in keeping with the ACR TI-RADS recommendations - J Am Coll Radiol 2017;14:587-595. Electronically Signed   By: Acquanetta Belling M.D.   On: 07/04/2023 19:41   CT CHEST ABDOMEN PELVIS W CONTRAST Result Date: 06/27/2023 CLINICAL DATA:  History of hematologic malignancy, increased fatigue. 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 * Tracking Code: BO * EXAM: CT CHEST, ABDOMEN, AND PELVIS WITH CONTRAST TECHNIQUE: Multidetector CT imaging of the chest, abdomen and pelvis was performed following the standard protocol during bolus administration of intravenous contrast. RADIATION DOSE REDUCTION: This exam was performed according to the departmental dose-optimization program which includes automated exposure control, adjustment of the mA and/or kV according to patient size and/or use of iterative reconstruction technique. CONTRAST:  OMNIPAQUE IOHEXOL 300 MG/ML  SOLN COMPARISON:  Multiple priors  including CT June 09, 2021 and May 24, 2020 FINDINGS: CT CHEST FINDINGS Cardiovascular: No significant vascular findings. Normal heart size. No pericardial effusion. Mediastinum/Nodes: Similar to slightly decreased size of prominent/mildly enlarged axillary and mediastinal lymph nodes. For reference: -right paratracheal lymph  node measures 6 mm in short axis on image 19/2, previously 8 mm. -lymph node adjacent to the distal descending thoracic aorta measures 8 mm in short axis on image 45/2 previously 11 mm. No new or enlarging lymph nodes identified in the chest. Heterogeneous 16 mm nodule in the right lobe of the thyroid is new from prior. Lungs/Pleura: Stable tiny 3 mm pulmonary nodule in the anterior right upper lobe on image 55/4 and 2 mm nodule in the right lung apex on image 16/4, compatible with a benign finding. No new suspicious pulmonary nodules or masses. No pleural effusion. No pneumothorax. Musculoskeletal: No aggressive lytic or blastic lesion of bone. Soft tissue mass in the upper central right breast measures 3.1 x 1.1 cm on image 40/2 previously 4.1 x 2.1 cm. CT ABDOMEN PELVIS FINDINGS Hepatobiliary: No suspicious hepatic lesion. Gallbladder is unremarkable. No biliary ductal dilation. Pancreas: No pancreatic ductal dilation or evidence of acute inflammation. Spleen: No splenomegaly. Adrenals/Urinary Tract: Bilateral adrenal glands appear normal. No hydronephrosis. Kidneys demonstrate symmetric enhancement. Urinary bladder is unremarkable for degree of distension. Stomach/Bowel: Stomach is within normal limits. No evidence of bowel wall thickening, distention, or inflammatory changes. Vascular/Lymphatic: Normal caliber abdominal aorta. The portal, splenic and superior mesenteric veins are patent. Enlarged retroperitoneal and mesenteric lymph nodes are similar prior. For reference: -Gastrohepatic ligament lymph nodes measure up to 9 mm, unchanged. -lymph node anterior to the SMA measures 18 mm in short axis on image 17/2 previously 17 mm. Reproductive: Intrauterine device in place. No suspicious adnexal mass. Other: No aggressive lytic or blastic lesion of bone. Musculoskeletal: No aggressive lytic or blastic lesion of bone. Multilevel degenerative changes spine. IMPRESSION: 1. Similar to slightly decreased size of lymph  nodes above the diaphragm with similar lymphadenopathy below the diaphragm. No new pathologically enlarged or enlarging lymph nodes identified above or below the diaphragm. 2. No splenomegaly. 3. Soft tissue mass in the upper central right breast measures 3.1 x 1.1 cm previously 4.1 x 2.1 cm. 4. Heterogeneous 16 mm nodule in the right lobe of the thyroid is new from prior, suggest further evaluation with thyroid ultrasound. Electronically Signed   By: Maudry Mayhew M.D.   On: 06/27/2023 14:50      ASSESSMENT & PLAN:   43 y.o. 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 Previously this was Deauville 5.  S/p R-CHOP x 6 cycles  PET/CT 08/06/2017: No evidence active lymphoma with minimal activity within the remaining periaortic and mesenteric lymph nodes - Deauville 2  2) Epigastric abdominal discomfort Significantly improved with PPI therapy.  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 CBC is stable. CMP is stable LDH WNL at 114 -Start iron supplement 3 times a week for 3 months to see if it improves leg cramps.  -Goal is to keep Ferritin level above 100.  -Recommend labs with iron lab with PCP in 3 months.  -Continue Magnesium and Potassium supplements.  - no lab, or clinical evidence of HL progression at this time.   FOLLOW-UP: ***  The total time spent in the appointment was *** minutes* .  All of the patient's questions were answered with apparent satisfaction. The patient knows to call the clinic with any problems, questions or concerns.   Wyvonnia Lora MD MS AAHIVMS [facility] Hematology/Oncology Physician [facility]  .*Total Encounter Time as defined by the Centers for Medicare and Medicaid Services includes, in addition to the face-to-face time of a patient visit (documented in the note above) non-face-to-face time: obtaining and reviewing outside history, ordering and reviewing medications, tests or procedures, care coordination (communications with other health care professionals or caregivers) and documentation in the medical record.   I,Param Shah,acting as a Neurosurgeon for Wyvonnia Lora, MD.,have documented all relevant documentation on the behalf of Wyvonnia Lora, MD,as directed by  Wyvonnia Lora, MD while in the presence of Wyvonnia Lora, MD.

## 2023-07-22 ENCOUNTER — Encounter: Payer: Self-pay | Admitting: Hematology

## 2023-07-30 ENCOUNTER — Encounter: Payer: Self-pay | Admitting: Hematology

## 2023-07-30 ENCOUNTER — Other Ambulatory Visit (HOSPITAL_BASED_OUTPATIENT_CLINIC_OR_DEPARTMENT_OTHER): Payer: Self-pay

## 2023-07-30 MED ORDER — ESTRADIOL 0.025 MG/24HR TD PTTW
1.0000 | MEDICATED_PATCH | TRANSDERMAL | 2 refills | Status: DC
  Filled 2023-07-30: qty 8, 28d supply, fill #0
  Filled 2023-08-28: qty 8, 28d supply, fill #1
  Filled 2023-09-20: qty 8, 28d supply, fill #2

## 2023-07-31 ENCOUNTER — Other Ambulatory Visit: Payer: Self-pay | Admitting: Family

## 2023-07-31 ENCOUNTER — Other Ambulatory Visit (HOSPITAL_BASED_OUTPATIENT_CLINIC_OR_DEPARTMENT_OTHER): Payer: Self-pay

## 2023-07-31 ENCOUNTER — Other Ambulatory Visit: Payer: Self-pay

## 2023-07-31 DIAGNOSIS — K2960 Other gastritis without bleeding: Secondary | ICD-10-CM

## 2023-07-31 DIAGNOSIS — E119 Type 2 diabetes mellitus without complications: Secondary | ICD-10-CM

## 2023-07-31 DIAGNOSIS — R252 Cramp and spasm: Secondary | ICD-10-CM

## 2023-07-31 MED ORDER — PANTOPRAZOLE SODIUM 20 MG PO TBEC
20.0000 mg | DELAYED_RELEASE_TABLET | Freq: Every day | ORAL | 0 refills | Status: DC
  Filled 2023-07-31 – 2023-08-10 (×3): qty 30, 30d supply, fill #0
  Filled 2023-09-11: qty 30, 30d supply, fill #1

## 2023-07-31 MED ORDER — METFORMIN HCL ER 500 MG PO TB24
500.0000 mg | ORAL_TABLET | Freq: Every evening | ORAL | 0 refills | Status: DC
  Filled 2023-07-31 – 2023-08-10 (×4): qty 30, 30d supply, fill #0
  Filled 2023-09-11: qty 30, 30d supply, fill #1
  Filled 2023-10-16: qty 30, 30d supply, fill #2

## 2023-07-31 MED ORDER — NAPROXEN 500 MG PO TABS
500.0000 mg | ORAL_TABLET | Freq: Two times a day (BID) | ORAL | 0 refills | Status: DC
  Filled 2023-07-31: qty 60, 30d supply, fill #0

## 2023-08-02 ENCOUNTER — Other Ambulatory Visit: Payer: Self-pay

## 2023-08-02 ENCOUNTER — Other Ambulatory Visit: Payer: Self-pay | Admitting: Family

## 2023-08-02 ENCOUNTER — Other Ambulatory Visit (HOSPITAL_BASED_OUTPATIENT_CLINIC_OR_DEPARTMENT_OTHER): Payer: Self-pay

## 2023-08-04 ENCOUNTER — Encounter: Payer: Self-pay | Admitting: Hematology

## 2023-08-04 ENCOUNTER — Other Ambulatory Visit: Payer: Self-pay

## 2023-08-04 ENCOUNTER — Other Ambulatory Visit (HOSPITAL_BASED_OUTPATIENT_CLINIC_OR_DEPARTMENT_OTHER): Payer: Self-pay

## 2023-08-04 MED ORDER — PHENTERMINE HCL 37.5 MG PO TABS
18.7500 mg | ORAL_TABLET | Freq: Every day | ORAL | 0 refills | Status: DC
  Filled 2023-08-04: qty 30, 60d supply, fill #0
  Filled 2023-08-07: qty 15, 30d supply, fill #0
  Filled 2023-09-11: qty 15, 30d supply, fill #1

## 2023-08-05 ENCOUNTER — Other Ambulatory Visit (HOSPITAL_BASED_OUTPATIENT_CLINIC_OR_DEPARTMENT_OTHER): Payer: Self-pay

## 2023-08-05 ENCOUNTER — Other Ambulatory Visit: Payer: Self-pay

## 2023-08-06 ENCOUNTER — Other Ambulatory Visit: Payer: Self-pay | Admitting: Family

## 2023-08-06 DIAGNOSIS — E559 Vitamin D deficiency, unspecified: Secondary | ICD-10-CM

## 2023-08-06 NOTE — Telephone Encounter (Signed)
Requesting: Vitamin D3 1.25 MG (50000 UT) Oral Capsule  Last Visit: 06/10/2023 Next Visit: Visit date not found Last Refill: 05/07/2023  Please Advise

## 2023-08-07 ENCOUNTER — Encounter: Payer: Self-pay | Admitting: Hematology

## 2023-08-07 ENCOUNTER — Other Ambulatory Visit (HOSPITAL_BASED_OUTPATIENT_CLINIC_OR_DEPARTMENT_OTHER): Payer: Self-pay

## 2023-08-08 ENCOUNTER — Other Ambulatory Visit: Payer: Self-pay

## 2023-08-08 ENCOUNTER — Encounter: Payer: Self-pay | Admitting: Hematology

## 2023-08-08 ENCOUNTER — Other Ambulatory Visit (HOSPITAL_BASED_OUTPATIENT_CLINIC_OR_DEPARTMENT_OTHER): Payer: Self-pay

## 2023-08-09 ENCOUNTER — Ambulatory Visit

## 2023-08-09 DIAGNOSIS — G4719 Other hypersomnia: Secondary | ICD-10-CM

## 2023-08-09 DIAGNOSIS — R0683 Snoring: Secondary | ICD-10-CM | POA: Diagnosis not present

## 2023-08-10 ENCOUNTER — Other Ambulatory Visit (HOSPITAL_BASED_OUTPATIENT_CLINIC_OR_DEPARTMENT_OTHER): Payer: Self-pay

## 2023-08-23 ENCOUNTER — Telehealth: Payer: Self-pay | Admitting: Primary Care

## 2023-08-23 ENCOUNTER — Telehealth: Payer: 59 | Admitting: Primary Care

## 2023-08-23 NOTE — Telephone Encounter (Addendum)
 Can we please go ahead and cancel today's appointment.  Patient completed sleep study on 1/19 but results are not yet available. Once I have them I can double book patient next week or call her directly   [facility]- Can we follow up on HST

## 2023-08-26 NOTE — Telephone Encounter (Signed)
 SNAP HST was read on 08/09/23 but it hasn't been scanned into Epic yet

## 2023-08-26 NOTE — Telephone Encounter (Signed)
 LM for PT to call for appt. Sleep test has been scanned in.

## 2023-08-26 NOTE — Telephone Encounter (Signed)
 Once this is scanned, have pt scheduled.

## 2023-08-28 ENCOUNTER — Other Ambulatory Visit (HOSPITAL_BASED_OUTPATIENT_CLINIC_OR_DEPARTMENT_OTHER): Payer: Self-pay

## 2023-08-28 NOTE — Telephone Encounter (Signed)
 This patient was scheduled by Selena Batten this morning with [facility] on 3/7.  Nothing further needed at this time.

## 2023-08-28 NOTE — Telephone Encounter (Signed)
 LM to call for appt. Sending MYCHART msg.

## 2023-08-30 ENCOUNTER — Telehealth: Admitting: Primary Care

## 2023-08-30 DIAGNOSIS — R0683 Snoring: Secondary | ICD-10-CM

## 2023-08-30 DIAGNOSIS — J329 Chronic sinusitis, unspecified: Secondary | ICD-10-CM

## 2023-08-30 NOTE — Progress Notes (Signed)
 Virtual Visit via Video Note  I connected with Bonnie Larsen on 08/30/23 at  2:30 PM EST by a video enabled telemedicine application and verified that I am speaking with the correct person using two identifiers.  Location: Patient: Home Provider: Office    I discussed the limitations of evaluation and management by telemedicine and the availability of in person appointments. The patient expressed understanding and agreed to proceed.  History of Present Illness: 43 year old female, never smoked. PMH significant for prediabetes, hodgkin lymphoma, gastric reflux, thyroid nodules, GAD, obesity.  07/09/2023 Discussed the use of AI scribe software for clinical note transcription with the patient, who gave verbal consent to proceed.  History of Present Illness   The patient, with a medical history of prediabetes, Hodgkin's lymphoma, acid reflux, generalized anxiety disorder, and obesity, presents for a sleep consult due to persistent fatigue. They report difficulty maintaining sleep, often waking up five to six times a night. They describe episodes of waking up choking or gasping for air, followed by coughing fits that can last up to five minutes. These episodes are often accompanied by a sensation of shortness of breath that precedes the coughing.  The patient also reports excessive daytime sleepiness, including recent episodes of drowsiness while driving. They typically go to bed between 7:30-8:30 PM and wake up at 4 AM, estimating a total of five to six hours of sleep per night. They deny any history of seizures, sleepwalking, restless leg symptoms, or other sleep disorders.  The patient's family reports that they snore, but the patient only notices this when they are particularly tired or have allergies. They also report discomfort when lying down due to the weight of their breasts, which they feel may be contributing to their breathing difficulties during sleep.  The patient has been on Protonix  for acid reflux since undergoing chemotherapy. They sleep on an elevated pillow wedge to help manage their reflux symptoms. They have also been diagnosed with two thyroid nodules and have recently lost 40 pounds with the help of Ozempic. Despite this weight loss, they have not noticed any significant improvement in their sleep quality.      Sleep questionnaire Symptoms-  possible snoring, waking up gasping/choking, daytime fatigue, non-restorative sleep Prior sleep study- none Bedtime- 8-8:30pm Time to fall asleep- 20-30 mins Nocturnal awakenings- 5-6 times  Out of bed/start of day- 4am Weight changes- down 40 lbs  Do you operate heavy machinery- no Do you currently wear CPAP- no Do you current wear oxygen- no Epworth- 13 Medications- Ozempic    08/30/2023- Interim hx  Discussed the use of AI scribe software for clinical note transcription with the patient, who gave verbal consent to proceed.  History of Present Illness   The patient is a 43 year old who presents with difficulty maintaining sleep and snoring.  She experiences difficulty maintaining sleep, waking up between five and six times a night, and has episodes of waking up choking or gasping for air followed by coughing fits. Daytime sleepiness and drowsiness while driving are also present. Her family has reported that she snores, particularly when tired or experiencing allergies.  HST on 07/13/22 showed normal levels of respiratory breathing. He had loud snoring without significant obstructive sleep apnea. She had a total of 10 apneas and 14 hyponeas during sleep study. Average AHI 3.2/hour. Her baseline oxygen level was 98%, with the lowest recorded at 91%, and she spent zero minutes with an oxygen level less than 88%.   She reports  chronic sinus issues, including sinus congestion, drainage, waking up congested, and facial swelling, which may contribute to her snoring and sleep disturbance.  She is currently using Ozempic for  weight loss.     Observations/Objective:  Appears well without overt respiratory symptoms   Assessment and Plan:  1. Snoring (Primary) - Ambulatory referral to ENT  2. Chronic sinusitis, unspecified location - Ambulatory referral to ENT     Snoring and Sleep Disruption Sleep study showed 3.2 apneic/hypopneic events per hour, not meeting obstructive sleep apnea criteria. Baseline oxygen 98%, minimum 91%. Loud snoring present. CPAP not required. Discussed oral appliance, positional therapy, and weight management. Mild sleep apnea poses minimal cardiovascular risk. - Advise side sleeping or wedge pillow use. - Recommend avoiding alcohol before bedtime. - Advise against driving if drowsy. - Encourage weight loss and maintaining normal BMI. - She will notify our office if she would like referral to orthodontic dentist for oral appliance evaluation if snoring is disruptive.  Chronic Sinus Issues Chronic sinus congestion, drainage, and facial swelling may contribute to snoring and sleep disruption. Suggested ENT evaluation for upper airway obstruction. - Refer to Dr. Irene Pap, ENT specialist, for evaluation. - Suggest trying Breathe Right strips  Weight Management Currently using Ozempic for weight loss. Continued weight management advised to improve sleep quality and reduce snoring. - Continue Ozempic for weight loss. - Encourage ongoing weight management efforts.      Follow Up Instructions:  As needed if sleep symptoms worsen    I discussed the assessment and treatment plan with the patient. The patient was provided an opportunity to ask questions and all were answered. The patient agreed with the plan and demonstrated an understanding of the instructions.   The patient was advised to call back or seek an in-person evaluation if the symptoms worsen or if the condition fails to improve as anticipated.  I provided 25 minutes of non-face-to-face time during this  encounter.   Bonnie Bayley, NP

## 2023-08-30 NOTE — Patient Instructions (Addendum)
-  SNORING AND SLEEP DISRUPTION: Your sleep study showed mild sleep apnea with 3.2 events per hour, which does not meet the criteria for obstructive sleep apnea. This means you do not need a CPAP machine. We discussed using an oral appliance, changing your sleeping position, and continuing with weight management to help reduce snoring. Avoid alcohol before bedtime and do not drive if you feel drowsy.  -CHRONIC SINUS ISSUES: Your chronic sinus congestion and facial swelling may be contributing to your snoring and sleep problems. We recommend seeing an ENT specialist for further evaluation. In the meantime, you can try using Breathe Right strips to help with congestion.  -WEIGHT MANAGEMENT: You are currently using Ozempic to help with weight loss. Continuing to manage your weight can improve your sleep quality and reduce snoring. Keep up with your weight management efforts.  INSTRUCTIONS: Schedule an appointment with Dr. Irene Pap, an ENT specialist, for further evaluation of your sinus issues. Continue using Ozempic for weight loss and maintain your weight management efforts. Follow up with [place] if sleep symptoms worsen.

## 2023-09-02 ENCOUNTER — Encounter (INDEPENDENT_AMBULATORY_CARE_PROVIDER_SITE_OTHER): Payer: Self-pay | Admitting: Otolaryngology

## 2023-09-04 ENCOUNTER — Ambulatory Visit: Payer: 59 | Admitting: Primary Care

## 2023-09-11 ENCOUNTER — Other Ambulatory Visit (HOSPITAL_BASED_OUTPATIENT_CLINIC_OR_DEPARTMENT_OTHER): Payer: Self-pay

## 2023-09-11 ENCOUNTER — Encounter: Payer: Self-pay | Admitting: Hematology

## 2023-09-11 ENCOUNTER — Other Ambulatory Visit: Payer: Self-pay | Admitting: Family

## 2023-09-11 ENCOUNTER — Other Ambulatory Visit: Payer: Self-pay

## 2023-09-11 DIAGNOSIS — R252 Cramp and spasm: Secondary | ICD-10-CM

## 2023-09-11 MED ORDER — NAPROXEN 500 MG PO TABS
500.0000 mg | ORAL_TABLET | Freq: Two times a day (BID) | ORAL | 0 refills | Status: DC
  Filled 2023-09-11: qty 60, 30d supply, fill #0

## 2023-09-12 ENCOUNTER — Other Ambulatory Visit: Payer: Self-pay

## 2023-09-12 DIAGNOSIS — E041 Nontoxic single thyroid nodule: Secondary | ICD-10-CM

## 2023-09-18 ENCOUNTER — Other Ambulatory Visit: Payer: 59

## 2023-09-23 ENCOUNTER — Ambulatory Visit (INDEPENDENT_AMBULATORY_CARE_PROVIDER_SITE_OTHER): Payer: 59 | Admitting: "Endocrinology

## 2023-09-23 ENCOUNTER — Encounter: Payer: Self-pay | Admitting: "Endocrinology

## 2023-09-23 VITALS — BP 118/80 | HR 102 | Ht 63.0 in | Wt 224.0 lb

## 2023-09-23 DIAGNOSIS — Z8571 Personal history of Hodgkin lymphoma: Secondary | ICD-10-CM

## 2023-09-23 DIAGNOSIS — E042 Nontoxic multinodular goiter: Secondary | ICD-10-CM | POA: Diagnosis not present

## 2023-09-23 LAB — T3, FREE: T3, Free: 3.5 pg/mL (ref 2.3–4.2)

## 2023-09-23 LAB — T4, FREE: Free T4: 1.4 ng/dL (ref 0.8–1.8)

## 2023-09-23 LAB — TSH: TSH: 1.06 m[IU]/L

## 2023-09-23 NOTE — Progress Notes (Signed)
 Outpatient Endocrinology Note Bonnie Merriam Woods, Larsen    Bonnie Larsen [birth year] [id number]  Referring Provider: Johney Maine, Larsen Primary Care Provider: Dulce Sellar, NP Reason for consultation: Subjective   Assessment & Plan  Diagnoses and all orders for this visit:  Multinodular goiter -     [place] FNA BX THYROID 1ST LESION AFIRMA; Future  History of Hodgkin's lymphoma -     [place] FNA BX THYROID 1ST LESION AFIRMA; Future   History of Hodgkin lymphoma lymphoma in the past Patient complains about waking up feeling show when waking up/talking, with voice getting also in the middle of talking Patient had sleep apnea ruled out with 06/2023 thyroid ultrasound reported Right; superior 2.3 cm x 1.4 x 1.4 cm-TR 2 which did not meet criteria for FNA.  Other nodules smaller and did not meet criteria for FNA, recommend 1 year follow-up with repeat thyroid ultrasound.  However given patient's history of Hodgkin's lymphoma as well as current complaints, recommend FNA on 2.3 cm x 1.4 x 1.4 cm nodule In the past, our radiologist did not proceed with FNA if they did not self recommended in the report, hence I placed an order for FNA explaining the need for FNA as well as recommended patient to have it ordered by another provider in another system in case our system does not do it Will speak to radiologist to comment on the CT done in 06/2023 to comment on the tracheal compression percentage to see if thyroidectomy will help patient with her symptoms, although the size of thyroid ultrasound as well as negative Pemberton sign would be an indication against thyroidectomy providing any benefit Contacted radiologist to comment of tracheal compression on 06/2023 CT chest    Return in about 15 days (around 10/08/2023) for visit, labs today.   I have reviewed current medications, nurse's notes, allergies, vital signs, past medical and surgical history, family medical history, and social history for  this encounter. Counseled patient on symptoms, examination findings, lab findings, imaging results, treatment decisions and monitoring and prognosis. The patient understood the recommendations and agrees with the treatment plan. All questions regarding treatment plan were fully answered.  Bonnie Bonnie Larsen  09/23/23   History of Present Illness HPI  Bonnie Larsen is a 43 y.o. year old female who presents for evaluation of thyroid.  C/o getting choked up while waking up/taking  Voice gets hoarse in the middle of talking  Recently had sleep apnea-was normal No positional dyspnea   C/o tiredness and sleepiness, had history of non-hodgkin's lymphoma  She was tested for sleep apnea.  Thyroid nodule seen on recent CT   06/2023 THYROID ULTRASOUND   TECHNIQUE: Ultrasound examination of the thyroid gland and adjacent soft tissues was performed.   COMPARISON:  CT chest abdomen pelvis 06/27/2023   FINDINGS: Parenchymal Echotexture: Mildly heterogenous   Isthmus: 0.3 cm   Right lobe: 5.3 x 1.8 x 2.0 cm   Left lobe: 5.1 x 1.4 x 1.6 cm   _________________________________________________________   Estimated total number of nodules >/= 1 cm: 2   Number of spongiform nodules >/=  2 cm not described below (TR1): 0   Number of mixed cystic and solid nodules >/= 1.5 cm not described below (TR2): 0   _________________________________________________________   Nodule # 1:   Location: Right; superior   Maximum size: 2.3 cm; Other 2 dimensions: 1.4 x 1.4 cm   Composition: mixed cystic and solid (1)   Echogenicity: isoechoic (1)  Shape: not taller-than-wide (0)   Margins: ill-defined (0)   Echogenic foci: none (0)   ACR TI-RADS total points: 2.   ACR TI-RADS risk category: TR2 (2 points).   ACR TI-RADS recommendations:   This nodule does NOT meet TI-RADS criteria for biopsy or dedicated follow-up.   This nodule corresponds to the abnormality seen on recent CT.    _________________________________________________________   Nodule # 2:   Location: Right; inferior   Maximum size: 1.0 cm; Other 2 dimensions: 0.8 x 0.9 cm   Composition: solid/almost completely solid (2)   Echogenicity: hypoechoic (2)   Shape: not taller-than-wide (0)   Margins: smooth (0)   Echogenic foci: none (0)   ACR TI-RADS total points: 4.   ACR TI-RADS risk category: TR4 (4-6 points).   ACR TI-RADS recommendations:   *Given size (>/= 1 - 1.4 cm) and appearance, a follow-up ultrasound in 1 year should be considered based on TI-RADS criteria.   _________________________________________________________   0.5 cm solid hypoechoic left mid thyroid nodule does not meet criteria for imaging surveillance or FNA.   IMPRESSION: Nodule 2 (TI-RADS 4), measuring 1.0 cm, located in the inferior right thyroid lobe meets criteria for imaging follow-up. Annual ultrasound surveillance is recommended until 5 years of stability is documented.  Physical Exam  BP 118/80   Pulse (!) 102   Ht 5\' 3"  (1.6 m)   Wt 224 lb (101.6 kg)   SpO2 99%   BMI 39.68 kg/m    Constitutional: well developed, well nourished Head: normocephalic, atraumatic Eyes: sclera anicteric, no redness Neck: supple, no thyromegaly noted  Lungs: normal respiratory effort Neurology: alert and oriented Skin: dry, no appreciable rashes Musculoskeletal: no appreciable defects Psychiatric: normal mood and affect   Current Medications Patient's Medications  New Prescriptions   No medications on file  Previous Medications   BACLOFEN (LIORESAL) 10 MG TABLET    Take 1-2 tablets (10-20 mg total) by mouth at bedtime as needed for muscle spasms.   CHOLECALCIFEROL (VITAMIN D3) 1.25 MG (50000 UT) CAPS    Take 1 capsule by mouth once a week   ESTRADIOL (VIVELLE-DOT) 0.025 MG/24HR    Apply 1 patch 2 (two) times a week.   FLUTICASONE (FLONASE) 50 MCG/ACT NASAL SPRAY    Place 2 sprays into both nostrils daily.    HYDROCHLOROTHIAZIDE (HYDRODIURIL) 12.5 MG TABLET    Take 1 tablet (12.5 mg total) by mouth daily as needed.   LEVONORGESTREL (MIRENA) 20 MCG/24HR IUD    1 each by Intrauterine route once.   MAGNESIUM PO    Take by mouth.   METFORMIN (GLUCOPHAGE-XR) 500 MG 24 HR TABLET    Take 1 tablet (500 mg total) by mouth every evening.   MULTIPLE VITAMIN (MULTIVITAMIN) TABLET    Take 1 tablet by mouth daily.   NAPROXEN (NAPROSYN) 500 MG TABLET    Take 1 tablet (500 mg total) by mouth 2 (two) times daily with a meal. For leg pain.   NEEDLE, DISP, 30 G 30G X 1/2" MISC    Inject 1 each as directed daily.   ONDANSETRON (ZOFRAN) 4 MG TABLET    TAKE 1 TABLET BY MOUTH EVERY 8 HOURS AS NEEDED FOR NAUSEA OR VOMITING   PANTOPRAZOLE (PROTONIX) 20 MG TABLET    Take 1 tablet (20 mg total) by mouth daily.   PHENTERMINE (ADIPEX-P) 37.5 MG TABLET    Take 0.5 tablets (18.75 mg total) by mouth daily after breakfast.   POTASSIUM PO    Take  by mouth.   SEMAGLUTIDE, 2 MG/DOSE, 8 MG/3ML SOPN    Inject 2 mg as directed once a week.  Modified Medications   No medications on file  Discontinued Medications   No medications on file    Allergies Allergies  Allergen Reactions   Clindamycin/Lincomycin Swelling    FACIAL SWELLING   Morphine    Cymbalta [Duloxetine Hcl] Nausea Only    Causes pt to have nausea and stomach cramping.    Past Medical History Past Medical History:  Diagnosis Date   Adenopathy 01/10/2017   Upper abdominal. Per CT   Anemia    Anxiety    Bacterial pharyngitis 07/30/2016   Counseling regarding advanced care planning and goals of care 06/24/2017   Depression    Difficult intravenous access    Family history of adverse reaction to anesthesia    pt's mother has hx. of post-op N/V and hard to wake up post-op   History of panic attacks    02/08/17- has not had a panic attacks   Lymphoma (HCC) 01/2017   Mesenteric lymphadenopathy 01/2017   Morbid obesity with BMI of 40.0-44.9, adult (HCC)    Need  for immunization against influenza 05/31/2021   Obesity, Class II, BMI 35-39.9, isolated 04/25/2015   Runny nose 02/19/2017   clear drainage, per pt.   Seasonal allergies     Past Surgical History Past Surgical History:  Procedure Laterality Date   LYMPH NODE BIOPSY N/A 02/11/2017   Procedure: HAND ASSISTED LAPAROSCOPIC LYMPH NODE BIOPSY;  Surgeon: Almond Lint, Larsen;  Location: [facility] OR;  Service: General;  Laterality: N/A;   PORT-A-CATH REMOVAL N/A 09/04/2017   Procedure: REMOVAL PORT-A-CATH;  Surgeon: Almond Lint, Larsen;  Location: [facility] OR;  Service: General;  Laterality: N/A;   PORTACATH PLACEMENT N/A 02/20/2017   Procedure: INSERTION PORT-A-CATH;  Surgeon: Almond Lint, Larsen;  Location: [facility];  Service: General;  Laterality: N/A;   TUBAL LIGATION     UMBILICAL HERNIA REPAIR  02/11/2017    Family History family history includes Alcohol abuse in her father; Anesthesia problems in her mother; Arthritis in her mother; Breast cancer (age of onset: 57) in her mother; COPD in her father; Cancer in her mother and paternal grandfather; Cirrhosis in her father; Congestive Heart Failure in her father; Diabetes in her father and maternal grandmother; Early death in her father; Fibromyalgia in her mother; Heart disease in her maternal grandmother; Neuropathy in her mother; Other in her brother; Rheum arthritis in her mother.  Social History Social History   Socioeconomic History   Marital status: Married    Spouse name: Chrissie Noa   Number of children: 2   Years of education: Not on file   Highest education level: Associate degree: occupational, Scientist, product/process development, or vocational program  Occupational History   Not on file  Tobacco Use   Smoking status: Never   Smokeless tobacco: Never  Vaping Use   Vaping status: Never Used  Substance and Sexual Activity   Alcohol use: No    Alcohol/week: 0.0 standard drinks of alcohol   Drug use: No   Sexual activity: Yes    Partners: Male    Birth  control/protection: I.U.D.    Comment: tubal ligation  Other Topics Concern   Not on file  Social History Narrative   Not on file   Social Drivers of Health   Financial Resource Strain: Low Risk  (09/03/2017)   Overall Financial Resource Strain (CARDIA)    Difficulty of Paying Living Expenses:  Not hard at all  Food Insecurity: No Food Insecurity (09/03/2017)   Hunger Vital Sign    Worried About Running Out of Food in the Last Year: Never true    Ran Out of Food in the Last Year: Never true  Transportation Needs: No Transportation Needs (09/03/2017)   PRAPARE - Administrator, Civil Service (Medical): No    Lack of Transportation (Non-Medical): No  Physical Activity: Inactive (09/03/2017)   Exercise Vital Sign    Days of Exercise per Week: 0 days    Minutes of Exercise per Session: 0 min  Stress: Stress Concern Present (09/03/2017)   [employer] of Occupational Health - Occupational Stress Questionnaire    Feeling of Stress : Rather much  Social Connections: Somewhat Isolated (09/03/2017)   Social Connection and Isolation Panel [NHANES]    Frequency of Communication with Friends and Family: More than three times a week    Frequency of Social Gatherings with Friends and Family: Never    Attends Religious Services: Never    Database administrator or Organizations: No    Attends Banker Meetings: Never    Marital Status: Married  Catering manager Violence: Not At Risk (09/03/2017)   Humiliation, Afraid, Rape, and Kick questionnaire    Fear of Current or Ex-Partner: No    Emotionally Abused: No    Physically Abused: No    Sexually Abused: No    Lab Results  Component Value Date   CHOL 188 08/15/2022   Lab Results  Component Value Date   HDL 49.80 08/15/2022   Lab Results  Component Value Date   LDLCALC 111 (H) 08/15/2022   Lab Results  Component Value Date   TRIG 139.0 08/15/2022   Lab Results  Component Value Date   CHOLHDL 4 08/15/2022    Lab Results  Component Value Date   CREATININE 1.05 04/25/2023   Lab Results  Component Value Date   GFR 64.57 04/25/2023      Component Value Date/Time   NA 139 04/25/2023 1350   NA 141 06/19/2017 0912   K 4.4 04/25/2023 1350   K 3.9 06/19/2017 0912   CL 101 04/25/2023 1350   CO2 29 04/25/2023 1350   CO2 25 06/19/2017 0912   GLUCOSE 89 04/25/2023 1350   GLUCOSE 90 06/19/2017 0912   BUN 9 04/25/2023 1350   BUN 10.7 06/19/2017 0912   CREATININE 1.05 04/25/2023 1350   CREATININE 1.15 (H) 03/18/2023 1205   CREATININE 0.9 06/19/2017 0912   CALCIUM 9.9 04/25/2023 1350   CALCIUM 9.5 06/19/2017 0912   PROT 7.3 03/18/2023 1205   PROT 6.9 06/19/2017 0912   ALBUMIN 4.4 03/18/2023 1205   ALBUMIN 3.8 06/19/2017 0912   AST 15 03/18/2023 1205   AST 17 06/19/2017 0912   ALT 12 03/18/2023 1205   ALT 15 06/19/2017 0912   ALKPHOS 57 03/18/2023 1205   ALKPHOS 35 (L) 06/19/2017 0912   BILITOT 0.6 03/18/2023 1205   BILITOT 0.32 06/19/2017 0912   GFRNONAA >60 03/18/2023 1205   GFRAA >60 12/24/2019 1230      Latest Ref Rng & Units 04/25/2023    1:50 PM 03/18/2023   12:05 PM 07/27/2022   12:53 PM  BMP  Glucose 70 - 99 mg/dL 89  409  79   BUN 6 - 23 mg/dL 9  13  13    Creatinine 0.40 - 1.20 mg/dL 8.11  9.14  7.82   Sodium 135 - 145 mEq/L 139  139  139   Potassium 3.5 - 5.1 mEq/L 4.4  4.1  4.1   Chloride 96 - 112 mEq/L 101  103  102   CO2 19 - 32 mEq/L 29  27  31    Calcium 8.4 - 10.5 mg/dL 9.9  9.6  65.7        Component Value Date/Time   WBC 5.4 03/18/2023 1205   WBC 5.6 01/23/2022 1413   RBC 5.48 (H) 03/18/2023 1205   HGB 14.1 03/18/2023 1205   HGB 12.3 06/19/2017 0912   HCT 43.7 03/18/2023 1205   HCT 37.6 06/19/2017 0912   PLT 244 03/18/2023 1205   PLT 257 06/19/2017 0912   MCV 79.7 (L) 03/18/2023 1205   MCV 83.6 06/19/2017 0912   MCH 25.7 (L) 03/18/2023 1205   MCHC 32.3 03/18/2023 1205   RDW 13.8 03/18/2023 1205   RDW 16.5 (H) 06/19/2017 0912   LYMPHSABS 1.4  03/18/2023 1205   LYMPHSABS 0.8 (L) 06/19/2017 0912   MONOABS 0.4 03/18/2023 1205   MONOABS 0.7 06/19/2017 0912   EOSABS 0.1 03/18/2023 1205   EOSABS 0.0 06/19/2017 0912   BASOSABS 0.0 03/18/2023 1205   BASOSABS 0.0 06/19/2017 0912   Lab Results  Component Value Date   TSH 1.06 09/23/2023   TSH 1.02 05/13/2023   TSH 1.14 08/15/2022   FREET4 1.4 09/23/2023         Parts of this note may have been dictated using voice recognition software. There may be variances in spelling and vocabulary which are unintentional. Not all errors are proofread. Please notify the Thereasa Parkin if any discrepancies are noted or if the meaning of any statement is not clear.

## 2023-10-08 ENCOUNTER — Ambulatory Visit: Admitting: "Endocrinology

## 2023-10-16 ENCOUNTER — Other Ambulatory Visit: Payer: Self-pay

## 2023-10-16 ENCOUNTER — Other Ambulatory Visit (HOSPITAL_BASED_OUTPATIENT_CLINIC_OR_DEPARTMENT_OTHER): Payer: Self-pay

## 2023-10-16 ENCOUNTER — Other Ambulatory Visit: Payer: Self-pay | Admitting: Family

## 2023-10-16 DIAGNOSIS — K2960 Other gastritis without bleeding: Secondary | ICD-10-CM

## 2023-10-16 MED ORDER — ESTRADIOL 0.025 MG/24HR TD PTTW
1.0000 | MEDICATED_PATCH | TRANSDERMAL | 0 refills | Status: DC
  Filled 2023-10-16: qty 8, 28d supply, fill #0

## 2023-10-16 MED ORDER — PANTOPRAZOLE SODIUM 20 MG PO TBEC
20.0000 mg | DELAYED_RELEASE_TABLET | Freq: Every day | ORAL | 0 refills | Status: DC
  Filled 2023-10-16: qty 30, 30d supply, fill #0
  Filled 2023-11-11: qty 30, 30d supply, fill #1

## 2023-10-21 ENCOUNTER — Other Ambulatory Visit (HOSPITAL_BASED_OUTPATIENT_CLINIC_OR_DEPARTMENT_OTHER): Payer: Self-pay

## 2023-10-21 MED ORDER — ESTRADIOL 0.025 MG/24HR TD PTTW
1.0000 | MEDICATED_PATCH | TRANSDERMAL | 2 refills | Status: DC
Start: 2023-10-21 — End: 2023-11-27
  Filled 2023-10-21 – 2023-11-08 (×2): qty 8, 28d supply, fill #0

## 2023-10-22 ENCOUNTER — Other Ambulatory Visit (HOSPITAL_BASED_OUTPATIENT_CLINIC_OR_DEPARTMENT_OTHER): Payer: Self-pay

## 2023-10-22 MED ORDER — ESTRADIOL 0.075 MG/24HR TD PTTW
1.0000 | MEDICATED_PATCH | TRANSDERMAL | 2 refills | Status: DC
Start: 2023-10-21 — End: 2024-04-08
  Filled 2023-10-22: qty 8, 28d supply, fill #0
  Filled 2023-11-27: qty 8, 28d supply, fill #1
  Filled 2023-12-23: qty 8, 28d supply, fill #2

## 2023-10-25 ENCOUNTER — Other Ambulatory Visit: Payer: Self-pay | Admitting: Family

## 2023-10-25 DIAGNOSIS — E559 Vitamin D deficiency, unspecified: Secondary | ICD-10-CM

## 2023-11-08 ENCOUNTER — Other Ambulatory Visit (HOSPITAL_BASED_OUTPATIENT_CLINIC_OR_DEPARTMENT_OTHER): Payer: Self-pay

## 2023-11-08 ENCOUNTER — Other Ambulatory Visit: Payer: Self-pay | Admitting: Family

## 2023-11-08 ENCOUNTER — Other Ambulatory Visit: Payer: Self-pay

## 2023-11-08 DIAGNOSIS — E119 Type 2 diabetes mellitus without complications: Secondary | ICD-10-CM

## 2023-11-08 MED ORDER — METFORMIN HCL ER 500 MG PO TB24
500.0000 mg | ORAL_TABLET | Freq: Every evening | ORAL | 0 refills | Status: DC
  Filled 2023-11-08: qty 90, 90d supply, fill #0
  Filled 2023-11-11: qty 30, 30d supply, fill #0
  Filled 2023-12-23: qty 30, 30d supply, fill #1
  Filled 2024-01-20: qty 30, 30d supply, fill #2

## 2023-11-11 ENCOUNTER — Other Ambulatory Visit (HOSPITAL_BASED_OUTPATIENT_CLINIC_OR_DEPARTMENT_OTHER): Payer: Self-pay

## 2023-11-11 ENCOUNTER — Other Ambulatory Visit: Payer: Self-pay | Admitting: Family

## 2023-11-11 ENCOUNTER — Other Ambulatory Visit: Payer: Self-pay

## 2023-11-11 DIAGNOSIS — R252 Cramp and spasm: Secondary | ICD-10-CM

## 2023-11-11 NOTE — Telephone Encounter (Signed)
needs OV

## 2023-11-12 ENCOUNTER — Other Ambulatory Visit (HOSPITAL_BASED_OUTPATIENT_CLINIC_OR_DEPARTMENT_OTHER): Payer: Self-pay

## 2023-11-13 ENCOUNTER — Other Ambulatory Visit: Payer: Self-pay | Admitting: Family

## 2023-11-13 ENCOUNTER — Encounter (INDEPENDENT_AMBULATORY_CARE_PROVIDER_SITE_OTHER): Payer: Self-pay

## 2023-11-13 ENCOUNTER — Other Ambulatory Visit (HOSPITAL_BASED_OUTPATIENT_CLINIC_OR_DEPARTMENT_OTHER): Payer: Self-pay

## 2023-11-13 ENCOUNTER — Telehealth (INDEPENDENT_AMBULATORY_CARE_PROVIDER_SITE_OTHER): Payer: Self-pay | Admitting: Otolaryngology

## 2023-11-13 DIAGNOSIS — R252 Cramp and spasm: Secondary | ICD-10-CM

## 2023-11-13 MED ORDER — NAPROXEN 500 MG PO TABS
500.0000 mg | ORAL_TABLET | Freq: Two times a day (BID) | ORAL | 0 refills | Status: DC
  Filled 2023-11-13: qty 60, 30d supply, fill #0

## 2023-11-13 NOTE — Telephone Encounter (Signed)
 Called and LVM with appt date, time and address; Also sent MyChart msg and Reminder text

## 2023-11-14 ENCOUNTER — Encounter (HOSPITAL_BASED_OUTPATIENT_CLINIC_OR_DEPARTMENT_OTHER): Payer: Self-pay

## 2023-11-14 ENCOUNTER — Other Ambulatory Visit (HOSPITAL_BASED_OUTPATIENT_CLINIC_OR_DEPARTMENT_OTHER): Payer: Self-pay

## 2023-11-15 ENCOUNTER — Other Ambulatory Visit (HOSPITAL_BASED_OUTPATIENT_CLINIC_OR_DEPARTMENT_OTHER): Payer: Self-pay

## 2023-11-15 ENCOUNTER — Encounter (INDEPENDENT_AMBULATORY_CARE_PROVIDER_SITE_OTHER): Payer: Self-pay | Admitting: Otolaryngology

## 2023-11-15 ENCOUNTER — Ambulatory Visit (INDEPENDENT_AMBULATORY_CARE_PROVIDER_SITE_OTHER): Admitting: Otolaryngology

## 2023-11-15 VITALS — BP 132/82 | HR 103

## 2023-11-15 DIAGNOSIS — J342 Deviated nasal septum: Secondary | ICD-10-CM | POA: Diagnosis not present

## 2023-11-15 DIAGNOSIS — K219 Gastro-esophageal reflux disease without esophagitis: Secondary | ICD-10-CM

## 2023-11-15 DIAGNOSIS — T17308A Unspecified foreign body in larynx causing other injury, initial encounter: Secondary | ICD-10-CM | POA: Diagnosis not present

## 2023-11-15 DIAGNOSIS — J343 Hypertrophy of nasal turbinates: Secondary | ICD-10-CM | POA: Diagnosis not present

## 2023-11-15 DIAGNOSIS — R0982 Postnasal drip: Secondary | ICD-10-CM

## 2023-11-15 DIAGNOSIS — R0683 Snoring: Secondary | ICD-10-CM | POA: Diagnosis not present

## 2023-11-15 DIAGNOSIS — R0981 Nasal congestion: Secondary | ICD-10-CM

## 2023-11-15 DIAGNOSIS — J3089 Other allergic rhinitis: Secondary | ICD-10-CM

## 2023-11-15 MED ORDER — FLUTICASONE PROPIONATE 50 MCG/ACT NA SUSP
2.0000 | Freq: Every day | NASAL | 6 refills | Status: AC
  Filled 2023-11-15: qty 16, 30d supply, fill #0
  Filled 2023-12-23: qty 16, 30d supply, fill #1
  Filled 2024-01-20: qty 16, 30d supply, fill #2

## 2023-11-15 MED ORDER — LEVOCETIRIZINE DIHYDROCHLORIDE 5 MG PO TABS
5.0000 mg | ORAL_TABLET | Freq: Every evening | ORAL | 3 refills | Status: AC
  Filled 2023-11-15: qty 30, 30d supply, fill #0
  Filled 2023-12-23: qty 30, 30d supply, fill #1
  Filled 2024-01-20: qty 30, 30d supply, fill #2

## 2023-11-15 NOTE — Progress Notes (Signed)
 ENT CONSULT:  Reason for Consult: snoring, chronic sinusitis    HPI: Discussed the use of AI scribe software for clinical note transcription with the patient, who gave verbal consent to proceed.  History of Present Illness Bonnie Larsen is a 43 year old female who presents with snoring, chronic nasal congestion and choking episodes. She had a home sleep study for suspected sleep apnea, but the study showed no evidence of OSA.  She experiences significant snoring and choking episodes, particularly at night, causing her to wake up gasping for air. She describes a sensation of choking due to nasal drainage, which has also led to ear infections and persistent dryness in her ears. These symptoms have been ongoing and have significantly impacted her sleep quality and daily energy levels.  She has a history of stage three Hodgkin's lymphoma, treated with chemotherapy in 2016, without the need for radiation therapy. During routine follow-up for her lymphoma, thyroid  nodules were discovered towards the end of last year. The plan is to repeat the ultrasound next year after bx was c/w benign nodules.  She experiences nasal congestion and drainage, which she attributes to environmental allergies. She uses over-the-counter Flonase and Claritin to manage her symptoms but reports itchy eyes, necessitating the use of allergy eye drops. She has not undergone allergy testing before.  She reports episodes of choking while eating, such as when chewing a fry and beginning to choke and gasp. She is currently taking pantoprazole  for reflux, which she suspects may be contributing to her symptoms.   Records Reviewed:  [facility] office visit 06/13/23  Patient with h/o Nodular lymphocyte predominant Hodgkins lymphoma with significantly worsening fatigue -will order CT CAP with patients consent- to r/o recurrent/progressive lymphoma -f/u with Dr Salomon Cree after C     PCP office visit Dr Alease Hunter 07/03/23 The nodule is benign  appearing but radiology does want it to be followed up annually for 5 years. I will make sure your treatment team is aware of this      Past Medical History:  Diagnosis Date   Adenopathy 01/10/2017   Upper abdominal. Per CT   Anemia    Anxiety    Bacterial pharyngitis 07/30/2016   Counseling regarding advanced care planning and goals of care 06/24/2017   Depression    Difficult intravenous access    Family history of adverse reaction to anesthesia    pt's mother has hx. 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   Seasonal allergies     Past Surgical History:  Procedure Laterality Date   LYMPH NODE BIOPSY N/A 02/11/2017   Procedure: HAND ASSISTED LAPAROSCOPIC LYMPH NODE BIOPSY;  Surgeon: Lockie Rima, MD;  Location: [facility] OR;  Service: General;  Laterality: N/A;   PORT-A-CATH REMOVAL N/A 09/04/2017   Procedure: REMOVAL PORT-A-CATH;  Surgeon: Lockie Rima, MD;  Location: [facility] OR;  Service: General;  Laterality: N/A;   PORTACATH PLACEMENT N/A 02/20/2017   Procedure: INSERTION PORT-A-CATH;  Surgeon: Lockie Rima, MD;  Location: [facility];  Service: General;  Laterality: N/A;   TUBAL LIGATION     UMBILICAL HERNIA REPAIR  02/11/2017    Family History  Problem Relation Age of Onset   Cancer Mother    Arthritis Mother    Breast cancer Mother 50   Fibromyalgia Mother    Rheum arthritis Mother    Neuropathy Mother    Anesthesia problems Mother        post-op N/V; hard to wake up post-op   Early death Father    Diabetes Father    COPD Father    Alcohol abuse Father    Congestive Heart Failure Father        died at age 31    Cirrhosis Father    Other Brother        Benign spinal tumor   Heart disease Maternal Grandmother    Diabetes Maternal Grandmother    Cancer Paternal Grandfather        Leukemia    Social History:  reports that she has never smoked. She has never used smokeless tobacco. She reports that she does not drink alcohol and does not use drugs.  Allergies:  Allergies  Allergen Reactions   Clindamycin /Lincomycin Swelling    FACIAL SWELLING   Morphine     Cymbalta  [Duloxetine  Hcl] Nausea Only    Causes pt to have nausea and stomach cramping.    Medications: I have reviewed the patient's current medications.  The PMH, PSH, Medications, Allergies, and SH were reviewed and updated.  ROS: Constitutional: Negative for fever, weight loss and weight gain. Cardiovascular: Negative for chest pain and dyspnea on exertion. Respiratory: Is not experiencing shortness of breath at rest. Gastrointestinal: Negative for nausea and vomiting. Neurological: Negative for headaches. Psychiatric: The patient is not nervous/anxious  Blood pressure 132/82, pulse (!) 103, SpO2 98%. There is no height or weight on file to calculate BMI.  PHYSICAL EXAM:  Exam: General: Well-developed, well-nourished Respiratory Respiratory effort: Equal inspiration and expiration without stridor Cardiovascular Peripheral Vascular: Warm extremities with equal color/perfusion Eyes: No nystagmus with equal extraocular motion bilaterally Neuro/Psych/Balance: Patient oriented to person, place, and time; Appropriate mood and affect; Gait is intact with no imbalance; Cranial nerves I-XII are intact Head and Face Inspection: Normocephalic and atraumatic without mass or lesion Palpation: Facial skeleton intact without bony stepoffs Salivary Glands: No mass or tenderness Facial Strength: Facial motility symmetric and full bilaterally ENT Pinna: External ear intact and fully developed External canal: Canal is patent with intact  skin Tympanic Membrane: Clear and mobile External Nose: No scar or anatomic deformity Internal Nose: Septum is S-shaped. No polyp, or purulence. Mucosal edema and erythema present.  Bilateral inferior turbinate hypertrophy.  Lips, Teeth, and gums: Mucosa and teeth intact and viable TMJ: No pain to palpation with full mobility Oral cavity/oropharynx: No erythema or exudate, no lesions present Nasopharynx: No mass or lesion with intact mucosa Hypopharynx: Intact mucosa without pooling of secretions Larynx Glottic: Full true vocal cord mobility without lesion or mass Supraglottic: Normal appearing epiglottis and AE folds Interarytenoid Space: Moderate pachydermia&edema Subglottic Space: Patent without lesion or edema Neck Neck and Trachea: Midline trachea without mass or lesion Thyroid : No mass or nodularity Lymphatics: No lymphadenopathy  Procedure: Preoperative diagnosis: choking episodes   Postoperative diagnosis:   Same + GERD LPR + post-nasal drainage   Procedure: Flexible fiberoptic laryngoscopy  Surgeon: Artice Last, MD  Anesthesia: Topical lidocaine  and Afrin Complications: None Condition is stable throughout exam  Indications and consent:  The patient presents to the clinic with above symptoms. Indirect laryngoscopy view was incomplete. Thus it was recommended that they undergo  a flexible fiberoptic laryngoscopy. All of the risks, benefits, and potential complications were reviewed with the patient preoperatively and verbal informed consent was obtained.  Procedure: The patient was seated upright in the clinic. Topical lidocaine  and Afrin were applied to the nasal cavity. After adequate anesthesia had occurred, I then proceeded to pass the flexible telescope into the nasal cavity. The nasal cavity was patent without rhinorrhea or polyp. The nasopharynx was also patent without mass or lesion. The base of tongue was visualized and was normal. There were no signs of pooling  of secretions in the piriform sinuses. The true vocal folds were mobile bilaterally. There were no signs of glottic or supraglottic mucosal lesion or mass. There was moderate interarytenoid pachydermia and post cricoid edema. The telescope was then slowly withdrawn and the patient tolerated the procedure throughout.     PROCEDURE NOTE: nasal endoscopy  Preoperative diagnosis: chronic nasal congestion symptoms + snoring   Postoperative diagnosis: same  Procedure: Diagnostic nasal endoscopy (16109)  Surgeon: Artice Last, M.D.  Anesthesia: Topical lidocaine  and Afrin  H&P REVIEW: The patient's history and physical were reviewed today prior to procedure. All medications were reviewed and updated as well. Complications: None Condition is stable throughout exam Indications and consent: The patient presents with symptoms of chronic sinusitis not responding to previous therapies. All the risks, benefits, and potential complications were reviewed with the patient preoperatively and informed consent was obtained. The time out was completed with confirmation of the correct procedure.   Procedure: The patient was seated upright in the clinic. Topical lidocaine  and Afrin were applied to the nasal cavity. After adequate anesthesia had occurred, the rigid nasal endoscope was passed into the nasal cavity. The nasal mucosa, turbinates, septum, and sinus drainage pathways were visualized bilaterally. This revealed no purulence or significant secretions that might be cultured. There were no polyps or sites of significant inflammation. The mucosa was intact and there was no crusting present. The scope was then slowly withdrawn and the patient tolerated the procedure well. There were no complications or blood loss.   Studies Reviewed: Thyroid  U/S 07/03/23 IMPRESSION: Nodule 2 (TI-RADS 4), measuring 1.0 cm, located in the inferior right thyroid  lobe meets criteria for imaging follow-up. Annual ultrasound  surveillance is recommended until 5 years of stability is documented  0.5 cm solid hypoechoic left mid thyroid  nodule does not meet criteria for imaging surveillance or FNA.  CT chest A/P 06/27/23 CLINICAL DATA:  History of hematologic malignancy, increased fatigue. 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 * Tracking Code: BO *  CT CHEST FINDINGS   Cardiovascular: No significant vascular findings. Normal heart size. No pericardial effusion.   Mediastinum/Nodes: Similar to slightly decreased size of prominent/mildly enlarged axillary and mediastinal lymph nodes. For reference:   -right paratracheal lymph node measures 6 mm in short axis on image 19/2, previously 8 mm.   -lymph node adjacent to the distal descending thoracic aorta measures 8 mm in short axis on image 45/2 previously 11 mm.   No new or enlarging lymph nodes identified in the chest.   Heterogeneous 16 mm nodule in the right lobe of the thyroid  is new from prior.   Lungs/Pleura: Stable tiny 3 mm pulmonary nodule in the anterior right upper lobe on image 55/4 and 2 mm nodule in the right lung apex on image 16/4, compatible with a benign finding. No new suspicious pulmonary nodules or masses. No pleural effusion. No pneumothorax.   Musculoskeletal: No aggressive lytic or blastic lesion  of bone.   Soft tissue mass in the upper central right breast measures 3.1 x 1.1 cm on image 40/2 previously 4.1 x 2.1 cm.   CT ABDOMEN PELVIS FINDINGS   Hepatobiliary: No suspicious hepatic lesion. Gallbladder is unremarkable. No biliary ductal dilation.   Pancreas: No pancreatic ductal dilation or evidence of acute inflammation.   Spleen: No splenomegaly.   Adrenals/Urinary Tract: Bilateral adrenal glands appear normal. No hydronephrosis. Kidneys demonstrate symmetric enhancement. Urinary bladder is unremarkable for degree of distension.   Stomach/Bowel: Stomach is within normal  limits. No evidence of bowel wall thickening, distention, or inflammatory changes.   Vascular/Lymphatic: Normal caliber abdominal aorta. The portal, splenic and superior mesenteric veins are patent.   Enlarged retroperitoneal and mesenteric lymph nodes are similar prior. For reference:   -Gastrohepatic ligament lymph nodes measure up to 9 mm, unchanged.   -lymph node anterior to the SMA measures 18 mm in short axis on image 17/2 previously 17 mm.   Reproductive: Intrauterine device in place. No suspicious adnexal mass.   Other: No aggressive lytic or blastic lesion of bone.   Musculoskeletal: No aggressive lytic or blastic lesion of bone. Multilevel degenerative changes spine.   IMPRESSION: 1. Similar to slightly decreased size of lymph nodes above the diaphragm with similar lymphadenopathy below the diaphragm. No new pathologically enlarged or enlarging lymph nodes identified above or below the diaphragm. 2. No splenomegaly. 3. Soft tissue mass in the upper central right breast measures 3.1 x 1.1 cm previously 4.1 x 2.1 cm. 4. Heterogeneous 16 mm nodule in the right lobe of the thyroid  is new from prior, suggest further evaluation with thyroid  ultrasound.  HST 08/26/23   Assessment/Plan: Encounter Diagnoses  Name Primary?   Environmental and seasonal allergies    Chronic nasal congestion    Hypertrophy of both inferior nasal turbinates    Nasal septal deviation    Post-nasal drip    Chronic GERD Yes    Assessment and Plan Assessment & Plan Chronic Nasal congestion Snoring Could be due to allergies and septal deviation/ITH noted on scope exam today Chronic nasal congestion from allergic rhinitis and septal deviation. Limited relief from current medications. - Prescribed Xyzal 5 mg daily for allergic symptoms. - Instruct on proper use of Flonase, targeting the lateral nasal wall  - Recommend nasal saline rinses using a NeilMed bottle. - Refer for allergy testing to  identify specific allergens and assess candidacy for immunotherapy.  Choking episodes Choking episodes likely due to silent reflux mostly at night but at times triggered by eating. Managed with pantoprazole . Denies sx of trouble with swallowing. Flexible scope exam today with findings c/w GERD LPR no other pathology and normal movement of b/l VF  - Provided dietary and lifestyle modification instructions for reflux management in the after visit summary. - Recommend a seaweed-based supplement reflux gourmet post-meals - will consider swallow study if persists   Sleep apnea workup negative  Home sleep test did not confirm sleep apnea. Discussed potential in-lab study to confirm results of the home sleep study results . - Consider lab-based sleep study if sleep apnea symptoms persist.  Thyroid  nodules Thyroid  nodules identified during routine lymphoma follow-up. Nodules do not warrant FNA at this time - Follow-up ultrasound recommended next year.   Thank you for allowing me to participate in the care of this patient. Please do not hesitate to contact me with any questions or concerns.   Artice Last, MD Otolaryngology [facility] Phone: [phone] Fax: [phone]  11/15/2023, 10:33 AM

## 2023-11-15 NOTE — Patient Instructions (Addendum)
 Sweet Oil  [email or web address] - check out this website to learn more about reflux   -Avoid lying down for at least two hours after a meal or after drinking acidic beverages, like soda, or other caffeinated beverages. This can help to prevent stomach contents from flowing back into the esophagus. -Keep your head elevated while you sleep. Using an extra pillow or two can also help to prevent reflux. -Eat smaller and more frequent meals each day instead of a few large meals. This promotes digestion and can aid in preventing heartburn. -Wear loose-fitting clothes to ease pressure on the stomach, which can worsen heartburn and reflux. -Reduce excess weight around the midsection. This can ease pressure on the stomach. Such pressure can force some stomach contents back up the esophagus   - Take Reflux Gourmet (natural supplement available on Amazon) to help with symptoms of chronic throat irritation      Andres Bangs Med Nasal Saline Rinse   - start nasal saline rinses with NeilMed Bottle available over the counter or online to help with nasal congestion

## 2023-11-27 ENCOUNTER — Ambulatory Visit (INDEPENDENT_AMBULATORY_CARE_PROVIDER_SITE_OTHER): Admitting: Family

## 2023-11-27 ENCOUNTER — Other Ambulatory Visit (HOSPITAL_BASED_OUTPATIENT_CLINIC_OR_DEPARTMENT_OTHER): Payer: Self-pay

## 2023-11-27 ENCOUNTER — Encounter: Payer: Self-pay | Admitting: Hematology

## 2023-11-27 DIAGNOSIS — E10A2 Type 1 diabetes mellitus, presymptomatic, stage 2: Secondary | ICD-10-CM | POA: Diagnosis not present

## 2023-11-27 DIAGNOSIS — F411 Generalized anxiety disorder: Secondary | ICD-10-CM

## 2023-11-27 DIAGNOSIS — F419 Anxiety disorder, unspecified: Secondary | ICD-10-CM | POA: Diagnosis not present

## 2023-11-27 DIAGNOSIS — F32A Depression, unspecified: Secondary | ICD-10-CM | POA: Diagnosis not present

## 2023-11-27 MED ORDER — PHENTERMINE HCL 37.5 MG PO TABS
18.7500 mg | ORAL_TABLET | Freq: Every day | ORAL | 0 refills | Status: DC
Start: 2023-11-27 — End: 2024-04-08
  Filled 2023-11-27: qty 15, 30d supply, fill #0
  Filled 2024-01-15: qty 15, 30d supply, fill #1

## 2023-11-27 MED ORDER — VORTIOXETINE HBR 5 MG PO TABS
5.0000 mg | ORAL_TABLET | Freq: Every day | ORAL | 1 refills | Status: DC
  Filled 2023-11-27: qty 30, 30d supply, fill #0
  Filled 2023-12-25: qty 30, 30d supply, fill #1

## 2023-11-27 MED ORDER — SEMAGLUTIDE (2 MG/DOSE) 8 MG/3ML ~~LOC~~ SOPN
2.0000 mg | PEN_INJECTOR | SUBCUTANEOUS | 3 refills | Status: DC
  Filled 2023-11-27: qty 3, 28d supply, fill #0
  Filled 2023-12-23 – 2023-12-25 (×2): qty 3, 28d supply, fill #1

## 2023-11-27 MED ORDER — PROPRANOLOL HCL 10 MG PO TABS
10.0000 mg | ORAL_TABLET | Freq: Two times a day (BID) | ORAL | 0 refills | Status: DC | PRN
Start: 2023-11-27 — End: 2024-02-07
  Filled 2023-11-27: qty 30, 15d supply, fill #0

## 2023-11-27 NOTE — Progress Notes (Unsigned)
 Patient ID: Bonnie Larsen, female    DOB: Mar 25, 1981, 43 y.o.   MRN: [id number]  Chief Complaint  Patient presents with  . Weight Check    Patient is on Ozempic , she is here to get refills. Patient states she has had low energy a while ago stated she talked to you a while ago and was advised to wait until sleeps study was back. Anxiety always been there was having issues with welbutrin.   . Anxiety  Discussed the use of AI scribe software for clinical note transcription with the patient, who gave verbal consent to proceed.  History of Present Illness Bonnie Larsen is a 43 year old female who presents for follow-up on medication management.  She has been using Ozempic  for six months, which suppresses her appetite for the first two to three days post-administration, but her appetite returns by the end of the week. Previously, she used Victoza , which consistently suppressed her appetite but led to a plateau in weight loss and headaches. She administers Ozempic  on Sundays, with reduced appetite until about Thursday. Phentermine  was used for energy levels but was not refilled pending an office visit.  She is on hormone replacement therapy with a patch due to low hormone levels. The dosage was increased to 0.75 mg about a month ago, resulting in improved sleep and reduced night sweats. She still has her uterus and uses a Mirena  IUD, which has prevented bleeding.  She experiences seasonal anxiety, particularly as the anniversary of her cancer diagnosis approaches in July, with symptoms of jitteriness and a racing heart. Bupropion  was previously tried but may have caused leg cramps. Baclofen  is currently used to manage these cramps effectively. Trintellix  was tried for anxiety, but insurance issues prevented continued use. Hydroxyzine  is used for anxiety as needed, causing drowsiness, and propranolol has been discussed for managing anxiety-related heart rate increases.  Assessment &  Plan Prediabetes/Weight Management - Ozempic  effective initially but appetite suppression diminishes after 3-4 days. Phentermine  previously beneficial for energy & appetite suppression. - Refill Ozempic  2mg  qweek. - Prescribe phentermine  18.75mg  qd starting on 4th day after Ozempic  injection to curb hunger. - Encourage small, frequent meals, and 2L water intake qd. - F/U in 1 month  Hormone Replacement Therapy - Hormone patch improved sleep and reduced night sweats. Increased to 0.75 mg patch last month. Also has Mirena  IUD for progesterone. - Continue hormone patch at 0.75 mg twice a week. - Continue to monitor symptoms.  Anxiety & depression Increased anxiety near cancer diagnosis anniversary. Bupropion  caused leg cramps. Trintellix  retry considered, tolerated, but too expensive as deductible not met, thinks it is now met. Propranolol for situational anxiety. - Prescribe Trintellix  5mg  qam after eating. Look for online coupon to use at pharmacy. - Prescribe propranolol 10mg  tid prn for situational anxiety. - Schedule follow-up appointment in one month.  Snoring and Possible Sleep Apnea Home study showed snoring without apnea. ENT noted deviated septum, recommended lab-based study and allergist referral. - Consider lab-based sleep study in a few months. - Follow up with allergist as per ENT referral.    Subjective:     Outpatient Medications Prior to Visit  Medication Sig Dispense Refill  . baclofen  (LIORESAL ) 10 MG tablet Take 1-2 tablets (10-20 mg total) by mouth at bedtime as needed for muscle spasms. 60 each 12  . Cholecalciferol (VITAMIN D3) 1.25 MG (50000 UT) CAPS Take 1 capsule by mouth once a week 12 capsule 0  . estradiol  (VIVELLE -DOT) 0.025 MG/24HR Place  1 patch onto the skin 2 (two) times a week. 8 patch 2  . estradiol  (VIVELLE -DOT) 0.075 MG/24HR Place 1 patch onto the skin 2 (two) times a week. 8 patch 2  . fluticasone  (FLONASE ) 50 MCG/ACT nasal spray Place 2 sprays  into both nostrils daily. 16 g 6  . hydrochlorothiazide  (HYDRODIURIL ) 12.5 MG tablet Take 1 tablet (12.5 mg total) by mouth daily as needed. 90 tablet 1  . levocetirizine (XYZAL  ALLERGY 24HR) 5 MG tablet Take 1 tablet (5 mg total) by mouth every evening. 30 tablet 3  . levonorgestrel  (MIRENA ) 20 MCG/24HR IUD 1 each by Intrauterine route once.    Bonnie Larsen MAGNESIUM PO Take by mouth.    . metFORMIN  (GLUCOPHAGE -XR) 500 MG 24 hr tablet Take 1 tablet (500 mg total) by mouth every evening. 90 tablet 0  . Multiple Vitamin (MULTIVITAMIN) tablet Take 1 tablet by mouth daily.    . naproxen  (NAPROSYN ) 500 MG tablet Take 1 tablet (500 mg total) by mouth 2 (two) times daily with a meal. For leg pain. 60 tablet 0  . NEEDLE, DISP, 30 G 30G X 1/2" MISC Inject 1 each as directed daily. 50 each 0  . ondansetron  (ZOFRAN ) 4 MG tablet TAKE 1 TABLET BY MOUTH EVERY 8 HOURS AS NEEDED FOR NAUSEA OR VOMITING 20 tablet 0  . pantoprazole  (PROTONIX ) 20 MG tablet Take 1 tablet (20 mg total) by mouth daily. 60 tablet 0  . phentermine  (ADIPEX-P ) 37.5 MG tablet Take 0.5 tablets (18.75 mg total) by mouth daily after breakfast. 30 tablet 0  . POTASSIUM PO Take by mouth.    . Semaglutide , 2 MG/DOSE, 8 MG/3ML SOPN Inject 2 mg as directed once a week. 3 mL 5   Facility-Administered Medications Prior to Visit  Medication Dose Route Frequency Provider Last Rate Last Admin  . sodium chloride  flush (NS) 0.9 % injection 10 mL  10 mL Intravenous PRN Kale, Gautam Kishore, MD   10 mL at 04/16/17 1938   Past Medical History:  Diagnosis Date  . Adenopathy 01/10/2017   Upper abdominal. Per CT  . Anemia   . Anxiety   . Bacterial pharyngitis 07/30/2016  . Counseling regarding advanced care planning and goals of care 06/24/2017  . Depression   . Difficult intravenous access   . Family history of adverse reaction to anesthesia    pt's mother has hx. of post-op N/V and hard to wake up post-op  . History of panic attacks    02/08/17- has not had  a panic attacks  . Lymphoma (HCC) 01/2017  . Mesenteric lymphadenopathy 01/2017  . Morbid obesity with BMI of 40.0-44.9, adult (HCC)   . Need for immunization against influenza 05/31/2021  . Obesity, Class II, BMI 35-39.9, isolated 04/25/2015  . Runny nose 02/19/2017   clear drainage, per pt.  . Seasonal allergies    Past Surgical History:  Procedure Laterality Date  . LYMPH NODE BIOPSY N/A 02/11/2017   Procedure: HAND ASSISTED LAPAROSCOPIC LYMPH NODE BIOPSY;  Surgeon: Lockie Rima, MD;  Location: [facility] OR;  Service: General;  Laterality: N/A;  . PORT-A-CATH REMOVAL N/A 09/04/2017   Procedure: REMOVAL PORT-A-CATH;  Surgeon: Lockie Rima, MD;  Location: [facility] OR;  Service: General;  Laterality: N/A;  . PORTACATH PLACEMENT N/A 02/20/2017   Procedure: INSERTION PORT-A-CATH;  Surgeon: Lockie Rima, MD;  Location: [facility];  Service: General;  Laterality: N/A;  . TUBAL LIGATION    . UMBILICAL HERNIA REPAIR  02/11/2017   Allergies  Allergen Reactions  .  Clindamycin /Lincomycin Swelling    FACIAL SWELLING  . Morphine    . Cymbalta  [Duloxetine  Hcl] Nausea Only    Causes pt to have nausea and stomach cramping.      Objective:    Physical Exam Vitals and nursing note reviewed.  Constitutional:      Appearance: Normal appearance. She is obese.  Cardiovascular:     Rate and Rhythm: Normal rate and regular rhythm.  Pulmonary:     Effort: Pulmonary effort is normal.     Breath sounds: Normal breath sounds.  Musculoskeletal:        General: Normal range of motion.  Skin:    General: Skin is warm and dry.  Neurological:     Mental Status: She is alert.  Psychiatric:        Mood and Affect: Mood normal.        Behavior: Behavior normal.   BP 118/86   Pulse (!) 104   Temp 97.9 F (36.6 C) (Temporal)   Ht 5\' 3"  (1.6 m)   Wt 226 lb 3.2 oz (102.6 kg)   SpO2 98%   BMI 40.07 kg/m  Wt Readings from Last 3 Encounters:  11/27/23 226 lb 3.2 oz (102.6 kg)  09/23/23 224 lb  (101.6 kg)  07/09/23 229 lb 12.8 oz (104.2 kg)      Versa Gore, NP

## 2023-11-28 ENCOUNTER — Ambulatory Visit: Admitting: Family

## 2023-11-28 ENCOUNTER — Encounter: Payer: Self-pay | Admitting: Hematology

## 2023-11-28 ENCOUNTER — Other Ambulatory Visit: Payer: Self-pay

## 2023-11-28 ENCOUNTER — Other Ambulatory Visit (HOSPITAL_BASED_OUTPATIENT_CLINIC_OR_DEPARTMENT_OTHER): Payer: Self-pay

## 2023-11-29 NOTE — Assessment & Plan Note (Signed)
 Ozempic  effective initially but appetite suppression diminishes after 3-4 days. Phentermine  previously beneficial for energy & appetite suppression. - Refill Ozempic  2mg  qweek. - Prescribe phentermine  18.75mg  qd starting on 4th day after Ozempic  injection to curb hunger. - Encourage small, frequent meals, and 2L water intake qd. - F/U in 1 month

## 2023-11-29 NOTE — Assessment & Plan Note (Signed)
 Increased anxiety near cancer diagnosis anniversary. Bupropion  caused leg cramps. Trintellix  retry considered, tolerated, but too expensive as deductible not met, thinks it is now met. Propranolol for situational anxiety. - Prescribe Trintellix  5mg  qam after eating. Look for online coupon to use at pharmacy. - Prescribe propranolol 10mg  tid prn for situational anxiety. - Schedule follow-up appointment in one month.

## 2023-12-19 ENCOUNTER — Encounter: Payer: Self-pay | Admitting: Family

## 2023-12-19 DIAGNOSIS — E10A2 Type 1 diabetes mellitus, presymptomatic, stage 2: Secondary | ICD-10-CM

## 2023-12-20 NOTE — Telephone Encounter (Signed)
 Need to look at old notes - but think ozempic  is only one that was covered for prediabetes. Zepbound & Wegovy  are ONLY for weight loss - she would have to check her insurance if they cover those meds for weight loss only. Thx

## 2023-12-23 ENCOUNTER — Other Ambulatory Visit (HOSPITAL_BASED_OUTPATIENT_CLINIC_OR_DEPARTMENT_OTHER): Payer: Self-pay

## 2023-12-23 ENCOUNTER — Other Ambulatory Visit: Payer: Self-pay

## 2023-12-23 ENCOUNTER — Other Ambulatory Visit: Payer: Self-pay | Admitting: Family

## 2023-12-23 DIAGNOSIS — K2960 Other gastritis without bleeding: Secondary | ICD-10-CM

## 2023-12-23 MED ORDER — PANTOPRAZOLE SODIUM 20 MG PO TBEC
20.0000 mg | DELAYED_RELEASE_TABLET | Freq: Every day | ORAL | 0 refills | Status: DC
  Filled 2023-12-23: qty 30, 30d supply, fill #0
  Filled 2024-01-20: qty 30, 30d supply, fill #1

## 2023-12-25 ENCOUNTER — Other Ambulatory Visit (HOSPITAL_BASED_OUTPATIENT_CLINIC_OR_DEPARTMENT_OTHER): Payer: Self-pay

## 2023-12-25 MED ORDER — TIRZEPATIDE 2.5 MG/0.5ML ~~LOC~~ SOAJ
2.5000 mg | SUBCUTANEOUS | 1 refills | Status: DC
  Filled 2023-12-25: qty 2, 28d supply, fill #0

## 2023-12-25 NOTE — Telephone Encounter (Signed)
 Patient messaged with Stephanie's recommendation's.

## 2023-12-25 NOTE — Telephone Encounter (Signed)
 Her last A1C was just 5.9 2 years ago, she can come in to have rechecked, but very doubtful it will be elevated since on the Ozempic  for almost a year. I will send in Mounjaro (under her prediabetes dx) and then can try Zepbound and ask for pharmacy to start PA, but b/c she doesn't meet the exact criteria, most likely none of these will be covered. She can purchase a 30 day vial direct from the manufacturer [employer]) but this $349 per month. Just like to offer that option.

## 2023-12-26 ENCOUNTER — Other Ambulatory Visit (HOSPITAL_BASED_OUTPATIENT_CLINIC_OR_DEPARTMENT_OTHER): Payer: Self-pay

## 2023-12-26 ENCOUNTER — Other Ambulatory Visit: Payer: Self-pay

## 2023-12-31 ENCOUNTER — Other Ambulatory Visit (HOSPITAL_BASED_OUTPATIENT_CLINIC_OR_DEPARTMENT_OTHER): Payer: Self-pay

## 2024-01-13 ENCOUNTER — Encounter: Payer: Self-pay | Admitting: Family

## 2024-01-13 DIAGNOSIS — E10A2 Type 1 diabetes mellitus, presymptomatic, stage 2: Secondary | ICD-10-CM

## 2024-01-13 NOTE — Telephone Encounter (Signed)
 Please see pt msg and advise on increase

## 2024-01-14 MED ORDER — TIRZEPATIDE 5 MG/0.5ML ~~LOC~~ SOAJ
5.0000 mg | SUBCUTANEOUS | 0 refills | Status: DC
  Filled 2024-01-14: qty 2, 28d supply, fill #0

## 2024-01-15 ENCOUNTER — Other Ambulatory Visit (HOSPITAL_BASED_OUTPATIENT_CLINIC_OR_DEPARTMENT_OTHER): Payer: Self-pay

## 2024-01-15 ENCOUNTER — Other Ambulatory Visit: Payer: Self-pay

## 2024-01-15 ENCOUNTER — Other Ambulatory Visit: Payer: Self-pay | Admitting: Family

## 2024-01-15 ENCOUNTER — Encounter: Payer: Self-pay | Admitting: Hematology

## 2024-01-15 DIAGNOSIS — R252 Cramp and spasm: Secondary | ICD-10-CM

## 2024-01-15 MED ORDER — NAPROXEN 500 MG PO TABS
500.0000 mg | ORAL_TABLET | Freq: Two times a day (BID) | ORAL | 0 refills | Status: DC
  Filled 2024-01-15: qty 60, 30d supply, fill #0

## 2024-01-16 ENCOUNTER — Other Ambulatory Visit: Payer: Self-pay | Admitting: Family

## 2024-01-16 DIAGNOSIS — E559 Vitamin D deficiency, unspecified: Secondary | ICD-10-CM

## 2024-01-20 ENCOUNTER — Other Ambulatory Visit: Payer: Self-pay

## 2024-01-20 ENCOUNTER — Other Ambulatory Visit (HOSPITAL_BASED_OUTPATIENT_CLINIC_OR_DEPARTMENT_OTHER): Payer: Self-pay

## 2024-01-27 ENCOUNTER — Other Ambulatory Visit: Payer: Self-pay

## 2024-01-27 ENCOUNTER — Other Ambulatory Visit (HOSPITAL_BASED_OUTPATIENT_CLINIC_OR_DEPARTMENT_OTHER): Payer: Self-pay

## 2024-01-27 MED ORDER — IMVEXXY STARTER PACK 10 MCG VA INST
VAGINAL_INSERT | VAGINAL | 0 refills | Status: DC
  Filled 2024-01-27: qty 18, 30d supply, fill #0
  Filled 2024-03-04: qty 36, 30d supply, fill #1
  Filled 2024-03-05: qty 18, 30d supply, fill #1

## 2024-01-27 MED ORDER — PROGESTERONE 200 MG PO CAPS
200.0000 mg | ORAL_CAPSULE | Freq: Every day | ORAL | 0 refills | Status: DC
  Filled 2024-01-27: qty 30, 30d supply, fill #0
  Filled 2024-03-04: qty 30, 30d supply, fill #1

## 2024-01-28 ENCOUNTER — Other Ambulatory Visit (HOSPITAL_BASED_OUTPATIENT_CLINIC_OR_DEPARTMENT_OTHER): Payer: Self-pay

## 2024-02-05 ENCOUNTER — Ambulatory Visit: Admitting: Family

## 2024-02-07 ENCOUNTER — Other Ambulatory Visit (HOSPITAL_BASED_OUTPATIENT_CLINIC_OR_DEPARTMENT_OTHER): Payer: Self-pay

## 2024-02-07 ENCOUNTER — Other Ambulatory Visit: Payer: Self-pay | Admitting: Family

## 2024-02-07 ENCOUNTER — Other Ambulatory Visit: Payer: Self-pay

## 2024-02-07 DIAGNOSIS — F32A Depression, unspecified: Secondary | ICD-10-CM

## 2024-02-07 DIAGNOSIS — E10A2 Type 1 diabetes mellitus, presymptomatic, stage 2: Secondary | ICD-10-CM

## 2024-02-07 MED ORDER — VORTIOXETINE HBR 5 MG PO TABS
5.0000 mg | ORAL_TABLET | Freq: Every day | ORAL | 1 refills | Status: AC
  Filled 2024-02-07: qty 30, 30d supply, fill #0
  Filled 2024-04-08: qty 30, 30d supply, fill #1

## 2024-02-07 MED ORDER — MOUNJARO 5 MG/0.5ML ~~LOC~~ SOAJ
5.0000 mg | SUBCUTANEOUS | 0 refills | Status: DC
  Filled 2024-02-07: qty 2, 28d supply, fill #0

## 2024-02-07 MED ORDER — PROPRANOLOL HCL 10 MG PO TABS
10.0000 mg | ORAL_TABLET | Freq: Two times a day (BID) | ORAL | 0 refills | Status: DC | PRN
  Filled 2024-02-07: qty 30, 15d supply, fill #0

## 2024-03-02 ENCOUNTER — Telehealth: Payer: Self-pay | Admitting: Hematology

## 2024-03-04 ENCOUNTER — Other Ambulatory Visit (HOSPITAL_BASED_OUTPATIENT_CLINIC_OR_DEPARTMENT_OTHER): Payer: Self-pay

## 2024-03-04 ENCOUNTER — Encounter: Payer: Self-pay | Admitting: Hematology

## 2024-03-04 ENCOUNTER — Other Ambulatory Visit: Payer: Self-pay | Admitting: Family

## 2024-03-04 ENCOUNTER — Other Ambulatory Visit: Payer: Self-pay

## 2024-03-04 DIAGNOSIS — K2960 Other gastritis without bleeding: Secondary | ICD-10-CM

## 2024-03-04 DIAGNOSIS — E10A2 Type 1 diabetes mellitus, presymptomatic, stage 2: Secondary | ICD-10-CM

## 2024-03-04 DIAGNOSIS — E119 Type 2 diabetes mellitus without complications: Secondary | ICD-10-CM

## 2024-03-05 ENCOUNTER — Other Ambulatory Visit (HOSPITAL_BASED_OUTPATIENT_CLINIC_OR_DEPARTMENT_OTHER): Payer: Self-pay

## 2024-03-06 ENCOUNTER — Other Ambulatory Visit (HOSPITAL_BASED_OUTPATIENT_CLINIC_OR_DEPARTMENT_OTHER): Payer: Self-pay

## 2024-03-06 MED ORDER — MOUNJARO 5 MG/0.5ML ~~LOC~~ SOAJ
5.0000 mg | SUBCUTANEOUS | 0 refills | Status: DC
  Filled 2024-03-06 – 2024-03-09 (×2): qty 2, 28d supply, fill #0

## 2024-03-06 MED ORDER — METFORMIN HCL ER 500 MG PO TB24
500.0000 mg | ORAL_TABLET | Freq: Every evening | ORAL | 0 refills | Status: DC
  Filled 2024-03-06: qty 30, 30d supply, fill #0

## 2024-03-06 MED ORDER — PANTOPRAZOLE SODIUM 20 MG PO TBEC
20.0000 mg | DELAYED_RELEASE_TABLET | Freq: Every day | ORAL | 0 refills | Status: DC
  Filled 2024-03-06: qty 30, 30d supply, fill #0

## 2024-03-07 ENCOUNTER — Other Ambulatory Visit (HOSPITAL_BASED_OUTPATIENT_CLINIC_OR_DEPARTMENT_OTHER): Payer: Self-pay

## 2024-03-09 ENCOUNTER — Other Ambulatory Visit: Payer: 59

## 2024-03-09 ENCOUNTER — Other Ambulatory Visit

## 2024-03-09 ENCOUNTER — Other Ambulatory Visit (HOSPITAL_BASED_OUTPATIENT_CLINIC_OR_DEPARTMENT_OTHER): Payer: Self-pay

## 2024-03-09 MED ORDER — ESTRADIOL 10 MCG VA TABS
10.0000 ug | ORAL_TABLET | VAGINAL | 3 refills | Status: DC
  Filled 2024-03-09: qty 8, 28d supply, fill #0

## 2024-03-12 ENCOUNTER — Ambulatory Visit: Admitting: Family

## 2024-03-16 ENCOUNTER — Other Ambulatory Visit (HOSPITAL_BASED_OUTPATIENT_CLINIC_OR_DEPARTMENT_OTHER): Payer: Self-pay

## 2024-03-16 MED ORDER — PROGESTERONE 200 MG PO CAPS
200.0000 mg | ORAL_CAPSULE | Freq: Every day | ORAL | 0 refills | Status: DC
  Filled 2024-03-16: qty 90, 90d supply, fill #0
  Filled 2024-04-08: qty 30, 30d supply, fill #0
  Filled 2024-05-07: qty 30, 30d supply, fill #1
  Filled 2024-06-09: qty 30, 30d supply, fill #2

## 2024-03-16 MED ORDER — ESTRADIOL 0.5 MG PO TABS
0.5000 mg | ORAL_TABLET | Freq: Every day | ORAL | 0 refills | Status: DC
  Filled 2024-03-16: qty 30, 30d supply, fill #0
  Filled 2024-04-08: qty 30, 30d supply, fill #1
  Filled 2024-05-10: qty 30, 30d supply, fill #2

## 2024-03-16 MED ORDER — ESTRADIOL 10 MCG VA TABS
10.0000 ug | ORAL_TABLET | VAGINAL | 3 refills | Status: DC
  Filled 2024-04-08: qty 8, 28d supply, fill #0

## 2024-03-18 ENCOUNTER — Telehealth: Payer: Self-pay | Admitting: Hematology

## 2024-03-20 ENCOUNTER — Ambulatory Visit: Payer: 59 | Admitting: Hematology

## 2024-03-26 ENCOUNTER — Other Ambulatory Visit (HOSPITAL_COMMUNITY): Payer: Self-pay

## 2024-04-07 ENCOUNTER — Ambulatory Visit: Payer: Self-pay

## 2024-04-07 NOTE — Telephone Encounter (Signed)
 Reviewed

## 2024-04-07 NOTE — Telephone Encounter (Signed)
 FYI Only or Action Required?: FYI only for provider.  Patient was last seen in primary care on 11/27/2023 by Lucius Krabbe, NP.  Called Nurse Triage reporting Hip Pain.  Symptoms began several days ago.  Interventions attempted: OTC medications: Naproxen , Tylenol ..  Symptoms are: unchanged.  Triage Disposition: See HCP Within 4 Hours (Or PCP Triage)  Patient/caregiver understands and will follow disposition?: Yes  **Appt. Scheduled for 10/15**      Reason for Disposition  [1] SEVERE pain (e.g., excruciating, unable to do any normal activities) AND [2] not improved after 2 hours of pain medicine  Answer Assessment - Initial Assessment Questions 1. LOCATION and RADIATION: Where is the pain located? Does the pain spread (shoot) anywhere else?     Right hip, radiates to pelvis and groin area; right leg aching (no pian)  2. QUALITY: What does the pain feel like?  (e.g., sharp, dull, aching, burning)     Hip-sharp pain, right leg- aching, last night she almost fell to the floor  3. SEVERITY: How bad is the pain? What does it keep you from doing?   (Scale 1-10; or mild, moderate, severe)     Severe, 8/10  4. ONSET: When did the pain start? Does it come and go, or is it there all the time?     Last Saturday   5. WORK OR EXERCISE: Has there been any recent work or exercise that involved this part of the body?      No   6. CAUSE: What do you think is causing the hip pain?       She stated she has issues with sciatic nerve pain, and tightness in hips, was seen previously by Physical Therapy.   7. AGGRAVATING FACTORS: What makes the hip pain worse? (e.g., walking, climbing stairs, running)     Movement throughout the day makes the pain worse  8. OTHER SYMPTOMS: Do you have any other symptoms? (e.g., back pain, pain shooting down leg,  fever, rash)     Lower back right above buttocks, she feels a muscular knot. She suspects swelling in the right side, by  the knot. No redness noted, no warmth to the touch   For pain patient is taking Naproxen , Tylenol . Some relief noted. Pt. Scheduled for 10/15 at earlier time than previously scheduled 10/15 at 4:30pm. Pt. Agrees with plan of care and will seek care today in UC/ED if symptoms worsen.  Protocols used: Hip Pain-A-AH

## 2024-04-08 ENCOUNTER — Other Ambulatory Visit (HOSPITAL_BASED_OUTPATIENT_CLINIC_OR_DEPARTMENT_OTHER): Payer: Self-pay

## 2024-04-08 ENCOUNTER — Other Ambulatory Visit: Payer: Self-pay | Admitting: Family

## 2024-04-08 ENCOUNTER — Ambulatory Visit (INDEPENDENT_AMBULATORY_CARE_PROVIDER_SITE_OTHER): Admitting: Family

## 2024-04-08 ENCOUNTER — Ambulatory Visit: Admitting: Family

## 2024-04-08 ENCOUNTER — Encounter: Payer: Self-pay | Admitting: Family

## 2024-04-08 VITALS — BP 102/68 | HR 90 | Temp 97.9°F | Ht 63.0 in | Wt 227.8 lb

## 2024-04-08 DIAGNOSIS — M25551 Pain in right hip: Secondary | ICD-10-CM

## 2024-04-08 DIAGNOSIS — E10A2 Type 1 diabetes mellitus, presymptomatic, stage 2: Secondary | ICD-10-CM

## 2024-04-08 DIAGNOSIS — K2960 Other gastritis without bleeding: Secondary | ICD-10-CM | POA: Diagnosis not present

## 2024-04-08 DIAGNOSIS — F32A Depression, unspecified: Secondary | ICD-10-CM

## 2024-04-08 DIAGNOSIS — Z1211 Encounter for screening for malignant neoplasm of colon: Secondary | ICD-10-CM | POA: Diagnosis not present

## 2024-04-08 LAB — COMPREHENSIVE METABOLIC PANEL WITH GFR
ALT: 15 U/L (ref 0–35)
AST: 21 U/L (ref 0–37)
Albumin: 4.4 g/dL (ref 3.5–5.2)
Alkaline Phosphatase: 53 U/L (ref 39–117)
BUN: 10 mg/dL (ref 6–23)
CO2: 30 meq/L (ref 19–32)
Calcium: 9.4 mg/dL (ref 8.4–10.5)
Chloride: 102 meq/L (ref 96–112)
Creatinine, Ser: 1.02 mg/dL (ref 0.40–1.20)
GFR: 66.4 mL/min (ref >60.00)
Glucose, Bld: 95 mg/dL (ref 70–99)
Potassium: 4.8 meq/L (ref 3.5–5.1)
Sodium: 139 meq/L (ref 135–145)
Total Bilirubin: 0.4 mg/dL (ref 0.2–1.2)
Total Protein: 7.2 g/dL (ref 6.0–8.3)

## 2024-04-08 LAB — HEMOGLOBIN A1C: Hgb A1c MFr Bld: 5.9 % (ref 4.6–6.5)

## 2024-04-08 LAB — MICROALBUMIN / CREATININE URINE RATIO
Creatinine,U: 144.5 mg/dL
Microalb Creat Ratio: UNDETERMINED mg/g (ref 0.0–30.0)
Microalb, Ur: <0.7 mg/dL

## 2024-04-08 MED ORDER — METFORMIN HCL ER 500 MG PO TB24
500.0000 mg | ORAL_TABLET | Freq: Every evening | ORAL | 0 refills | Status: DC
  Filled 2024-04-08: qty 30, 30d supply, fill #0
  Filled 2024-05-07: qty 30, 30d supply, fill #1
  Filled 2024-06-26: qty 30, 30d supply, fill #2

## 2024-04-08 MED ORDER — NAPROXEN 500 MG PO TABS
500.0000 mg | ORAL_TABLET | Freq: Two times a day (BID) | ORAL | 0 refills | Status: DC
  Filled 2024-04-08: qty 30, 15d supply, fill #0

## 2024-04-08 MED ORDER — TIRZEPATIDE 7.5 MG/0.5ML ~~LOC~~ SOAJ
7.5000 mg | SUBCUTANEOUS | 1 refills | Status: DC
  Filled 2024-04-08: qty 2, 28d supply, fill #0
  Filled 2024-05-07: qty 2, 28d supply, fill #1

## 2024-04-08 MED ORDER — PANTOPRAZOLE SODIUM 20 MG PO TBEC
20.0000 mg | DELAYED_RELEASE_TABLET | Freq: Every day | ORAL | 5 refills | Status: AC
  Filled 2024-04-08: qty 30, 20d supply, fill #0
  Filled 2024-04-27: qty 30, 20d supply, fill #1
  Filled 2024-05-14: qty 30, 20d supply, fill #2
  Filled 2024-06-26: qty 30, 20d supply, fill #3
  Filled 2024-07-13: qty 30, 20d supply, fill #4

## 2024-04-08 NOTE — Assessment & Plan Note (Signed)
 pt reports abdominal sx are much better taking the Protonix  qd. Concerned that new coughing sx may be related to worsening reflux, as postnasal drip and polyps have been ruled out. Advising to do a trial of increased bid dosing for 1 week to see if sx improve. Let me know if working or not.  Orders:   pantoprazole  (PROTONIX ) 20 MG tablet; Take 1 tablet (20 mg total) by mouth daily. Take twice a day for a week to see if helps cough, then reduce to daily.

## 2024-04-08 NOTE — Assessment & Plan Note (Signed)
 Mounjaro  effective initially but appetite suppression diminishes after 3-4 days. Has been on 5mg  dose for 2 mos, would like to increase.  - Check labs today. - Increase Mounjaro  to 7.5mg  qweek. Advised on same SE to watch for.  - Refilling Metformin  500mg  qd. - Encourage small, frequent meals, and 2L water intake qd. - F/U in 2 months Orders:   Urine Albumin/Creatinine with ratio (send out) [LAB689]   HgB A1c   tirzepatide  (MOUNJARO ) 7.5 MG/0.5ML Pen; Inject 7.5 mg into the skin once a week.   metFORMIN  (GLUCOPHAGE -XR) 500 MG 24 hr tablet; Take 1 tablet (500 mg total) by mouth every evening.   Comp Met (CMET)

## 2024-04-08 NOTE — Progress Notes (Signed)
 Patient ID: Bonnie Larsen, female    DOB: Sep 09, 1980, 43 y.o.   MRN: [id number]  Chief Complaint  Patient presents with   Hip Pain    Pt c/o right hip pain radiating up to pelvic area, Present since last week and worsened on Saturday. Pt states pain when sitting long periods of time and lifting leg.  Has tried tylenol  and baclofen , which does help slightly.    Diabetes  HPI: Right hip pain started a week ago, feels sharp stabbing pains when moving a certain way or lifting her leg up. Has difficulty bending over to put shoes and socks on. Denies any injury, has been stretching some, but nothing in excess that she thinks would cause the pain. Tenderness with palpation. No history of any hip injury.  She has been using Mounjaro  for about 4 months, the last 2 mos on 5mg  dose, which suppresses her appetite for the first two to three days post-administration, but her appetite returns by the end of the week. Previously, she used Ozempic  & Victoza , which consistently suppressed her appetite but led to a plateau in weight loss and headaches. She administers on Sundays, with reduced appetite until about Wednesday or Thursday.   She also complains of choking sensation that comes on suddenly. She experienced this during our visit. She is talking and then starts coughing persistently for 1-2 minutes. Also happens when swallowing or eating sometimes. She has seen ENT in past who states did not see any reflux or postnasal drip sx. She does have allergies and was recommended to see an allergist. She has had reflux in past and is taking Protonix  20mg  qd.  ASSESSMENT & PLAN  Assessment & Plan Screening for colon cancer  Orders:   Ambulatory referral to Gastroenterology  Acute right hip pain Start applying ice tid for up to 20 minutes for the next week. Ok to continue Aleve  topical, also recommend OTC Voltaren gel (Diclofenac sodium) can apply up to 4x/day. Call office if pain is no better after another 1-2  weeks. Orders:   naproxen  (NAPROSYN ) 500 MG tablet; Take 1 tablet (500 mg total) by mouth 2 (two) times daily with a meal. Take for 5 days then as needed.  Reflux gastritis pt reports abdominal sx are much better taking the Protonix  qd. Concerned that new coughing sx may be related to worsening reflux, as postnasal drip and polyps have been ruled out. Advising to do a trial of increased bid dosing for 1 week to see if sx improve. Let me know if working or not.  Orders:   pantoprazole  (PROTONIX ) 20 MG tablet; Take 1 tablet (20 mg total) by mouth daily. Take twice a day for a week to see if helps cough, then reduce to daily.   Stage 2 presymptomatic dysglycemic type 1 diabetes mellitus Mounjaro  effective initially but appetite suppression diminishes after 3-4 days. Has been on 5mg  dose for 2 mos, would like to increase.  - Check labs today. - Increase Mounjaro  to 7.5mg  qweek. Advised on same SE to watch for.  - Refilling Metformin  500mg  qd. - Encourage small, frequent meals, and 2L water intake qd. - F/U in 2 months Orders:   Urine Albumin/Creatinine with ratio (send out) [LAB689]   HgB A1c   tirzepatide  (MOUNJARO ) 7.5 MG/0.5ML Pen; Inject 7.5 mg into the skin once a week.   metFORMIN  (GLUCOPHAGE -XR) 500 MG 24 hr tablet; Take 1 tablet (500 mg total) by mouth every evening.   Comp Met (CMET)  Subjective:    Outpatient Medications Prior to Visit  Medication Sig Dispense Refill   baclofen  (LIORESAL ) 10 MG tablet Take 1-2 tablets (10-20 mg total) by mouth at bedtime as needed for muscle spasms. 60 each 12   Cholecalciferol (VITAMIN D3) 1.25 MG (50000 UT) CAPS Take 1 capsule by mouth once a week 12 capsule 0   estradiol  (ESTRACE ) 0.5 MG tablet Take 1 tablet (0.5 mg total) by mouth daily. 90 tablet 0   fluticasone  (FLONASE ) 50 MCG/ACT nasal spray Place 2 sprays into both nostrils daily. 16 g 6   hydrochlorothiazide  (HYDRODIURIL ) 12.5 MG tablet Take 1 tablet (12.5 mg total) by mouth daily  as needed. 90 tablet 1   levonorgestrel  (MIRENA ) 20 MCG/24HR IUD 1 each by Intrauterine route once.     MAGNESIUM PO Take by mouth.     Multiple Vitamin (MULTIVITAMIN) tablet Take 1 tablet by mouth daily.     ondansetron  (ZOFRAN ) 4 MG tablet TAKE 1 TABLET BY MOUTH EVERY 8 HOURS AS NEEDED FOR NAUSEA OR VOMITING 20 tablet 0   POTASSIUM PO Take by mouth.     progesterone  (PROMETRIUM ) 200 MG capsule Take 1 capsule (200 mg total) by mouth at bedtime. 90 capsule 0   propranolol  (INDERAL ) 10 MG tablet Take 1 tablet (10 mg total) by mouth 2 (two) times daily as needed (For anxiety.). 30 tablet 0   vortioxetine  HBr (TRINTELLIX ) 5 MG TABS tablet Take 1 tablet (5 mg total) by mouth daily after breakfast. 30 tablet 1   Estradiol  10 MCG TABS vaginal tablet Place 1 tablet (10 mcg total) vaginally 2 (two) times a week. 24 tablet 3   metFORMIN  (GLUCOPHAGE -XR) 500 MG 24 hr tablet Take 1 tablet (500 mg total) by mouth every evening. 90 tablet 0   naproxen  (NAPROSYN ) 500 MG tablet Take 1 tablet (500 mg total) by mouth 2 (two) times daily with a meal. For leg pain. 60 tablet 0   pantoprazole  (PROTONIX ) 20 MG tablet Take 1 tablet (20 mg total) by mouth daily. 60 tablet 0   phentermine  (ADIPEX-P ) 37.5 MG tablet Take 0.5 tablets (18.75 mg total) by mouth daily after breakfast. Take on 4th day after Ozempic  injection to curb hunger. 30 tablet 0   progesterone  (PROMETRIUM ) 200 MG capsule Take 1 capsule (200 mg total) by mouth at bedtime. 90 capsule 0   tirzepatide  (MOUNJARO ) 5 MG/0.5ML Pen Inject 5 mg into the skin once a week. 2 mL 0   Estradiol  10 MCG TABS vaginal tablet Place 1 tablet (10 mcg total) vaginally 2 (two) times a week. 24 tablet 3   levocetirizine (XYZAL  ALLERGY 24HR) 5 MG tablet Take 1 tablet (5 mg total) by mouth every evening. 30 tablet 3   estradiol  (VIVELLE -DOT) 0.075 MG/24HR Place 1 patch onto the skin 2 (two) times a week. 8 patch 2   NEEDLE, DISP, 30 G 30G X 1/2 MISC Inject 1 each as directed  daily. 50 each 0   Facility-Administered Medications Prior to Visit  Medication Dose Route Frequency Provider Last Rate Last Admin   sodium chloride  flush (NS) 0.9 % injection 10 mL  10 mL Intravenous PRN Kale, Gautam Kishore, MD   10 mL at 04/16/17 1938   Past Medical History:  Diagnosis Date   Adenopathy 01/10/2017   Upper abdominal. Per CT   Anemia    Anxiety    Bacterial pharyngitis 07/30/2016   Counseling regarding advanced care planning and goals of care 06/24/2017   Depression    Difficult  intravenous access    Family history of adverse reaction to anesthesia    pt's mother has hx. 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   Seasonal allergies    Past Surgical History:  Procedure Laterality Date   LYMPH NODE BIOPSY N/A 02/11/2017   Procedure: HAND ASSISTED LAPAROSCOPIC LYMPH NODE BIOPSY;  Surgeon: Aron Shoulders, MD;  Location: [facility] OR;  Service: General;  Laterality: N/A;   PORT-A-CATH REMOVAL N/A 09/04/2017   Procedure: REMOVAL PORT-A-CATH;  Surgeon: Aron Shoulders, MD;  Location: [facility] OR;  Service: General;  Laterality: N/A;   PORTACATH PLACEMENT N/A 02/20/2017   Procedure: INSERTION PORT-A-CATH;  Surgeon: Aron Shoulders, MD;  Location: [facility];  Service: General;  Laterality: N/A;   TUBAL LIGATION     UMBILICAL HERNIA REPAIR  02/11/2017   Allergies  Allergen Reactions   Clindamycin /Lincomycin Swelling    FACIAL SWELLING   Morphine     Cymbalta  [Duloxetine  Hcl] Nausea Only    Causes pt to have nausea and stomach cramping.      Objective:    Physical Exam Vitals and nursing note reviewed.  Constitutional:       Appearance: Normal appearance. She is obese.  Cardiovascular:     Rate and Rhythm: Normal rate and regular rhythm.  Pulmonary:     Effort: Pulmonary effort is normal.     Breath sounds: Normal breath sounds.  Musculoskeletal:     Right hip: Tenderness and bony tenderness present. Decreased range of motion. Normal strength.  Skin:    General: Skin is warm and dry.  Neurological:     Mental Status: She is alert.  Psychiatric:        Mood and Affect: Mood normal.        Behavior: Behavior normal.    BP 102/68 (BP Location: Left Arm, Patient Position: Sitting, Cuff Size: Large)   Pulse 90   Temp 97.9 F (36.6 C) (Temporal)   Ht 5' 3 (1.6 m)   Wt 227 lb 12.8 oz (103.3 kg)   SpO2 98%   BMI 40.35 kg/m  Wt Readings from Last 3 Encounters:  04/08/24 227 lb 12.8 oz (103.3 kg)  11/27/23 226 lb 3.2 oz (102.6 kg)  09/23/23 224 lb (101.6 kg)      Lucius Krabbe, NP

## 2024-04-09 ENCOUNTER — Ambulatory Visit: Payer: Self-pay | Admitting: Family

## 2024-04-10 ENCOUNTER — Other Ambulatory Visit (HOSPITAL_BASED_OUTPATIENT_CLINIC_OR_DEPARTMENT_OTHER): Payer: Self-pay

## 2024-04-10 ENCOUNTER — Encounter: Payer: Self-pay | Admitting: Family

## 2024-04-10 MED ORDER — PROPRANOLOL HCL 10 MG PO TABS
10.0000 mg | ORAL_TABLET | Freq: Two times a day (BID) | ORAL | 0 refills | Status: DC | PRN
  Filled 2024-04-10: qty 30, 15d supply, fill #0

## 2024-04-17 ENCOUNTER — Inpatient Hospital Stay

## 2024-04-17 ENCOUNTER — Encounter: Payer: Self-pay | Admitting: Family

## 2024-04-17 ENCOUNTER — Inpatient Hospital Stay: Attending: Hematology | Admitting: Hematology

## 2024-04-17 VITALS — BP 105/60 | HR 92 | Temp 97.8°F | Resp 17 | Ht 63.0 in | Wt 227.0 lb

## 2024-04-17 DIAGNOSIS — C8100 Nodular lymphocyte predominant Hodgkin lymphoma, unspecified site: Secondary | ICD-10-CM | POA: Insufficient documentation

## 2024-04-17 DIAGNOSIS — Z885 Allergy status to narcotic agent status: Secondary | ICD-10-CM | POA: Diagnosis not present

## 2024-04-17 DIAGNOSIS — Z888 Allergy status to other drugs, medicaments and biological substances status: Secondary | ICD-10-CM | POA: Diagnosis not present

## 2024-04-17 DIAGNOSIS — Z8249 Family history of ischemic heart disease and other diseases of the circulatory system: Secondary | ICD-10-CM | POA: Insufficient documentation

## 2024-04-17 DIAGNOSIS — R059 Cough, unspecified: Secondary | ICD-10-CM | POA: Insufficient documentation

## 2024-04-17 DIAGNOSIS — Z806 Family history of leukemia: Secondary | ICD-10-CM | POA: Insufficient documentation

## 2024-04-17 DIAGNOSIS — Z79899 Other long term (current) drug therapy: Secondary | ICD-10-CM | POA: Insufficient documentation

## 2024-04-17 DIAGNOSIS — Z803 Family history of malignant neoplasm of breast: Secondary | ICD-10-CM | POA: Insufficient documentation

## 2024-04-17 DIAGNOSIS — Z881 Allergy status to other antibiotic agents status: Secondary | ICD-10-CM | POA: Diagnosis not present

## 2024-04-17 DIAGNOSIS — E042 Nontoxic multinodular goiter: Secondary | ICD-10-CM | POA: Insufficient documentation

## 2024-04-17 DIAGNOSIS — Z833 Family history of diabetes mellitus: Secondary | ICD-10-CM | POA: Diagnosis not present

## 2024-04-17 DIAGNOSIS — C8108 Nodular lymphocyte predominant Hodgkin lymphoma, lymph nodes of multiple sites: Secondary | ICD-10-CM | POA: Diagnosis not present

## 2024-04-17 DIAGNOSIS — Z8379 Family history of other diseases of the digestive system: Secondary | ICD-10-CM | POA: Diagnosis not present

## 2024-04-17 DIAGNOSIS — Z8269 Family history of other diseases of the musculoskeletal system and connective tissue: Secondary | ICD-10-CM | POA: Diagnosis not present

## 2024-04-17 DIAGNOSIS — Z809 Family history of malignant neoplasm, unspecified: Secondary | ICD-10-CM | POA: Diagnosis not present

## 2024-04-17 DIAGNOSIS — Z8261 Family history of arthritis: Secondary | ICD-10-CM | POA: Diagnosis not present

## 2024-04-17 LAB — CMP (CANCER CENTER ONLY)
ALT: 9 U/L (ref 0–44)
AST: 14 U/L — ABNORMAL LOW (ref 15–41)
Albumin: 4.3 g/dL (ref 3.5–5.0)
Alkaline Phosphatase: 58 U/L (ref 38–126)
Anion gap: 6 (ref 5–15)
BUN: 15 mg/dL (ref 6–20)
CO2: 28 mmol/L (ref 22–32)
Calcium: 9.5 mg/dL (ref 8.9–10.3)
Chloride: 105 mmol/L (ref 98–111)
Creatinine: 1.04 mg/dL — ABNORMAL HIGH (ref 0.44–1.00)
GFR, Estimated: >60 mL/min (ref >60)
Glucose, Bld: 87 mg/dL (ref 70–99)
Potassium: 4.4 mmol/L (ref 3.5–5.1)
Sodium: 139 mmol/L (ref 135–145)
Total Bilirubin: 0.3 mg/dL (ref 0.0–1.2)
Total Protein: 7.1 g/dL (ref 6.5–8.1)

## 2024-04-17 LAB — CBC WITH DIFFERENTIAL (CANCER CENTER ONLY)
Abs Immature Granulocytes: 0.01 K/uL (ref 0.00–0.07)
Basophils Absolute: 0 K/uL (ref 0.0–0.1)
Basophils Relative: 0 %
Eosinophils Absolute: 0.1 K/uL (ref 0.0–0.5)
Eosinophils Relative: 3 %
HCT: 42 % (ref 36.0–46.0)
Hemoglobin: 13.8 g/dL (ref 12.0–15.0)
Immature Granulocytes: 0 %
Lymphocytes Relative: 26 %
Lymphs Abs: 1.4 K/uL (ref 0.7–4.0)
MCH: 26.2 pg (ref 26.0–34.0)
MCHC: 32.9 g/dL (ref 30.0–36.0)
MCV: 79.7 fL — ABNORMAL LOW (ref 80.0–100.0)
Monocytes Absolute: 0.5 K/uL (ref 0.1–1.0)
Monocytes Relative: 10 %
Neutro Abs: 3.4 K/uL (ref 1.7–7.7)
Neutrophils Relative %: 61 %
Platelet Count: 240 K/uL (ref 150–400)
RBC: 5.27 MIL/uL — ABNORMAL HIGH (ref 3.87–5.11)
RDW: 14 % (ref 11.5–15.5)
WBC Count: 5.5 K/uL (ref 4.0–10.5)
nRBC: 0 % (ref 0.0–0.2)

## 2024-04-17 LAB — LACTATE DEHYDROGENASE: LDH: 115 U/L (ref 98–192)

## 2024-04-17 NOTE — Progress Notes (Signed)
 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 female who is here today for continued evaluation and management of nodular lymphocyte predominant Hodgkin's lymphoma.  she was last seen by me on 07/16/2023; at the time she mentioned experiencing fatigue, lethargy, and insomnia associated with day-time sleepiness and complained of night sweats and hot flashes, contributed to menopausal symptom  Today, she says that she has been doing well, besides experiencing a persisting dry cough/choking spontaneously up to 3x/ day, occurring during the day and night when eating/talking/drinking - no voice change associated. She says that she is still taking Xyzal  and Flonase , which is helping with post-nasal drip, and does take Protonix  BID, without improvement. Denies throat clearing, cough worsening at night or when eating. Denies hx of asthma - does have hx of bronchitis.  Reports that her scar is becoming more painful and itchy. She did try a silicone dressing and the scar gel, however gel caused rashes. Has previously seen Dermatology, but not for a couple of years.   States that she has been placed on Mirena  and oral HRT for management of vasomotor symptoms. Did have some spotting 2 months ago.   Says that she had an ultrasound for her thyroid  nodules and saw Endocrinology, who apparently placed orders for a thyroid  biopsy but she was never contacted for this.  Denies any fevers/chills or drenching night sweats  REVIEW OF SYSTEMS:     10 Point review of systems of done and is negative except as noted above.  MEDICAL HISTORY Past Medical History:  Diagnosis Date   Adenopathy 01/10/2017   Upper abdominal. Per CT   Anemia    Anxiety    Bacterial pharyngitis 07/30/2016   Counseling regarding advanced care planning and goals of care 06/24/2017   Depression    Difficult intravenous access    Family history of adverse reaction to anesthesia    pt's mother has hx. of post-op N/V and hard to wake up post-op   Foot swelling 07/05/2021   History of panic attacks    02/08/17- has not had a panic attacks   Lymphoma (HCC) 01/2017   Mesenteric lymphadenopathy 01/2017   Morbid obesity with BMI of 40.0-44.9, adult (HCC)    Need for immunization against influenza 05/31/2021   Obesity, Class II, BMI 35-39.9, isolated 04/25/2015   Right upper quadrant abdominal pain 05/31/2021   Runny nose 02/19/2017   clear drainage, per pt.   Seasonal allergies     SURGICAL HISTORY Past Surgical History:  Procedure Laterality Date   LYMPH NODE BIOPSY N/A 02/11/2017   Procedure: HAND ASSISTED LAPAROSCOPIC LYMPH NODE BIOPSY;  Surgeon: Aron Shoulders, MD;  Location: [facility] OR;  Service: General;  Laterality: N/A;   PORT-A-CATH REMOVAL N/A 09/04/2017   Procedure: REMOVAL PORT-A-CATH;  Surgeon: Aron Shoulders, MD;  Location: [facility] OR;  Service: General;  Laterality: N/A;   PORTACATH PLACEMENT N/A 02/20/2017   Procedure: INSERTION PORT-A-CATH;  Surgeon: Aron Shoulders, MD;  Location: [facility];  Service: General;  Laterality: N/A;   TUBAL LIGATION  UMBILICAL HERNIA REPAIR  02/11/2017    SOCIAL HISTORY Social History   Tobacco Use   Smoking status: Never   Smokeless tobacco: Never  Vaping Use   Vaping status: Never Used  Substance Use Topics   Alcohol use: No    Alcohol/week: 0.0 standard drinks of alcohol   Drug use: No    Social History   Social History Narrative   Not on file    SOCIAL DRIVERS OF HEALTH SDOH  Screenings   Food Insecurity: No Food Insecurity (09/03/2017)  Transportation Needs: No Transportation Needs (09/03/2017)  Depression (PHQ2-9): Low Risk  (04/17/2024)  Recent Concern: Depression (PHQ2-9) - Medium Risk (04/08/2024)  Financial Resource Strain: Low Risk  (09/03/2017)  Physical Activity: Inactive (09/03/2017)  Social Connections: Somewhat Isolated (09/03/2017)  Stress: Stress Concern Present (09/03/2017)  Tobacco Use: Low Risk  (04/08/2024)     FAMILY HISTORY Family History  Problem Relation Age of Onset   Cancer Mother    Arthritis Mother    Breast cancer Mother 54   Fibromyalgia Mother    Rheum arthritis Mother    Neuropathy Mother    Anesthesia problems Mother        post-op N/V; hard to wake up post-op   Early death Father    Diabetes Father    COPD Father    Alcohol abuse Father    Congestive Heart Failure Father        died at age 73   Cirrhosis Father    Other Brother        Benign spinal tumor   Heart disease Maternal Grandmother    Diabetes Maternal Grandmother    Cancer Paternal Grandfather        Leukemia     ALLERGIES: is allergic to clindamycin /lincomycin, morphine , and cymbalta  [duloxetine  hcl].  MEDICATIONS  Current Outpatient Medications  Medication Sig Dispense Refill   baclofen  (LIORESAL ) 10 MG tablet Take 1-2 tablets (10-20 mg total) by mouth at bedtime as needed for muscle spasms. 60 each 12   Cholecalciferol (VITAMIN D3) 1.25 MG (50000 UT) CAPS Take 1 capsule by mouth once a week 12 capsule 0   estradiol  (ESTRACE ) 0.5 MG tablet Take 1 tablet (0.5 mg total) by mouth daily. 90 tablet 0   Estradiol  10 MCG TABS vaginal tablet Place 1 tablet (10 mcg total) vaginally 2 (two) times a week. 24 tablet 3   fluticasone  (FLONASE ) 50 MCG/ACT nasal spray Place 2 sprays into both nostrils daily. 16 g 6   hydrochlorothiazide  (HYDRODIURIL ) 12.5 MG tablet Take 1 tablet (12.5 mg total) by mouth daily as needed. 90 tablet 1   levocetirizine (XYZAL  ALLERGY  24HR) 5 MG tablet Take 1 tablet (5 mg total) by mouth every evening. 30 tablet 3   levonorgestrel  (MIRENA ) 20 MCG/24HR IUD 1 each by Intrauterine route once.     MAGNESIUM PO Take by mouth.     metFORMIN  (GLUCOPHAGE -XR) 500 MG 24 hr tablet Take 1 tablet (500 mg total) by mouth every evening. 90 tablet 0   Multiple Vitamin (MULTIVITAMIN) tablet Take 1 tablet by mouth daily.     naproxen  (NAPROSYN ) 500 MG tablet Take 1 tablet (500 mg total) by mouth 2 (two) times daily with a meal. Take for 5 days then as needed. 30 tablet 0   ondansetron  (ZOFRAN ) 4 MG tablet TAKE 1 TABLET BY MOUTH EVERY 8 HOURS AS NEEDED FOR NAUSEA OR VOMITING 20 tablet 0   pantoprazole  (PROTONIX ) 20 MG tablet Take 1 tablet (20 mg total) by  mouth daily. Take twice a day for a week to see if helps cough, then reduce to daily. 60 tablet 5   POTASSIUM PO Take by mouth.     progesterone  (PROMETRIUM ) 200 MG capsule Take 1 capsule (200 mg total) by mouth at bedtime. 90 capsule 0   propranolol  (INDERAL ) 10 MG tablet Take 1 tablet (10 mg total) by mouth 2 (two) times daily as needed (For anxiety.). 30 tablet 0   tirzepatide  (MOUNJARO ) 7.5 MG/0.5ML Pen Inject 7.5 mg into the skin once a week. 2 mL 1   vortioxetine  HBr (TRINTELLIX ) 5 MG TABS tablet Take 1 tablet (5 mg total) by mouth daily after breakfast. 30 tablet 1   No current facility-administered medications for this visit.   Facility-Administered Medications Ordered in Other Visits  Medication Dose Route Frequency Provider Last Rate Last Admin   sodium chloride  flush (NS) 0.9 % injection 10 mL  10 mL Intravenous PRN Aydyn Testerman Kishore, MD   10 mL at 04/16/17 1938    PHYSICAL EXAMINATION: ECOG PERFORMANCE STATUS: 1 - Symptomatic but completely ambulatory VITALS: Vitals:   04/17/24 1446  BP: 105/60  Pulse: 92  Resp: 17  Temp: 97.8 F (36.6 C)  SpO2: 100%   Filed Weights   04/17/24 1446  Weight: 227 lb (103 kg)   Body mass index is 40.21 kg/m.  GENERAL: alert, in  no acute distress and comfortable SKIN: no acute rashes, no significant lesions EYES: conjunctiva are pink and non-injected, sclera anicteric OROPHARYNX: MMM, no exudates, no oropharyngeal erythema or ulceration NECK: supple, no JVD LYMPH:  no palpable lymphadenopathy in the cervical, axillary or inguinal regions LUNGS: clear to auscultation b/l with normal respiratory effort HEART: regular rate & rhythm ABDOMEN:  normoactive bowel sounds , non tender, not distended, no hepatosplenomegaly Extremity: no pedal edema PSYCH: alert & oriented x 3 with fluent speech NEURO: no focal motor/sensory deficits  LABORATORY DATA:   I have reviewed the data as listed     Latest Ref Rng & Units 04/17/2024    2:21 PM 03/18/2023   12:05 PM 07/27/2022   12:53 PM  CBC EXTENDED  WBC 4.0 - 10.5 K/uL 5.5  5.4  5.4   RBC 3.87 - 5.11 MIL/uL 5.27  5.48  5.35   Hemoglobin 12.0 - 15.0 g/dL 86.1  85.8  86.0   HCT 36.0 - 46.0 % 42.0  43.7  42.7   Platelets 150 - 400 K/uL 240  244  260   NEUT# 1.7 - 7.7 K/uL 3.4  3.6  3.5   Lymph# 0.7 - 4.0 K/uL 1.4  1.4  1.3     LDH:  Lab Results  Component Value Date   LDH 115 04/17/2024        Latest Ref Rng & Units 04/08/2024    8:49 AM 04/25/2023    1:50 PM 03/18/2023   12:05 PM  CMP  Glucose 70 - 99 mg/dL 95  89  899   BUN 6 - 23 mg/dL 10  9  13    Creatinine 0.40 - 1.20 mg/dL 8.97  8.94  8.84   Sodium 135 - 145 mEq/L 139  139  139   Potassium 3.5 - 5.1 mEq/L 4.8  4.4  4.1   Chloride 96 - 112 mEq/L 102  101  103   CO2 19 - 32 mEq/L 30  29  27    Calcium 8.4 - 10.5 mg/dL 9.4  9.9  9.6   Total Protein 6.0 - 8.3 g/dL  7.2   7.3   Total Bilirubin 0.2 - 1.2 mg/dL 0.4   0.6   Alkaline Phos 39 - 117 U/L 53   57   AST 0 - 37 U/L 21   15   ALT 0 - 35 U/L 15   12    Lymphnode biopsy, 02/11/2017 Diagnosis Lymph node for lymphoma, Mesenteric Lymphadenopathy - HODGKIN LYMPHOMA.             PROCEDURES   ECHO 03/13/17 Study Conclusions  - Left ventricle: The  cavity size was normal. Systolic function was  normal. The estimated ejection fraction was in the range of 55%  to 60%. Wall motion was normal; there were no regional wall  motion abnormalities. Left ventricular diastolic function  parameters were normal. - Atrial septum: No defect or patent foramen ovale was identified. - Impressions: Normal GLS -20.2.  RADIOGRAPHIC STUDIES: I have personally reviewed the radiological images as listed and agreed with the findings in the report. No results found.   ASSESSMENT & PLAN:  43 y.o. female with 1) Stage IIIA - Nodular Lymphocyte predominant Hodgkins lymphoma -Baseline ECHO from 03/13/17 shows normal heart function, EF 55%-60%   Rpt PET/CT on 05/29/2017 - showed Significant reduction in size and metabolic activity of the formerly extensive adenopathy in the chest and abdomen, with primarily Deauville 3 activity today, and some Deauville 2 activity. Previously this was Deauville 5.   S/p R-CHOP x 6 cycles  PET/CT 08/06/2017: No evidence active lymphoma with minimal activity within the remaining periaortic and mesenteric lymph nodes - Deauville 2   2) Epigastric abdominal discomfort Significantly improved with PPI therapy.   3) Patient Active Problem List   Diagnosis Date Noted   Leg cramping 12/03/2022   Vitamin D  deficiency 12/03/2022   Reflux gastritis 11/15/2021   Generalized anxiety disorder 07/05/2021   Stage 2 presymptomatic dysglycemic type 1 diabetes mellitus 06/08/2021   Morbid obesity (HCC) 05/31/2021   Migraine 09/03/2017   Counseling regarding advanced care planning and goals of care 06/24/2017   Nodular lymphocyte predominant Hodgkin lymphoma of lymph nodes of multiple regions (HCC) 03/19/2017   Mesenteric lymphadenopathy 02/11/2017   Paraspinal mass 01/07/2017   Abnormal chest CT 01/07/2017   History of panic attacks     PLAN: - Discussed lab results on 04/17/2024 in detail with patient: CBC, CMP, and LDH normal.  -  Recommended seeing Dermatology for intralesion steroid injection for her keloid scar as surgical intervention may cause rescarring/keloid   - Discuss with PCP about monitoring thyroid  nodules  - no evidence of recurrent/progression of patient NLPHL  FOLLOW-UP in 12 months for labs and follow-up with Dr. Onesimo.  The total time spent in the appointment was 20 minutes* .  All of the patient's questions were answered and the patient knows to call the clinic with any problems, questions, or concerns.  Emaline Onesimo MD MS AAHIVMS [facility] Hematology/Oncology Physician [facility]  *Total Encounter Time as defined by the Centers for Medicare and Medicaid Services includes, in addition to the face-to-face time of a patient visit (documented in the note above) non-face-to-face time: obtaining and reviewing outside history, ordering and reviewing medications, tests or procedures, care coordination (communications with other health care professionals or caregivers) and documentation in the medical record.  I,Emily Lagle,acting as a neurosurgeon for Emaline Onesimo, MD.,have documented all relevant documentation on the behalf of Emaline Onesimo, MD,as directed by  Emaline Onesimo, MD while in the presence of Emaline Onesimo, MD.  I have reviewed the above documentation for accuracy and completeness, and I agree with the above.  Keirah Konitzer, MD

## 2024-04-24 ENCOUNTER — Encounter: Payer: Self-pay | Admitting: Hematology

## 2024-04-27 ENCOUNTER — Other Ambulatory Visit (HOSPITAL_BASED_OUTPATIENT_CLINIC_OR_DEPARTMENT_OTHER): Payer: Self-pay

## 2024-04-27 ENCOUNTER — Other Ambulatory Visit: Payer: Self-pay

## 2024-04-27 MED ORDER — ESTRADIOL 0.5 MG PO TABS
0.5000 mg | ORAL_TABLET | Freq: Every day | ORAL | 0 refills | Status: DC
  Filled 2024-04-27 – 2024-06-09 (×2): qty 30, 30d supply, fill #0

## 2024-04-27 MED ORDER — ESTRADIOL 0.01 % VA CREA
1.0000 g | TOPICAL_CREAM | VAGINAL | 0 refills | Status: DC
  Filled 2024-04-27: qty 42.5, 31d supply, fill #0

## 2024-04-27 MED ORDER — PROGESTERONE 200 MG PO CAPS
200.0000 mg | ORAL_CAPSULE | Freq: Every day | ORAL | 0 refills | Status: DC
  Filled 2024-04-27: qty 30, 30d supply, fill #0

## 2024-04-27 MED ORDER — PROGESTERONE MICRONIZED 100 MG PO CAPS
100.0000 mg | ORAL_CAPSULE | Freq: Every day | ORAL | 0 refills | Status: AC
  Filled 2024-04-27: qty 30, 30d supply, fill #0
  Filled 2024-05-24: qty 30, 30d supply, fill #1
  Filled 2024-06-26: qty 30, 30d supply, fill #2

## 2024-05-07 ENCOUNTER — Other Ambulatory Visit: Payer: Self-pay | Admitting: Family

## 2024-05-07 ENCOUNTER — Other Ambulatory Visit: Payer: Self-pay

## 2024-05-07 ENCOUNTER — Other Ambulatory Visit (HOSPITAL_BASED_OUTPATIENT_CLINIC_OR_DEPARTMENT_OTHER): Payer: Self-pay

## 2024-05-07 DIAGNOSIS — F32A Depression, unspecified: Secondary | ICD-10-CM

## 2024-05-07 MED ORDER — PROPRANOLOL HCL 10 MG PO TABS
10.0000 mg | ORAL_TABLET | Freq: Two times a day (BID) | ORAL | 0 refills | Status: DC | PRN
  Filled 2024-05-07: qty 30, 15d supply, fill #0

## 2024-05-19 ENCOUNTER — Other Ambulatory Visit (HOSPITAL_BASED_OUTPATIENT_CLINIC_OR_DEPARTMENT_OTHER): Payer: Self-pay

## 2024-05-20 ENCOUNTER — Ambulatory Visit
Admission: RE | Admit: 2024-05-20 | Discharge: 2024-05-20 | Disposition: A | Discharge status: Home / Self Care | Source: Ambulatory Visit | Attending: "Endocrinology | Admitting: "Endocrinology

## 2024-05-20 ENCOUNTER — Encounter: Payer: Self-pay | Admitting: Hematology

## 2024-05-20 ENCOUNTER — Other Ambulatory Visit (HOSPITAL_COMMUNITY)
Admission: RE | Admit: 2024-05-20 | Discharge: 2024-05-20 | Disposition: A | Discharge status: Home / Self Care | Source: Ambulatory Visit | Attending: "Endocrinology | Admitting: "Endocrinology

## 2024-05-20 DIAGNOSIS — E041 Nontoxic single thyroid nodule: Secondary | ICD-10-CM | POA: Diagnosis not present

## 2024-05-20 DIAGNOSIS — E042 Nontoxic multinodular goiter: Secondary | ICD-10-CM

## 2024-05-20 DIAGNOSIS — Z8571 Personal history of Hodgkin lymphoma: Secondary | ICD-10-CM

## 2024-05-24 ENCOUNTER — Other Ambulatory Visit: Payer: Self-pay | Admitting: Family

## 2024-05-24 DIAGNOSIS — F32A Depression, unspecified: Secondary | ICD-10-CM

## 2024-05-25 ENCOUNTER — Other Ambulatory Visit: Payer: Self-pay

## 2024-05-25 ENCOUNTER — Encounter: Payer: Self-pay | Admitting: Family

## 2024-05-25 ENCOUNTER — Other Ambulatory Visit (HOSPITAL_BASED_OUTPATIENT_CLINIC_OR_DEPARTMENT_OTHER): Payer: Self-pay

## 2024-05-25 DIAGNOSIS — E119 Type 2 diabetes mellitus without complications: Secondary | ICD-10-CM

## 2024-05-25 LAB — CYTOLOGY - NON PAP

## 2024-05-25 MED ORDER — TIRZEPATIDE 10 MG/0.5ML ~~LOC~~ SOAJ
10.0000 mg | SUBCUTANEOUS | 0 refills | Status: DC
  Filled 2024-05-25 – 2024-06-05 (×2): qty 2, 28d supply, fill #0

## 2024-05-25 MED ORDER — PROPRANOLOL HCL 10 MG PO TABS
10.0000 mg | ORAL_TABLET | Freq: Two times a day (BID) | ORAL | 0 refills | Status: DC | PRN
  Filled 2024-05-25: qty 30, 15d supply, fill #0

## 2024-05-25 NOTE — Telephone Encounter (Signed)
 ok to send in 10mg  with no refills, please be sure she has an appt scheduled in another month. Thx

## 2024-05-25 NOTE — Telephone Encounter (Signed)
 Please advise on sending in higher dose per patient request. Please and thank you.

## 2024-05-26 ENCOUNTER — Encounter: Payer: Self-pay | Admitting: "Endocrinology

## 2024-05-27 ENCOUNTER — Encounter: Payer: Self-pay | Admitting: "Endocrinology

## 2024-05-27 ENCOUNTER — Ambulatory Visit (INDEPENDENT_AMBULATORY_CARE_PROVIDER_SITE_OTHER): Admitting: "Endocrinology

## 2024-05-27 ENCOUNTER — Other Ambulatory Visit

## 2024-05-27 VITALS — BP 110/80 | HR 85 | Ht 63.0 in | Wt 226.0 lb

## 2024-05-27 DIAGNOSIS — E042 Nontoxic multinodular goiter: Secondary | ICD-10-CM | POA: Diagnosis not present

## 2024-05-27 DIAGNOSIS — Z8571 Personal history of Hodgkin lymphoma: Secondary | ICD-10-CM

## 2024-05-27 LAB — TSH+FREE T4: TSH W/REFLEX TO FT4: 0.56 m[IU]/L

## 2024-05-27 NOTE — Progress Notes (Signed)
 Outpatient Endocrinology Note [name], MD    Bonnie Larsen [birth year] [id number]  Referring Provider: Lucius Krabbe, NP Primary Care Provider: Lucius Krabbe, NP Reason for consultation: Subjective   Assessment & Plan  Diagnoses and all orders for this visit:  Multinodular goiter -     TSH + free T4 -     US  THYROID ; Future -     TSH + free T4 -     US  THYROID   History of Hodgkin's lymphoma -     TSH + free T4 -     TSH + free T4    History of Hodgkin lymphoma lymphoma in the past Patient complains about waking up feeling show when waking up/talking, with voice getting also in the middle of talking Patient had sleep apnea ruled out with 06/2023 thyroid  ultrasound reported Right; superior 2.3 cm x 1.4 x 1.4 cm-TR 2 which did not meet criteria for FNA.  Other nodules smaller and did not meet criteria for FNA, recommend 1 year follow-up with repeat thyroid  ultrasound.  However given patient's history of Hodgkin's lymphoma as well as current complaints, recommend FNA on 2.3 cm x 1.4 x 1.4 cm nodule In the past, our radiologist did not proceed with FNA if they did not self recommended in the report, hence I placed an order for FNA explaining the need for FNA as well as recommended patient to have it ordered by another provider in another system in case our system does not do it Will speak to radiologist to comment on the CT done in 06/2023 to comment on the tracheal compression percentage to see if thyroidectomy will help patient with her symptoms, although the size of thyroid  ultrasound as well as negative Pemberton sign would be an indication against thyroidectomy providing any benefit 05/20/24: THYROID , RT SUPERIOR 2.3 cm x 1.4 x 1.4 cm  FNA- Benign follicular nodule (Bethesda category II)  Contacted radiologist to comment of tracheal compression on 06/2023 CT chest: addendum not made   Ordered f/u thyroid  U/S, will follow with CT neck WO if thyromegaly noted to  r/o tracheal compression as pt is interested in thyroidectomy if it could relieve her choking spells   Return in about 4 weeks (around 06/24/2024) for visit, labs today.   I have reviewed current medications, nurse's notes, allergies, vital signs, past medical and surgical history, family medical history, and social history for this encounter. Counseled patient on symptoms, examination findings, lab findings, imaging results, treatment decisions and monitoring and prognosis. The patient understood the recommendations and agrees with the treatment plan. All questions regarding treatment plan were fully answered.  [name], MD  05/27/24   History of Present Illness HPI  Bonnie Larsen is a 43 y.o. year old female who presents for follow up on her thyroid /multinodular goiter.  +fatigue, no constipation/hyperdefecation + heat intolerance, no cold intolerance + palpitation + anxiety, on medication   C/o getting choked up while waking up/taking, otherwise okay with swallowing  Voice gets hoarse in the middle of talking  Recently had sleep apnea-was normal No positional dyspnea   C/o tiredness and sleepiness, had history of non-hodgkin's lymphoma  She was tested for sleep apnea.  Thyroid  nodule seen on recent CT   06/2023 THYROID  ULTRASOUND   TECHNIQUE: Ultrasound examination of the thyroid  gland and adjacent soft tissues was performed.   COMPARISON:  CT chest abdomen pelvis 06/27/2023   FINDINGS: Parenchymal Echotexture: Mildly heterogenous   Isthmus: 0.3 cm   Right  lobe: 5.3 x 1.8 x 2.0 cm   Left lobe: 5.1 x 1.4 x 1.6 cm   _________________________________________________________   Estimated total number of nodules >/= 1 cm: 2   Number of spongiform nodules >/=  2 cm not described below (TR1): 0   Number of mixed cystic and solid nodules >/= 1.5 cm not described below (TR2): 0   _________________________________________________________   Nodule # 1:    Location: Right; superior   Maximum size: 2.3 cm; Other 2 dimensions: 1.4 x 1.4 cm   Composition: mixed cystic and solid (1)   Echogenicity: isoechoic (1)   Shape: not taller-than-wide (0)   Margins: ill-defined (0)   Echogenic foci: none (0)   ACR TI-RADS total points: 2.   ACR TI-RADS risk category: TR2 (2 points).   ACR TI-RADS recommendations:   This nodule does NOT meet TI-RADS criteria for biopsy or dedicated follow-up.   This nodule corresponds to the abnormality seen on recent CT.   _________________________________________________________   Nodule # 2:   Location: Right; inferior   Maximum size: 1.0 cm; Other 2 dimensions: 0.8 x 0.9 cm   Composition: solid/almost completely solid (2)   Echogenicity: hypoechoic (2)   Shape: not taller-than-wide (0)   Margins: smooth (0)   Echogenic foci: none (0)   ACR TI-RADS total points: 4.   ACR TI-RADS risk category: TR4 (4-6 points).   ACR TI-RADS recommendations:   *Given size (>/= 1 - 1.4 cm) and appearance, a follow-up ultrasound in 1 year should be considered based on TI-RADS criteria.   _________________________________________________________   0.5 cm solid hypoechoic left mid thyroid  nodule does not meet criteria for imaging surveillance or FNA.   IMPRESSION: Nodule 2 (TI-RADS 4), measuring 1.0 cm, located in the inferior right thyroid  lobe meets criteria for imaging follow-up. Annual ultrasound surveillance is recommended until 5 years of stability is documented.  Physical Exam  BP 110/80   Pulse 85   Ht 5' 3 (1.6 m)   Wt 226 lb (102.5 kg)   SpO2 98%   BMI 40.03 kg/m    Constitutional: well developed, well nourished Head: normocephalic, atraumatic Eyes: sclera anicteric, no redness Neck: supple, no thyromegaly noted  Lungs: normal respiratory effort Neurology: alert and oriented Skin: dry, no appreciable rashes Musculoskeletal: no appreciable defects Psychiatric: normal mood and  affect   Current Medications Patient's Medications  New Prescriptions   No medications on file  Previous Medications   BACLOFEN  (LIORESAL ) 10 MG TABLET    Take 1-2 tablets (10-20 mg total) by mouth at bedtime as needed for muscle spasms.   CHOLECALCIFEROL (VITAMIN D3) 1.25 MG (50000 UT) CAPS    Take 1 capsule by mouth once a week   ESTRADIOL  (ESTRACE ) 0.01 % CREA VAGINAL CREAM    Place 1 g vaginally 2 (two) times a week.   ESTRADIOL  (ESTRACE ) 0.5 MG TABLET    Take 1 tablet (0.5 mg total) by mouth daily.   ESTRADIOL  (ESTRACE ) 0.5 MG TABLET    Take 1 tablet (0.5 mg total) by mouth daily.   FLUTICASONE  (FLONASE ) 50 MCG/ACT NASAL SPRAY    Place 2 sprays into both nostrils daily.   HYDROCHLOROTHIAZIDE  (HYDRODIURIL ) 12.5 MG TABLET    Take 1 tablet (12.5 mg total) by mouth daily as needed.   LEVOCETIRIZINE (XYZAL  ALLERGY 24HR) 5 MG TABLET    Take 1 tablet (5 mg total) by mouth every evening.   LEVONORGESTREL  (MIRENA ) 20 MCG/24HR IUD    1 each by Intrauterine route  once.   MAGNESIUM PO    Take by mouth.   METFORMIN  (GLUCOPHAGE -XR) 500 MG 24 HR TABLET    Take 1 tablet (500 mg total) by mouth every evening.   MULTIPLE VITAMIN (MULTIVITAMIN) TABLET    Take 1 tablet by mouth daily.   NAPROXEN  (NAPROSYN ) 500 MG TABLET    Take 1 tablet (500 mg total) by mouth 2 (two) times daily with a meal. Take for 5 days then as needed.   ONDANSETRON  (ZOFRAN ) 4 MG TABLET    TAKE 1 TABLET BY MOUTH EVERY 8 HOURS AS NEEDED FOR NAUSEA OR VOMITING   PANTOPRAZOLE  (PROTONIX ) 20 MG TABLET    Take 1 tablet (20 mg total) by mouth daily. Take twice a day for a week to see if helps cough, then reduce to daily.   POTASSIUM PO    Take by mouth.   PROGESTERONE  (PROMETRIUM ) 100 MG CAPSULE    Take 1 capsule (100 mg total) by mouth daily.   PROGESTERONE  (PROMETRIUM ) 200 MG CAPSULE    Take 1 capsule (200 mg total) by mouth at bedtime.   PROGESTERONE  (PROMETRIUM ) 200 MG CAPSULE    Take 1 capsule (200 mg total) by mouth at bedtime.    PROPRANOLOL  (INDERAL ) 10 MG TABLET    Take 1 tablet (10 mg total) by mouth 2 (two) times daily as needed (For anxiety).   TIRZEPATIDE  (MOUNJARO ) 10 MG/0.5ML PEN    Inject 10 mg into the skin once a week.   VORTIOXETINE  HBR (TRINTELLIX ) 5 MG TABS TABLET    Take 1 tablet (5 mg total) by mouth daily after breakfast.  Modified Medications   No medications on file  Discontinued Medications   No medications on file    Allergies Allergies  Allergen Reactions   Clindamycin /Lincomycin Swelling    FACIAL SWELLING   Morphine     Cymbalta  [Duloxetine  Hcl] Nausea Only    Causes pt to have nausea and stomach cramping.    Past Medical History Past Medical History:  Diagnosis Date   Adenopathy 01/10/2017   Upper abdominal. Per CT   Anemia    Anxiety    Bacterial pharyngitis 07/30/2016   Counseling regarding advanced care planning and goals of care 06/24/2017   Depression    Difficult intravenous access    Family history of adverse reaction to anesthesia    pt's mother has hx. of post-op N/V and hard to wake up post-op   Foot swelling 07/05/2021   History of panic attacks    02/08/17- has not had a panic attacks   Lymphoma (HCC) 01/2017   Mesenteric lymphadenopathy 01/2017   Morbid obesity with BMI of 40.0-44.9, adult (HCC)    Need for immunization against influenza 05/31/2021   Obesity, Class II, BMI 35-39.9, isolated 04/25/2015   Right upper quadrant abdominal pain 05/31/2021   Runny nose 02/19/2017   clear drainage, per pt.   Seasonal allergies     Past Surgical History Past Surgical History:  Procedure Laterality Date   LYMPH NODE BIOPSY N/A 02/11/2017   Procedure: HAND ASSISTED LAPAROSCOPIC LYMPH NODE BIOPSY;  Surgeon: Aron Shoulders, MD;  Location: [facility] OR;  Service: General;  Laterality: N/A;   PORT-A-CATH REMOVAL N/A 09/04/2017   Procedure: REMOVAL PORT-A-CATH;  Surgeon: Aron Shoulders, MD;  Location: [facility] OR;  Service: General;  Laterality: N/A;   PORTACATH PLACEMENT N/A 02/20/2017    Procedure: INSERTION PORT-A-CATH;  Surgeon: Aron Shoulders, MD;  Location: [facility];  Service: General;  Laterality: N/A;  TUBAL LIGATION     UMBILICAL HERNIA REPAIR  02/11/2017    Family History family history includes Alcohol abuse in her father; Anesthesia problems in her mother; Arthritis in her mother; Breast cancer (age of onset: 40) in her mother; COPD in her father; Cancer in her mother and paternal grandfather; Cirrhosis in her father; Congestive Heart Failure in her father; Diabetes in her father and maternal grandmother; Early death in her father; Fibromyalgia in her mother; Heart disease in her maternal grandmother; Neuropathy in her mother; Other in her brother; Rheum arthritis in her mother.  Social History Social History   Socioeconomic History   Marital status: Married    Spouse name: Elsie   Number of children: 2   Years of education: Not on file   Highest education level: Associate degree: occupational, scientist, product/process development, or vocational program  Occupational History   Not on file  Tobacco Use   Smoking status: Never   Smokeless tobacco: Never  Vaping Use   Vaping status: Never Used  Substance and Sexual Activity   Alcohol use: No    Alcohol/week: 0.0 standard drinks of alcohol   Drug use: No   Sexual activity: Yes    Partners: Male    Birth control/protection: I.U.D.    Comment: tubal ligation  Other Topics Concern   Not on file  Social History Narrative   Not on file   Social Drivers of Health   Financial Resource Strain: Low Risk  (09/03/2017)   Overall Financial Resource Strain (CARDIA)    Difficulty of Paying Living Expenses: Not hard at all  Food Insecurity: No Food Insecurity (09/03/2017)   Hunger Vital Sign    Worried About Running Out of Food in the Last Year: Never true    Ran Out of Food in the Last Year: Never true  Transportation Needs: No Transportation Needs (09/03/2017)   PRAPARE - Administrator, Civil Service  (Medical): No    Lack of Transportation (Non-Medical): No  Physical Activity: Inactive (09/03/2017)   Exercise Vital Sign    Days of Exercise per Week: 0 days    Minutes of Exercise per Session: 0 min  Stress: Stress Concern Present (09/03/2017)   [employer] of Occupational Health - Occupational Stress Questionnaire    Feeling of Stress : Rather much  Social Connections: Somewhat Isolated (09/03/2017)   Social Connection and Isolation Panel    Frequency of Communication with Friends and Family: More than three times a week    Frequency of Social Gatherings with Friends and Family: Never    Attends Religious Services: Never    Database Administrator or Organizations: No    Attends Banker Meetings: Never    Marital Status: Married  Catering Manager Violence: Not At Risk (09/03/2017)   Humiliation, Afraid, Rape, and Kick questionnaire    Fear of Current or Ex-Partner: No    Emotionally Abused: No    Physically Abused: No    Sexually Abused: No    Lab Results  Component Value Date   CHOL 188 08/15/2022   Lab Results  Component Value Date   HDL 49.80 08/15/2022   Lab Results  Component Value Date   LDLCALC 111 (H) 08/15/2022   Lab Results  Component Value Date   TRIG 139.0 08/15/2022   Lab Results  Component Value Date   CHOLHDL 4 08/15/2022   Lab Results  Component Value Date   CREATININE 1.04 (H) 04/17/2024   Lab Results  Component  Value Date   GFR 66.40 04/08/2024      Component Value Date/Time   NA 139 04/17/2024 1421   NA 141 06/19/2017 0912   K 4.4 04/17/2024 1421   K 3.9 06/19/2017 0912   CL 105 04/17/2024 1421   CO2 28 04/17/2024 1421   CO2 25 06/19/2017 0912   GLUCOSE 87 04/17/2024 1421   GLUCOSE 90 06/19/2017 0912   BUN 15 04/17/2024 1421   BUN 10.7 06/19/2017 0912   CREATININE 1.04 (H) 04/17/2024 1421   CREATININE 0.9 06/19/2017 0912   CALCIUM 9.5 04/17/2024 1421   CALCIUM 9.5 06/19/2017 0912   PROT 7.1 04/17/2024 1421    PROT 6.9 06/19/2017 0912   ALBUMIN 4.3 04/17/2024 1421   ALBUMIN 3.8 06/19/2017 0912   AST 14 (L) 04/17/2024 1421   AST 17 06/19/2017 0912   ALT 9 04/17/2024 1421   ALT 15 06/19/2017 0912   ALKPHOS 58 04/17/2024 1421   ALKPHOS 35 (L) 06/19/2017 0912   BILITOT 0.3 04/17/2024 1421   BILITOT 0.32 06/19/2017 0912   GFRNONAA >60 04/17/2024 1421   GFRAA >60 12/24/2019 1230      Latest Ref Rng & Units 04/17/2024    2:21 PM 04/08/2024    8:49 AM 04/25/2023    1:50 PM  BMP  Glucose 70 - 99 mg/dL 87  95  89   BUN 6 - 20 mg/dL 15  10  9    Creatinine 0.44 - 1.00 mg/dL 8.95  8.97  8.94   Sodium 135 - 145 mmol/L 139  139  139   Potassium 3.5 - 5.1 mmol/L 4.4  4.8  4.4   Chloride 98 - 111 mmol/L 105  102  101   CO2 22 - 32 mmol/L 28  30  29    Calcium 8.9 - 10.3 mg/dL 9.5  9.4  9.9        Component Value Date/Time   WBC 5.5 04/17/2024 1421   WBC 5.6 01/23/2022 1413   RBC 5.27 (H) 04/17/2024 1421   HGB 13.8 04/17/2024 1421   HGB 12.3 06/19/2017 0912   HCT 42.0 04/17/2024 1421   HCT 37.6 06/19/2017 0912   PLT 240 04/17/2024 1421   PLT 257 06/19/2017 0912   MCV 79.7 (L) 04/17/2024 1421   MCV 83.6 06/19/2017 0912   MCH 26.2 04/17/2024 1421   MCHC 32.9 04/17/2024 1421   RDW 14.0 04/17/2024 1421   RDW 16.5 (H) 06/19/2017 0912   LYMPHSABS 1.4 04/17/2024 1421   LYMPHSABS 0.8 (L) 06/19/2017 0912   MONOABS 0.5 04/17/2024 1421   MONOABS 0.7 06/19/2017 0912   EOSABS 0.1 04/17/2024 1421   EOSABS 0.0 06/19/2017 0912   BASOSABS 0.0 04/17/2024 1421   BASOSABS 0.0 06/19/2017 0912   Lab Results  Component Value Date   TSH 1.06 09/23/2023   TSH 1.02 05/13/2023   TSH 1.14 08/15/2022   FREET4 1.4 09/23/2023         Parts of this note may have been dictated using voice recognition software. There may be variances in spelling and vocabulary which are unintentional. Not all errors are proofread. Please notify the dino if any discrepancies are noted or if the meaning of any statement is not  clear.

## 2024-05-29 ENCOUNTER — Encounter: Payer: Self-pay | Admitting: Hematology

## 2024-06-01 ENCOUNTER — Ambulatory Visit (HOSPITAL_COMMUNITY): Admission: RE | Admit: 2024-06-01 | Discharge: 2024-06-01 | Attending: "Endocrinology

## 2024-06-01 DIAGNOSIS — E042 Nontoxic multinodular goiter: Secondary | ICD-10-CM | POA: Diagnosis not present

## 2024-06-05 ENCOUNTER — Other Ambulatory Visit (HOSPITAL_BASED_OUTPATIENT_CLINIC_OR_DEPARTMENT_OTHER): Payer: Self-pay

## 2024-06-05 ENCOUNTER — Other Ambulatory Visit: Payer: Self-pay | Admitting: Family

## 2024-06-05 ENCOUNTER — Other Ambulatory Visit: Payer: Self-pay

## 2024-06-05 DIAGNOSIS — F419 Anxiety disorder, unspecified: Secondary | ICD-10-CM

## 2024-06-05 DIAGNOSIS — M25551 Pain in right hip: Secondary | ICD-10-CM

## 2024-06-05 MED ORDER — PROPRANOLOL HCL 10 MG PO TABS
10.0000 mg | ORAL_TABLET | Freq: Two times a day (BID) | ORAL | 0 refills | Status: DC | PRN
  Filled 2024-06-05: qty 30, 15d supply, fill #0

## 2024-06-05 MED ORDER — NAPROXEN 500 MG PO TABS
500.0000 mg | ORAL_TABLET | Freq: Two times a day (BID) | ORAL | 0 refills | Status: DC
  Filled 2024-06-05: qty 30, 15d supply, fill #0

## 2024-06-09 ENCOUNTER — Other Ambulatory Visit (HOSPITAL_BASED_OUTPATIENT_CLINIC_OR_DEPARTMENT_OTHER): Payer: Self-pay

## 2024-06-23 ENCOUNTER — Other Ambulatory Visit (HOSPITAL_BASED_OUTPATIENT_CLINIC_OR_DEPARTMENT_OTHER): Payer: Self-pay

## 2024-06-23 MED ORDER — ESTRADIOL 0.5 MG PO TABS
0.5000 mg | ORAL_TABLET | Freq: Every day | ORAL | 1 refills | Status: DC
  Filled 2024-06-23: qty 30, 30d supply, fill #0

## 2024-06-23 MED ORDER — PROGESTERONE MICRONIZED 100 MG PO CAPS
100.0000 mg | ORAL_CAPSULE | Freq: Every day | ORAL | 1 refills | Status: AC
  Filled 2024-06-23: qty 90, 90d supply, fill #0
  Filled 2024-07-26: qty 30, 30d supply, fill #0

## 2024-06-23 MED ORDER — PROGESTERONE 200 MG PO CAPS
200.0000 mg | ORAL_CAPSULE | Freq: Every day | ORAL | 1 refills | Status: AC
  Filled 2024-06-23: qty 90, 90d supply, fill #0
  Filled 2024-07-11: qty 30, 30d supply, fill #0

## 2024-06-24 ENCOUNTER — Other Ambulatory Visit: Payer: Self-pay

## 2024-06-24 ENCOUNTER — Encounter: Payer: Self-pay | Admitting: "Endocrinology

## 2024-06-24 ENCOUNTER — Telehealth: Admitting: "Endocrinology

## 2024-06-24 ENCOUNTER — Encounter: Payer: Self-pay | Admitting: Family

## 2024-06-24 VITALS — Ht 63.0 in | Wt 218.0 lb

## 2024-06-24 DIAGNOSIS — Z8571 Personal history of Hodgkin lymphoma: Secondary | ICD-10-CM

## 2024-06-24 DIAGNOSIS — E042 Nontoxic multinodular goiter: Secondary | ICD-10-CM

## 2024-06-24 DIAGNOSIS — C8108 Nodular lymphocyte predominant Hodgkin lymphoma, lymph nodes of multiple sites: Secondary | ICD-10-CM

## 2024-06-24 NOTE — Progress Notes (Signed)
 "  The patient reports they are currently: [place]. I spent 7-8 minutes on the video with the patient on the date of service. I spent an additional 5 minutes on pre- and post-visit activities on the date of service.   The patient was physically located in [place]  or a state in which I am permitted to provide care. The patient and/or parent/guardian understood that s/he may incur co-pays and cost sharing, and agreed to the telemedicine visit. The visit was reasonable and appropriate under the circumstances given the patient's presentation at the time.  The patient and/or parent/guardian understands the potential risks and limitations of this mode of treatment (including, but not limited to, the absence of in-person examination) and has agreed to be treated using telemedicine. The patient's/patient's family's questions regarding telemedicine have been answered.   The patient and/or parent/guardian will contact their provider's office for worsening conditions, and seek emergency medical treatment and/or call 911 if the patient deems either necessary.     Outpatient Endocrinology Note Bonnie Birmingham, MD    Bonnie Larsen December 21, 1980 [id number]  Referring Provider: Lucius Krabbe, NP Primary Care Provider: Lucius Krabbe, NP Reason for consultation: Subjective   Assessment & Plan  Diagnoses and all orders for this visit:  Multinodular goiter -     TSH + free T4  History of Hodgkin's lymphoma    History of Hodgkin lymphoma lymphoma in the past Patient complains about waking up feeling choked when waking up/talking, with voice getting also in the middle of talking Patient had sleep apnea ruled out  06/2023 thyroid  ultrasound reported Right; superior 2.3 cm x 1.4 x 1.4 cm-TR 2 which did not meet criteria for FNA.  Other nodules smaller and did not meet criteria for FNA, recommend 1 year follow-up with repeat thyroid  ultrasound.  However given patient's history of Hodgkin's lymphoma  as well as current complaints, recommend FNA on 2.3 cm x 1.4 x 1.4 cm nodule In the past, our radiologist did not proceed with FNA if they did not self recommended in the report, hence I placed an order for FNA explaining the need for FNA as well as recommended patient to have it ordered by another provider in another system in case our system does not do it Will speak to radiologist to comment on the CT done in 06/2023 to comment on the tracheal compression percentage to see if thyroidectomy will help patient with her symptoms, although the size of thyroid  ultrasound as well as negative Pemberton sign would be an indication against thyroidectomy providing any benefit 05/20/24: THYROID , RT SUPERIOR 2.3 cm x 1.4 x 1.4 cm  FNA- Benign follicular nodule (Bethesda category II)  Contacted radiologist to comment of tracheal compression on 06/2023 CT chest: addendum not made   Ordered f/u thyroid  U/S, will follow with CT neck WO if thyromegaly noted to r/o tracheal compression as pt is interested in thyroidectomy if it could relieve her choking spells  05/2024 thyroid  U/S reported multinodular goiter. No interval change in the size or appearance of the recently biopsied nodule # 1 in the right upper gland. Nodule # 2 (1.0 cm TI-RADS category 4) in the right mid gland demonstrates no interval change compared to prior imaging from January of 2025. This exam marks 1 year of stability. Nodule continues to meet criteria for imaging surveillance. Recommend follow-up ultrasound in 1 year. Given Pemberton's sign -ve and limited thyromegaly, thyroid  doesn't appear to be the etiology of choking episode: I have messaged the radiologist to review  CT chest from 06/2023 to see of thyroid  is causing any tracheal compression Recommend fu with ENT F/u in 1 year with me for f/u lab and thyroid  U/S  Return in about 1 year (around 06/24/2025) for visit + labs before next visit.   I have reviewed current medications, nurse's  notes, allergies, vital signs, past medical and surgical history, family medical history, and social history for this encounter. Counseled patient on symptoms, examination findings, lab findings, imaging results, treatment decisions and monitoring and prognosis. The patient understood the recommendations and agrees with the treatment plan. All questions regarding treatment plan were fully answered.  Bonnie Birmingham, MD  06/24/2024   History of Present Illness HPI  Bonnie Larsen is a 43 y.o. year old female who presents for follow up on her thyroid /multinodular goiter.  Denies any new complaints; continues to have previous complaints  + fatigue, no constipation/hyperdefecation + heat intolerance, no cold intolerance + palpitation + anxiety, on medication   C/o getting choked up while waking up/talking, otherwise okay with swallowing  Voice gets hoarse in the middle of talking  Recently had sleep apnea-was normal No positional dyspnea   C/o tiredness and sleepiness, had history of non-hodgkin's lymphoma  She was tested for sleep apnea.  Thyroid  nodule seen on recent CT   06/2023 THYROID  ULTRASOUND   TECHNIQUE: Ultrasound examination of the thyroid  gland and adjacent soft tissues was performed.   COMPARISON:  CT chest abdomen pelvis 06/27/2023   FINDINGS: Parenchymal Echotexture: Mildly heterogenous   Isthmus: 0.3 cm   Right lobe: 5.3 x 1.8 x 2.0 cm   Left lobe: 5.1 x 1.4 x 1.6 cm   _________________________________________________________   Estimated total number of nodules >/= 1 cm: 2   Number of spongiform nodules >/=  2 cm not described below (TR1): 0   Number of mixed cystic and solid nodules >/= 1.5 cm not described below (TR2): 0   _________________________________________________________   Nodule # 1:   Location: Right; superior   Maximum size: 2.3 cm; Other 2 dimensions: 1.4 x 1.4 cm   Composition: mixed cystic and solid (1)   Echogenicity: isoechoic  (1)   Shape: not taller-than-wide (0)   Margins: ill-defined (0)   Echogenic foci: none (0)   ACR TI-RADS total points: 2.   ACR TI-RADS risk category: TR2 (2 points).   ACR TI-RADS recommendations:   This nodule does NOT meet TI-RADS criteria for biopsy or dedicated follow-up.   This nodule corresponds to the abnormality seen on recent CT.   _________________________________________________________   Nodule # 2:   Location: Right; inferior   Maximum size: 1.0 cm; Other 2 dimensions: 0.8 x 0.9 cm   Composition: solid/almost completely solid (2)   Echogenicity: hypoechoic (2)   Shape: not taller-than-wide (0)   Margins: smooth (0)   Echogenic foci: none (0)   ACR TI-RADS total points: 4.   ACR TI-RADS risk category: TR4 (4-6 points).   ACR TI-RADS recommendations:   *Given size (>/= 1 - 1.4 cm) and appearance, a follow-up ultrasound in 1 year should be considered based on TI-RADS criteria.   _________________________________________________________   0.5 cm solid hypoechoic left mid thyroid  nodule does not meet criteria for imaging surveillance or FNA.   IMPRESSION: Nodule 2 (TI-RADS 4), measuring 1.0 cm, located in the inferior right thyroid  lobe meets criteria for imaging follow-up. Annual ultrasound surveillance is recommended until 5 years of stability is documented.  Physical Exam  Ht 5' 3 (1.6 m)  Wt 218 lb (98.9 kg)   BMI 38.62 kg/m    Constitutional: well developed, well nourished Head: normocephalic, atraumatic Eyes: sclera anicteric, no redness Neck: supple, no thyromegaly noted/nodule appreciated on inspection  Lungs: normal respiratory effort Neurology: alert and oriented Skin: dry, no appreciable rashes Musculoskeletal: no appreciable defects Psychiatric: normal mood and affect   Current Medications Patient's Medications  New Prescriptions   No medications on file  Previous Medications   BACLOFEN  (LIORESAL ) 10 MG TABLET     Take 1-2 tablets (10-20 mg total) by mouth at bedtime as needed for muscle spasms.   CHOLECALCIFEROL (VITAMIN D3) 1.25 MG (50000 UT) CAPS    Take 1 capsule by mouth once a week   ESTRADIOL  (ESTRACE ) 0.5 MG TABLET    Take 1 tablet (0.5 mg total) by mouth daily.   ESTRADIOL  (ESTRACE ) 0.5 MG TABLET    Take 1 tablet (0.5 mg total) by mouth daily.   ESTRADIOL  (ESTRACE ) 0.5 MG TABLET    Take 1 tablet (0.5 mg total) by mouth daily.   FLUTICASONE  (FLONASE ) 50 MCG/ACT NASAL SPRAY    Place 2 sprays into both nostrils daily.   HYDROCHLOROTHIAZIDE  (HYDRODIURIL ) 12.5 MG TABLET    Take 1 tablet (12.5 mg total) by mouth daily as needed.   LEVOCETIRIZINE (XYZAL  ALLERGY 24HR) 5 MG TABLET    Take 1 tablet (5 mg total) by mouth every evening.   LEVONORGESTREL  (MIRENA ) 20 MCG/24HR IUD    1 each by Intrauterine route once.   MAGNESIUM PO    Take by mouth.   METFORMIN  (GLUCOPHAGE -XR) 500 MG 24 HR TABLET    Take 1 tablet (500 mg total) by mouth every evening.   MULTIPLE VITAMIN (MULTIVITAMIN) TABLET    Take 1 tablet by mouth daily.   NAPROXEN  (NAPROSYN ) 500 MG TABLET    Take 1 tablet (500 mg total) by mouth 2 (two) times daily with a meal. Take for 5 days then as needed.   ONDANSETRON  (ZOFRAN ) 4 MG TABLET    TAKE 1 TABLET BY MOUTH EVERY 8 HOURS AS NEEDED FOR NAUSEA OR VOMITING   PANTOPRAZOLE  (PROTONIX ) 20 MG TABLET    Take 1 tablet (20 mg total) by mouth daily. Take twice a day for a week to see if helps cough, then reduce to daily.   POTASSIUM PO    Take by mouth.   PROGESTERONE  (PROMETRIUM ) 100 MG CAPSULE    Take 1 capsule (100 mg total) by mouth daily.   PROGESTERONE  (PROMETRIUM ) 100 MG CAPSULE    Take 1 capsule (100 mg total) by mouth daily.   PROGESTERONE  (PROMETRIUM ) 200 MG CAPSULE    Take 1 capsule (200 mg total) by mouth at bedtime.   PROGESTERONE  (PROMETRIUM ) 200 MG CAPSULE    Take 1 capsule (200 mg total) by mouth at bedtime.   PROGESTERONE  (PROMETRIUM ) 200 MG CAPSULE    Take 1 capsule (200 mg total) by mouth  at bedtime.   PROPRANOLOL  (INDERAL ) 10 MG TABLET    Take 1 tablet (10 mg total) by mouth 2 (two) times daily as needed (For anxiety).   TIRZEPATIDE  (MOUNJARO ) 10 MG/0.5ML PEN    Inject 10 mg into the skin once a week.   VORTIOXETINE  HBR (TRINTELLIX ) 5 MG TABS TABLET    Take 1 tablet (5 mg total) by mouth daily after breakfast.  Modified Medications   No medications on file  Discontinued Medications   No medications on file    Allergies Allergies  Allergen Reactions   Clindamycin /Lincomycin  Swelling    FACIAL SWELLING   Morphine     Cymbalta  [Duloxetine  Hcl] Nausea Only    Causes pt to have nausea and stomach cramping.    Past Medical History Past Medical History:  Diagnosis Date   Adenopathy 01/10/2017   Upper abdominal. Per CT   Anemia    Anxiety    Bacterial pharyngitis 07/30/2016   Counseling regarding advanced care planning and goals of care 06/24/2017   Depression    Difficult intravenous access    Family history of adverse reaction to anesthesia    pt's mother has hx. of post-op N/V and hard to wake up post-op   Foot swelling 07/05/2021   History of panic attacks    02/08/17- has not had a panic attacks   Lymphoma (HCC) 01/2017   Mesenteric lymphadenopathy 01/2017   Morbid obesity with BMI of 40.0-44.9, adult (HCC)    Need for immunization against influenza 05/31/2021   Obesity, Class II, BMI 35-39.9, isolated 04/25/2015   Right upper quadrant abdominal pain 05/31/2021   Runny nose 02/19/2017   clear drainage, per pt.   Seasonal allergies     Past Surgical History Past Surgical History:  Procedure Laterality Date   LYMPH NODE BIOPSY N/A 02/11/2017   Procedure: HAND ASSISTED LAPAROSCOPIC LYMPH NODE BIOPSY;  Surgeon: Aron Shoulders, MD;  Location: [facility] OR;  Service: General;  Laterality: N/A;   PORT-A-CATH REMOVAL N/A 09/04/2017   Procedure: REMOVAL PORT-A-CATH;  Surgeon: Aron Shoulders, MD;  Location: [facility] OR;  Service: General;  Laterality: N/A;   PORTACATH PLACEMENT  N/A 02/20/2017   Procedure: INSERTION PORT-A-CATH;  Surgeon: Aron Shoulders, MD;  Location: [facility];  Service: General;  Laterality: N/A;   TUBAL LIGATION     UMBILICAL HERNIA REPAIR  02/11/2017    Family History family history includes Alcohol abuse in her father; Anesthesia problems in her mother; Arthritis in her mother; Breast cancer (age of onset: 81) in her mother; COPD in her father; Cancer in her mother and paternal grandfather; Cirrhosis in her father; Congestive Heart Failure in her father; Diabetes in her father and maternal grandmother; Early death in her father; Fibromyalgia in her mother; Heart disease in her maternal grandmother; Neuropathy in her mother; Other in her brother; Rheum arthritis in her mother.  Social History Social History   Socioeconomic History   Marital status: Married    Spouse name: Elsie   Number of children: 2   Years of education: Not on file   Highest education level: Associate degree: occupational, scientist, product/process development, or vocational program  Occupational History   Not on file  Tobacco Use   Smoking status: Never   Smokeless tobacco: Never  Vaping Use   Vaping status: Never Used  Substance and Sexual Activity   Alcohol use: No    Alcohol/week: 0.0 standard drinks of alcohol   Drug use: No   Sexual activity: Yes    Partners: Male    Birth control/protection: I.U.D.    Comment: tubal ligation  Other Topics Concern   Not on file  Social History Narrative   Not on file   Social Drivers of Health   Tobacco Use: Low Risk (06/24/2024)   Patient History    Smoking Tobacco Use: Never    Smokeless Tobacco Use: Never    Passive Exposure: Not on file  Financial Resource Strain: Not on file  Food Insecurity: Not on file  Transportation Needs: Not on file  Physical Activity: Not on file  Stress: Not on  file  Social Connections: Not on file  Intimate Partner Violence: Not on file  Depression [phone]): Low Risk (04/17/2024)    Depression (PHQ2-9)    PHQ-2 Score: 2  Recent Concern: Depression (PHQ2-9) - Medium Risk (04/08/2024)   Depression (PHQ2-9)    PHQ-2 Score: 9  Alcohol Screen: Not on file  Housing: Not on file  Utilities: Not on file  Health Literacy: Not on file    Lab Results  Component Value Date   CHOL 188 08/15/2022   Lab Results  Component Value Date   HDL 49.80 08/15/2022   Lab Results  Component Value Date   LDLCALC 111 (H) 08/15/2022   Lab Results  Component Value Date   TRIG 139.0 08/15/2022   Lab Results  Component Value Date   CHOLHDL 4 08/15/2022   Lab Results  Component Value Date   CREATININE 1.04 (H) 04/17/2024   Lab Results  Component Value Date   GFR 66.40 04/08/2024      Component Value Date/Time   NA 139 04/17/2024 1421   NA 141 06/19/2017 0912   K 4.4 04/17/2024 1421   K 3.9 06/19/2017 0912   CL 105 04/17/2024 1421   CO2 28 04/17/2024 1421   CO2 25 06/19/2017 0912   GLUCOSE 87 04/17/2024 1421   GLUCOSE 90 06/19/2017 0912   BUN 15 04/17/2024 1421   BUN 10.7 06/19/2017 0912   CREATININE 1.04 (H) 04/17/2024 1421   CREATININE 0.9 06/19/2017 0912   CALCIUM 9.5 04/17/2024 1421   CALCIUM 9.5 06/19/2017 0912   PROT 7.1 04/17/2024 1421   PROT 6.9 06/19/2017 0912   ALBUMIN 4.3 04/17/2024 1421   ALBUMIN 3.8 06/19/2017 0912   AST 14 (L) 04/17/2024 1421   AST 17 06/19/2017 0912   ALT 9 04/17/2024 1421   ALT 15 06/19/2017 0912   ALKPHOS 58 04/17/2024 1421   ALKPHOS 35 (L) 06/19/2017 0912   BILITOT 0.3 04/17/2024 1421   BILITOT 0.32 06/19/2017 0912   GFRNONAA >60 04/17/2024 1421   GFRAA >60 12/24/2019 1230      Latest Ref Rng & Units 04/17/2024    2:21 PM 04/08/2024    8:49 AM 04/25/2023    1:50 PM  BMP  Glucose 70 - 99 mg/dL 87  95  89   BUN 6 - 20 mg/dL 15  10  9    Creatinine 0.44 - 1.00 mg/dL 8.95  8.97  8.94   Sodium 135 - 145 mmol/L 139  139  139   Potassium 3.5 - 5.1 mmol/L 4.4  4.8  4.4   Chloride 98 - 111 mmol/L 105  102  101   CO2 22 -  32 mmol/L 28  30  29    Calcium 8.9 - 10.3 mg/dL 9.5  9.4  9.9        Component Value Date/Time   WBC 5.5 04/17/2024 1421   WBC 5.6 01/23/2022 1413   RBC 5.27 (H) 04/17/2024 1421   HGB 13.8 04/17/2024 1421   HGB 12.3 06/19/2017 0912   HCT 42.0 04/17/2024 1421   HCT 37.6 06/19/2017 0912   PLT 240 04/17/2024 1421   PLT 257 06/19/2017 0912   MCV 79.7 (L) 04/17/2024 1421   MCV 83.6 06/19/2017 0912   MCH 26.2 04/17/2024 1421   MCHC 32.9 04/17/2024 1421   RDW 14.0 04/17/2024 1421   RDW 16.5 (H) 06/19/2017 0912   LYMPHSABS 1.4 04/17/2024 1421   LYMPHSABS 0.8 (L) 06/19/2017 0912   MONOABS 0.5 04/17/2024 1421  MONOABS 0.7 06/19/2017 0912   EOSABS 0.1 04/17/2024 1421   EOSABS 0.0 06/19/2017 0912   BASOSABS 0.0 04/17/2024 1421   BASOSABS 0.0 06/19/2017 0912   Lab Results  Component Value Date   TSH 1.06 09/23/2023   TSH 1.02 05/13/2023   TSH 1.14 08/15/2022   FREET4 1.4 09/23/2023         Parts of this note may have been dictated using voice recognition software. There may be variances in spelling and vocabulary which are unintentional. Not all errors are proofread. Please notify the dino if any discrepancies are noted or if the meaning of any statement is not clear.   "

## 2024-06-26 ENCOUNTER — Other Ambulatory Visit: Payer: Self-pay | Admitting: Family

## 2024-06-26 ENCOUNTER — Other Ambulatory Visit (HOSPITAL_BASED_OUTPATIENT_CLINIC_OR_DEPARTMENT_OTHER): Payer: Self-pay

## 2024-06-26 ENCOUNTER — Other Ambulatory Visit: Payer: Self-pay | Admitting: Family Medicine

## 2024-06-26 ENCOUNTER — Other Ambulatory Visit: Payer: Self-pay

## 2024-06-26 DIAGNOSIS — E119 Type 2 diabetes mellitus without complications: Secondary | ICD-10-CM

## 2024-06-26 DIAGNOSIS — M25551 Pain in right hip: Secondary | ICD-10-CM

## 2024-06-26 DIAGNOSIS — F32A Depression, unspecified: Secondary | ICD-10-CM

## 2024-06-26 MED ORDER — NAPROXEN 500 MG PO TABS
500.0000 mg | ORAL_TABLET | Freq: Two times a day (BID) | ORAL | 0 refills | Status: DC
  Filled 2024-06-26: qty 30, 15d supply, fill #0

## 2024-06-26 MED ORDER — MOUNJARO 10 MG/0.5ML ~~LOC~~ SOAJ
10.0000 mg | SUBCUTANEOUS | 0 refills | Status: DC
  Filled 2024-06-26: qty 2, 28d supply, fill #0

## 2024-06-26 MED ORDER — PROPRANOLOL HCL 10 MG PO TABS
10.0000 mg | ORAL_TABLET | Freq: Two times a day (BID) | ORAL | 0 refills | Status: DC | PRN
  Filled 2024-06-26: qty 30, 15d supply, fill #0

## 2024-06-26 MED ORDER — BACLOFEN 10 MG PO TABS
10.0000 mg | ORAL_TABLET | Freq: Every evening | ORAL | 0 refills | Status: AC | PRN
  Filled 2024-06-26: qty 60, 30d supply, fill #0

## 2024-06-26 NOTE — Telephone Encounter (Signed)
 Last OV 07/02/23 Next OV not scheduled  Last refill 05/13/23 Qty #60/12  Renal labs 04/17/24  Component Ref Range & Units (hover) 2 mo ago (04/17/24) 2 mo ago (04/08/24) 1 yr ago (04/25/23) 1 yr ago (03/18/23) 1 yr ago (07/27/22) 2 yr ago (05/30/22) 2 yr ago (01/23/22)  Sodium 139 139 R 139 R 139 139 142 R 138  Potassium 4.4 4.8 R 4.4 R 4.1 4.1 4.8 R 3.9  Chloride 105 102 R 101 R 103 102 101 R 100  CO2 28 30 R 29 R 27 31 31  R 30  Glucose, Bld 87 95 89 100 High  CM 79 CM 92 105 High  CM  Comment: Glucose reference range applies only to samples taken after fasting for at least 8 hours.  BUN 15 10 R 9 R 13 13 12  R 15  Creatinine 1.04 High  1.02 R 1.05 R 1.15 High  1.09 High  1.14 R 1.09 High   Calcium 9.5 9.4 R 9.9 R 9.6 10.0 10.0 R 9.8  Total Protein 7.1 7.2 R  7.3 6.8  7.5  Albumin 4.3 4.4 R  4.4 4.3  4.5  AST 14 Low  21 R  15 13 Low   16  ALT 9 15 R  12 8  11   Alkaline Phosphatase 58 53 R  57 42  44  Total Bilirubin 0.3 0.4 R  0.6 R 0.5 R  0.4 R  GFR, Estimated >60   >60 CM >60 CM  >60 CM  Comment: (NOTE) Calculated using the CKD-EPI Creatinine Equation (2021)  Anion gap 6   9 CM 6 CM  8 CM  Comment: Performed at [facility], [address], [place], [place] [address]

## 2024-06-27 ENCOUNTER — Emergency Department (HOSPITAL_BASED_OUTPATIENT_CLINIC_OR_DEPARTMENT_OTHER)
Admission: EM | Admit: 2024-06-27 | Discharge: 2024-06-28 | Disposition: A | Attending: Emergency Medicine | Admitting: Emergency Medicine

## 2024-06-27 ENCOUNTER — Other Ambulatory Visit: Payer: Self-pay

## 2024-06-27 DIAGNOSIS — Z79899 Other long term (current) drug therapy: Secondary | ICD-10-CM | POA: Diagnosis not present

## 2024-06-27 DIAGNOSIS — Z7984 Long term (current) use of oral hypoglycemic drugs: Secondary | ICD-10-CM | POA: Insufficient documentation

## 2024-06-27 DIAGNOSIS — R1011 Right upper quadrant pain: Secondary | ICD-10-CM | POA: Insufficient documentation

## 2024-06-27 DIAGNOSIS — R0789 Other chest pain: Secondary | ICD-10-CM | POA: Insufficient documentation

## 2024-06-27 DIAGNOSIS — R000 Tachycardia, unspecified: Secondary | ICD-10-CM | POA: Diagnosis not present

## 2024-06-27 DIAGNOSIS — R079 Chest pain, unspecified: Secondary | ICD-10-CM

## 2024-06-27 DIAGNOSIS — R1010 Upper abdominal pain, unspecified: Secondary | ICD-10-CM

## 2024-06-27 NOTE — ED Triage Notes (Signed)
 Pt to exam 11 via [facility] c/o chest pain 9/10 pressure sudden onset that woke her from sleep. PT denies N/V Fever chills SOB or alleviating factors. VSS NAD PT on room air. IV established Labs drawn EKG completed.

## 2024-06-28 ENCOUNTER — Emergency Department (HOSPITAL_BASED_OUTPATIENT_CLINIC_OR_DEPARTMENT_OTHER)

## 2024-06-28 LAB — COMPREHENSIVE METABOLIC PANEL WITH GFR
ALT: 55 U/L — ABNORMAL HIGH (ref 0–44)
AST: 121 U/L — ABNORMAL HIGH (ref 15–41)
Albumin: 4.5 g/dL (ref 3.5–5.0)
Alkaline Phosphatase: 68 U/L (ref 38–126)
Anion gap: 13 (ref 5–15)
BUN: 13 mg/dL (ref 6–20)
CO2: 27 mmol/L (ref 22–32)
Calcium: 10 mg/dL (ref 8.9–10.3)
Chloride: 103 mmol/L (ref 98–111)
Creatinine, Ser: 1.13 mg/dL — ABNORMAL HIGH (ref 0.44–1.00)
GFR, Estimated: >60 mL/min
Glucose, Bld: 105 mg/dL — ABNORMAL HIGH (ref 70–99)
Potassium: 3.5 mmol/L (ref 3.5–5.1)
Sodium: 142 mmol/L (ref 135–145)
Total Bilirubin: 0.5 mg/dL (ref 0.0–1.2)
Total Protein: 7.3 g/dL (ref 6.5–8.1)

## 2024-06-28 LAB — CBC WITH DIFFERENTIAL/PLATELET
Abs Immature Granulocytes: 0.01 K/uL (ref 0.00–0.07)
Basophils Absolute: 0 K/uL (ref 0.0–0.1)
Basophils Relative: 0 %
Eosinophils Absolute: 0.1 K/uL (ref 0.0–0.5)
Eosinophils Relative: 1 %
HCT: 38.6 % (ref 36.0–46.0)
Hemoglobin: 12.9 g/dL (ref 12.0–15.0)
Immature Granulocytes: 0 %
Lymphocytes Relative: 17 %
Lymphs Abs: 1 K/uL (ref 0.7–4.0)
MCH: 26.7 pg (ref 26.0–34.0)
MCHC: 33.4 g/dL (ref 30.0–36.0)
MCV: 79.9 fL — ABNORMAL LOW (ref 80.0–100.0)
Monocytes Absolute: 0.5 K/uL (ref 0.1–1.0)
Monocytes Relative: 9 %
Neutro Abs: 4.5 K/uL (ref 1.7–7.7)
Neutrophils Relative %: 73 %
Platelets: 182 K/uL (ref 150–400)
RBC: 4.83 MIL/uL (ref 3.87–5.11)
RDW: 13.5 % (ref 11.5–15.5)
WBC: 6.1 K/uL (ref 4.0–10.5)
nRBC: 0 % (ref 0.0–0.2)

## 2024-06-28 LAB — TROPONIN T, HIGH SENSITIVITY
Troponin T High Sensitivity: <15 ng/L (ref 0–19)
Troponin T High Sensitivity: <15 ng/L (ref 0–19)

## 2024-06-28 LAB — LIPASE, BLOOD: Lipase: 43 U/L (ref 11–51)

## 2024-06-28 LAB — D-DIMER, QUANTITATIVE: D-Dimer, Quant: 0.37 ug{FEU}/mL (ref 0.00–0.50)

## 2024-06-28 MED ORDER — FAMOTIDINE 20 MG PO TABS
20.0000 mg | ORAL_TABLET | Freq: Two times a day (BID) | ORAL | 0 refills | Status: AC
  Filled 2024-06-28: qty 30, 15d supply, fill #0

## 2024-06-28 MED ORDER — SUCRALFATE 1 G PO TABS
1.0000 g | ORAL_TABLET | Freq: Three times a day (TID) | ORAL | 0 refills | Status: AC
  Filled 2024-06-28: qty 40, 10d supply, fill #0

## 2024-06-28 MED ORDER — ONDANSETRON HCL 4 MG/2ML IJ SOLN
4.0000 mg | Freq: Once | INTRAMUSCULAR | Status: AC
  Administered 2024-06-28: 4 mg via INTRAVENOUS
  Filled 2024-06-28: qty 2

## 2024-06-28 MED ORDER — HYDROMORPHONE HCL 1 MG/ML IJ SOLN
1.0000 mg | Freq: Once | INTRAMUSCULAR | Status: AC
  Administered 2024-06-28: 1 mg via INTRAVENOUS
  Filled 2024-06-28: qty 1

## 2024-06-28 MED ORDER — SODIUM CHLORIDE 0.9 % IV BOLUS
1000.0000 mL | Freq: Once | INTRAVENOUS | Status: AC
  Administered 2024-06-28: 1000 mL via INTRAVENOUS

## 2024-06-28 NOTE — Discharge Instructions (Signed)
 I think all of your symptoms tonight were secondary to your GERD and likely inflammation of your stomach and esophagus.  Will have you increase your pantoprazole  dose, add some other medications for further protection and follow-up with your doctor for recheck.  Increase your pantoprazole  to 2 tablets a day for the next couple of days.  Add the new medications for the next week.

## 2024-06-28 NOTE — ED Provider Notes (Signed)
 " Bonnie Larsen EMERGENCY DEPARTMENT AT [facility] Provider Note   CSN: [id number] Arrival date & time: 06/27/24  2335     Patient presents with: Chest Pain   Bonnie Larsen is a 44 y.o. female.   Patient presents emergency department for evaluation of chest pain.  Patient reports that the pain woke her from sleep.  Initially she had a sharp pain in the center of her chest.  She reports that she has a history of reflux, got up and walked around to see if it would help.  Pain then started radiating to the back.  Patient reports that at this point, pain is now more in the right side of the chest and possibly upper abdomen.       Prior to Admission medications  Medication Sig Start Date End Date Taking? Authorizing Provider  famotidine  (PEPCID ) 20 MG tablet Take 1 tablet (20 mg total) by mouth 2 (two) times daily. 06/28/24  Yes Abel Ra, Lonni PARAS, MD  sucralfate  (CARAFATE ) 1 g tablet Take 1 tablet (1 g total) by mouth 4 (four) times daily -  with meals and at bedtime. 06/28/24  Yes Abner Ardis, Lonni PARAS, MD  baclofen  (LIORESAL ) 10 MG tablet Take 1-2 tablets (10-20 mg total) by mouth at bedtime as needed for muscle spasms. 06/26/24   Corey, Evan S, MD  Cholecalciferol (VITAMIN D3) 1.25 MG (50000 UT) CAPS Take 1 capsule by mouth once a week 01/16/24   Lucius Krabbe, NP  estradiol  (ESTRACE ) 0.5 MG tablet Take 1 tablet (0.5 mg total) by mouth daily. 03/16/24     estradiol  (ESTRACE ) 0.5 MG tablet Take 1 tablet (0.5 mg total) by mouth daily. 04/27/24     estradiol  (ESTRACE ) 0.5 MG tablet Take 1 tablet (0.5 mg total) by mouth daily. 06/23/24     fluticasone  (FLONASE ) 50 MCG/ACT nasal spray Place 2 sprays into both nostrils daily. 11/15/23   Soldatova, Liuba, MD  hydrochlorothiazide  (HYDRODIURIL ) 12.5 MG tablet Take 1 tablet (12.5 mg total) by mouth daily as needed. 08/16/22   Lucius Krabbe, NP  levocetirizine (XYZAL  ALLERGY 24HR) 5 MG tablet Take 1 tablet (5 mg total) by mouth every  evening. 11/15/23   Soldatova, Liuba, MD  levonorgestrel  (MIRENA ) 20 MCG/24HR IUD 1 each by Intrauterine route once.    [provider]  MAGNESIUM PO Take by mouth.    [provider]  metFORMIN  (GLUCOPHAGE -XR) 500 MG 24 hr tablet Take 1 tablet (500 mg total) by mouth every evening. 04/08/24   Lucius Krabbe, NP  Multiple Vitamin (MULTIVITAMIN) tablet Take 1 tablet by mouth daily.    [provider]  naproxen  (NAPROSYN ) 500 MG tablet Take 1 tablet (500 mg total) by mouth 2 (two) times daily with a meal. Take for 5 days then as needed. 06/26/24   Lucius Krabbe, NP  ondansetron  (ZOFRAN ) 4 MG tablet TAKE 1 TABLET BY MOUTH EVERY 8 HOURS AS NEEDED FOR NAUSEA OR VOMITING 05/07/23   Lucius Krabbe, NP  pantoprazole  (PROTONIX ) 20 MG tablet Take 1 tablet (20 mg total) by mouth daily. Take twice a day for a week to see if helps cough, then reduce to daily. 04/08/24   Lucius Krabbe, NP  POTASSIUM PO Take by mouth.    [provider]  progesterone  (PROMETRIUM ) 100 MG capsule Take 1 capsule (100 mg total) by mouth daily. 04/27/24     progesterone  (PROMETRIUM ) 100 MG capsule Take 1 capsule (100 mg total) by mouth daily. 06/23/24     progesterone  (PROMETRIUM ) 200  MG capsule Take 1 capsule (200 mg total) by mouth at bedtime. 03/16/24     progesterone  (PROMETRIUM ) 200 MG capsule Take 1 capsule (200 mg total) by mouth at bedtime. 04/27/24     progesterone  (PROMETRIUM ) 200 MG capsule Take 1 capsule (200 mg total) by mouth at bedtime. 06/23/24     propranolol  (INDERAL ) 10 MG tablet Take 1 tablet (10 mg total) by mouth 2 (two) times daily as needed (For anxiety). 06/26/24   Lucius Krabbe, NP  tirzepatide  (MOUNJARO ) 10 MG/0.5ML Pen Inject 10 mg into the skin once a week. 06/26/24   Lucius Krabbe, NP  vortioxetine  HBr (TRINTELLIX ) 5 MG TABS tablet Take 1 tablet (5 mg total) by mouth daily after breakfast. 02/07/24   Lucius Krabbe, NP    Allergies:  Clindamycin /lincomycin, Morphine , and Cymbalta  [duloxetine  hcl]    Review of Systems  Updated Vital Signs BP 120/79 (BP Location: Right Arm)   Pulse (!) 102   Temp 98.3 F (36.8 C) (Oral)   Resp 14   Ht 5' 3 (1.6 m)   Wt 98.9 kg   LMP  (LMP Unknown)   SpO2 99%   BMI 38.62 kg/m   Physical Exam Vitals and nursing note reviewed.  Constitutional:      General: She is not in acute distress.    Appearance: She is well-developed.  HENT:     Head: Normocephalic and atraumatic.     Mouth/Throat:     Mouth: Mucous membranes are moist.  Eyes:     General: Vision grossly intact. Gaze aligned appropriately.     Extraocular Movements: Extraocular movements intact.     Conjunctiva/sclera: Conjunctivae normal.  Cardiovascular:     Rate and Rhythm: Regular rhythm. Tachycardia present.     Pulses: Normal pulses.     Heart sounds: Normal heart sounds, S1 normal and S2 normal. No murmur heard.    No friction rub. No gallop.  Pulmonary:     Effort: Pulmonary effort is normal. No respiratory distress.     Breath sounds: Normal breath sounds.  Abdominal:     General: Bowel sounds are normal.     Palpations: Abdomen is soft.     Tenderness: There is abdominal tenderness in the right upper quadrant. There is no guarding or rebound.     Hernia: No hernia is present.  Musculoskeletal:        General: No swelling.     Cervical back: Full passive range of motion without pain, normal range of motion and neck supple. No spinous process tenderness or muscular tenderness. Normal range of motion.     Right lower leg: No edema.     Left lower leg: No edema.  Skin:    General: Skin is warm and dry.     Capillary Refill: Capillary refill takes less than 2 seconds.     Findings: No ecchymosis, erythema, rash or wound.  Neurological:     General: No focal deficit present.     Mental Status: She is alert and oriented to person, place, and time.     GCS: GCS eye subscore is 4. GCS verbal subscore is  5. GCS motor subscore is 6.     Cranial Nerves: Cranial nerves 2-12 are intact.     Sensory: Sensation is intact.     Motor: Motor function is intact.     Coordination: Coordination is intact.  Psychiatric:        Attention and Perception: Attention normal.  Mood and Affect: Mood normal.        Speech: Speech normal.        Behavior: Behavior normal.     (all labs ordered are listed, but only abnormal results are displayed) Labs Reviewed  COMPREHENSIVE METABOLIC PANEL WITH GFR - Abnormal; Notable for the following components:      Result Value   Glucose, Bld 105 (*)    Creatinine, Ser 1.13 (*)    AST 121 (*)    ALT 55 (*)    All other components within normal limits  CBC WITH DIFFERENTIAL/PLATELET - Abnormal; Notable for the following components:   MCV 79.9 (*)    All other components within normal limits  LIPASE, BLOOD  D-DIMER, QUANTITATIVE  CBC WITH DIFFERENTIAL/PLATELET  TROPONIN T, HIGH SENSITIVITY  TROPONIN T, HIGH SENSITIVITY    EKG: EKG Interpretation Date/Time:  Saturday June 27 2024 23:49:34 EST Ventricular Rate:  107 PR Interval:  157 QRS Duration:  78 QT Interval:  347 QTC Calculation: 463 R Axis:   73  Text Interpretation: Sinus tachycardia Otherwise within normal limits Confirmed by Haze Lonni PARAS ([id number]) on 06/27/2024 11:56:26 PM  Radiology: [facility] Chest Port 1 View Result Date: 06/28/2024 EXAM: 1 AP VIEW XRAY OF THE CHEST 06/28/2024 12:12:00 AM COMPARISON: Comparison dated 04/22/2017. CLINICAL HISTORY: chest pain FINDINGS: LUNGS AND PLEURA: No focal pulmonary opacity. No pleural effusion. No pneumothorax. HEART AND MEDIASTINUM: No acute abnormality of the cardiac and mediastinal silhouettes. BONES AND SOFT TISSUES: No acute osseous abnormality. IMPRESSION: 1. No active cardiopulmonary disease. Electronically signed by: Franky Crease MD 06/28/2024 12:30 AM EST RP Workstation: [id number]     Procedures   Medications Ordered in the ED   HYDROmorphone  (DILAUDID ) injection 1 mg (1 mg Intravenous Given 06/28/24 0011)  ondansetron  (ZOFRAN ) injection 4 mg (4 mg Intravenous Given 06/28/24 0011)  sodium chloride  0.9 % bolus 1,000 mL (0 mLs Intravenous Stopped 06/28/24 0056)                                    Medical Decision Making Amount and/or Complexity of Data Reviewed Labs: ordered. Decision-making details documented in ED Course. Radiology: ordered and independent interpretation performed. Decision-making details documented in ED Course. ECG/medicine tests: ordered and independent interpretation performed. Decision-making details documented in ED Course.  Risk Prescription drug management.   Differential Diagnosis considered includes, but not limited to: STEMI; NSTEMI; myocarditis; pericarditis; pulmonary embolism; aortic dissection; pneumothorax; pneumonia; gastritis; musculoskeletal pain  Presents to the emergency department for evaluation of chest pain.  Patient reports that pain woke her from sleep.  Pain was initially sharp in nature, located in the center of the chest.  Pain is then migrated, more into the right side, had some tenderness in the right upper quadrant on exam.  Patient without prior cardiac history.  Patient is EKG without ischemic changes, no ST elevations.  Mild tachycardia noted, no other abnormality.  Serial high-sensitivity troponins are <15.  Pain atypical for cardiac etiology, cardiac workup very reassuring.  Patient's LFTs, lipase normal.  Administered analgesia.  Upon recheck, all symptoms improved.  Patient resting comfortably, no complaints.  Discussed possible gallbladder etiology.  I do not have ultrasound available tonight.  I did counsel the patient that CT is not the best modality to look for gallbladder problems, gave her the option of doing the CT versus following up with primary care and getting the appropriate test, outpatient ultrasound.  I suspect that this was more related to her  reflux.  Will increase her GERD regimen, have her call her primary doctor Monday for repeat.  If her symptoms worsen she can return to the ED for repeat evaluation.     Final diagnoses:  Chest pain, unspecified type  Pain of upper abdomen    ED Discharge Orders          Ordered    sucralfate  (CARAFATE ) 1 g tablet  3 times daily with meals & bedtime        06/28/24 0355    famotidine  (PEPCID ) 20 MG tablet  2 times daily        06/28/24 0355               Haze Lonni PARAS, MD 06/28/24 [phone]  "

## 2024-06-29 ENCOUNTER — Other Ambulatory Visit (HOSPITAL_BASED_OUTPATIENT_CLINIC_OR_DEPARTMENT_OTHER): Payer: Self-pay

## 2024-06-29 ENCOUNTER — Encounter: Payer: Self-pay | Admitting: Hematology

## 2024-07-02 ENCOUNTER — Telehealth: Payer: Self-pay

## 2024-07-02 NOTE — Telephone Encounter (Signed)
 Transition Care Management Unsuccessful Follow-up Telephone Call  Date of discharge and from where:  06/28/24; [facility]  Attempts:  1st Attempt  Reason for unsuccessful TCM follow-up call:  Left voice message; LVM for patient to complete TOC call and schedule for ED follow up with PCP. Advised to call our office to schedule this appointment. If pt returns call please schedule pt accordingly.

## 2024-07-04 ENCOUNTER — Encounter: Payer: Self-pay | Admitting: Hematology

## 2024-07-07 ENCOUNTER — Ambulatory Visit: Admitting: Family

## 2024-07-07 ENCOUNTER — Encounter: Payer: Self-pay | Admitting: Family

## 2024-07-07 ENCOUNTER — Encounter: Payer: Self-pay | Admitting: Hematology

## 2024-07-07 VITALS — BP 112/79 | HR 94 | Temp 97.4°F | Ht 63.0 in | Wt 220.2 lb

## 2024-07-07 DIAGNOSIS — R1011 Right upper quadrant pain: Secondary | ICD-10-CM

## 2024-07-07 DIAGNOSIS — R748 Abnormal levels of other serum enzymes: Secondary | ICD-10-CM

## 2024-07-07 LAB — HEPATIC FUNCTION PANEL
ALT: 19 U/L (ref 3–35)
AST: 19 U/L (ref 5–37)
Albumin: 4.5 g/dL (ref 3.5–5.2)
Alkaline Phosphatase: 50 U/L (ref 39–117)
Bilirubin, Direct: 0.1 mg/dL (ref 0.1–0.3)
Total Bilirubin: 0.4 mg/dL (ref 0.2–1.2)
Total Protein: 7.4 g/dL (ref 6.0–8.3)

## 2024-07-07 MED ORDER — ESTRADIOL 0.5 MG PO TABS: 0.5000 mg | ORAL_TABLET | Freq: Every day | ORAL | Status: DC

## 2024-07-07 NOTE — Progress Notes (Signed)
 "  Patient ID: TAUHEEDAH BOK, female    DOB: [birth year], 44 y.o.   MRN: [id number]  Chief Complaint  Patient presents with   Follow-up    Pt was seen in ED on 06/27/2024 for chest pain and abdominal pain. Has tried Famotidine  and Sucralfate     Discussed the use of AI scribe software for clinical note transcription with the patient, who gave verbal consent to proceed.  History of Present Illness Bonnie Larsen is a 44 year old female who presents with severe epigastric pain radiating to the back.  She had a sudden onset of sharp epigastric pain while asleep, radiating across the abdomen and to the back, described as feeling like being punched under the rib cage and severe enough to make her sit up in bed. The pain worsened with standing and walking and did not improve with Tums or Gas-X, leading her to go to the ER. She last ate around 4 PM that day. She did not have her typical heartburn burning sensation. She had nausea, especially on the way to the hospital, but no vomiting. She has been taking Protonix  daily. She received famotidine  and carafate  in the ER and has since stopped it. ER labs showed mildly elevated liver enzymes. She has been taking Mounjaro  10mg  weekly. She has never had similar episodes and has not seen a gastroenterologist before, though she was once referred for a colonoscopy.  Assessment & Plan Epigastric and right upper quadrant pain Acute pain likely due to indigestion or biliary issues, possibly triggered by recent greasy or acidic food intake. Reminder that Mounjaro  can increase indigestion or reflux pain. Differential includes gallstones or gallbladder dysfunction. - Continue Protonix  daily, consider increasing to twice daily if symptoms persist. - Discontinued famotidine  & carafate . - Avoid greasy and fatty foods. - Avoid eating two hours before lying down.  Elevated liver enzymes Liver enzymes elevated, possibly due to medication (Mounjaro ) or gallstones. No  immediate imaging due to symptom improvement. - Recheck liver enzymes today. - If liver enzymes remain elevated, or pain returns, consider abdominal ultrasound to evaluate gallbladder and liver.  General health maintenance Colonoscopy scheduled for further evaluation, unrelated to current symptoms. - Ensure completion of scheduled colonoscopy.  Subjective:    Outpatient Medications Prior to Visit  Medication Sig Dispense Refill   baclofen  (LIORESAL ) 10 MG tablet Take 1-2 tablets (10-20 mg total) by mouth at bedtime as needed for muscle spasms. 60 each 0   estradiol  (ESTRACE ) 0.5 MG tablet Take 1 tablet (0.5 mg total) by mouth daily. 90 tablet 1   famotidine  (PEPCID ) 20 MG tablet Take 1 tablet (20 mg total) by mouth 2 (two) times daily. 30 tablet 0   fluticasone  (FLONASE ) 50 MCG/ACT nasal spray Place 2 sprays into both nostrils daily. 16 g 6   levocetirizine (XYZAL  ALLERGY 24HR) 5 MG tablet Take 1 tablet (5 mg total) by mouth every evening. 30 tablet 3   levonorgestrel  (MIRENA ) 20 MCG/24HR IUD 1 each by Intrauterine route once.     MAGNESIUM PO Take by mouth.     metFORMIN  (GLUCOPHAGE -XR) 500 MG 24 hr tablet Take 1 tablet (500 mg total) by mouth every evening. 90 tablet 0   Multiple Vitamin (MULTIVITAMIN) tablet Take 1 tablet by mouth daily.     naproxen  (NAPROSYN ) 500 MG tablet Take 1 tablet (500 mg total) by mouth 2 (two) times daily with a meal. Take for 5 days then as needed. 30 tablet 0   ondansetron  (ZOFRAN ) 4 MG tablet  TAKE 1 TABLET BY MOUTH EVERY 8 HOURS AS NEEDED FOR NAUSEA OR VOMITING 20 tablet 0   pantoprazole  (PROTONIX ) 20 MG tablet Take 1 tablet (20 mg total) by mouth daily. Take twice a day for a week to see if helps cough, then reduce to daily. 60 tablet 5   POTASSIUM PO Take by mouth.     progesterone  (PROMETRIUM ) 100 MG capsule Take 1 capsule (100 mg total) by mouth daily. 90 capsule 0   Cholecalciferol (VITAMIN D3) 1.25 MG (50000 UT) CAPS Take 1 capsule by mouth once a week  12 capsule 0   estradiol  (ESTRACE ) 0.5 MG tablet Take 1 tablet (0.5 mg total) by mouth daily. 90 tablet 0   estradiol  (ESTRACE ) 0.5 MG tablet Take 1 tablet (0.5 mg total) by mouth daily. 90 tablet 0   hydrochlorothiazide  (HYDRODIURIL ) 12.5 MG tablet Take 1 tablet (12.5 mg total) by mouth daily as needed. 90 tablet 1   progesterone  (PROMETRIUM ) 100 MG capsule Take 1 capsule (100 mg total) by mouth daily. 90 capsule 1   progesterone  (PROMETRIUM ) 200 MG capsule Take 1 capsule (200 mg total) by mouth at bedtime. 90 capsule 0   progesterone  (PROMETRIUM ) 200 MG capsule Take 1 capsule (200 mg total) by mouth at bedtime. 90 capsule 0   progesterone  (PROMETRIUM ) 200 MG capsule Take 1 capsule (200 mg total) by mouth at bedtime. 90 capsule 1   propranolol  (INDERAL ) 10 MG tablet Take 1 tablet (10 mg total) by mouth 2 (two) times daily as needed (For anxiety). 30 tablet 0   sucralfate  (CARAFATE ) 1 g tablet Take 1 tablet (1 g total) by mouth 4 (four) times daily -  with meals and at bedtime. 40 tablet 0   tirzepatide  (MOUNJARO ) 10 MG/0.5ML Pen Inject 10 mg into the skin once a week. 2 mL 0   vortioxetine  HBr (TRINTELLIX ) 5 MG TABS tablet Take 1 tablet (5 mg total) by mouth daily after breakfast. 30 tablet 1   Facility-Administered Medications Prior to Visit  Medication Dose Route Frequency Provider Last Rate Last Admin   sodium chloride  flush (NS) 0.9 % injection 10 mL  10 mL Intravenous PRN Kale, Gautam Kishore, MD   10 mL at 04/16/17 1938   Past Medical History:  Diagnosis Date   Adenopathy 01/10/2017   Upper abdominal. Per CT   Anemia    Anxiety    Bacterial pharyngitis 07/30/2016   Counseling regarding advanced care planning and goals of care 06/24/2017   Depression    Difficult intravenous access    Family history of adverse reaction to anesthesia    pt's mother has hx. 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   Seasonal allergies    Past Surgical History:  Procedure Laterality Date   LYMPH NODE BIOPSY N/A 02/11/2017   Procedure: HAND ASSISTED LAPAROSCOPIC LYMPH NODE BIOPSY;  Surgeon: Aron Shoulders, MD;  Location: [facility] OR;  Service: General;  Laterality: N/A;   PORT-A-CATH REMOVAL N/A 09/04/2017   Procedure: REMOVAL PORT-A-CATH;  Surgeon: Aron Shoulders, MD;  Location: [facility] OR;  Service: General;  Laterality: N/A;   PORTACATH PLACEMENT N/A 02/20/2017   Procedure: INSERTION PORT-A-CATH;  Surgeon: Aron Shoulders, MD;  Location: [facility];  Service: General;  Laterality: N/A;   TUBAL LIGATION     UMBILICAL HERNIA REPAIR  02/11/2017   Allergies[1]    Objective:    Physical Exam Vitals and nursing note reviewed.  Constitutional:      Appearance: Normal appearance. She is obese.  Cardiovascular:     Rate and Rhythm: Normal rate and regular rhythm.  Pulmonary:     Effort: Pulmonary effort is normal.     Breath sounds: Normal breath sounds.  Musculoskeletal:        General: Normal range of motion.  Skin:    General: Skin is warm and dry.  Neurological:     Mental Status: She is alert.  Psychiatric:        Mood and Affect: Mood normal.        Behavior: Behavior normal.    BP 112/79 (BP Location: Left Arm, Patient Position: Sitting, Cuff Size: Normal)   Pulse 94   Temp (!) 97.4 F (36.3 C) (Temporal)   Ht 5' 3 (1.6 m)   Wt 220 lb 4 oz (99.9 kg)   LMP  (LMP Unknown)   SpO2 100%   BMI 39.02 kg/m  Wt Readings from Last 3 Encounters:  07/07/24 220 lb 4 oz (99.9 kg)  06/27/24 218 lb (98.9 kg)  06/24/24 218 lb (98.9 kg)      Bonnie Larsen,  Bonnie Piscitello, NP     [1]  Allergies Allergen Reactions   Clindamycin /Lincomycin Swelling    FACIAL SWELLING   Morphine     Cymbalta  [Duloxetine  Hcl] Nausea Only    Causes pt to have nausea and stomach cramping.   "

## 2024-07-08 ENCOUNTER — Ambulatory Visit: Payer: Self-pay | Admitting: Family

## 2024-07-11 ENCOUNTER — Other Ambulatory Visit: Payer: Self-pay | Admitting: Family

## 2024-07-11 ENCOUNTER — Other Ambulatory Visit (HOSPITAL_BASED_OUTPATIENT_CLINIC_OR_DEPARTMENT_OTHER): Payer: Self-pay

## 2024-07-11 DIAGNOSIS — F32A Depression, unspecified: Secondary | ICD-10-CM

## 2024-07-12 ENCOUNTER — Other Ambulatory Visit: Payer: Self-pay

## 2024-07-13 ENCOUNTER — Other Ambulatory Visit (HOSPITAL_BASED_OUTPATIENT_CLINIC_OR_DEPARTMENT_OTHER): Payer: Self-pay

## 2024-07-13 ENCOUNTER — Other Ambulatory Visit: Payer: Self-pay

## 2024-07-13 MED ORDER — PROPRANOLOL HCL 10 MG PO TABS
10.0000 mg | ORAL_TABLET | Freq: Two times a day (BID) | ORAL | 0 refills | Status: DC | PRN
  Filled 2024-07-13: qty 30, 15d supply, fill #0

## 2024-07-14 ENCOUNTER — Encounter: Payer: Self-pay | Admitting: Gastroenterology

## 2024-07-14 ENCOUNTER — Other Ambulatory Visit (HOSPITAL_BASED_OUTPATIENT_CLINIC_OR_DEPARTMENT_OTHER): Payer: Self-pay

## 2024-07-14 MED ORDER — ESTRADIOL 0.5 MG PO TABS
0.5000 mg | ORAL_TABLET | Freq: Every day | ORAL | 1 refills | Status: AC
  Filled 2024-07-14 – 2024-07-26 (×2): qty 30, 30d supply, fill #0

## 2024-07-15 ENCOUNTER — Ambulatory Visit: Payer: 59 | Admitting: Hematology

## 2024-07-15 ENCOUNTER — Other Ambulatory Visit: Payer: 59

## 2024-07-24 ENCOUNTER — Other Ambulatory Visit (HOSPITAL_BASED_OUTPATIENT_CLINIC_OR_DEPARTMENT_OTHER): Payer: Self-pay

## 2024-07-26 ENCOUNTER — Other Ambulatory Visit: Payer: Self-pay | Admitting: Family

## 2024-07-26 ENCOUNTER — Other Ambulatory Visit: Payer: Self-pay

## 2024-07-26 ENCOUNTER — Other Ambulatory Visit (HOSPITAL_COMMUNITY): Payer: Self-pay

## 2024-07-26 DIAGNOSIS — E10A2 Type 1 diabetes mellitus, presymptomatic, stage 2: Secondary | ICD-10-CM

## 2024-07-26 DIAGNOSIS — E119 Type 2 diabetes mellitus without complications: Secondary | ICD-10-CM

## 2024-07-26 DIAGNOSIS — M25551 Pain in right hip: Secondary | ICD-10-CM

## 2024-07-26 DIAGNOSIS — F32A Depression, unspecified: Secondary | ICD-10-CM

## 2024-07-27 ENCOUNTER — Other Ambulatory Visit: Payer: Self-pay

## 2024-07-27 ENCOUNTER — Other Ambulatory Visit (HOSPITAL_COMMUNITY): Payer: Self-pay

## 2024-07-27 MED ORDER — NAPROXEN 500 MG PO TABS
500.0000 mg | ORAL_TABLET | Freq: Two times a day (BID) | ORAL | 0 refills | Status: AC
  Filled 2024-07-27: qty 30, 15d supply, fill #0

## 2024-07-27 MED ORDER — METFORMIN HCL ER 500 MG PO TB24
500.0000 mg | ORAL_TABLET | Freq: Every evening | ORAL | 0 refills | Status: AC
  Filled 2024-07-27: qty 30, 30d supply, fill #0

## 2024-07-27 MED ORDER — MOUNJARO 10 MG/0.5ML ~~LOC~~ SOAJ
10.0000 mg | SUBCUTANEOUS | 0 refills | Status: AC
  Filled 2024-07-27: qty 2, 28d supply, fill #0

## 2024-07-28 ENCOUNTER — Other Ambulatory Visit: Payer: Self-pay

## 2024-07-28 ENCOUNTER — Other Ambulatory Visit (HOSPITAL_BASED_OUTPATIENT_CLINIC_OR_DEPARTMENT_OTHER): Payer: Self-pay

## 2024-07-28 ENCOUNTER — Ambulatory Visit

## 2024-07-28 ENCOUNTER — Other Ambulatory Visit (HOSPITAL_COMMUNITY): Payer: Self-pay

## 2024-07-28 ENCOUNTER — Encounter: Payer: Self-pay | Admitting: Gastroenterology

## 2024-07-28 VITALS — Ht 63.0 in | Wt 215.0 lb

## 2024-07-28 DIAGNOSIS — Z1211 Encounter for screening for malignant neoplasm of colon: Secondary | ICD-10-CM

## 2024-07-28 MED ORDER — NA SULFATE-K SULFATE-MG SULF 17.5-3.13-1.6 GM/177ML PO SOLN
1.0000 | Freq: Once | ORAL | 0 refills | Status: AC
  Filled 2024-07-28 (×2): qty 354, 1d supply, fill #0

## 2024-07-28 NOTE — Progress Notes (Signed)

## 2024-07-29 ENCOUNTER — Other Ambulatory Visit: Payer: Self-pay | Admitting: Family

## 2024-07-29 ENCOUNTER — Other Ambulatory Visit (HOSPITAL_COMMUNITY): Payer: Self-pay

## 2024-07-29 ENCOUNTER — Other Ambulatory Visit: Payer: Self-pay

## 2024-07-29 ENCOUNTER — Encounter: Payer: Self-pay | Admitting: Hematology

## 2024-07-29 DIAGNOSIS — F419 Anxiety disorder, unspecified: Secondary | ICD-10-CM

## 2024-07-30 ENCOUNTER — Other Ambulatory Visit: Payer: Self-pay

## 2024-07-30 ENCOUNTER — Other Ambulatory Visit (HOSPITAL_BASED_OUTPATIENT_CLINIC_OR_DEPARTMENT_OTHER): Payer: Self-pay

## 2024-07-30 MED ORDER — PROPRANOLOL HCL 10 MG PO TABS
10.0000 mg | ORAL_TABLET | Freq: Two times a day (BID) | ORAL | 0 refills | Status: AC | PRN
  Filled 2024-07-30: qty 60, 30d supply, fill #0

## 2024-08-12 ENCOUNTER — Encounter: Admitting: Gastroenterology

## 2025-04-23 ENCOUNTER — Inpatient Hospital Stay: Admitting: Hematology

## 2025-04-23 ENCOUNTER — Inpatient Hospital Stay
